# Patient Record
Sex: Male | Born: 1965 | Race: White | Hispanic: No | Marital: Married | Smoking: Never smoker
Health system: Southern US, Community
[De-identification: ages and names within clinical notes are randomized; demographics above are authoritative.]

## PROBLEM LIST (undated history)

## (undated) DIAGNOSIS — I10 Essential (primary) hypertension: Secondary | ICD-10-CM

## (undated) DIAGNOSIS — R569 Unspecified convulsions: Secondary | ICD-10-CM

## (undated) DIAGNOSIS — T6701XA Heatstroke and sunstroke, initial encounter: Secondary | ICD-10-CM

## (undated) DIAGNOSIS — Q231 Congenital insufficiency of aortic valve: Secondary | ICD-10-CM

## (undated) DIAGNOSIS — I4891 Unspecified atrial fibrillation: Secondary | ICD-10-CM

## (undated) DIAGNOSIS — C61 Malignant neoplasm of prostate: Secondary | ICD-10-CM

## (undated) DIAGNOSIS — E785 Hyperlipidemia, unspecified: Secondary | ICD-10-CM

## (undated) DIAGNOSIS — F329 Major depressive disorder, single episode, unspecified: Secondary | ICD-10-CM

## (undated) DIAGNOSIS — C801 Malignant (primary) neoplasm, unspecified: Secondary | ICD-10-CM

## (undated) DIAGNOSIS — F32A Depression, unspecified: Secondary | ICD-10-CM

## (undated) DIAGNOSIS — M1990 Unspecified osteoarthritis, unspecified site: Secondary | ICD-10-CM

## (undated) DIAGNOSIS — F419 Anxiety disorder, unspecified: Secondary | ICD-10-CM

## (undated) DIAGNOSIS — Q2381 Bicuspid aortic valve: Secondary | ICD-10-CM

## (undated) DIAGNOSIS — K219 Gastro-esophageal reflux disease without esophagitis: Secondary | ICD-10-CM

## (undated) HISTORY — DX: Anxiety disorder, unspecified: F41.9

## (undated) HISTORY — DX: Bicuspid aortic valve: Q23.81

## (undated) HISTORY — PX: OTHER SURGICAL HISTORY: SHX169

## (undated) HISTORY — DX: Essential (primary) hypertension: I10

## (undated) HISTORY — DX: Gastro-esophageal reflux disease without esophagitis: K21.9

## (undated) HISTORY — DX: Unspecified convulsions: R56.9

## (undated) HISTORY — DX: Major depressive disorder, single episode, unspecified: F32.9

## (undated) HISTORY — PX: TOTAL KNEE ARTHROPLASTY: SHX125

## (undated) HISTORY — DX: Unspecified atrial fibrillation: I48.91

## (undated) HISTORY — DX: Depression, unspecified: F32.A

## (undated) HISTORY — DX: Congenital insufficiency of aortic valve: Q23.1

## (undated) HISTORY — DX: Hyperlipidemia, unspecified: E78.5

## (undated) HISTORY — DX: Heatstroke and sunstroke, initial encounter: T67.01XA

## (undated) HISTORY — PX: JOINT REPLACEMENT: SHX530

## (undated) HISTORY — DX: Unspecified osteoarthritis, unspecified site: M19.90

---

## 1987-08-07 DIAGNOSIS — I4891 Unspecified atrial fibrillation: Secondary | ICD-10-CM

## 1987-08-07 HISTORY — PX: CARDIOVERSION: SHX1299

## 1987-08-07 HISTORY — DX: Unspecified atrial fibrillation: I48.91

## 1996-08-06 DIAGNOSIS — R569 Unspecified convulsions: Secondary | ICD-10-CM

## 1996-08-06 DIAGNOSIS — T6701XA Heatstroke and sunstroke, initial encounter: Secondary | ICD-10-CM

## 1996-08-06 HISTORY — DX: Unspecified convulsions: R56.9

## 1996-08-06 HISTORY — DX: Heatstroke and sunstroke, initial encounter: T67.01XA

## 2004-07-07 ENCOUNTER — Emergency Department (HOSPITAL_COMMUNITY): Admission: EM | Admit: 2004-07-07 | Discharge: 2004-07-07 | Payer: Self-pay | Admitting: Family Medicine

## 2004-09-14 ENCOUNTER — Ambulatory Visit: Payer: Self-pay | Admitting: Internal Medicine

## 2005-08-30 ENCOUNTER — Ambulatory Visit: Payer: Self-pay | Admitting: Internal Medicine

## 2006-05-14 ENCOUNTER — Ambulatory Visit: Payer: Self-pay | Admitting: Internal Medicine

## 2006-05-15 ENCOUNTER — Ambulatory Visit: Payer: Self-pay | Admitting: Internal Medicine

## 2006-05-21 ENCOUNTER — Ambulatory Visit: Payer: Self-pay | Admitting: Internal Medicine

## 2006-07-24 ENCOUNTER — Ambulatory Visit: Payer: Self-pay | Admitting: Internal Medicine

## 2006-12-23 ENCOUNTER — Telehealth (INDEPENDENT_AMBULATORY_CARE_PROVIDER_SITE_OTHER): Payer: Self-pay | Admitting: *Deleted

## 2007-03-07 ENCOUNTER — Telehealth: Payer: Self-pay | Admitting: Internal Medicine

## 2007-03-26 ENCOUNTER — Ambulatory Visit: Payer: Self-pay

## 2007-03-26 ENCOUNTER — Encounter: Payer: Self-pay | Admitting: Internal Medicine

## 2007-04-14 ENCOUNTER — Encounter: Payer: Self-pay | Admitting: Internal Medicine

## 2007-04-25 ENCOUNTER — Ambulatory Visit: Payer: Self-pay | Admitting: Internal Medicine

## 2007-04-25 DIAGNOSIS — M109 Gout, unspecified: Secondary | ICD-10-CM | POA: Insufficient documentation

## 2007-04-25 DIAGNOSIS — I10 Essential (primary) hypertension: Secondary | ICD-10-CM | POA: Insufficient documentation

## 2007-04-25 DIAGNOSIS — I4891 Unspecified atrial fibrillation: Secondary | ICD-10-CM | POA: Insufficient documentation

## 2007-08-04 DIAGNOSIS — E785 Hyperlipidemia, unspecified: Secondary | ICD-10-CM | POA: Insufficient documentation

## 2007-08-04 DIAGNOSIS — F39 Unspecified mood [affective] disorder: Secondary | ICD-10-CM | POA: Insufficient documentation

## 2007-08-13 ENCOUNTER — Ambulatory Visit: Payer: Self-pay | Admitting: Internal Medicine

## 2007-09-16 ENCOUNTER — Telehealth (INDEPENDENT_AMBULATORY_CARE_PROVIDER_SITE_OTHER): Payer: Self-pay | Admitting: *Deleted

## 2007-09-26 ENCOUNTER — Telehealth (INDEPENDENT_AMBULATORY_CARE_PROVIDER_SITE_OTHER): Payer: Self-pay | Admitting: *Deleted

## 2007-09-29 ENCOUNTER — Ambulatory Visit: Payer: Self-pay | Admitting: Internal Medicine

## 2008-08-11 ENCOUNTER — Ambulatory Visit: Payer: Self-pay | Admitting: Family Medicine

## 2008-10-08 ENCOUNTER — Telehealth: Payer: Self-pay | Admitting: Internal Medicine

## 2008-10-18 ENCOUNTER — Ambulatory Visit: Payer: Self-pay | Admitting: Family Medicine

## 2008-10-19 LAB — CONVERTED CEMR LAB
Albumin: 4.1 g/dL (ref 3.5–5.2)
H Pylori IgG: NEGATIVE
Lipase: 20 units/L (ref 11.0–59.0)

## 2008-10-22 ENCOUNTER — Encounter: Payer: Self-pay | Admitting: Family Medicine

## 2008-10-25 ENCOUNTER — Encounter: Payer: Self-pay | Admitting: Family Medicine

## 2008-10-25 ENCOUNTER — Ambulatory Visit: Payer: Self-pay | Admitting: Family Medicine

## 2008-10-29 ENCOUNTER — Ambulatory Visit: Payer: Self-pay | Admitting: Family Medicine

## 2008-11-04 ENCOUNTER — Ambulatory Visit: Payer: Self-pay | Admitting: Internal Medicine

## 2008-11-04 DIAGNOSIS — K7689 Other specified diseases of liver: Secondary | ICD-10-CM | POA: Insufficient documentation

## 2008-11-04 LAB — CONVERTED CEMR LAB
Albumin: 4.2 g/dL (ref 3.5–5.2)
Alkaline Phosphatase: 50 units/L (ref 39–117)
BUN: 16 mg/dL (ref 6–23)
CO2: 32 meq/L (ref 19–32)
Calcium: 9.6 mg/dL (ref 8.4–10.5)
Cholesterol: 276 mg/dL — ABNORMAL HIGH (ref 0–200)
Creatinine, Ser: 1.2 mg/dL (ref 0.4–1.5)
Glucose, Bld: 99 mg/dL (ref 70–99)
Hep B C IgM: NEGATIVE
Sodium: 140 meq/L (ref 135–145)
Total Protein: 7.6 g/dL (ref 6.0–8.3)
Triglycerides: 453 mg/dL — ABNORMAL HIGH (ref 0.0–149.0)

## 2008-11-05 ENCOUNTER — Inpatient Hospital Stay (HOSPITAL_COMMUNITY): Admission: EM | Admit: 2008-11-05 | Discharge: 2008-11-13 | Payer: Self-pay | Admitting: Nurse Practitioner

## 2008-11-08 ENCOUNTER — Telehealth: Payer: Self-pay | Admitting: Internal Medicine

## 2009-08-06 DIAGNOSIS — I428 Other cardiomyopathies: Secondary | ICD-10-CM | POA: Insufficient documentation

## 2009-08-07 ENCOUNTER — Inpatient Hospital Stay: Payer: Self-pay | Admitting: Internal Medicine

## 2009-08-07 ENCOUNTER — Ambulatory Visit: Payer: Self-pay | Admitting: Internal Medicine

## 2009-08-08 ENCOUNTER — Encounter: Payer: Self-pay | Admitting: Internal Medicine

## 2009-08-09 ENCOUNTER — Encounter: Payer: Self-pay | Admitting: Internal Medicine

## 2009-08-10 ENCOUNTER — Encounter: Payer: Self-pay | Admitting: Internal Medicine

## 2009-08-12 ENCOUNTER — Ambulatory Visit: Payer: Self-pay | Admitting: Internal Medicine

## 2009-08-12 LAB — CONVERTED CEMR LAB: INR: 1.2

## 2009-08-18 ENCOUNTER — Encounter: Payer: Self-pay | Admitting: Cardiology

## 2009-08-18 ENCOUNTER — Ambulatory Visit: Payer: Self-pay | Admitting: Internal Medicine

## 2009-08-19 ENCOUNTER — Encounter: Payer: Self-pay | Admitting: Internal Medicine

## 2009-08-19 LAB — CONVERTED CEMR LAB
INR: 1.21 (ref <1.50)
Prothrombin Time: 15.2 s

## 2009-08-22 ENCOUNTER — Telehealth: Payer: Self-pay | Admitting: Internal Medicine

## 2009-08-26 ENCOUNTER — Ambulatory Visit: Payer: Self-pay | Admitting: Internal Medicine

## 2009-09-05 ENCOUNTER — Encounter: Payer: Self-pay | Admitting: Internal Medicine

## 2009-09-05 ENCOUNTER — Ambulatory Visit: Payer: Self-pay | Admitting: Internal Medicine

## 2009-09-05 ENCOUNTER — Encounter: Payer: Self-pay | Admitting: Cardiovascular Disease

## 2009-09-05 LAB — CONVERTED CEMR LAB
BUN: 15 mg/dL (ref 6–23)
Chloride: 102 meq/L (ref 96–112)
Glucose, Bld: 90 mg/dL (ref 70–99)
INR: 1.16
Potassium: 4.4 meq/L (ref 3.5–5.3)

## 2009-09-08 ENCOUNTER — Ambulatory Visit: Payer: Self-pay | Admitting: Internal Medicine

## 2009-09-10 LAB — CONVERTED CEMR LAB
Albumin: 4.6 g/dL (ref 3.5–5.2)
Alkaline Phosphatase: 55 units/L (ref 39–117)
BUN: 16 mg/dL (ref 6–23)
Bilirubin, Direct: 0.1 mg/dL (ref 0.0–0.3)
Calcium: 9.6 mg/dL (ref 8.4–10.5)
Creatinine, Ser: 1.1 mg/dL (ref 0.4–1.5)
Glucose, Bld: 79 mg/dL (ref 70–99)
Sodium: 139 meq/L (ref 135–145)
Total Bilirubin: 1 mg/dL (ref 0.3–1.2)
Total CHOL/HDL Ratio: 5
VLDL: 59.6 mg/dL — ABNORMAL HIGH (ref 0.0–40.0)

## 2009-09-16 ENCOUNTER — Ambulatory Visit: Payer: Self-pay | Admitting: Cardiovascular Disease

## 2009-09-16 ENCOUNTER — Encounter: Payer: Self-pay | Admitting: Cardiovascular Disease

## 2009-09-22 ENCOUNTER — Ambulatory Visit: Payer: Self-pay | Admitting: Internal Medicine

## 2009-09-30 ENCOUNTER — Ambulatory Visit: Payer: Self-pay | Admitting: Cardiovascular Disease

## 2009-10-01 ENCOUNTER — Encounter: Payer: Self-pay | Admitting: Internal Medicine

## 2009-10-03 ENCOUNTER — Encounter: Payer: Self-pay | Admitting: Cardiovascular Disease

## 2009-10-04 ENCOUNTER — Ambulatory Visit: Payer: Self-pay

## 2009-10-04 LAB — CONVERTED CEMR LAB
INR: 1.53
Prothrombin Time: 18.3 s

## 2009-10-07 ENCOUNTER — Telehealth: Payer: Self-pay | Admitting: Internal Medicine

## 2009-10-10 ENCOUNTER — Ambulatory Visit: Payer: Self-pay | Admitting: Internal Medicine

## 2009-10-10 ENCOUNTER — Encounter: Payer: Self-pay | Admitting: Internal Medicine

## 2009-10-24 ENCOUNTER — Ambulatory Visit: Payer: Self-pay | Admitting: Internal Medicine

## 2009-10-24 ENCOUNTER — Encounter: Payer: Self-pay | Admitting: Cardiovascular Disease

## 2009-10-25 LAB — CONVERTED CEMR LAB
INR: 2.19 — ABNORMAL HIGH (ref <1.50)
Prothrombin Time: 24.2 s — ABNORMAL HIGH (ref 11.6–15.2)

## 2009-11-09 ENCOUNTER — Encounter: Payer: Self-pay | Admitting: Internal Medicine

## 2009-11-09 ENCOUNTER — Ambulatory Visit: Payer: Self-pay | Admitting: Cardiovascular Disease

## 2009-11-30 ENCOUNTER — Telehealth: Payer: Self-pay | Admitting: Internal Medicine

## 2009-12-12 ENCOUNTER — Ambulatory Visit: Payer: Self-pay | Admitting: Internal Medicine

## 2009-12-12 ENCOUNTER — Encounter: Payer: Self-pay | Admitting: Internal Medicine

## 2009-12-13 LAB — CONVERTED CEMR LAB
INR: 1.76 — ABNORMAL HIGH (ref <1.50)
POC INR: 1.76
POC INR: 1.76
Prothrombin Time: 20.4 s — ABNORMAL HIGH (ref 11.6–15.2)

## 2010-01-17 ENCOUNTER — Encounter: Payer: Self-pay | Admitting: Internal Medicine

## 2010-01-17 ENCOUNTER — Ambulatory Visit: Payer: Self-pay

## 2010-01-24 ENCOUNTER — Ambulatory Visit: Payer: Self-pay | Admitting: Internal Medicine

## 2010-01-24 DIAGNOSIS — R079 Chest pain, unspecified: Secondary | ICD-10-CM | POA: Insufficient documentation

## 2010-01-24 DIAGNOSIS — R061 Stridor: Secondary | ICD-10-CM | POA: Insufficient documentation

## 2010-01-25 ENCOUNTER — Encounter: Payer: Self-pay | Admitting: Cardiovascular Disease

## 2010-01-30 ENCOUNTER — Encounter: Payer: Self-pay | Admitting: Internal Medicine

## 2010-01-30 ENCOUNTER — Telehealth: Payer: Self-pay | Admitting: Cardiovascular Disease

## 2010-02-09 ENCOUNTER — Telehealth (INDEPENDENT_AMBULATORY_CARE_PROVIDER_SITE_OTHER): Payer: Self-pay | Admitting: *Deleted

## 2010-02-13 ENCOUNTER — Encounter: Payer: Self-pay | Admitting: Internal Medicine

## 2010-02-13 ENCOUNTER — Encounter: Payer: Self-pay | Admitting: Cardiology

## 2010-02-13 ENCOUNTER — Ambulatory Visit: Payer: Self-pay | Admitting: Cardiology

## 2010-02-13 ENCOUNTER — Ambulatory Visit: Payer: Self-pay

## 2010-02-13 ENCOUNTER — Encounter (HOSPITAL_COMMUNITY): Admission: RE | Admit: 2010-02-13 | Discharge: 2010-04-12 | Payer: Self-pay | Admitting: Internal Medicine

## 2010-02-20 ENCOUNTER — Ambulatory Visit: Payer: Self-pay | Admitting: Cardiovascular Disease

## 2010-03-01 ENCOUNTER — Encounter: Payer: Self-pay | Admitting: Cardiovascular Disease

## 2010-03-01 ENCOUNTER — Ambulatory Visit: Payer: Self-pay | Admitting: Internal Medicine

## 2010-03-02 LAB — CONVERTED CEMR LAB
BUN: 17 mg/dL (ref 6–23)
Creatinine, Ser: 1.09 mg/dL (ref 0.40–1.50)
Glucose, Bld: 85 mg/dL (ref 70–99)
HCT: 39.1 % (ref 39.0–52.0)
MCHC: 33.2 g/dL (ref 30.0–36.0)
MCV: 89.9 fL (ref 78.0–100.0)
Platelets: 221 10*3/uL (ref 150–400)
Potassium: 4.2 meq/L (ref 3.5–5.3)
Prothrombin Time: 19.4 s — ABNORMAL HIGH (ref 11.6–15.2)
RDW: 13.3 % (ref 11.5–15.5)

## 2010-03-06 ENCOUNTER — Inpatient Hospital Stay (HOSPITAL_BASED_OUTPATIENT_CLINIC_OR_DEPARTMENT_OTHER): Admission: RE | Admit: 2010-03-06 | Discharge: 2010-03-06 | Payer: Self-pay | Admitting: Cardiovascular Disease

## 2010-03-06 ENCOUNTER — Ambulatory Visit: Payer: Self-pay | Admitting: Cardiovascular Disease

## 2010-03-06 HISTORY — PX: CARDIAC CATHETERIZATION: SHX172

## 2010-03-15 ENCOUNTER — Ambulatory Visit: Payer: Self-pay | Admitting: Cardiovascular Disease

## 2010-03-15 LAB — CONVERTED CEMR LAB: POC INR: 1.7

## 2010-03-28 ENCOUNTER — Ambulatory Visit: Payer: Self-pay | Admitting: Internal Medicine

## 2010-03-28 ENCOUNTER — Ambulatory Visit: Payer: Self-pay | Admitting: Cardiovascular Disease

## 2010-03-28 LAB — CONVERTED CEMR LAB: POC INR: 1.6

## 2010-05-04 ENCOUNTER — Telehealth: Payer: Self-pay | Admitting: Internal Medicine

## 2010-06-13 ENCOUNTER — Ambulatory Visit: Payer: Self-pay | Admitting: Internal Medicine

## 2010-06-13 DIAGNOSIS — K219 Gastro-esophageal reflux disease without esophagitis: Secondary | ICD-10-CM | POA: Insufficient documentation

## 2010-06-15 LAB — CONVERTED CEMR LAB
ALT: 25 units/L (ref 0–53)
AST: 27 units/L (ref 0–37)
Albumin: 4 g/dL (ref 3.5–5.2)
BUN: 17 mg/dL (ref 6–23)
Basophils Absolute: 0 10*3/uL (ref 0.0–0.1)
Bilirubin, Direct: 0.1 mg/dL (ref 0.0–0.3)
Chloride: 104 meq/L (ref 96–112)
Creatinine, Ser: 1 mg/dL (ref 0.4–1.5)
Eosinophils Absolute: 0 10*3/uL (ref 0.0–0.7)
GFR calc non Af Amer: 91.66 mL/min (ref >60)
HCT: 37.7 % — ABNORMAL LOW (ref 39.0–52.0)
Hemoglobin: 13.3 g/dL (ref 13.0–17.0)
Lymphocytes Relative: 24.4 % (ref 12.0–46.0)
Lymphs Abs: 1.4 10*3/uL (ref 0.7–4.0)
MCHC: 35.2 g/dL (ref 30.0–36.0)
Neutro Abs: 3.9 10*3/uL (ref 1.4–7.7)
Phosphorus: 2.5 mg/dL (ref 2.3–4.6)
Platelets: 238 10*3/uL (ref 150.0–400.0)
RDW: 12.8 % (ref 11.5–14.6)
TSH: 2.18 microintl units/mL (ref 0.35–5.50)
Total Bilirubin: 0.8 mg/dL (ref 0.3–1.2)
Total Protein: 6.8 g/dL (ref 6.0–8.3)

## 2010-07-05 ENCOUNTER — Ambulatory Visit: Payer: Self-pay | Admitting: Cardiovascular Disease

## 2010-07-19 ENCOUNTER — Ambulatory Visit: Payer: Self-pay

## 2010-08-09 ENCOUNTER — Ambulatory Visit: Admission: RE | Admit: 2010-08-09 | Discharge: 2010-08-09 | Payer: Self-pay | Source: Home / Self Care

## 2010-08-09 ENCOUNTER — Ambulatory Visit
Admission: RE | Admit: 2010-08-09 | Discharge: 2010-08-09 | Payer: Self-pay | Source: Home / Self Care | Attending: Internal Medicine | Admitting: Internal Medicine

## 2010-08-09 ENCOUNTER — Encounter: Payer: Self-pay | Admitting: Internal Medicine

## 2010-08-09 LAB — CONVERTED CEMR LAB: POC INR: 1.3

## 2010-08-25 ENCOUNTER — Ambulatory Visit: Admission: RE | Admit: 2010-08-25 | Discharge: 2010-08-25 | Payer: Self-pay | Source: Home / Self Care

## 2010-08-25 LAB — CONVERTED CEMR LAB: POC INR: 2.5

## 2010-09-05 NOTE — Letter (Signed)
Summary: Custom - Delinquent Coumadin 1  [facility] at [address]   [place], [place] [address]   Phone: [phone]  Fax: [phone]     January 30, 2010 MRN: [id number]   [facility] [address] [place], [place]  [address]   Dear Mr. Jupiter,  This letter is being sent to you as a reminder that it is necessary for you to get your INR/PT checked regularly so that we can optimize your care.  Our records indicate that you were scheduled to have a test done recently.  As of today, we have not received the results of this test.  It is very important that you have your INR checked.  Please call our office at the number listed above to schedule an appointment at your earliest convenience.    If you have recently had your protime checked or have discontinued this medication, please contact our office at the above phone number to clarify this issue.  Thank you for this prompt attention to this important health care matter.  Sincerely,   [facility]ovascular [facility] Reduction [facility] Team

## 2010-09-05 NOTE — Medication Information (Signed)
Summary: CCR/AMD   Anticoagulant Therapy  Managed by: Charlena Cross, RN, BSN Referring MD: Graciela Husbands PCP: Cindee Salt MD Supervising MD: Tenny Craw MD, Gunnar Fusi Indication 1: Atrial Fibrillation INR RANGE 2.0-3.0           Allergies: No Known Drug Allergies  Anticoagulation Management History:      The patient is taking warfarin and comes in today for a routine follow up visit.  Negative risk factors for bleeding include an age less than 44 years old.  The bleeding index is 'low risk'.  Positive CHADS2 values include History of HTN.  Negative CHADS2 values include Age > 2 years old.  His last INR was 1.21 and today's INR is 1.2.  Anticoagulation responsible provider: Tenny Craw MD, Gunnar Fusi.    Anticoagulation Management Assessment/Plan:      The patient's current anticoagulation dose is Warfarin sodium 5 mg tabs: Use as directed by Anticoagulation Clinic.  The target INR is 2.0-3.0.  The next INR is due 09/06/2009.  Anticoagulation instructions were given to patient.  Results were reviewed/authorized by Charlena Cross, RN, BSN.  He was notified by Charlena Cross, RN, BSN.         Prior Anticoagulation Instructions: coumadin 7.5 mg daily with 5 mg on Tues Thurs Sat.  Current Anticoagulation Instructions: coumadin 7.5 mg daily.  Recheck in 10 days.

## 2010-09-05 NOTE — Assessment & Plan Note (Signed)
Summary: F3M/AMD  Medications Added WARFARIN SODIUM 2.5 MG TABS (WARFARIN SODIUM) take as directed      Allergies Added: NKDA  Visit Type:  Follow-up Primary Provider:  Cindee Salt MD  CC:  no cardiac complaints.  History of Present Illness: [name] is seen following a recent hospitalization for new onset atrial fibrillation at [facility]. At that time he underwent TEE guided cardioversion which was successful. His ejection fraction was 30% or less; there was concern about a bicuspid aortic valve.  A repeat ultrasound last week demonstrated marked improvement in his ejection fraction of 40-45%.  there was  NO evidence of bicuspid aortic valve.  he now comes in in 3 month followup with a recent echo which demonstrated no further improvement in his ejection fraction.  He notes a strong family history of sudden death with his father and grandfather in their early 5s both attributed to "heart attacks". However I should note that his father apparently had his heart rupture" in the setting of an abnormal heart rhythm. The patient does have some nonexertional chest discomfort. He does not have a history of GERD.  he has not lost further weight since the 20 pounds he lost at his last visit Is no longer  Current Medications (verified): 1)  Fluoxetine Hcl 20 Mg Caps (Fluoxetine Hcl) .... 3 Capsule Once A Day 2)  Multivitamins   Tabs (Multiple Vitamin) .... Take 1 Tablet By Mouth Once A Day 3)  Vitamin C 500 Mg  Tabs (Ascorbic Acid) .... Take 1 Tablet By Mouth Once A Day 4)  B-12 500 Mcg Tabs (Cyanocobalamin) .... Take One By Mouth Daily 5)  Lipitor 20 Mg Tabs (Atorvastatin Calcium) .... Take One Tablet By Mouth Daily. 6)  Warfarin Sodium 10 Mg Tabs (Warfarin Sodium) .... Use As Directed By Anticoagulation Clinic 7)  Carvedilol 6.25 Mg Tabs (Carvedilol) .... Take One Tablet By Mouth Twice A Day 8)  Lotensin 10 Mg Tabs (Benazepril Hcl) .... Take 1 Tab By Mouth Daily 9)  Colchicine 0.6 Mg  Tabs (Colchicine) .Marland Kitchen.. 1 By Mouth Once Daily 10)  Warfarin Sodium 2.5 Mg Tabs (Warfarin Sodium) .... Take As Directed  Allergies (verified): No Known Drug Allergies  Past History:  Past Medical History: Last updated: 08/18/2009 Atrial fibrillation--1989 cardiomyopathy ejection fraction 30% January 2011 Depression Gout Hypertension Bicuspid aortic valve--last echo looks trileaflet Hyperlipidemia  Past Surgical History: Last updated: 08/11/07 1990's      Heart, stroke / seizures 1989         Cardioversion  Family History: Last updated: January 31, 2010 Dad died of MI @50  Mom has DM, depression, arthritis No cancer in family CAD in paternal GF and uncles No HTN Brother: Crohn's disease  Social History: Last updated: 2007-08-11 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 HEENT  normal . Neck veins were flat; carotids brisk  and full without bruits. No lymphadenopathy. Back without kyphosis. Lungs clear. Heart sounds regular without murmurs or gallops. PMI nondisplaced. Abdomen soft with active bowel sounds without midline pulsation or hepatomegaly. Femoral pulses and distal pulses intact. Extremities were without clubbing cyanosis or edemaSkin warm and dry. Neurological exam grossly normal    Impression & Recommendations:  Problem # 1:  CHEST PAIN (ICD-786.50)  the patient is having atypical chest pains. In the context of his family  history and his non-resolving cardiomyopathy I think is important to undertake  Myoview scanning.  The following medications were removed from the medication list:    Warfarin Sodium 5 Mg Tabs (Warfarin sodium) .Marland Kitchen... Take as directed by coumadin clinic. His updated medication list for this problem includes:    Warfarin Sodium 10 Mg Tabs (Warfarin sodium) ..... Use as directed by anticoagulation clinic    Carvedilol 6.25 Mg Tabs (Carvedilol) .Marland Kitchen... Take one tablet by mouth twice a day    Lotensin 10 Mg Tabs (Benazepril hcl) .Marland Kitchen... Take 1 tab by mouth daily    Warfarin Sodium 2.5 Mg Tabs (Warfarin sodium) .Marland Kitchen... 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 Will plan to repeat a Myoview scan to see we can elucidate an ischemic contribution. The patient does note that he got very sick [place] with [place]; I wonder whether he might have had a virus that could have contributed to his cardiomyopathy. He also has strong family history of some kind of cardiomyopathic process. Will continue him on his current medications  Will settle lengthy discussion regarding exercise and weight loss. We'll plan to continue his exercise regime although with high rep;  low weight training as opposed to  anaerobic lifting.  We will neeed to check fasting lipids  Orders: Nuclear Stress Test (Nuc Stress Test)  Problem # 3:  ATRIAL FIBRILLATION (ICD-427.31)  the patient had some intercurrent palpitations. There has been no sustained arrhythmia. We had a very lengthy discussion regarding the potential benefits of Pradaxa compared to Coumadin. We will plan to begin this when he returns from his trip to [place] The following medications were removed from the medication list:    Warfarin Sodium 5 Mg Tabs  (Warfarin sodium) .Marland Kitchen... Take as directed by coumadin clinic. His updated medication list for this problem includes:    Warfarin Sodium 10 Mg Tabs (Warfarin sodium) ..... Use as directed by anticoagulation clinic    Carvedilol 6.25 Mg Tabs (Carvedilol) .Marland Kitchen... Take one tablet by mouth twice a day    Warfarin Sodium 2.5 Mg Tabs (Warfarin sodium) .Marland Kitchen... Take as directed  Orders: Nuclear Stress Test (Nuc Stress Test)  Patient Instructions: 1)  Your physician recommends that you continue on your current medications as directed. Please refer to the Current Medication list given to you today. 2)  Your physician has requested that you have an lexiscan myoview.  For further information please visit [email or web address]  Please follow instruction sheet, as given.

## 2010-09-05 NOTE — Cardiovascular Report (Signed)
Summary: Cardiac Cath Other  Cardiac Cath Other   Imported By: Frazier Butt Chriscoe 03/08/2010 15:42:13  _____________________________________________________________________  External Attachment:    Type:   Image     Comment:   External Document

## 2010-09-05 NOTE — Progress Notes (Signed)
Summary: [name]   Phone Note Call from Patient Call back at Home Phone [phone]   Caller: SELF Call For: KLEIN Summary of Call: WOULD LIKE A CALL BACK FROM MELISSA Initial call taken by: Harlon Flor,  November 30, 2009 4:46 PM  Follow-up for Phone Call        answered question about [facility] papers. Follow-up by: Charlena Cross, RN, BSN,  November 30, 2009 4:51 PM

## 2010-09-05 NOTE — Assessment & Plan Note (Signed)
Summary: NEP/AMD  Medications Added LIPITOR 20 MG TABS (ATORVASTATIN CALCIUM) Take one tablet by mouth daily. WARFARIN SODIUM 5 MG TABS (WARFARIN SODIUM) Use as directed by Anticoagulation Clinic LOVENOX 120 MG/0.8ML SOLN (ENOXAPARIN SODIUM) last dose this am METOPROLOL SUCCINATE 50 MG XR24H-TAB (METOPROLOL SUCCINATE) Take one tablet by mouth daily CARVEDILOL 6.25 MG TABS (CARVEDILOL) Take one tablet by mouth twice a day LOTENSIN 10 MG TABS (BENAZEPRIL HCL) take 1/2 tab by mouth daily      Allergies Added: NKDA  Visit Type:  new patient Primary Provider:  Cindee Salt MD  CC:  no complaints and some fatique.  History of Present Illness: [name] is seen in followup for atrial fibrillation associated with a rapid ventricular response and impaired left ventricular function. He underwent TEE guided cardioversion in the beginning of January with restoration of sinus rhythm.  He was discharged on Coumadin and treated also with Lovenox which he stopped this morning. Velocity with a beta blocker. This has been associated with aggravation of his PTSD sleep disturbance as well as significant fatigue.  He was not discharged on an ACE inhibitor because of low blood pressure in the hospital.  He is been back working out.  Current Problems (verified): 1)  Fatty Liver Disease  (ICD-571.8) 2)  Preventive Health Care  (ICD-V70.0) 3)  Hyperlipidemia  (ICD-272.4) 4)  Hypertension  (ICD-401.9) 5)  Gout  (ICD-274.9) 6)  Depression  (ICD-311) 7)  Atrial Fibrillation  (ICD-427.31) 8)  Bicuspid Aortic Valve  (ICD-746.4)  Current Medications (verified): 1)  Fluoxetine Hcl 20 Mg Caps (Fluoxetine Hcl) .... 3 Capsule Once A Day 2)  Multivitamins   Tabs (Multiple Vitamin) .... Take 1 Tablet By Mouth Once A Day 3)  Vitamin C 500 Mg  Tabs (Ascorbic Acid) .... Take 1 Tablet By Mouth Once A Day 4)  B-12 500 Mcg Tabs (Cyanocobalamin) .... Take One By Mouth Daily 5)  Lipitor 20 Mg Tabs (Atorvastatin  Calcium) .... Take One Tablet By Mouth Daily. 6)  Warfarin Sodium 5 Mg Tabs (Warfarin Sodium) .... Use As Directed By Anticoagulation Clinic 7)  Lovenox 120 Mg/0.38ml Soln (Enoxaparin Sodium) .... Last Dose This Am 8)  Metoprolol Succinate 50 Mg Xr24h-Tab (Metoprolol Succinate) .... Take One Tablet By Mouth Daily  Allergies (verified): No Known Drug Allergies  Past History:  Past Surgical History: Last updated: 08/04/2007 1990's      Heart, stroke / seizures 1989         Cardioversion  Past Medical History: Atrial fibrillation--1989 cardiomyopathy ejection fraction 30% January 2011 Depression Gout Hypertension Bicuspid aortic valve--last echo looks trileaflet Hyperlipidemia  Vital Signs:  Patient profile:   45 year old male Height:      69 inches Weight:      250.25 pounds BMI:     37.09 Pulse rate:   68 / minute Pulse rhythm:   regular BP sitting:   122 / 72  (right arm) Cuff size:   large  Vitals Entered By: Mercer Pod (August 18, 2009 9:25 AM)  Physical Exam  General:  The patient was alert and oriented in no acute distress. HEENT Normal.  Neck veins were flat, carotids were brisk.  Lungs were clear.  Heart sounds were regular without murmurs or gallops.  Abdomen was soft with active bowel sounds. There is no clubbing cyanosis. trace peripheral edema Skin Warm and dry exam was normal   EKG  Procedure date:  08/18/2009  Findings:      sinus rhythm at 62  Intervals 0.18/0.09/0.38 T Waves flattened in the laterally  Impression & Recommendations:  Problem # 1:  ATRIAL FIBRILLATION (ICD-427.31) he is status post cardioversion. He appears to be holding sinus rhythm. Is not yet clear whether the atrial fibrillation is primary or secondary to the cardiomyopathy. Given his cardiomyopathy however, anticoagulation for the short-term is appropriate.    Orders: T-Protime, Auto ([id number])  Problem # 2:  CARDIOMYOPATHY, PRIMARY, DILATED POSSIBLY  TACHYCARDIA BE (ICD-425.4)  With his cardiomyopathy, beta blockers and ACE inhibitors are appropriate. His blood pressures recovered we'll begin him on Lotensin at 5 mg a day. Given the intolerance and problems with the metoprolol succinate, we will change it and begin him on carvedilol 6.25 mg twice daily. We'll need to check a metabolic profile which we will do in 2 weeks time. Will plan to repeat his echo cardiogram in about 6 weeks. I will see him then.  We had a lengthy discussion comprising 50% of our 30 minute visit related to appropriate restrictions of pain to her and implications related to his cardiomyopathy. The following medications were removed from the medication list:    Metoprolol Succinate 50 Mg Xr24h-tab (Metoprolol succinate) .Marland Kitchen... Take one tablet by mouth daily His updated medication list for this problem includes:    Warfarin Sodium 5 Mg Tabs (Warfarin sodium) ..... Use as directed by anticoagulation clinic    Lovenox 120 Mg/0.72ml Soln (Enoxaparin sodium) .Marland Kitchen... Last dose this am    Carvedilol 6.25 Mg Tabs (Carvedilol) .Marland Kitchen... Take one tablet by mouth twice a day    Lotensin 10 Mg Tabs (Benazepril hcl) .Marland Kitchen... Take 1/2 tab by mouth daily  Orders: Echocardiogram (Echo)  Problem # 3:  BICUSPID AORTIC VALVE (ICD-746.4) we'll plan to clarify the status of his aortic valve at his next ultrasound. Recommendations regarding antibiotic prophylaxis will be forthcoming The following medications were removed from the medication list:    Metoprolol Succinate 50 Mg Xr24h-tab (Metoprolol succinate) .Marland Kitchen... Take one tablet by mouth daily His updated medication list for this problem includes:    Warfarin Sodium 5 Mg Tabs (Warfarin sodium) ..... Use as directed by anticoagulation clinic    Lovenox 120 Mg/0.52ml Soln (Enoxaparin sodium) .Marland Kitchen... Last dose this am    Carvedilol 6.25 Mg Tabs (Carvedilol) .Marland Kitchen... Take one tablet by mouth twice a day    Lotensin 10 Mg Tabs (Benazepril hcl) .Marland Kitchen... Take 1/2  tab by mouth daily  Patient Instructions: 1)  Your physician recommends that you schedule a follow-up appointment in: 7 weeks 2)  Your physician recommends that you return for lab work in: 2 weeks (bmet) 3)  Your physician has recommended you make the following change in your medication: stop Toprol, start Coreg 6.25 mg twice daily, start lotensin 5 mg daily 4)  Your physician has requested that you have an echocardiogram.  Echocardiography is a painless test that uses sound waves to create images of your heart. It provides your doctor with information about the size and shape of your heart and how well your heart's chambers and valves are working.  This procedure takes approximately one hour. There are no restrictions for this procedure. in 6 weeks. Prescriptions: LOTENSIN 10 MG TABS (BENAZEPRIL HCL) take 1/2 tab by mouth daily  #30 x 6   Entered by:   Charlena Cross, RN, BSN   Authorized by:   Nathen May, MD, [facility]   Signed by:   Charlena Cross, RN, BSN on 08/18/2009   Method used:   Electronically to  [facility] [address] [phone]* (retail)       [address] [place], [place]  [id number]       Ph: [phone]       Fax: [phone]   RxID:   [id number] CARVEDILOL 6.25 MG TABS (CARVEDILOL) Take one tablet by mouth twice a day  #60 x 6   Entered by:   Charlena Cross, RN, BSN   Authorized by:   Nathen May, MD, [facility]   Signed by:   Charlena Cross, RN, BSN on 08/18/2009   Method used:   Electronically to        [facility] [address] [phone]* (retail)       [address] [place], [place]  [id number]       Ph: [phone]       Fax: [phone]   RxID:   [id number]

## 2010-09-05 NOTE — Progress Notes (Signed)
Summary: QUESTIONS   Phone Note Call from Patient Call back at Home Phone [phone]   Caller: SELF Call For: [facility] Summary of Call: PT WOULD LIKE A CALL BACK-HAS QUESTIONS ABOUT MEDS Initial call taken by: Harlon Flor,  October 07, 2009 3:39 PM  Follow-up for Phone Call        insurance would not refill lotensin- placed rx for 1 tab daily even though pt is only taking 1/2 tab.  Follow-up by: Charlena Cross, RN, BSN,  October 07, 2009 3:48 PM

## 2010-09-05 NOTE — Letter (Signed)
[facility]   Imported By: Harlon Flor 08/11/2009 09:31:55  _____________________________________________________________________  External Attachment:    Type:   Image     Comment:   External Document  Appended Document: [facility] atrial fib cardioverted and back in sinus Has follow up with Dr Graciela Husbands

## 2010-09-05 NOTE — Assessment & Plan Note (Signed)
Summary: F7W/AMD      Allergies Added: NKDA  Visit Type:  Follow-up Primary Provider:  Cindee Salt MD  CC:  no complaints. .  History of Present Illness: [name] is seen following a recent hospitalization for new onset atrial fibrillation at [facility]. At that time he underwent TEE guided cardioversion which was successful. His ejection fraction was 30% or less; there was concern about a bicuspid aortic valve.  A repeat ultrasound last week demonstrated marked improvement in his ejection fraction of 40-45%.  there was  NO evidence of bicuspid aortic valve  He has lost intercurrently 20 pounds. He has been working out. He feels better than he has in years.  The patient denies SOB, chest pain, edema or palpitations  His snoring is less  Current Problems (verified): 1)  Cardiomyopathy, Primary, Dilated Possibly Tachycardia Be  (ICD-425.4) 2)  Fatty Liver Disease  (ICD-571.8) 3)  Preventive Health Care  (ICD-V70.0) 4)  Hyperlipidemia  (ICD-272.4) 5)  Hypertension  (ICD-401.9) 6)  Gout  (ICD-274.9) 7)  Depression  (ICD-311) 8)  Atrial Fibrillation  (ICD-427.31) 9)  Bicuspid Aortic Valve  (ICD-746.4)  Current Medications (verified): 1)  Fluoxetine Hcl 20 Mg Caps (Fluoxetine Hcl) .... 3 Capsule Once A Day 2)  Multivitamins   Tabs (Multiple Vitamin) .... Take 1 Tablet By Mouth Once A Day 3)  Vitamin C 500 Mg  Tabs (Ascorbic Acid) .... Take 1 Tablet By Mouth Once A Day 4)  B-12 500 Mcg Tabs (Cyanocobalamin) .... Take One By Mouth Daily 5)  Lipitor 20 Mg Tabs (Atorvastatin Calcium) .... Take One Tablet By Mouth Daily. 6)  Warfarin Sodium 10 Mg Tabs (Warfarin Sodium) .... Use As Directed By Anticoagulation Clinic 7)  Carvedilol 6.25 Mg Tabs (Carvedilol) .... Take One Tablet By Mouth Twice A Day 8)  Lotensin 10 Mg Tabs (Benazepril Hcl) .... Take 1 Tab By Mouth Daily 9)  Allopurinol 300 Mg Tabs (Allopurinol) .... Take 1 By Mouth Once Daily 10)  Colchicine 0.6 Mg Tabs (Colchicine)  .Marland Kitchen.. 1 By Mouth Once Daily  Allergies (verified): No Known Drug Allergies  Past History:  Past Medical History: Last updated: 08/18/2009 Atrial fibrillation--1989 cardiomyopathy ejection fraction 30% January 2011 Depression Gout Hypertension Bicuspid aortic valve--last echo looks trileaflet Hyperlipidemia  Past Surgical History: Last updated: 08/04/2007 1990's      Heart, stroke / seizures 1989         Cardioversion  Family History: Last updated: 11-10-2008 Dad died of MI @50  Mom has DM, depression, arthritis No cancer in family CAD in paternal GF and uncles No HTN Brother: Crohn's disease  Social History: Last updated: 08/04/2007 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 HEENT Normal.  Neck veins were flat, carotids were brisk.  Lungs were clear.  Heart sounds were regular without murmurs or gallops.  Abdomen was soft with active bowel sounds. There is no clubbing cyanosis or edema. 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 We will plan to reassess his ejection fraction in 3 months. We will continue him on his current medications. He is advised to  continue to lose weight. As relates to exercise we have encouraged that although not isometric work. His updated medication list for this problem includes:    Warfarin Sodium 10 Mg Tabs (Warfarin sodium) ..... Use as directed by anticoagulation clinic    Carvedilol 6.25 Mg Tabs (Carvedilol) .Marland Kitchen... Take one tablet by mouth twice a day    Lotensin 10 Mg Tabs (Benazepril hcl) .Marland Kitchen... Take 1 tab by mouth daily  Orders: Echocardiogram (Echo)  Problem # 2:  ATRIAL FIBRILLATION (ICD-427.31) There has been no further atrial fibrillation. I've instructed him on how to take his pulse. He is to alert his event that he has recurrence of irregularities.  In 3 months, in the event that his ejection fraction is normal, I would be inclined at that point to stop his Coumadin. We could wait for clinical recurrences. An alternative strategy would be to submit him for catheter ablation. However, I don't know that we gained anything by that approach in terms of long-term outcome benefits. If you would have recurrence of atrial fibrillation following normalization of LV function, I would be more inclined to pursue pulmonary vein isolation. His updated medication list for this problem includes:    Warfarin Sodium 10 Mg Tabs (Warfarin sodium) ..... Use as directed by anticoagulation clinic    Carvedilol 6.25 Mg Tabs (Carvedilol) .Marland Kitchen... Take one tablet by mouth twice a day  Problem # 3:  BICUSPID AORTIC VALVE (ICD-746.4)  repeat echo demonstrates there evidence of a tricuspid valve. His updated medication list for this problem includes:    Warfarin Sodium 10 Mg Tabs (Warfarin sodium) ..... Use as directed by anticoagulation clinic    Carvedilol 6.25 Mg Tabs (Carvedilol) .Marland Kitchen... Take one tablet by mouth twice a day    Lotensin 10 Mg Tabs (Benazepril hcl) .Marland Kitchen... Take 1 tab by mouth daily  Orders: Echocardiogram (Echo)

## 2010-09-05 NOTE — Medication Information (Signed)
Summary: Coumadin Clinic   Anticoagulant Therapy  Managed by: Charlena Cross, RN, BSN Referring MD: Graciela Husbands PCP: Cindee Salt MD Supervising MD: Graciela Husbands MD, Viviann Spare Indication 1: Atrial Fibrillation INR RANGE 2.0-3.0  Dietary changes: no    Health status changes: no    Bleeding/hemorrhagic complications: no    Recent/future hospitalizations: no    Any changes in medication regimen? no    Recent/future dental: no  Any missed doses?: yes     Details: on vacation last week- began to run out of coumadin.  only took 1 a day  Is patient compliant with meds? yes       Allergies: No Known Drug Allergies  Anticoagulation Management History:      His anticoagulation is being managed by telephone today.  Negative risk factors for bleeding include an age less than 1 years old.  The bleeding index is 'low risk'.  Positive CHADS2 values include History of HTN.  Negative CHADS2 values include Age > 79 years old.  His last INR was 1.2 and today's INR is 1.16.  Anticoagulation responsible provider: Graciela Husbands MD, Viviann Spare.    Anticoagulation Management Assessment/Plan:      The patient's current anticoagulation dose is Warfarin sodium 5 mg tabs: Use as directed by Anticoagulation Clinic.  The target INR is 2.0-3.0.  The next INR is due 09/16/2009.  Anticoagulation instructions were given to patient.  Results were reviewed/authorized by Charlena Cross, RN, BSN.  He was notified by Charlena Cross, RN, BSN.         Prior Anticoagulation Instructions: coumadin 7.5 mg daily.  Recheck in 10 days.   Current Anticoagulation Instructions: coumadin 10 mg today and tomorrow then resume coumadin 7.5 mg daily.  recheck in 7 days.

## 2010-09-05 NOTE — Progress Notes (Signed)
Summary: Nuclear Pre-Procedure  Phone Note Outgoing Call Call back at [facility] Phone [phone]   Call placed by: Stanton Kidney, EMT-P,  February 09, 2010 1:38 PM Call placed to: Patient Action Taken: Phone Call Completed Summary of Call: Reviewed information on [employer] Information Sheet (see scanned document for further details).  Spoke with Patient.    Nuclear Med Background Indications for Stress Test: Evaluation for Ischemia   History: Cardioversion, Echo  History Comments: 1/11 Cardioversion: A. Fib 1/11 Echo (TEE): EF=30%, (?)bicuspid aortic valve  Symptoms: Chest Pain, Palpitations    Nuclear Pre-Procedure Cardiac Risk Factors: Family History - CAD, Hypertension, Lipids Height (in): 69  Nuclear Med Study Referring MD:  Graciela Husbands

## 2010-09-05 NOTE — Medication Information (Signed)
Summary: Coumadin Clinic   Anticoagulant Therapy  Managed by: Charlena Cross, RN, BSN Referring MD: Graciela Husbands PCP: Cindee Salt MD Supervising MD: Graciela Husbands MD, Viviann Spare Indication 1: Atrial Fibrillation INR POC 2.6 INR RANGE 2.0-3.0  Dietary changes: no    Health status changes: no    Bleeding/hemorrhagic complications: no    Recent/future hospitalizations: no    Any changes in medication regimen? no    Recent/future dental: no  Any missed doses?: no       Is patient compliant with meds? yes       Allergies: No Known Drug Allergies  Anticoagulation Management History:      The patient is taking warfarin and comes in today for a routine follow up visit.  Negative risk factors for bleeding include an age less than 69 years old.  The bleeding index is 'low risk'.  Positive CHADS2 values include History of HTN.  Negative CHADS2 values include Age > 34 years old.  His last INR was 1.53.  Anticoagulation responsible provider: Graciela Husbands MD, Viviann Spare.  INR POC: 2.6.    Anticoagulation Management Assessment/Plan:      The patient's current anticoagulation dose is Warfarin sodium 10 mg tabs: Use as directed by Anticoagulation Clinic.  The target INR is 2.0-3.0.  The next INR is due 10/24/2009.  Anticoagulation instructions were given to patient.  Results were reviewed/authorized by Charlena Cross, RN, BSN.  He was notified by Charlena Cross, RN, BSN.         Prior Anticoagulation Instructions: coumadin 10 mg daily  Current Anticoagulation Instructions: The patient is to continue with the same dose of coumadin.  This dosage includes: couamdin 10 mg daily with 12.5 mg on M W F

## 2010-09-05 NOTE — Medication Information (Signed)
Summary: Coumadin Clinic   Anticoagulant Therapy  Managed by: Charlena Cross, RN, BSN Referring MD: Graciela Husbands PCP: Cindee Salt MD Supervising MD: Tenny Craw MD, Gunnar Fusi Indication 1: Atrial Fibrillation PT 15.2 INR RANGE 2.0-3.0  Dietary changes: no    Health status changes: no    Bleeding/hemorrhagic complications: no    Recent/future hospitalizations: no    Any changes in medication regimen? no    Recent/future dental: no  Any missed doses?: no       Is patient compliant with meds? yes       Allergies: No Known Drug Allergies  Anticoagulation Management History:      The patient is taking warfarin and comes in today for a routine follow up visit.  Negative risk factors for bleeding include an age less than 98 years old.  The bleeding index is 'low risk'.  Positive CHADS2 values include History of HTN.  Negative CHADS2 values include Age > 46 years old.  His last INR was 1.21 and today's INR is 1.21.  Prothrombin time is 15.2.  Anticoagulation responsible provider: Tenny Craw MD, Gunnar Fusi.    Anticoagulation Management Assessment/Plan:      The patient's current anticoagulation dose is Warfarin sodium 5 mg tabs: Use as directed by Anticoagulation Clinic.  The target INR is 2.0-3.0.  The next INR is due 08/26/2009.  Results were reviewed/authorized by Charlena Cross, RN, BSN.  He was notified by Charlena Cross, RN, BSN.         Prior Anticoagulation Instructions: coumadin 5 mg daily with 7.5 mg on Monday and Friday. Continue on Lovenox.  Current Anticoagulation Instructions: coumadin 7.5 mg daily with 5 mg on Tues Thurs Sat.

## 2010-09-05 NOTE — Letter (Signed)
Summary: Cardiac Catheterization Instructions- [facility]  [facility] at [address]   [place], [place] [address]   Phone: [phone]  Fax: [phone]     03/01/2010 MRN: [id number]  [facility] [address] [place], [place]  [address]  Dear Mr. Sarra,   You are scheduled for a Cardiac Catheterization on August1, 2011 with Dr.McAlhaney  Please arrive to the 1st floor of the Heart and Vascular Center at [facility] at 6:30am on the day of your procedure. Please do not arrive before 6:30 a.m. Call the Heart and Vascular Center at [phone] if you are unable to make your appointmnet. The Code to get into the parking garage under the building is 0002. Take the elevators to the 1st floor. You must have someone to drive you home. Someone must be with you for the first 24 hours after you arrive home. Please wear clothes that are easy to get on and off and wear slip-on shoes. Do not eat or drink after midnight except water with your medications that morning. Bring all your medications and current insurance cards with you.  _x__ DO NOT take these medications before your procedure:   Stop taking Coumadin March 01, 2010. _x__ Make sure you take your aspirin.  _x__ You may take ALL of your medications with water that morning.  The usual length of stay after your procedure is 2 to 3 hours. This can vary.  If you have any questions, please call the office at the number listed above.   Benedict Needy, RN

## 2010-09-05 NOTE — Medication Information (Signed)
Summary: CCR/AMD  Medications Added COLCHICINE 0.6 MG TABS (COLCHICINE) 1 by mouth once daily      Allergies Added: NKDA Anticoagulant Therapy  Managed by: Charlena Cross, RN, BSN Referring MD: Graciela Husbands PCP: Cindee Salt MD Supervising MD: Mariah Milling Indication 1: Atrial Fibrillation INR RANGE 2.0-3.0  Dietary changes: no    Health status changes: no    Bleeding/hemorrhagic complications: no    Recent/future hospitalizations: no    Any changes in medication regimen? no    Recent/future dental: no  Any missed doses?: no       Is patient compliant with meds? yes       Current Medications (verified): 1)  Fluoxetine Hcl 20 Mg Caps (Fluoxetine Hcl) .... 3 Capsule Once A Day 2)  Multivitamins   Tabs (Multiple Vitamin) .... Take 1 Tablet By Mouth Once A Day 3)  Vitamin C 500 Mg  Tabs (Ascorbic Acid) .... Take 1 Tablet By Mouth Once A Day 4)  B-12 500 Mcg Tabs (Cyanocobalamin) .... Take One By Mouth Daily 5)  Lipitor 20 Mg Tabs (Atorvastatin Calcium) .... Take One Tablet By Mouth Daily. 6)  Warfarin Sodium 5 Mg Tabs (Warfarin Sodium) .... Use As Directed By Anticoagulation Clinic 7)  Carvedilol 6.25 Mg Tabs (Carvedilol) .... Take One Tablet By Mouth Twice A Day 8)  Lotensin 10 Mg Tabs (Benazepril Hcl) .... Take 1/2 Tab By Mouth Daily 9)  Allopurinol 300 Mg Tabs (Allopurinol) .... Take 1 By Mouth Once Daily 10)  Colchicine 0.6 Mg Tabs (Colchicine) .Marland Kitchen.. 1 By Mouth Once Daily  Allergies (verified): No Known Drug Allergies  Anticoagulation Management History:      Negative risk factors for bleeding include an age less than 45 years old.  The bleeding index is 'low risk'.  Positive CHADS2 values include History of HTN.  Negative CHADS2 values include Age > 22 years old.  His last INR was 1.16.  Anticoagulation responsible provider: gollan.    Anticoagulation Management Assessment/Plan:      The patient's current anticoagulation dose is Warfarin sodium 5 mg tabs: Use as  directed by Anticoagulation Clinic.  The target INR is 2.0-3.0.  The next INR is due 09/30/2009.  Anticoagulation instructions were given to patient.  Results were reviewed/authorized by Charlena Cross, RN, BSN.  He was notified by Charlena Cross, RN, BSN.         Prior Anticoagulation Instructions: Coumadin 10mg  MWF with coumadin 7.5 mg T TH Sa Sun  Current Anticoagulation Instructions: coumadin 10 mg daily

## 2010-09-05 NOTE — Assessment & Plan Note (Signed)
Summary: 6 MTH F/U/CLE   Vital Signs:  Patient profile:   45 year old male Weight:      218 pounds Temp:     98.4 degrees F oral Pulse rate:   72 / minute Pulse rhythm:   regular BP sitting:   102 / 60  (left arm) Cuff size:   large  Vitals Entered By: Mervin Hack CMA Duncan Dull) (June 13, 2010 9:09 AM) CC: 6 month follow-up   History of Present Illness: Doing okay did have cath in August--no sig CAD and EF ~40%  Works out regularly Has been watching diet now  Still has brief paroxysms of atrial fibrillation---seems to be only for seconds No SOB Still has slight chest pressure at times--generally not with exertion, just at rest  Has started prilosec recently due to worsened heartburn Notes more gas --may be worse on the medicine No sig lactose No sugarfree candies  On lipitor close to a year ??related to gas  Has a hard time relaxing Sunday is his hardest day when he has nothing to do Occ spells of depressed mood but nothing persistent  Allergies: No Known Drug Allergies  Past History:  Past medical, surgical, family and social histories (including risk factors) reviewed for relevance to current acute and chronic problems.  Past Medical History: Atrial fibrillation--1989 cardiomyopathy ejection fraction 30% January 2011 Depression Gout Hypertension Bicuspid aortic valve--last echo looks trileaflet Hyperlipidemia GERD  Past Surgical History: 1990's      Heat stroke / seizures 1989         Cardioversion 8/11 Cath--no sig coronary disease. EF ~40%  Family History: Reviewed history from 01/24/2010 and no changes required. Dad died of MI @50  Mom has DM, depression, arthritis No cancer in family CAD in paternal GF and uncles No HTN Brother: Crohn's disease  Social History: Reviewed history from 08/04/2007 and no changes required. Occupation: Estate manager/land agent @ [employer] Married--1 son Never Smoked Alcohol use-yes  Review of Systems       weight  down  ~25# since February Sleep is not great---up multiple times. Nocturia x 1 usually No sig daytime somnolence  Physical Exam  General:  alert and normal appearance.   Neck:  supple, no masses, no thyromegaly, no carotid bruits, and no cervical lymphadenopathy.   Lungs:  normal respiratory effort, no intercostal retractions, no accessory muscle use, and normal breath sounds.   Heart:  normal rate, regular rhythm, no murmur, and no gallop.   Abdomen:  soft and non-tender.   Extremities:  no edema Psych:  normally interactive, good eye contact, not anxious appearing, and not depressed appearing.     Impression & Recommendations:  Problem # 1:  GERD (ICD-530.81) Assessment New now has more symptoms will cut out vitamins, then trial off lipitor if gas persists continue prilosec  The following medications were removed from the medication list:    Pepcid 20 Mg Tabs (Famotidine) ..... Once daily for 4 weeks His updated medication list for this problem includes:    Prilosec Otc 20 Mg Tbec (Omeprazole magnesium) .Marland Kitchen... 1 tab daily on an empty stomach  Problem # 2:  DEPRESSION (ICD-311) Assessment: Unchanged ongoing "motor running" will continue the med  His updated medication list for this problem includes:    Fluoxetine Hcl 20 Mg Caps (Fluoxetine hcl) .Marland KitchenMarland KitchenMarland KitchenMarland Kitchen 3 capsule once a day  Problem # 3:  ATRIAL FIBRILLATION (ICD-427.31) Assessment: Comment Only seems to maintain sinus mostly  His updated medication list for this problem includes:  Carvedilol 12.5 Mg Tabs (Carvedilol) .Marland Kitchen... As directed    Warfarin Sodium 10 Mg Tabs (Warfarin sodium) ..... Use as directed by anticoagulation clinic    Warfarin Sodium 2.5 Mg Tabs (Warfarin sodium) .Marland Kitchen... Take as directed  Problem # 4:  HYPERTENSION (ICD-401.9) Assessment: Unchanged  good control on meds for cardiomyopathy  His updated medication list for this problem includes:    Carvedilol 12.5 Mg Tabs (Carvedilol) .Marland Kitchen... As directed     Lotensin 10 Mg Tabs (Benazepril hcl) .Marland Kitchen... Take 1 tab by mouth daily  BP today: 102/60 Prior BP: 118/74 (03/28/2010)  Labs Reviewed: K+: 4.2 (03/01/2010) Creat: : 1.09 (03/01/2010)   Chol: 201 (09/08/2009)   HDL: 43.50 (09/08/2009)   TG: 298.0 (09/08/2009)  Orders: TLB-Renal Function Panel (80069-RENAL) TLB-CBC Platelet - w/Differential (85025-CBCD) TLB-TSH (Thyroid Stimulating Hormone) (84443-TSH)  Problem # 5:  HYPERLIPIDEMIA (ICD-272.4) Assessment: Comment Only  will consider change if this is causing his gas  His updated medication list for this problem includes:    Lipitor 20 Mg Tabs (Atorvastatin calcium) .Marland Kitchen... Take one tablet by mouth daily.  Labs Reviewed: SGOT: 30 (09/08/2009)   SGPT: 38 (09/08/2009)   HDL:43.50 (09/08/2009), 34.80 (10/29/2008)  Chol:201 (09/08/2009), 276 (10/29/2008)  Trig:298.0 (09/08/2009), 453.0 (10/29/2008)  Orders: TLB-Lipid Panel (80061-LIPID) TLB-Hepatic/Liver Function Pnl (80076-HEPATIC) Venipuncture (16109)  Complete Medication List: 1)  Carvedilol 12.5 Mg Tabs (Carvedilol) .... As directed 2)  Warfarin Sodium 10 Mg Tabs (Warfarin sodium) .... Use as directed by anticoagulation clinic 3)  Warfarin Sodium 2.5 Mg Tabs (Warfarin sodium) .... Take as directed 4)  Lipitor 20 Mg Tabs (Atorvastatin calcium) .... Take one tablet by mouth daily. 5)  Fluoxetine Hcl 20 Mg Caps (Fluoxetine hcl) .... 3 capsule once a day 6)  Lotensin 10 Mg Tabs (Benazepril hcl) .... Take 1 tab by mouth daily 7)  Colchicine 0.6 Mg Tabs (Colchicine) .Marland Kitchen.. 1 by mouth as needed 8)  Prilosec Otc 20 Mg Tbec (Omeprazole magnesium) .Marland Kitchen.. 1 tab daily on an empty stomach  Patient Instructions: 1)  Please stop all your vitamins 2)  If the gas is no better after a couple of weeks, try off the lipitor to see if that helps (if it doesn't, restart--if it does, call) 3)  Please schedule a follow-up appointment in 6 months for physical   Orders Added: 1)  Est. Patient Level IV  [60454] 2)  TLB-Renal Function Panel [80069-RENAL] 3)  TLB-CBC Platelet - w/Differential [85025-CBCD] 4)  TLB-TSH (Thyroid Stimulating Hormone) [84443-TSH] 5)  TLB-Lipid Panel [80061-LIPID] 6)  TLB-Hepatic/Liver Function Pnl [80076-HEPATIC] 7)  Venipuncture [09811]    Current Allergies (reviewed today): No known allergies   Appended Document: 6 MTH F/U/CLE     Clinical Lists Changes  Orders: Added new Service order of Pneumococcal Vaccine (91478) - Signed Added new Service order of Admin 1st Vaccine (29562) - Signed Added new Service order of Admin 1st Vaccine [facility]) [phone]) - Signed Added new Service order of Flu Vaccine 21yrs + (78469) - Signed Added new Service order of Admin of Any Addtl Vaccine (62952) - Signed Added new Service order of Admin of Any Addtl Vaccine (State) [phone]) - Signed Observations: Added new observation of FLU VAX#1VIS: 02/28/10 version given June 13, 2010. (06/13/2010 10:11) Added new observation of FLU VAXLOT: [id number] (06/13/2010 10:11) Added new observation of FLU VAX EXP: 02/03/2011 (06/13/2010 10:11) Added new observation of FLU VAXBY: DeShannon Smith CMA (AAMA) (06/13/2010 10:11) Added new observation of FLU VAXRTE: IM (06/13/2010 10:11) Added new observation of FLU VAX  DSE: 0.5 ml (06/13/2010 10:11) Added new observation of FLU VAXMFR: GlaxoSmithKline (06/13/2010 10:11) Added new observation of FLU VAX SITE: left deltoid (06/13/2010 10:11) Added new observation of FLU VAX: Fluvax 3+ (06/13/2010 10:11) Added new observation of PNEUMOVAXVIS: 07/11/09 version given June 13, 2010. (06/13/2010 10:11) Added new observation of PNEUMOVAXLOT: 1258aa (06/13/2010 10:11) Added new observation of PNEUMOVAXEXP: 11/28/2011 (06/13/2010 10:11) Added new observation of PNEUMOVAXBY: DeShannon Smith CMA (AAMA) (06/13/2010 10:11) Added new observation of PNEUMOVAXRTE: IM (06/13/2010 10:11) Added new observation of PNEUMOVAXMFR: Merck (06/13/2010  10:11) Added new observation of PNEUMOVAXSIT: right deltoid (06/13/2010 10:11) Added new observation of PNEUMOVAX: Pneumovax (06/13/2010 10:11)       Pneumovax Vaccine    Vaccine Type: Pneumovax    Site: right deltoid    Mfr: Merck    Dose: 0.5 ml    Route: IM    Given by: Mervin Hack CMA (AAMA)    Exp. Date: 11/28/2011    Lot #: [id number]    VIS given: 07/11/09 version given June 13, 2010.  Influenza Vaccine    Vaccine Type: Fluvax 3+    Site: left deltoid    Mfr: GlaxoSmithKline    Dose: 0.5 ml    Route: IM    Given by: Mervin Hack CMA (AAMA)    Exp. Date: 02/03/2011    Lot #: [id number]    VIS given: 02/28/10 version given June 13, 2010.  Flu Vaccine Consent Questions    Do you have a history of severe allergic reactions to this vaccine? no    Any prior history of allergic reactions to egg and/or gelatin? no    Do you have a sensitivity to the preservative Thimersol? no    Do you have a past history of Guillan-Barre Syndrome? no    Do you currently have an acute febrile illness? no    Have you ever had a severe reaction to latex? no    Vaccine information given and explained to patient? yes

## 2010-09-05 NOTE — Medication Information (Signed)
Summary: CCR/AMD   Anticoagulant Therapy  Managed by: Charlena Cross, RN, BSN Referring MD: Graciela Husbands PCP: Pam Drown MD: Shirlee Latch MD, Astin Sayre Indication 1: Atrial Fibrillation INR POC 1.2 INR RANGE 2.0-3.0  Dietary changes: no    Health status changes: no    Bleeding/hemorrhagic complications: no    Recent/future hospitalizations: no    Any changes in medication regimen? no    Recent/future dental: no  Any missed doses?: no       Is patient compliant with meds? yes       Allergies: No Known Drug Allergies  Anticoagulation Management History:      The patient is taking warfarin and comes in today for a routine follow up visit.  Negative risk factors for bleeding include an age less than 36 years old.  The bleeding index is 'low risk'.  Positive CHADS2 values include History of HTN.  Negative CHADS2 values include Age > 72 years old.  Today's INR is 1.2.  Anticoagulation responsible provider: Shirlee Latch MD, Amelia Macken.  INR POC: 1.2.    Anticoagulation Management Assessment/Plan:      The next INR is due 08/19/2009.  Results were reviewed/authorized by Charlena Cross, RN, BSN.  He was notified by Charlena Cross, RN, BSN.         Current Anticoagulation Instructions: coumadin 5 mg daily with 7.5 mg on Monday and Friday. Continue on Lovenox.

## 2010-09-05 NOTE — Medication Information (Signed)
Summary: rov/ewj   Anticoagulant Therapy  Managed by: Weston Brass, PharmD Referring MD: Graciela Husbands PCP: Cindee Salt MD Supervising MD: Clifton James MD, Cristal Deer Indication 1: Atrial Fibrillation Lab Used: [facility] [place] Site: [facility] INR POC 1.6 INR RANGE 2.0-3.0  Dietary changes: no    Health status changes: no    Bleeding/hemorrhagic complications: no    Recent/future hospitalizations: no    Any changes in medication regimen? yes       Details: Pt started taking CoQ10.   Recent/future dental: no  Any missed doses?: yes     Details: Might of missed a dose last week when he fell asleep before he remembered to take it.    Is patient compliant with meds? yes       Allergies: No Known Drug Allergies  Anticoagulation Management History:      The patient is taking warfarin and comes in today for a routine follow up visit.  Negative risk factors for bleeding include an age less than 99 years old.  The bleeding index is 'low risk'.  Positive CHADS2 values include History of HTN.  Negative CHADS2 values include Age > 43 years old.  His last INR was 1.62.  Anticoagulation responsible provider: Clifton James MD, Cristal Deer.  INR POC: 1.6.  Cuvette Lot#: [id number].  Exp: 05/2010.    Anticoagulation Management Assessment/Plan:      The patient's current anticoagulation dose is Warfarin sodium 10 mg tabs: Use as directed by Anticoagulation Clinic, Warfarin sodium 2.5 mg tabs: take as directed.  The target INR is 2.0-3.0.  The next INR is due 04/11/2010.  Anticoagulation instructions were given to patient.  Results were reviewed/authorized by Weston Brass, PharmD.  He was notified by Gweneth Fritter, PharmD Candidate.         Prior Anticoagulation Instructions: INR 1.7  Take 15mg  today, then resume 10mg  daily.  Recheck in [place] after Dr Graciela Husbands appt.   Current Anticoagulation Instructions: INR 1.6  Take 10 mg every day except take 15 mg every Tuesday.  Recheck in 2 weeks.

## 2010-09-05 NOTE — Assessment & Plan Note (Signed)
Summary: F/U   Vital Signs:  Patient profile:   45 year old male Weight:      245 pounds Temp:     98.4 degrees F oral Pulse rate:   60 / minute Pulse rhythm:   regular BP sitting:   102 / 68  (left arm) Cuff size:   large  Vitals Entered By: Mervin Hack CMA Duncan Dull) (September 08, 2009 4:42 PM) CC: follow-up visit   History of Present Illness: Felt pounding in chest about 3 weeks ago and was back in atrial fib had TEE then cardioversion Back in sinus Occ feels it go irregular for a few seconds  Feels really good now Has been watching caffeine weight down 5# Trying to start some cardio work outs Being careful with eating  Seeing Dr Charlsie Merles as well Has had some gout---last in ankle He did cortisone shot Colchicine didn't knock it out all the time--so got shot and steroids  found to have high cholesterol started on lipitor  Allergies: No Known Drug Allergies  Past History:  Past medical, surgical, family and social histories (including risk factors) reviewed for relevance to current acute and chronic problems.  Past Medical History: Reviewed history from 08/18/2009 and no changes required. Atrial fibrillation--1989 cardiomyopathy ejection fraction 30% January 2011 Depression Gout Hypertension Bicuspid aortic valve--last echo looks trileaflet Hyperlipidemia  Past Surgical History: Reviewed history from 08/04/2007 and no changes required. 1990's      Heart, stroke / seizures 1989         Cardioversion  Family History: Reviewed history from 11/04/2008 and no changes required. Dad died of MI @50  Mom has DM, depression, arthritis No cancer in family CAD in paternal GF and uncles No HTN Brother: Crohn's disease  Social History: Reviewed history from 08/04/2007 and no changes required. 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   Neck:  supple, no masses, no thyromegaly, no carotid bruits, and no cervical lymphadenopathy.   Lungs:  normal respiratory effort and normal breath sounds.   Heart:  normal rate, regular rhythm, no murmur, and no gallop.   Extremities:  no edema Psych:  normally interactive, good eye contact, not depressed appearing, and slightly anxious.     Impression & Recommendations:  Problem # 1:  ATRIAL FIBRILLATION (ICD-427.31) Assessment Unchanged regular now Dr Graciela Husbands is managing  His updated medication list for this problem includes:    Warfarin Sodium 5 Mg Tabs (Warfarin sodium) ..... Use as directed by anticoagulation clinic    Carvedilol 6.25 Mg Tabs (Carvedilol) .Marland Kitchen... 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 I advised caution and graded increase  Problem # 3:  GOUT (ICD-274.9) Assessment: Deteriorated worse lately will check uric acid---start allopurinol if elevated  Orders: TLB-Renal Function Panel (80069-RENAL) Venipuncture (16109) TLB-Uric Acid, Blood (84550-URIC)  Complete Medication List: 1)  Fluoxetine Hcl 20 Mg Caps (Fluoxetine hcl) .... 3 capsule once a day 2)  Multivitamins Tabs (Multiple vitamin) .Marland KitchenMarland KitchenMarland Kitchen  Take 1 tablet by mouth once a day 3)  Vitamin C 500 Mg Tabs (Ascorbic acid) .... Take 1 tablet by mouth once a day 4)  B-12 500 Mcg Tabs (Cyanocobalamin) .... Take one by mouth daily 5)  Lipitor 20 Mg Tabs (Atorvastatin calcium) .... Take one tablet by mouth daily. 6)  Warfarin Sodium 5 Mg Tabs (Warfarin sodium) .... Use as directed by  anticoagulation clinic 7)  Carvedilol 6.25 Mg Tabs (Carvedilol) .... Take one tablet by mouth twice a day 8)  Lotensin 10 Mg Tabs (Benazepril hcl) .... Take 1/2 tab by mouth daily  Other Orders: TLB-Lipid Panel (80061-LIPID) TLB-Hepatic/Liver Function Pnl (80076-HEPATIC)  Patient Instructions: 1)  Please schedule a follow-up appointment in 4 months .   Current Allergies (reviewed today): No known allergies

## 2010-09-05 NOTE — Progress Notes (Signed)
Summary: Overdue for coumadin follow-up.  Phone Note Outgoing Call   Call placed by: Cloyde Reams RN,  January 30, 2010 12:17 PM Call placed to: Patient Summary of Call: Pt is overdue for coumadin follow-up.  Last PT/INR done on 12/12/09, pt was due for recheck 01/03/10.  Attempted to call pt, LMOM TCB to schedule f/u VM states pt will be unavailable from 6/25-02/07/10.  Will send delinquent letter as well. Initial call taken by: Cloyde Reams RN,  January 30, 2010 12:18 PM

## 2010-09-05 NOTE — Assessment & Plan Note (Signed)
Summary: Cardiology Nuclear Study  Nuclear Med Background Indications for Stress Test: Evaluation for Ischemia   History: Cardioversion, Echo  History Comments: 1/11 Cardioversion: A. Fib 1/11 Echo (TEE): EF=30%, (?)bicuspid aortic valve  Symptoms: Chest Pain, Palpitations    Nuclear Pre-Procedure Cardiac Risk Factors: Family History - CAD, Hypertension, Lipids Caffeine/Decaff Intake: None NPO After: 7:00 PM Lungs: clear IV 0.9% NS with Angio Cath: 20g     IV Site: (L) AC IV Started by: Irean Hong RN Chest Size (in) 46     Height (in): 69 Weight (lb): 230 BMI: 34.09 Tech Comments: Carvedilol 7 pm last night.  Nuclear Med Study 1 or 2 day study:  1 day     Stress Test Type:  Eugenie Birks Reading MD:  Marca Ancona, MD     Referring MD:  Graciela Husbands Resting Radionuclide:  Technetium 70m Tetrofosmin     Resting Radionuclide Dose:  11 mCi  Stress Radionuclide:  Technetium 52m Tetrofosmin     Stress Radionuclide Dose:  33 mCi   Stress Protocol   Lexiscan: 0.4 mg   Stress Test Technologist:  Milana Na EMT-P     Nuclear Technologist:  Domenic Polite CNMT  Rest Procedure  Myocardial perfusion imaging was performed at rest 45 minutes following the intravenous administration of Myoview Technetium 17m Tetrofosmin.  Stress Procedure  The patient received IV Lexiscan 0.4 mg over 15-seconds.  Myoview injected at 30-seconds.  There were no significant changes with infusion.  Quantitative spect images were obtained after a 45 minute delay.  QPS Raw Data Images:  Normal; no motion artifact; normal heart/lung ratio. Stress Images:  Mid to apical anterior mild perfusion defect.  Rest Images:  Apical anterior perfusion defect.  Subtraction (SDS):  Mild, partially reversible mid to apical anterior perfusion defect.  Transient Ischemic Dilatation:  1.06  (Normal <1.22)  Lung/Heart Ratio:  .30  (Normal <0.45)  Quantitative Gated Spect Images QGS EDV:  198 ml QGS ESV:  106 ml QGS  EF:  46 % QGS cine images:  Apical hypokinesis.    Overall Impression  Exercise Capacity: Lexiscan study BP Response: Normal blood pressure response. Clinical Symptoms: Lightheaded ECG Impression: No significant ST segment change suggestive of ischemia. Overall Impression: Mild partially reversible mid to apical anterior perfusion defect may be consistent with ischemia.  Overall Impression Comments: Mildly reduced systolic function with apical hypokinesis.   Appended Document: Cardiology Nuclear Study Preliminarily reviewed. Forwarded to MD desktop for review and signature

## 2010-09-05 NOTE — Medication Information (Signed)
Summary: CCR/NE   Anticoagulant Therapy  Managed by: Cloyde Reams, RN, BSN Referring MD: Graciela Husbands PCP: Cindee Salt MD Supervising MD: Mariah Milling Indication 1: Atrial Fibrillation Lab Used: [facility]  Site: [place] INR POC 2.8 INR RANGE 2.0-3.0  Vital Signs: Weight: 231 lbs.     Dietary changes: no    Health status changes: no    Bleeding/hemorrhagic complications: no    Recent/future hospitalizations: no    Any changes in medication regimen? no    Recent/future dental: no  Any missed doses?: yes     Details: missed one pill last night.  Is patient compliant with meds? yes      Comments: Patient reports taking 10 mg daily x 3 weeks.  Allergies: No Known Drug Allergies  Anticoagulation Management History:      The patient is taking warfarin and comes in today for a routine follow up visit.  Negative risk factors for bleeding include an age less than 80 years old.  The bleeding index is 'low risk'.  Positive CHADS2 values include History of HTN.  Negative CHADS2 values include Age > 37 years old.  His last INR was 1.76 and today's INR is 2.8.  Anticoagulation responsible provider: Calla Wedekind.  INR POC: 2.8.    Anticoagulation Management Assessment/Plan:      The patient's current anticoagulation dose is Warfarin sodium 10 mg tabs: Use as directed by Anticoagulation Clinic, Warfarin sodium 2.5 mg tabs: take as directed.  The target INR is 2.0-3.0.  The next INR is due 03/13/2010.  Anticoagulation instructions were given to patient.  Results were reviewed/authorized by Cloyde Reams, RN, BSN.  He was notified by Bishop Dublin, CMA.         Prior Anticoagulation Instructions: INR 1.76  Spoke with pt advised to take an extra 1/2 tablet tonight, than start taking 12.5mg  daily except 10mg  on Tu,Th,Sa.  Recheck in 3 weeks.    Current Anticoagulation Instructions: INR 2.8   Continue on same dosage 10 mg daily. Reck 3 weeks.    Patient Instructions: 1)  Dosed by  Teofilo Pod, RN

## 2010-09-05 NOTE — Progress Notes (Signed)
Summary: prozac  Phone Note Refill Request Message from:  Patient on May 04, 2010 2:13 PM  Refills Requested: Medication #1:  FLUOXETINE HCL 20 MG CAPS 3 capsule once a day [facility] [address]   Initial call taken by: Melody Comas,  May 04, 2010 2:13 PM    Prescriptions: FLUOXETINE HCL 20 MG CAPS (FLUOXETINE HCL) 3 capsule once a day  #90 Capsule x 4   Entered by:   Lowella Petties CMA   Authorized by:   Cindee Salt MD   Signed by:   Lowella Petties CMA on 05/04/2010   Method used:   Electronically to        [facility]. [address] [phone]* (retail)       [address] [place], [place]  [id number]       Ph: [phone]       Fax: [phone]   RxID:   [id number]

## 2010-09-05 NOTE — Progress Notes (Signed)
Summary: PHI  PHI   Imported By: Harlon Flor 08/18/2009 10:50:38  _____________________________________________________________________  External Attachment:    Type:   Image     Comment:   External Document

## 2010-09-05 NOTE — Medication Information (Signed)
Summary: CCR/GLC  Anticoagulant Therapy  Managed by: Cloyde Reams, RN, BSN Referring MD: Graciela Husbands PCP: Cindee Salt MD Supervising MD: Mariah Milling Indication 1: Atrial Fibrillation Lab Used: [facility] Site: [place] INR POC 1.7 INR RANGE 2.0-3.0  Dietary changes: no    Health status changes: no    Bleeding/hemorrhagic complications: no    Recent/future hospitalizations: no    Any changes in medication regimen? yes       Details: OTC omeprazole.    Recent/future dental: no  Any missed doses?: yes     Details: Off Coumadin 7/27, for cath on 8/1 then resumed post cath.    Is patient compliant with meds? yes       Allergies: No Known Drug Allergies  Anticoagulation Management History:      The patient is taking warfarin and comes in today for a routine follow up visit.  Negative risk factors for bleeding include an age less than 73 years old.  The bleeding index is 'low risk'.  Positive CHADS2 values include History of HTN.  Negative CHADS2 values include Age > 44 years old.  His last INR was 1.62.  Anticoagulation responsible provider: gollan.  INR POC: 1.7.  Cuvette Lot#: [id number].  Exp: 05/2010.    Anticoagulation Management Assessment/Plan:      The patient's current anticoagulation dose is Warfarin sodium 10 mg tabs: Use as directed by Anticoagulation Clinic, Warfarin sodium 2.5 mg tabs: take as directed.  The target INR is 2.0-3.0.  The next INR is due 03/28/2010.  Anticoagulation instructions were given to patient.  Results were reviewed/authorized by Cloyde Reams, RN, BSN.  He was notified by Cloyde Reams RN.         Prior Anticoagulation Instructions: INR 2.8   Continue on same dosage 10 mg daily. Reck 3 weeks.    Current Anticoagulation Instructions: INR 1.7  Take 15mg  today, then resume 10mg  daily.  Recheck in [place] after Dr Graciela Husbands appt.

## 2010-09-05 NOTE — Letter (Signed)
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 [address]   [place], [place] [address]   Phone: [phone]  Fax: [phone]     August 18, 2009    [name]   The above named patient had a medical visit today.  He is not to work more than 8 hours daily until further notice.       Sincerely yours,    Sherryl Manges, MD [facility]

## 2010-09-05 NOTE — Progress Notes (Signed)
Summary: NEW MED PROBLEMS   Phone Note Call from Patient Call back at Home Phone [phone]   Caller: SELF Call For: Graciela Husbands Summary of Call: PT STARTED A NEW BP MEDICINE AND WAS FEELING "OUT OF IT" YESTERDAY-FEELS OK TODAY Initial call taken by: Harlon Flor,  August 22, 2009 11:50 AM  Follow-up for Phone Call        pt feeling better today.  Pt states that BP was "low" on friday- first day of starting new medications; couldnt remember exact readings. has not taken BP since.  instructed pt to stay well hydrated and to check pressure if he starts to feel funny again- to call office if BP is abnormal. Will f.u at [facility] visit later this week. Follow-up by: Charlena Cross, RN, BSN,  August 22, 2009 2:38 PM

## 2010-09-05 NOTE — Assessment & Plan Note (Signed)
Summary: eph/ post cath / gd  Medications Added CARVEDILOL 12.5 MG TABS (CARVEDILOL) AS DIRECTED CO-ENZYME Q-10 30 MG CAPS (COENZYME Q10) take one capsule once daily      Allergies Added: NKDA  Primary Provider:  Cindee Salt MD  CC:  eph. post cath.  Pt states he is feeling pretty good but is still having chest pain from time to time.  History of Present Illness: Mr. Bechard is seen followup for atrial fibrillation that was newly identified a few months ago. He underwent TEE guided cardioversion with restoration of sinus rhythm. Ejection fraction at that time was 30%. There is also concern about a bicuspid aortic valve.  Repeat ultrasound  3 months later demonstrated marked improvement in his ejection fraction of 40-45%.  there was  NO evidence of bicuspid aortic valve.  The patient had some complaints of nonexertional chest discomfort and underwent Myoview scanning with an ejection fraction of 46% and some anterior wall redistribution.because of this, on August 1 he underwent catheterization demonstrating normal coronary arteries but modest left ventricular dysfunction       Current Medications (verified): 1)  Fluoxetine Hcl 20 Mg Caps (Fluoxetine Hcl) .... 3 Capsule Once A Day 2)  Multivitamins   Tabs (Multiple Vitamin) .... Take 1 Tablet By Mouth Once A Day 3)  Vitamin C 500 Mg  Tabs (Ascorbic Acid) .... Take 1 Tablet By Mouth Once A Day 4)  B-12 500 Mcg Tabs (Cyanocobalamin) .... Take One By Mouth Daily 5)  Lipitor 20 Mg Tabs (Atorvastatin Calcium) .... Take One Tablet By Mouth Daily. 6)  Warfarin Sodium 10 Mg Tabs (Warfarin Sodium) .... Use As Directed By Anticoagulation Clinic 7)  Carvedilol 6.25 Mg Tabs (Carvedilol) .... Take One Tablet By Mouth Twice A Day 8)  Lotensin 10 Mg Tabs (Benazepril Hcl) .... Take 1 Tab By Mouth Daily 9)  Colchicine 0.6 Mg Tabs (Colchicine) .Marland Kitchen.. 1 By Mouth As Needed 10)  Warfarin Sodium 2.5 Mg Tabs (Warfarin Sodium) .... Take As Directed 11)   Pepcid 20 Mg Tabs (Famotidine) .... Once Daily For 4 Weeks 12)  Co-Enzyme Q-10 30 Mg Caps (Coenzyme Q10) .... Take One Capsule Once Daily  Allergies (verified): No Known Drug Allergies  Past History:  Past Medical History: Last updated: 08/18/2009 Atrial fibrillation--1989 cardiomyopathy ejection fraction 30% January 2011 Depression Gout Hypertension Bicuspid aortic valve--last echo looks trileaflet Hyperlipidemia  Past Surgical History: Last updated: 08/04/2007 1990's      Heart, stroke / seizures 1989         Cardioversion  Family History: Last updated: 01-25-2010 Dad died of MI @50  Mom has DM, depression, arthritis No cancer in family CAD in paternal GF and uncles No HTN Brother: Crohn's disease  Social History: Last updated: 08/04/2007 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 HEENT Normal.  Neck veins were flat, carotids were brisk.  Lungs were clear.  Heart sounds were regular without murmurs or gallops.  Abdomen was soft with  active bowel sounds. There is no clubbing cyanosis or edema. Skin Warm and dry    Impression & Recommendations:  Problem # 1:  ATRIAL FIBRILLATION (ICD-427.31) he is maintaining sinus rhythm; will continue on current medications His updated medication list for this problem includes:    Warfarin Sodium 10 Mg Tabs (Warfarin sodium) ..... Use as directed by anticoagulation clinic    Carvedilol 12.5 Mg Tabs (Carvedilol) .Marland Kitchen... As directed    Warfarin Sodium 2.5 Mg Tabs (Warfarin sodium) .Marland Kitchen... Take as directed  Problem # 2:   CARDIOMYOPATHY DILATED PROBABLY TACHYCARDIA EF  45% (ICD-425.4) hcardiomyopathy has persisted despite resolution of his tachycardia. It appears that it may well be a primary cardiomyopathy. We'll need to consider family screening with both his brother and his son to look for genetic component. In any case he'll be maintained on ACE inhibitors and beta blockers. We will uptitrate his carvedilol from 6-9 mg twice daily and then up to 12 mg at his drug refill.  Problem # 3:  CHEST PAIN (ICD-786.50)  He continues to have chest pain; his catheterization was nonobstructive. Reassurance was offered His updated medication list for this problem includes:    Warfarin Sodium 10 Mg Tabs (Warfarin sodium) ..... Use as directed by anticoagulation clinic    Carvedilol 12.5 Mg Tabs (Carvedilol) .Marland Kitchen... As directed    Lotensin 10 Mg Tabs (Benazepril hcl) .Marland Kitchen... Take 1 tab by mouth daily    Warfarin Sodium 2.5 Mg Tabs (Warfarin sodium) .Marland Kitchen... Take as directed  His updated medication list for this problem includes:    Warfarin Sodium 10 Mg Tabs (Warfarin sodium) ..... Use as directed by anticoagulation clinic    Carvedilol 6.25 Mg Tabs (Carvedilol) .Marland Kitchen... Take one tablet by mouth twice a day    Lotensin 10 Mg Tabs (Benazepril hcl) .Marland Kitchen... Take 1 tab by mouth daily    Warfarin Sodium 2.5 Mg Tabs (Warfarin sodium) .Marland Kitchen... Take as directed  Patient Instructions: 1)  Your physician recommends that you schedule a follow-up appointment in: 3 MONTHS WITH DR Graciela Husbands 2)  0Your physician has recommended you make the following change in your medication: INCREASE CARVEDILOL TO 9.75 MG two times a day THEN WHEN RUNS OUT INCREASE TO CARVEDILOL TO 12.5 MG two times a day Prescriptions: CARVEDILOL 12.5 MG TABS (CARVEDILOL) AS DIRECTED  #60 x 6   Entered by:   Scherrie Bateman, LPN   Authorized by:   Nathen May, MD, [facility]   Signed by:   Scherrie Bateman, LPN on 51/88/4166   Method used:   Electronically to        [facility].  [address] [phone]* (retail)       [address] [place], [place]  [id number]       Ph: [phone]       Fax: [phone]   RxID:   [id number]

## 2010-09-05 NOTE — Medication Information (Signed)
Summary: CCR/AMD  Anticoagulant Therapy  Managed by: Bethena Midget, RN, BSN Referring MD: Graciela Husbands PCP: Cindee Salt MD Supervising MD: Mariah Milling Indication 1: Atrial Fibrillation Lab Used: [facility] [place] Site: [place] INR POC 1.5 INR RANGE 2.0-3.0  Dietary changes: no    Health status changes: no    Bleeding/hemorrhagic complications: no    Recent/future hospitalizations: no    Any changes in medication regimen? yes       Details: Was taking Vitamin C and Vitamin B 12 and PCP discontinued. Using Colchicine PRN   Recent/future dental: no  Any missed doses?: yes     Details: Missed 3 doses since last visit  Is patient compliant with meds? yes      Comments: Pt has been taking 10mg  daily since last visit states he forgot which dose to take  Allergies: No Known Drug Allergies  Anticoagulation Management History:      The patient is taking warfarin and comes in today for a routine follow up visit.  Negative risk factors for bleeding include an age less than 36 years old.  The bleeding index is 'low risk'.  Positive CHADS2 values include History of HTN.  Negative CHADS2 values include Age > 30 years old.  His last INR was 1.62.  Anticoagulation responsible provider: Fiana Gladu.  INR POC: 1.5.  Cuvette Lot#: [id number].  Exp: 07/2011.    Anticoagulation Management Assessment/Plan:      The patient's current anticoagulation dose is Warfarin sodium 10 mg tabs: Use as directed by Anticoagulation Clinic, Warfarin sodium 2.5 mg tabs: take as directed.  The target INR is 2.0-3.0.  The next INR is due 07/19/2010.  Anticoagulation instructions were given to patient.  Results were reviewed/authorized by Bethena Midget, RN, BSN.  He was notified by Bethena Midget, RN, BSN.         Prior Anticoagulation Instructions: INR 1.6  Take 10 mg every day except take 15 mg every Tuesday.  Recheck in 2 weeks.    Current Anticoagulation Instructions: INR 1.5 Today take 15mg s then  change dose to  10mg s everyday except 15mg  on Tuesdays.  Recheck in 2 weeks.

## 2010-09-05 NOTE — Assessment & Plan Note (Signed)
Summary: rov/glc  Medications Added COLCHICINE 0.6 MG TABS (COLCHICINE) 1 by mouth as needed PEPCID 20 MG TABS (FAMOTIDINE) once daily for 4 weeks      Allergies Added: NKDA  Visit Type:  Follow-up Primary Provider:  Cindee Salt MD  CC:  chest pain  w/  tightness.  History of Present Illness: [name] is seen followup for atrial fibrillation that was newly identified a few months ago. He underwent TEE guided cardioversion with restoration of sinus rhythm. Ejection fraction at that time was 30%. There is also concern about her bicuspid aortic valve.  Repeat ultrasound  3 months later demonstrated marked improvement in his ejection fraction of 40-45%.  there was  NO evidence of bicuspid aortic valve.  The patient had some complaints of nonexertional chest discomfort and underwent Myoview scanning with an ejection fraction of 46% and some anterior wall redistribution.  Cardiac risk factors are notable forfamily history dyslipidemia and he has not lost further weight since the 20 pounds he lost at his last visit    Current Medications (verified): 1)  Fluoxetine Hcl 20 Mg Caps (Fluoxetine Hcl) .... 3 Capsule Once A Day 2)  Multivitamins   Tabs (Multiple Vitamin) .... Take 1 Tablet By Mouth Once A Day 3)  Vitamin C 500 Mg  Tabs (Ascorbic Acid) .... Take 1 Tablet By Mouth Once A Day 4)  B-12 500 Mcg Tabs (Cyanocobalamin) .... Take One By Mouth Daily 5)  Lipitor 20 Mg Tabs (Atorvastatin Calcium) .... Take One Tablet By Mouth Daily. 6)  Warfarin Sodium 10 Mg Tabs (Warfarin Sodium) .... Use As Directed By Anticoagulation Clinic 7)  Carvedilol 6.25 Mg Tabs (Carvedilol) .... Take One Tablet By Mouth Twice A Day 8)  Lotensin 10 Mg Tabs (Benazepril Hcl) .... Take 1 Tab By Mouth Daily 9)  Colchicine 0.6 Mg Tabs (Colchicine) .Marland Kitchen.. 1 By Mouth As Needed 10)  Warfarin Sodium 2.5 Mg Tabs (Warfarin Sodium) .... Take As Directed  Allergies (verified): No Known Drug Allergies  Vital  Signs:  Patient profile:   45 year old male Height:      69 inches Weight:      231 pounds BMI:     34.24 Pulse rate:   54 / minute BP sitting:   127 / 80  (left arm) Cuff size:   large  Vitals Entered By: Hardin Negus, RMA (March 01, 2010 3:32 PM)  Physical Exam  General:  Alert and oriented in no acute distress. HEENT  normal . Neck veins were flat; carotids brisk and full without bruits. No lymphadenopathy. Back without kyphosis. Lungs clear. Heart sounds regular without murmurs or gallops. PMI nondisplaced. Abdomen soft with active bowel sounds without midline pulsation or hepatomegaly. Femoral pulses and distal pulses intact. Extremities were without clubbing cyanosis or edemaSkin warm and dry. Neurological exam grossly normal    Impression & Recommendations:  Problem # 1:  CHEST PAIN (ICD-786.50) the patient continues to have chest pain. His Myoview scan was unfortunately not normal with an anterior wall perfusion defect. Given his degree of concern abnormal Myoview scan and notwithstanding the atypical nature of his pain I think that catheterization is indicated. I reviewed this extensively with his family. We talked about the possibility of a radial approach. This will be scheduled as an outpatient following discontinuation of his Coumadin.We discussed the potential benefits as well as potential risks related to cardiac procedures including stroke heart attack and death.  he was told that there might be a need for  intervention and preprocedural antiplatelet therapy would be appropriate at that  juncture His updated medication list for this problem includes:    Warfarin Sodium 10 Mg Tabs (Warfarin sodium) ..... Use as directed by anticoagulation clinic    Carvedilol 6.25 Mg Tabs (Carvedilol) .Marland Kitchen... Take one tablet by mouth twice a day    Lotensin 10 Mg Tabs (Benazepril hcl) .Marland Kitchen... Take 1 tab by mouth daily    Warfarin Sodium 2.5 Mg Tabs (Warfarin sodium) .Marland Kitchen... 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 Ejection fraction remains 45% by Myoview scan. We'll continue him on his current medications. His updated medication list for this problem includes:    Warfarin Sodium 10 Mg Tabs (Warfarin sodium) ..... Use as directed by anticoagulation clinic    Carvedilol 6.25 Mg Tabs (Carvedilol) .Marland Kitchen... Take one tablet by mouth twice a day    Lotensin 10 Mg Tabs (Benazepril hcl) .Marland Kitchen... Take 1 tab by mouth daily    Warfarin Sodium 2.5 Mg Tabs (Warfarin sodium) .Marland Kitchen... Take as directed  Problem # 3:  ATRIAL FIBRILLATION (ICD-427.31) there has been no intercurrent atrial fibrillation of which we are aware His updated medication list for this problem includes:    Warfarin Sodium 10 Mg Tabs (Warfarin sodium) ..... Use as directed by anticoagulation clinic    Carvedilol 6.25 Mg Tabs (Carvedilol) .Marland Kitchen... Take one tablet by mouth twice a day    Warfarin Sodium 2.5 Mg Tabs (Warfarin sodium) .Marland Kitchen... Take as directed  Patient Instructions: 1)  Your physician has recommended you make the following change in your medication: start Pepcid OTC once daily for 4 weeks and HOLD your coumadin 2)  Cardiac Catherization August 1st

## 2010-09-05 NOTE — Medication Information (Signed)
Summary: Coumadin Clinic   Anticoagulant Therapy  Managed by: Charlena Cross, RN, BSN Referring MD: Graciela Husbands PCP: Cindee Salt MD Supervising MD: Mariah Milling Indication 1: Atrial Fibrillation INR RANGE 2.0-3.0  Dietary changes: no    Health status changes: no    Bleeding/hemorrhagic complications: no    Recent/future hospitalizations: no    Any changes in medication regimen? no    Recent/future dental: no  Any missed doses?: no       Is patient compliant with meds? yes       Current Medications (verified): 1)  Fluoxetine Hcl 20 Mg Caps (Fluoxetine Hcl) .... 3 Capsule Once A Day 2)  Multivitamins   Tabs (Multiple Vitamin) .... Take 1 Tablet By Mouth Once A Day 3)  Vitamin C 500 Mg  Tabs (Ascorbic Acid) .... Take 1 Tablet By Mouth Once A Day 4)  B-12 500 Mcg Tabs (Cyanocobalamin) .... Take One By Mouth Daily 5)  Lipitor 20 Mg Tabs (Atorvastatin Calcium) .... Take One Tablet By Mouth Daily. 6)  Warfarin Sodium 5 Mg Tabs (Warfarin Sodium) .... Use As Directed By Anticoagulation Clinic 7)  Carvedilol 6.25 Mg Tabs (Carvedilol) .... Take One Tablet By Mouth Twice A Day 8)  Lotensin 10 Mg Tabs (Benazepril Hcl) .... Take 1/2 Tab By Mouth Daily 9)  Allopurinol 300 Mg Tabs (Allopurinol) .... Take 1 By Mouth Once Daily  Allergies: No Known Drug Allergies  Anticoagulation Management History:      The patient is taking warfarin and comes in today for a routine follow up visit.  Negative risk factors for bleeding include an age less than 44 years old.  The bleeding index is 'low risk'.  Positive CHADS2 values include History of HTN.  Negative CHADS2 values include Age > 44 years old.  His last INR was 1.16.  Anticoagulation responsible provider: gollan.    Anticoagulation Management Assessment/Plan:      The patient's current anticoagulation dose is Warfarin sodium 5 mg tabs: Use as directed by Anticoagulation Clinic.  The target INR is 2.0-3.0.  The next INR is due 09/30/2009.   Anticoagulation instructions were given to patient.  Results were reviewed/authorized by Charlena Cross, RN, BSN.  He was notified by Charlena Cross, RN, BSN.         Prior Anticoagulation Instructions: coumadin 10 mg today and tomorrow then resume coumadin 7.5 mg daily.  recheck in 7 days.  Current Anticoagulation Instructions: Coumadin 10mg  MWF with coumadin 7.5 mg T TH Sa Sun

## 2010-09-05 NOTE — Consult Note (Signed)
[facility]   Imported By: Harlon Flor 08/09/2009 11:04:26  _____________________________________________________________________  External Attachment:    Type:   Image     Comment:   External Document

## 2010-09-07 NOTE — Medication Information (Signed)
Summary: rov/ewj   Anticoagulant Therapy  Managed by: Lanny Hurst, RN Referring MD: Graciela Husbands PCP: Cindee Salt MD Supervising MD: Graciela Husbands MD, Viviann Spare Indication 1: Atrial Fibrillation Lab Used: [facility] [place] Site: [place] INR POC 2.5 INR RANGE 2.0-3.0  Dietary changes: no    Health status changes: no    Bleeding/hemorrhagic complications: no    Recent/future hospitalizations: no    Any changes in medication regimen? no    Recent/future dental: no  Any missed doses?: no       Is patient compliant with meds? yes       Allergies: No Known Drug Allergies  Anticoagulation Management History:      The patient is taking warfarin and comes in today for a routine follow up visit.  Negative risk factors for bleeding include an age less than 20 years old.  The bleeding index is 'low risk'.  Positive CHADS2 values include History of HTN.  Negative CHADS2 values include Age > 73 years old.  His last INR was 1.62.  Anticoagulation responsible provider: Graciela Husbands MD, Viviann Spare.  INR POC: 2.5.  Exp: 09/2011.    Anticoagulation Management Assessment/Plan:      The patient's current anticoagulation dose is Warfarin sodium 10 mg tabs: Use as directed by Anticoagulation Clinic, Warfarin sodium 2.5 mg tabs: take as directed.  The target INR is 2.0-3.0.  The next INR is due 09/22/2010.  Anticoagulation instructions were given to patient.  Results were reviewed/authorized by Lanny Hurst, RN.  He was notified by Lanny Hurst RN.         Prior Anticoagulation Instructions: INR 1.3  Take 15mg  today, then start taking 10mg  daily except 15mg  on Tuesdays and Saturdays.  Recheck in 2 weeks.   Current Anticoagulation Instructions: INR 2.5  Continue same dosage 10mg  everyday except 15mg  on Tuesdays only. Recheck INR in 4 weeks.

## 2010-09-07 NOTE — Assessment & Plan Note (Signed)
Summary: per check out/sf  Medications Added LIPITOR 20 MG TABS (ATORVASTATIN CALCIUM) Take one tablet by mouth daily. LOTENSIN 10 MG TABS (BENAZEPRIL HCL) take 1 tab by mouth daily LOTENSIN 10 MG TABS (BENAZEPRIL HCL) take 1/2 tab by mouth daily      Allergies Added: NKDA  Visit Type:  Follow-up Primary Provider:  Cindee Salt MD  CC:  c/o occasional palpitations. Denies chest pain and SOB.Marland Kitchen  History of Present Illness: Mr. Shirah is seen followup for atrial fibrillation that was newly identified a few months ago. He underwent TEE guided cardioversion with restoration of sinus rhythm. Ejection fraction at that time was 30%. There is also concern about a bicuspid aortic valve.  Repeat ultrasound  3 months later demonstrated marked improvement in his ejection fraction of 40-45%.  there was  NO evidence of bicuspid aortic valve.  The patient had some complaints of nonexertional chest discomfort and underwent Myoview scanning with an ejection fraction of 46% and some anterior wall redistribution.because of this, on August 1 he underwent catheterization demonstrating normal coronary arteries but modest left ventricular dysfunction  a ltille disappointed that he has regained wdight       Current Medications (verified): 1)  Carvedilol 12.5 Mg Tabs (Carvedilol) .... Take 1 Tablet By Mouth Two Times A Day 2)  Warfarin Sodium 10 Mg Tabs (Warfarin Sodium) .... Use As Directed By Anticoagulation Clinic 3)  Warfarin Sodium 2.5 Mg Tabs (Warfarin Sodium) .... Take As Directed 4)  Lipitor 20 Mg Tabs (Atorvastatin Calcium) .... Take One Tablet By Mouth Daily. 5)  Fluoxetine Hcl 20 Mg Caps (Fluoxetine Hcl) .... 3 Capsule Once A Day 6)  Lotensin 10 Mg Tabs (Benazepril Hcl) .... Take 1/2 Tab By Mouth Daily 7)  Colchicine 0.6 Mg Tabs (Colchicine) .Marland Kitchen.. 1 By Mouth As Needed 8)  Prilosec Otc 20 Mg Tbec (Omeprazole Magnesium) .Marland Kitchen.. 1 Tab Daily On An Empty Stomach  Allergies (verified): No Known  Drug Allergies  Past History:  Past Medical History: Last updated: 06/13/2010 Atrial fibrillation--1989 cardiomyopathy ejection fraction 30% January 2011 Depression Gout Hypertension Bicuspid aortic valve--last echo looks trileaflet Hyperlipidemia GERD  Past Surgical History: Last updated: 06/13/2010 1990's      Heat stroke / seizures 1989         Cardioversion 8/11 Cath--no sig coronary disease. EF ~40%  Family History: Last updated: 02/15/2010 Dad died of MI @50  Mom has DM, depression, arthritis No cancer in family CAD in paternal GF and uncles No HTN Brother: Crohn's disease  Social History: Last updated: 08/04/2007 Occupation: Estate manager/land agent @ [employer] Married--1 son Never Smoked Alcohol use-yes  Risk Factors: Smoking Status: never (08/04/2007)  Vital Signs:  Patient profile:   45 year old male Height:      69 inches Weight:      242 pounds BMI:     35.87 Pulse rate:   66 / minute BP sitting:   142 / 72  (left arm) Cuff size:   large  Vitals Entered By: Lysbeth Galas CMA (August 09, 2010 4:35 PM)  Physical Exam  General:  The patient was alert and oriented in no acute distress. HEENT Normal.  Neck veins were flat, carotids were brisk.  Lungs were clear.  Heart sounds were regular without murmurs or gallops.  Abdomen was soft with active bowel sounds. There is no clubbing cyanosis or edema. Skin Warm and dry    EKG  Procedure date:  08/09/2010  Findings:      sinus@ 66 .18/.09/.41  ow normal  Impression & Recommendations:  Problem # 1:  ATRIAL FIBRILLATION (ICD-427.31) holdinhg sinus as abest we know; continue coumadin His updated medication list for this problem includes:    Carvedilol 12.5 Mg Tabs (Carvedilol) .Marland Kitchen... Take 1 tablet by mouth two times a day    Warfarin Sodium 10 Mg Tabs (Warfarin sodium) ..... Use as directed by anticoagulation clinic    Warfarin Sodium 2.5 Mg Tabs (Warfarin sodium) .Marland Kitchen... Take as directed  Problem # 2:   HYPERTENSION (ICD-401.9) modestl;ey elevated.  will resume lisinopril 10 and he wiill clrify how much coreg he is on His updated medication list for this problem includes:    Carvedilol 12.5 Mg Tabs (Carvedilol) .Marland Kitchen... Take 1 tablet by mouth two times a day    Lotensin 10 Mg Tabs (Benazepril hcl) .Marland Kitchen... 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 Take 1 tablet by mouth two times a day    Warfarin Sodium 10 Mg Tabs (Warfarin sodium) ..... Use as directed by anticoagulation clinic    Warfarin Sodium 2.5 Mg Tabs (Warfarin sodium) .Marland Kitchen... Take as directed    Lotensin 10 Mg Tabs (Benazepril hcl) .Marland Kitchen... Take 1 tab by mouth daily  Patient Instructions: 1)  Your physician recommends that you schedule a follow-up appointment in: 4 MONTHS 2)  Your physician has recommended you make the following change in your medication: INCREASE Lotensin to 1 tab once daily. Call office to confirm correct dose of Carvedilol. Prescriptions: LIPITOR 20 MG TABS (ATORVASTATIN CALCIUM) Take one tablet by mouth daily.  #30 x 6   Entered by:   Lanny Hurst RN   Authorized by:   Nathen May, MD, [facility]   Signed by:   Lanny Hurst RN on 08/09/2010   Method used:   Electronically to        [facility]. [address] [phone]* (retail)       [address] [place], [place]  [id number]       Ph: [phone]       Fax: [phone]   RxID:   [id number] LOTENSIN 10 MG TABS (BENAZEPRIL HCL)  take 1 tab by mouth daily  #30 x 6   Entered by:   Lanny Hurst RN   Authorized by:   Nathen May, MD, [facility]   Signed by:   Lanny Hurst RN on 08/09/2010   Method used:   Electronically to        [facility]. [address] [phone]* (retail)       [address] [place], [place]  [id number]       Ph: [phone]       Fax: [phone]   RxID:   [id number]

## 2010-09-07 NOTE — Medication Information (Signed)
Summary: rov/ewj  Anticoagulant Therapy  Managed by: Cloyde Reams, RN, BSN Referring MD: Graciela Husbands PCP: Cindee Salt MD Supervising MD: Graciela Husbands MD, Viviann Spare Indication 1: Atrial Fibrillation Lab Used: [facility] [place] Site: [place] INR POC 1.3 INR RANGE 2.0-3.0  Dietary changes: yes       Details: Incr vit K intake.  Health status changes: no    Bleeding/hemorrhagic complications: no    Recent/future hospitalizations: no    Any changes in medication regimen? no    Recent/future dental: no  Any missed doses?: yes     Details: May have missed a couple doses over the past 2 weeks.    Is patient compliant with meds? yes       Allergies: No Known Drug Allergies  Anticoagulation Management History:      The patient is taking warfarin and comes in today for a routine follow up visit.  Negative risk factors for bleeding include an age less than 69 years old.  The bleeding index is 'low risk'.  Positive CHADS2 values include History of HTN.  Negative CHADS2 values include Age > 74 years old.  His last INR was 1.62.  Anticoagulation responsible provider: Graciela Husbands MD, Viviann Spare.  INR POC: 1.3.  Cuvette Lot#: [id number].  Exp: 09/2011.    Anticoagulation Management Assessment/Plan:      The patient's current anticoagulation dose is Warfarin sodium 10 mg tabs: Use as directed by Anticoagulation Clinic, Warfarin sodium 2.5 mg tabs: take as directed.  The target INR is 2.0-3.0.  The next INR is due 08/23/2010.  Anticoagulation instructions were given to patient.  Results were reviewed/authorized by Cloyde Reams, RN, BSN.  He was notified by Cloyde Reams RN.         Prior Anticoagulation Instructions: INR 1.5 Today take 15mg s then change dose to  10mg s everyday except 15mg  on Tuesdays.  Recheck in 2 weeks.   Current Anticoagulation Instructions: INR 1.3  Take 15mg  today, then start taking 10mg  daily except 15mg  on Tuesdays and Saturdays.  Recheck in 2 weeks.

## 2010-09-22 ENCOUNTER — Other Ambulatory Visit: Payer: Self-pay

## 2010-10-24 ENCOUNTER — Encounter: Payer: Self-pay | Admitting: Internal Medicine

## 2010-10-24 DIAGNOSIS — I4891 Unspecified atrial fibrillation: Secondary | ICD-10-CM

## 2010-10-24 DIAGNOSIS — Z7901 Long term (current) use of anticoagulants: Secondary | ICD-10-CM | POA: Insufficient documentation

## 2010-11-15 LAB — BASIC METABOLIC PANEL
BUN: 10 mg/dL (ref 6–23)
BUN: 12 mg/dL (ref 6–23)
BUN: 18 mg/dL (ref 6–23)
CO2: 27 mEq/L (ref 19–32)
CO2: 32 mEq/L (ref 19–32)
CO2: 33 mEq/L — ABNORMAL HIGH (ref 19–32)
Chloride: 103 mEq/L (ref 96–112)
Chloride: 106 mEq/L (ref 96–112)
Chloride: 97 mEq/L (ref 96–112)
Chloride: 99 mEq/L (ref 96–112)
Creatinine, Ser: 0.83 mg/dL (ref 0.4–1.5)
Creatinine, Ser: 0.93 mg/dL (ref 0.4–1.5)
Creatinine, Ser: 1.19 mg/dL (ref 0.4–1.5)
GFR calc Af Amer: >60 mL/min (ref >60)
GFR calc Af Amer: >60 mL/min (ref >60)
GFR calc Af Amer: >60 mL/min (ref >60)
GFR calc non Af Amer: >60 mL/min (ref >60)
Potassium: 3.8 mEq/L (ref 3.5–5.1)
Potassium: 3.9 mEq/L (ref 3.5–5.1)
Potassium: 4.1 mEq/L (ref 3.5–5.1)

## 2010-11-15 LAB — CBC
HCT: 29.2 % — ABNORMAL LOW (ref 39.0–52.0)
HCT: 29.8 % — ABNORMAL LOW (ref 39.0–52.0)
HCT: 29.9 % — ABNORMAL LOW (ref 39.0–52.0)
HCT: 30.5 % — ABNORMAL LOW (ref 39.0–52.0)
HCT: 38.7 % — ABNORMAL LOW (ref 39.0–52.0)
Hemoglobin: 10 g/dL — ABNORMAL LOW (ref 13.0–17.0)
Hemoglobin: 12.1 g/dL — ABNORMAL LOW (ref 13.0–17.0)
Hemoglobin: 13.4 g/dL (ref 13.0–17.0)
MCHC: 33.9 g/dL (ref 30.0–36.0)
MCHC: 34.4 g/dL (ref 30.0–36.0)
MCHC: 34.5 g/dL (ref 30.0–36.0)
MCHC: 35.1 g/dL (ref 30.0–36.0)
MCV: 91.6 fL (ref 78.0–100.0)
MCV: 91.7 fL (ref 78.0–100.0)
MCV: 91.9 fL (ref 78.0–100.0)
MCV: 91.9 fL (ref 78.0–100.0)
MCV: 92.3 fL (ref 78.0–100.0)
Platelets: 232 10*3/uL (ref 150–400)
RBC: 3.18 MIL/uL — ABNORMAL LOW (ref 4.22–5.81)
RBC: 3.25 MIL/uL — ABNORMAL LOW (ref 4.22–5.81)
RBC: 3.26 MIL/uL — ABNORMAL LOW (ref 4.22–5.81)
RBC: 3.31 MIL/uL — ABNORMAL LOW (ref 4.22–5.81)
RBC: 3.88 MIL/uL — ABNORMAL LOW (ref 4.22–5.81)
WBC: 6.7 10*3/uL (ref 4.0–10.5)
WBC: 8.1 10*3/uL (ref 4.0–10.5)
WBC: 9.2 10*3/uL (ref 4.0–10.5)

## 2010-11-15 LAB — BLOOD GAS, ARTERIAL
Acid-Base Excess: 7 mmol/L — ABNORMAL HIGH (ref 0.0–2.0)
Bicarbonate: 31.6 mEq/L — ABNORMAL HIGH (ref 20.0–24.0)
FIO2: 0.6 %
O2 Saturation: 97.5 %
TCO2: 33.2 mmol/L (ref 0–100)
pCO2 arterial: 50.3 mmHg — ABNORMAL HIGH (ref 35.0–45.0)
pO2, Arterial: 90.3 mmHg (ref 80.0–100.0)

## 2010-11-15 LAB — CULTURE, BLOOD (ROUTINE X 2): Culture: NO GROWTH

## 2010-11-15 LAB — URINE CULTURE: Culture: NO GROWTH

## 2010-11-15 LAB — SAMPLE TO BLOOD BANK

## 2010-11-15 LAB — POCT I-STAT, CHEM 8
Calcium, Ion: 1.13 mmol/L (ref 1.12–1.32)
Chloride: 106 mEq/L (ref 96–112)
HCT: 40 % (ref 39.0–52.0)
Hemoglobin: 13.6 g/dL (ref 13.0–17.0)
Potassium: 3.8 mEq/L (ref 3.5–5.1)

## 2010-11-15 LAB — TYPE AND SCREEN: ABO/RH(D): A POS
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
## 2010-12-20 ENCOUNTER — Encounter: Payer: Self-pay | Admitting: Emergency Medicine

## 2010-12-25 ENCOUNTER — Telehealth: Payer: Self-pay | Admitting: *Deleted

## 2010-12-25 NOTE — Telephone Encounter (Signed)
Pt called this Am stating that his wife is in [place] and has recently had an MI, now he will need to travel to [facility] at high altitude and wants to see what Dr. Odessa Fleming recommendations are for his travel due to pt's cardiac history. Pt states [place], but wants to know the best route to take. Pt has h/o a-fib, htn, COM, and sees Dr. Graciela Husbands for a fib, pt is s/p DCCV. Per Dr. Graciela Husbands, advised pt that the longer he is at lower altitudes the better, if he is able to do this. However, since pt is trying to get to his wife, notified him he should be ok to go directly there and just be aware that he will be more SOB due to altitude. Pt is ok with this.

## 2011-01-04 ENCOUNTER — Ambulatory Visit: Payer: Self-pay | Admitting: Cardiovascular Disease

## 2011-01-18 ENCOUNTER — Encounter: Payer: Self-pay | Admitting: Internal Medicine

## 2011-01-30 ENCOUNTER — Ambulatory Visit: Payer: Self-pay | Admitting: Internal Medicine

## 2011-02-23 ENCOUNTER — Ambulatory Visit: Payer: Self-pay | Admitting: Internal Medicine

## 2011-02-26 ENCOUNTER — Encounter: Payer: Self-pay | Admitting: Internal Medicine

## 2011-03-17 ENCOUNTER — Other Ambulatory Visit: Payer: Self-pay | Admitting: Internal Medicine

## 2011-03-19 NOTE — Telephone Encounter (Signed)
Okay to refill for 1 year He is overdue for physical Please have him schedule this in the next few months

## 2011-03-19 NOTE — Telephone Encounter (Signed)
rx sent to pharmacy by e-script Spoke with patient and advised results   

## 2011-03-26 ENCOUNTER — Encounter: Payer: Self-pay | Admitting: Internal Medicine

## 2011-03-26 ENCOUNTER — Ambulatory Visit (INDEPENDENT_AMBULATORY_CARE_PROVIDER_SITE_OTHER): Payer: BC Managed Care – PPO | Admitting: Internal Medicine

## 2011-03-26 VITALS — BP 110/74 | HR 60 | Resp 16 | Ht 70.0 in | Wt 239.0 lb

## 2011-03-26 DIAGNOSIS — I10 Essential (primary) hypertension: Secondary | ICD-10-CM

## 2011-03-26 DIAGNOSIS — I4891 Unspecified atrial fibrillation: Secondary | ICD-10-CM
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
## 2011-03-26 NOTE — Assessment & Plan Note (Signed)
Stable

## 2011-03-26 NOTE — Progress Notes (Signed)
  HPI  [name] is a 45 y.o. male seen followup for atrial fibrillation that was newly identified a year ago. He underwent TEE guided cardioversion with restoration of sinus rhythm. Ejection fraction at that time was 30%. There is also concern about a bicuspid aortic valve. Repeat ultrasound  3 months later demonstrated marked improvement in his ejection fraction of 40-45%.  there was  NO evidence of bicuspid aortic valve.  The patient had some complaints of nonexertional chest discomfort and underwent Myoview scanning with an ejection fraction of  46% and some anterior wall redistribution.because of this, on August 2011 he underwent catheterization demonstrating normal  coronary arteries but modest left ventricular dysfunction  The patient denies SOB, chest pain edema ; he has had some problems with her palpitations. They are worse with alcohol  But nt w energy drinks There has been no syncope or presyncope.   Past Medical History  Diagnosis Date  . Cardiomyopathy 08/2009    EF 30%  . Depression   . Gout   . Hypertension   . Bicuspid aortic valve     Last echo looks trileaflet  . Hyperlipidemia   . GERD (gastroesophageal reflux disease)   . Atrial fibrillation 1989    Past Surgical History  Procedure Date  . Heat stroke/seizures 1990s  . Cardioversion 1989  . Cardiac catheterization 03/2010    No sig coronary disease, EF ~40%    Current Outpatient Prescriptions  Medication Sig Dispense Refill  . atorvastatin (LIPITOR) 20 MG tablet Take 20 mg by mouth daily.        . benazepril (LOTENSIN) 10 MG tablet Take 10 mg by mouth daily.        . carvedilol (COREG) 12.5 MG tablet Take 12.5 mg by mouth 2 (two) times daily with a meal.        . colchicine 0.6 MG tablet Take 0.6 mg by mouth daily as needed.        Marland Kitchen FLUoxetine (PROZAC) 20 MG capsule TAKE 3  CAPSULES BY MOUTH ONCE A DAY  90 capsule  4  . warfarin (COUMADIN) 10 MG tablet Take by mouth as directed.          No  Known Allergies  Review of Systems negative except from HPI and PMH  Physical Exam Well developed and well nourished in no acute distress HENT normal E scleral and icterus clear Neck Supple JVP flat; carotids brisk and full Clear to ausculation Regular rate and rhythm, no murmurs gallops or rub Soft with active bowel sounds No clubbing cyanosis and edema Alert and oriented, grossly normal motor and sensory function Skin Warm and Dry  ECG sinus 60 17/..09/.42 PRWP  Assessment and  Plan

## 2011-03-29 ENCOUNTER — Ambulatory Visit: Payer: Self-pay | Admitting: Internal Medicine

## 2011-04-10 ENCOUNTER — Other Ambulatory Visit: Payer: Self-pay | Admitting: Internal Medicine

## 2011-05-11 ENCOUNTER — Other Ambulatory Visit: Payer: Self-pay | Admitting: Internal Medicine

## 2011-05-11 NOTE — Telephone Encounter (Signed)
Pt missed his last Coumadin check appt on 09/22/10 and did not reschedule.  Pt states he did not know he was supposed to continue to have INR checked.  Pt is out of Coumadin and needs refill.  Advised pt I cannot refill until INR checked.  Pt states he cannot come in until 05/18/11, made OV in [place] office. Discussed with Weston Brass, Pharm D and she said we can call him in enough Coumadin to get to this appt, but pt must keep scheduled appt for further refills. Pt voices understanding.  Also clarified with pt that even when his results are perfect he will need to f/u at least EVERY 4 weeks to have Coumadin checked.

## 2011-05-18 ENCOUNTER — Ambulatory Visit (INDEPENDENT_AMBULATORY_CARE_PROVIDER_SITE_OTHER): Payer: BC Managed Care – PPO | Admitting: Emergency Medicine

## 2011-05-18 ENCOUNTER — Other Ambulatory Visit: Payer: Self-pay

## 2011-05-18 DIAGNOSIS — I4891 Unspecified atrial fibrillation: Secondary | ICD-10-CM

## 2011-05-18 DIAGNOSIS — Z7901 Long term (current) use of anticoagulants: Secondary | ICD-10-CM

## 2011-05-18 MED ORDER — WARFARIN SODIUM 10 MG PO TABS: ORAL_TABLET | ORAL | Status: DC

## 2011-06-15 ENCOUNTER — Ambulatory Visit (INDEPENDENT_AMBULATORY_CARE_PROVIDER_SITE_OTHER): Payer: BC Managed Care – PPO | Admitting: *Deleted

## 2011-06-15 ENCOUNTER — Encounter: Payer: Self-pay | Admitting: *Deleted

## 2011-06-15 ENCOUNTER — Encounter: Payer: BC Managed Care – PPO | Admitting: Emergency Medicine

## 2011-06-15 DIAGNOSIS — Z7901 Long term (current) use of anticoagulants: Secondary | ICD-10-CM

## 2011-06-15 DIAGNOSIS — I4891 Unspecified atrial fibrillation: Secondary | ICD-10-CM

## 2011-06-15 LAB — POCT INR: INR: 2.1

## 2011-06-15 NOTE — Progress Notes (Signed)
The patient will call for an appointment if necessary.

## 2011-06-15 NOTE — Progress Notes (Signed)
Patient came in for PT/INR today with reading of 2.1.  The patient is c/o fluttering in chest for a couple of days.  An EKG was performed which is normal.  Told the patient if still has fluttering or palpitations more frequently to call for an appointment with Dr. Graciela Husbands.

## 2011-06-25 ENCOUNTER — Telehealth: Payer: Self-pay

## 2011-06-25 MED ORDER — WARFARIN SODIUM 10 MG PO TABS: ORAL_TABLET | ORAL | Status: DC

## 2011-06-25 NOTE — Telephone Encounter (Signed)
The patient is requesting a refill on his Warfarin dose.  He picked up ten tablets on Nov. 5, 2012 but the pharmacist states patient does need another refill sent in.  Please verify this information.  Refill should be sent to [facility] on [address] in [place].

## 2011-07-13 ENCOUNTER — Ambulatory Visit (INDEPENDENT_AMBULATORY_CARE_PROVIDER_SITE_OTHER): Payer: BC Managed Care – PPO | Admitting: Emergency Medicine

## 2011-07-13 DIAGNOSIS — I4891 Unspecified atrial fibrillation: Secondary | ICD-10-CM

## 2011-07-13 DIAGNOSIS — Z7901 Long term (current) use of anticoagulants: Secondary | ICD-10-CM

## 2011-07-13 LAB — POCT INR: INR: 2.7

## 2011-07-18 ENCOUNTER — Encounter: Payer: BC Managed Care – PPO | Admitting: Emergency Medicine

## 2011-07-25 ENCOUNTER — Encounter: Payer: BC Managed Care – PPO | Admitting: Emergency Medicine

## 2011-08-10 ENCOUNTER — Telehealth: Payer: Self-pay | Admitting: *Deleted

## 2011-08-10 ENCOUNTER — Ambulatory Visit (INDEPENDENT_AMBULATORY_CARE_PROVIDER_SITE_OTHER): Payer: BC Managed Care – PPO | Admitting: Emergency Medicine

## 2011-08-10 DIAGNOSIS — Z7901 Long term (current) use of anticoagulants: Secondary | ICD-10-CM

## 2011-08-10 DIAGNOSIS — I4891 Unspecified atrial fibrillation: Secondary | ICD-10-CM

## 2011-08-10 NOTE — Telephone Encounter (Signed)
Pt was seen today for INR visit. He c/o of recent incr of "fluttering in chest." He has noted one episode when HR in 90s at rest with these symptoms. Normal HR for him is in 60s. He takes Coreg 12.5 BID and has not missed any doses, he has however, started "Insanity" workout videos that have been a big difference in his normal routine. I told him to track BP/HR over weekend, document episodes, frequency and will discuss with Dr. Mariah Milling to see if he needs any change in his meds or PRN dose for breakthrough a-fib/tachycardia. I told pt if continues to back off on video for now until we can get rate under control and we can check EKG in office prior to next visit if needed. He is going to call back on Monday. He has moved his appt up sooner with Dr. Mariah Milling to 09/11/11.

## 2011-08-11 ENCOUNTER — Other Ambulatory Visit: Payer: Self-pay | Admitting: Internal Medicine

## 2011-08-21 ENCOUNTER — Other Ambulatory Visit: Payer: Self-pay | Admitting: Internal Medicine

## 2011-08-21 NOTE — Telephone Encounter (Signed)
Okay to fill for 3months Should set up appt within that time--preferably a PE

## 2011-08-21 NOTE — Telephone Encounter (Signed)
Ok to refill? Pt last seen 06/13/10 was told on 03/17/11 that he need f/u appt . Pt has been seeing cardiology regularly.

## 2011-08-22 ENCOUNTER — Telehealth: Payer: Self-pay

## 2011-08-22 MED ORDER — WARFARIN SODIUM 10 MG PO TABS: ORAL_TABLET | ORAL | Status: DC

## 2011-08-22 NOTE — Telephone Encounter (Signed)
Refill sent for warfarin  

## 2011-08-22 NOTE — Telephone Encounter (Signed)
Left message at home asking pt to call to schedule a physical. rx sent to pharmacy by e-script

## 2011-08-27 ENCOUNTER — Other Ambulatory Visit: Payer: Self-pay | Admitting: Internal Medicine

## 2011-08-27 ENCOUNTER — Ambulatory Visit (INDEPENDENT_AMBULATORY_CARE_PROVIDER_SITE_OTHER): Payer: BC Managed Care – PPO | Admitting: Internal Medicine

## 2011-08-27 ENCOUNTER — Encounter: Payer: Self-pay | Admitting: Internal Medicine

## 2011-08-27 VITALS — BP 120/80 | HR 55 | Temp 98.6°F | Ht 70.0 in | Wt 249.0 lb

## 2011-08-27 DIAGNOSIS — Z0000 Encounter for general adult medical examination without abnormal findings: Secondary | ICD-10-CM

## 2011-08-27 DIAGNOSIS — Z23 Encounter for immunization: Secondary | ICD-10-CM

## 2011-08-27 DIAGNOSIS — R7301 Impaired fasting glucose: Secondary | ICD-10-CM | POA: Insufficient documentation

## 2011-08-27 DIAGNOSIS — I4891 Unspecified atrial fibrillation: Secondary | ICD-10-CM

## 2011-08-27 DIAGNOSIS — E669 Obesity, unspecified: Secondary | ICD-10-CM | POA: Insufficient documentation

## 2011-08-27 DIAGNOSIS — I10 Essential (primary) hypertension: Secondary | ICD-10-CM

## 2011-08-27 DIAGNOSIS — I428 Other cardiomyopathies: Secondary | ICD-10-CM

## 2011-08-27 DIAGNOSIS — F3289 Other specified depressive episodes: Secondary | ICD-10-CM

## 2011-08-27 DIAGNOSIS — E785 Hyperlipidemia, unspecified: Secondary | ICD-10-CM

## 2011-08-27 DIAGNOSIS — M109 Gout, unspecified: Secondary | ICD-10-CM

## 2011-08-27 DIAGNOSIS — F329 Major depressive disorder, single episode, unspecified: Secondary | ICD-10-CM

## 2011-08-27 LAB — HEPATIC FUNCTION PANEL
AST: 32 U/L (ref 0–37)
Alkaline Phosphatase: 48 U/L (ref 39–117)
Bilirubin, Direct: 0 mg/dL (ref 0.0–0.3)
Total Protein: 6.9 g/dL (ref 6.0–8.3)

## 2011-08-27 LAB — BASIC METABOLIC PANEL
Calcium: 8.9 mg/dL (ref 8.4–10.5)
GFR: 70.29 mL/min (ref >60.00)
Potassium: 4.1 mEq/L (ref 3.5–5.1)
Sodium: 138 mEq/L (ref 135–145)

## 2011-08-27 LAB — LIPID PANEL
Total CHOL/HDL Ratio: 6
Triglycerides: 633 mg/dL — ABNORMAL HIGH (ref 0.0–149.0)
VLDL: 126.6 mg/dL — ABNORMAL HIGH (ref 0.0–40.0)

## 2011-08-27 LAB — CBC WITH DIFFERENTIAL/PLATELET
Basophils Absolute: 0 10*3/uL (ref 0.0–0.1)
Basophils Relative: 0.5 % (ref 0.0–3.0)
Eosinophils Relative: 0 % (ref 0.0–5.0)
Hemoglobin: 12.9 g/dL — ABNORMAL LOW (ref 13.0–17.0)
Lymphocytes Relative: 25.2 % (ref 12.0–46.0)
Monocytes Relative: 11.2 % (ref 3.0–12.0)
Neutro Abs: 3.9 10*3/uL (ref 1.4–7.7)
RBC: 4.02 Mil/uL — ABNORMAL LOW (ref 4.22–5.81)
WBC: 6.2 10*3/uL (ref 4.5–10.5)

## 2011-08-27 NOTE — Assessment & Plan Note (Signed)
generally healthy Discussed fitness UTD on tetanus

## 2011-08-27 NOTE — Assessment & Plan Note (Signed)
BP Readings from Last 3 Encounters:  08/27/11 120/80  06/15/11 110/70  03/26/11 110/74   Good control No changes

## 2011-08-27 NOTE — Assessment & Plan Note (Signed)
Doesn't do well on weight watchers Will have him try [facility]

## 2011-08-27 NOTE — Assessment & Plan Note (Signed)
Quiet of late

## 2011-08-27 NOTE — Patient Instructions (Signed)
Please try regularly staying on the [employer]

## 2011-08-27 NOTE — Progress Notes (Signed)
Subjective:    Patient ID: [name], male    DOB: 14-Jan-1966, 46 y.o.   MRN: [id number]  HPI Doing okay  Here for physical  No new heart issues Seeing Dr Graciela Husbands next week Does get heart fluttering after cardio work, or when resting at night Spells can last 15 minutes to a couple of hours No associated chest pain in dyspnea Chronic mild edema  Having a hard time with his weight Regular with exercise Probably does eat too much--tries to eat healthy when travelling for work No sugary drinks Has done calorie counting---can't do it with his appetite  Mood has been better Not as much "on the go" More at ease  Current Outpatient Prescriptions on File Prior to Visit  Medication Sig Dispense Refill  . benazepril (LOTENSIN) 10 MG tablet take 1 tablet by mouth once daily  30 tablet  6  . carvedilol (COREG) 12.5 MG tablet take 1 tablet by mouth twice a day  60 tablet  6  . colchicine 0.6 MG tablet Take 0.6 mg by mouth daily as needed.        Marland Kitchen FLUoxetine (PROZAC) 20 MG capsule Take 3 capsules (60 mg total) by mouth daily.  90 capsule  3  . warfarin (COUMADIN) 10 MG tablet Take as directed by anticoagulation clinic  35 tablet  0    No Known Allergies  Past Medical History  Diagnosis Date  . Cardiomyopathy 08/2009    Mildly decreased EF  . Depression   . Gout   . Hypertension   . Bicuspid aortic valve     Last echo looks trileaflet  . Hyperlipidemia   . GERD (gastroesophageal reflux disease)   . Atrial fibrillation 1989    Past Surgical History  Procedure Date  . Heat stroke/seizures 1990s  . Cardioversion 1989  . Cardiac catheterization 03/2010    No sig coronary disease, EF ~40%    Family History  Problem Relation Age of Onset  . Heart attack Father     MI  . Diabetes Mother   . Depression Mother   . Arthritis Mother   . Coronary artery disease Paternal Grandfather   . Coronary artery disease Paternal Uncle   . Crohn's disease Brother   . Cancer Neg Hx     . Hypertension Neg Hx     History   Social History  . Marital Status: Married    Spouse Name: N/A    Number of Children: 1  . 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 Smoking status: Never Smoker   . Smokeless tobacco: Never Used  . Alcohol Use: Yes  . Drug Use: No  . Sexually Active:    Other Topics Concern  . Not on file   Social History Narrative   Married with 1 son   Review of Systems  Constitutional: Positive for unexpected weight change. Negative for fatigue.       Wears seat belt  HENT: Positive for tinnitus. Negative for hearing loss, congestion, rhinorrhea and dental problem.        Irritating piece of skin at left ear opening Regular with dentist  Eyes: Negative for visual disturbance.       No diplopia or unilateral vision loss  Respiratory: Negative for cough, chest tightness and shortness of breath.   Cardiovascular: Positive for palpitations. Negative for chest pain and leg swelling.  Gastrointestinal:  Negative for nausea, vomiting, abdominal pain, constipation and blood in stool.       Occ heartburn---uses OTC meds prn (every few weeks) Chronic gas  Genitourinary: Negative for urgency, frequency and difficulty urinating.       Now has nocturia x 1-2 Occ ED  Musculoskeletal: Positive for back pain. Negative for joint swelling and arthralgias.  Skin: Positive for rash.       Chronic bumps on lower calves since [place] No suspicious lesions  Neurological: Positive for dizziness, numbness and headaches. Negative for syncope, weakness and light-headedness.       Occ dizziness if he stands up too quickly occ numbness in fingers or right arm---seems positional and eases up with change in position Rare headaches--much better than in the past  Hematological: Negative for adenopathy. Does not bruise/bleed easily.  Psychiatric/Behavioral: Positive for sleep disturbance.  Negative for dysphoric mood. The patient is not nervous/anxious.        Occ awakens from heart fluttering       Objective:   Physical Exam  Constitutional: He is oriented to person, place, and time. He appears well-developed and well-nourished. No distress.  HENT:  Head: Normocephalic and atraumatic.  Right Ear: External ear normal.  Left Ear: External ear normal.  Mouth/Throat: Oropharynx is clear and moist. No oropharyngeal exudate.       TMs normal  Eyes: Conjunctivae and EOM are normal. Pupils are equal, round, and reactive to light.       Fundi benign  Neck: Normal range of motion. Neck supple. No thyromegaly present.  Cardiovascular: Normal rate, regular rhythm, normal heart sounds and intact distal pulses.  Exam reveals no gallop.   No murmur heard. Pulmonary/Chest: Effort normal and breath sounds normal. No respiratory distress. He has no wheezes. He has no rales.  Abdominal: Soft. There is no tenderness.  Musculoskeletal: Normal range of motion. He exhibits no edema and no tenderness.  Lymphadenopathy:    He has no cervical adenopathy.  Neurological: He is alert and oriented to person, place, and time.  Skin: No rash noted. No pallor.  Psychiatric: He has a normal mood and affect. His behavior is normal. Judgment and thought content normal.          Assessment & Plan:

## 2011-08-27 NOTE — Assessment & Plan Note (Signed)
Doing better now Wants to continue the med though

## 2011-08-27 NOTE — Assessment & Plan Note (Signed)
No problems with the med Due for labs 

## 2011-08-27 NOTE — Assessment & Plan Note (Signed)
Has some sense of brief spells Will review with Dr Graciela Husbands

## 2011-08-27 NOTE — Assessment & Plan Note (Signed)
Last echo showed only marginally low EF Dr Graciela Husbands follows

## 2011-09-03 ENCOUNTER — Telehealth: Payer: Self-pay | Admitting: Internal Medicine

## 2011-09-03 NOTE — Telephone Encounter (Signed)
Pt called, need results of labs, gave pt Phone Tree #. I told pt someone may call regarding his request..cdavis  Call back # [phone]

## 2011-09-03 NOTE — Telephone Encounter (Signed)
Patient lost phone tree card, spoke verbally about lab results

## 2011-09-07 ENCOUNTER — Ambulatory Visit (INDEPENDENT_AMBULATORY_CARE_PROVIDER_SITE_OTHER): Payer: BC Managed Care – PPO | Admitting: Internal Medicine

## 2011-09-07 ENCOUNTER — Encounter: Payer: Self-pay | Admitting: Internal Medicine

## 2011-09-07 ENCOUNTER — Encounter: Payer: BC Managed Care – PPO | Admitting: Emergency Medicine

## 2011-09-07 DIAGNOSIS — Z7901 Long term (current) use of anticoagulants: Secondary | ICD-10-CM

## 2011-09-07 DIAGNOSIS — I428 Other cardiomyopathies: Secondary | ICD-10-CM

## 2011-09-07 DIAGNOSIS — I4891 Unspecified atrial fibrillation: Secondary | ICD-10-CM

## 2011-09-07 DIAGNOSIS — I10 Essential (primary) hypertension: Secondary | ICD-10-CM

## 2011-09-07 MED ORDER — RIVAROXABAN 20 MG PO TABS: 20.0000 mg | ORAL_TABLET | Freq: Every day | ORAL | Status: DC

## 2011-09-07 NOTE — Assessment & Plan Note (Addendum)
We discussed the frequency of his atrial fibrillation and try to clarify whether his nocturnal palpitations are in fact that as that might define future treatment options. To that end we will use 30 day outpatient telemetry  In addition we discussed alternative oral anticoagulation regimes with the benefits and the risks compared to Coumadin of the 3 new drugs. He has a history of reflux so we will not pursue dabigitran  He would like to start on  Rivaroxaban  In the event that he was to pursue antiarrhythmic therapy, I recommend a sleep study

## 2011-09-07 NOTE — Progress Notes (Signed)
  HPI  [name] is a 46 y.o. male Seen in followup for atrial fibrillation that was newly identified a year ago. He underwent TEE guided cardioversion with restoration of sinus rhythm. Ejection fraction at that time was 30%. There is also concern about a bicuspid aortic valve. Repeat ultrasound  3 months later demonstrated marked improvement in his ejection fraction of 40-45%.  there was  NO evidence of bicuspid aortic valve.  The patient had some complaints of nonexertional chest discomfort and underwent Myoview scanning with an ejection fraction of  46% and some anterior wall redistribution.because of this, on August 2011 he underwent catheterization demonstrating normal  coronary arteries but modest left ventricular dysfunction  The patient denies chest pain, shortness of breath, nocturnal dyspnea, orthopnea or peripheral edema.    He has been having frequent palpitations particularly in the evenings and at night that are consistent with what he thinks is his atrial fibrillation    He snores but less than before  Past Medical History  Diagnosis Date  . Cardiomyopathy 08/2009    Mildly decreased EF  . Depression   . Gout   . Hypertension   . Bicuspid aortic valve     Last echo looks trileaflet  . Hyperlipidemia   . GERD (gastroesophageal reflux disease)   . Atrial fibrillation 1989    Past Surgical History  Procedure Date  . Heat stroke/seizures 1990s  . Cardioversion 1989  . Cardiac catheterization 03/2010    No sig coronary disease, EF ~40%    Current Outpatient Prescriptions  Medication Sig Dispense Refill  . atorvastatin (LIPITOR) 20 MG tablet take 1 tablet by mouth once daily  30 tablet  6  . benazepril (LOTENSIN) 10 MG tablet take 1 tablet by mouth once daily  30 tablet  6  . carvedilol (COREG) 12.5 MG tablet take 1 tablet by mouth twice a day  60 tablet  6  . colchicine 0.6 MG tablet Take 0.6 mg by mouth daily as needed.        Marland Kitchen FLUoxetine (PROZAC) 10  MG capsule Take 10 mg by mouth 3 (three) times daily.      Marland Kitchen warfarin (COUMADIN) 10 MG tablet Take as directed by anticoagulation clinic  35 tablet  0    No Known Allergies  Review of Systems negative except from HPI and PMH  Physical Exam Well developed and well nourished in no acute distress HENT normal E scleral and icterus clear Neck Supple JVP flat; carotids brisk and full Clear to ausculation Regular rate and rhythm, no murmurs gallops or rub Soft with active bowel sounds No clubbing cyanosis and edema Alert and oriented, grossly normal motor and sensory function Skin Warm and Dry    Assessment and  Plan

## 2011-09-07 NOTE — Assessment & Plan Note (Signed)
Well controled. 

## 2011-09-07 NOTE — Patient Instructions (Signed)
Stop taking your Coumadin. Your INR today is 1.6 so you can start this evening. Take one tablet every evening with dinner.  Call the office when you have new insurance and we can set up your Lifewatch for 30 days.

## 2011-09-07 NOTE — Assessment & Plan Note (Signed)
Continue current medications. 

## 2011-09-11 ENCOUNTER — Ambulatory Visit: Payer: BC Managed Care – PPO | Admitting: Internal Medicine

## 2011-09-11 ENCOUNTER — Encounter: Payer: BC Managed Care – PPO | Admitting: Emergency Medicine

## 2011-09-12 ENCOUNTER — Ambulatory Visit: Payer: BC Managed Care – PPO | Admitting: Internal Medicine

## 2011-09-12 ENCOUNTER — Encounter: Payer: BC Managed Care – PPO | Admitting: Emergency Medicine

## 2011-09-28 ENCOUNTER — Ambulatory Visit: Payer: BC Managed Care – PPO | Admitting: Internal Medicine

## 2011-11-13 ENCOUNTER — Telehealth: Payer: Self-pay | Admitting: Internal Medicine

## 2011-11-13 NOTE — Telephone Encounter (Signed)
Will forward to [facility] RN for Dr Graciela Husbands to follow up on this.  Reviewed dictation from 2/1 and 30 day outpatient telemetry ordered.

## 2011-11-13 NOTE — Telephone Encounter (Signed)
The [place] calls should be handled through triage even if they are Dr. Odessa Fleming patient's as I don't know anything about these patient's. You can check with the [facility] office and see if they ever received a monitor on this patient.

## 2011-11-13 NOTE — Telephone Encounter (Signed)
Spoke with [name] and [name] will call patient.  Will forward to her

## 2011-11-13 NOTE — Telephone Encounter (Signed)
Notified patient per Dr. Corlis Hove E-cardio monitor looked good. The patient wants to schedule a follow up with Dr. Graciela Husbands to discuss what is the next step.

## 2011-11-13 NOTE — Telephone Encounter (Signed)
Pt calling never got monitor result and was wondering if he needed to f/u

## 2011-11-27 ENCOUNTER — Encounter: Payer: Self-pay | Admitting: Internal Medicine

## 2011-11-27 ENCOUNTER — Other Ambulatory Visit: Payer: Self-pay | Admitting: Internal Medicine

## 2011-11-27 ENCOUNTER — Ambulatory Visit (INDEPENDENT_AMBULATORY_CARE_PROVIDER_SITE_OTHER): Payer: 59 | Admitting: Internal Medicine

## 2011-11-27 VITALS — BP 121/74 | HR 56 | Ht 71.0 in | Wt 239.2 lb

## 2011-11-27 DIAGNOSIS — I4891 Unspecified atrial fibrillation: Secondary | ICD-10-CM

## 2011-11-27 DIAGNOSIS — I4949 Other premature depolarization: Secondary | ICD-10-CM

## 2011-11-27 DIAGNOSIS — I10 Essential (primary) hypertension: Secondary | ICD-10-CM

## 2011-11-27 DIAGNOSIS — I472 Ventricular tachycardia: Secondary | ICD-10-CM | POA: Insufficient documentation

## 2011-11-27 DIAGNOSIS — I4729 Other ventricular tachycardia: Secondary | ICD-10-CM | POA: Insufficient documentation

## 2011-11-27 DIAGNOSIS — I428 Other cardiomyopathies: Secondary | ICD-10-CM

## 2011-11-27 DIAGNOSIS — I493 Ventricular premature depolarization: Secondary | ICD-10-CM

## 2011-11-27 NOTE — Assessment & Plan Note (Signed)
Is well compensated. Continue him on his Current medications. We'll reassess his LV function in 6 months.

## 2011-11-27 NOTE — Assessment & Plan Note (Addendum)
These correlate with his palpitations.METABOLIC profile drawn January was normal

## 2011-11-27 NOTE — Assessment & Plan Note (Signed)
No known intercurrent atrial fibrillation; continue anticoagulation

## 2011-11-27 NOTE — Patient Instructions (Signed)
Follow up with Dr. Graciela Husbands in 6 months. Need to have Echo prior to follow up.

## 2011-11-27 NOTE — Assessment & Plan Note (Signed)
Continue current medications. 

## 2011-11-27 NOTE — Progress Notes (Signed)
  HPI  [name] is a 46 y.o. male Seen in followup for palpitations with a prior history of atrial fibrillation and depressed left ventricular function. Ejection fraction had been about 30% when first presenting 2012 and with restoration of sinus rhythm there is improvement in his ejection fraction to 40-45%.  There was initial concern about a bicuspid aortic valve; subsequent echo looked normal  When we last saw him he was continued to complain about palpitations which he thought were similar to his atrial fibrillation. We have undertaken at that recorder which is reviewed below.  His exercise tolerance remains good. He is tolerating his medications without difficulty  Past Medical History  Diagnosis Date  . Cardiomyopathy 08/2009    Mildly decreased EF  . Depression   . Gout   . Hypertension   . Bicuspid aortic valve     Last echo looks trileaflet  . Hyperlipidemia   . GERD (gastroesophageal reflux disease)   . Atrial fibrillation 1989    Past Surgical History  Procedure Date  . Heat stroke/seizures 1990s  . Cardioversion 1989  . Cardiac catheterization 03/2010    No sig coronary disease, EF ~40%    Current Outpatient Prescriptions  Medication Sig Dispense Refill  . atorvastatin (LIPITOR) 20 MG tablet take 1 tablet by mouth once daily  30 tablet  6  . benazepril (LOTENSIN) 10 MG tablet take 1 tablet by mouth once daily  30 tablet  6  . carvedilol (COREG) 12.5 MG tablet take 1 tablet by mouth twice a day  60 tablet  6  . colchicine 0.6 MG tablet Take 0.6 mg by mouth daily as needed.        Marland Kitchen FLUoxetine (PROZAC) 10 MG capsule Take 10 mg by mouth 3 (three) times daily.      . Rivaroxaban (XARELTO) 20 MG TABS Take 20 mg by mouth daily.  30 tablet  6  . DISCONTD: carvedilol (COREG) 12.5 MG tablet take 1 tablet by mouth twice a day  60 tablet  6    No Known Allergies  Review of Systems negative except from HPI and PMH  Physical Exam BP 121/74  Pulse 56  Ht 5\' 11"   (1.803 m)  Wt 239 lb 4 oz (108.523 kg)  BMI 33.37 kg/m2 Well developed and well nourished in no acute distress HENT normal E scleral and icterus clear Neck Supple JVP flat; carotids brisk and full Clear to ausculation Regular rate and rhythm, no murmurs gallops or rub Soft with active bowel sounds No clubbing cyanosis none Edema Alert and oriented, grossly normal motor and sensory function Skin Warm and Dry  Event  recorder demonstrates PVCs correlating with symptoms and one run of AIVR  Assessment and  Plan

## 2011-11-28 ENCOUNTER — Ambulatory Visit: Payer: Self-pay | Admitting: Internal Medicine

## 2011-11-28 DIAGNOSIS — Z7901 Long term (current) use of anticoagulants: Secondary | ICD-10-CM

## 2011-11-28 DIAGNOSIS — I4891 Unspecified atrial fibrillation: Secondary | ICD-10-CM

## 2011-12-03 ENCOUNTER — Encounter: Payer: Self-pay | Admitting: Internal Medicine

## 2011-12-03 ENCOUNTER — Ambulatory Visit (INDEPENDENT_AMBULATORY_CARE_PROVIDER_SITE_OTHER): Payer: 59 | Admitting: Internal Medicine

## 2011-12-03 VITALS — BP 120/80 | HR 57 | Temp 98.0°F | Wt 244.0 lb

## 2011-12-03 DIAGNOSIS — S335XXA Sprain of ligaments of lumbar spine, initial encounter: Secondary | ICD-10-CM | POA: Insufficient documentation

## 2011-12-03 DIAGNOSIS — S336XXA Sprain of sacroiliac joint, initial encounter: Secondary | ICD-10-CM

## 2011-12-03 NOTE — Assessment & Plan Note (Signed)
No worrisome features Discussed supportive care---heat, ibuprofen Hold off on weight work for a while Land again if not better in a couple of weeks

## 2011-12-03 NOTE — Progress Notes (Signed)
  Subjective:    Patient ID: [name], male    DOB: April 01, 1966, 46 y.o.   MRN: [id number]  HPI Was putting mulch out on Saturday-2 days ago Remembers reaching over sink to reach pot---felt a twinge then in right low back Later that day, pain much worse and down the right leg Tried heat and heat wraps help Hot shower really helped Ibuprofen 800mg  tid also has helped Has been able to sleep okay  No right leg weakness Pain radiates down posterior thigh and calf Is some better  Stiff in AM then loosens up   Current Outpatient Prescriptions on File Prior to Visit  Medication Sig Dispense Refill  . atorvastatin (LIPITOR) 20 MG tablet take 1 tablet by mouth once daily  30 tablet  6  . benazepril (LOTENSIN) 10 MG tablet take 1 tablet by mouth once daily  30 tablet  6  . carvedilol (COREG) 12.5 MG tablet take 1 tablet by mouth twice a day  60 tablet  6  . colchicine 0.6 MG tablet Take 0.6 mg by mouth daily as needed.        . Rivaroxaban (XARELTO) 20 MG TABS Take 20 mg by mouth daily.  30 tablet  6  . DISCONTD: FLUoxetine (PROZAC) 10 MG capsule Take 10 mg by mouth 3 (three) times daily.        No Known Allergies  Past Medical History  Diagnosis Date  . Cardiomyopathy 08/2009    Mildly decreased EF  . Depression   . Gout   . Hypertension   . Bicuspid aortic valve     Last echo looks trileaflet  . Hyperlipidemia   . GERD (gastroesophageal reflux disease)   . Atrial fibrillation 1989    Past Surgical History  Procedure Date  . Heat stroke/seizures 1990s  . Cardioversion 1989  . Cardiac catheterization 03/2010    No sig coronary disease, EF ~40%    Family History  Problem Relation Age of Onset  . Heart attack Father     MI  . Diabetes Mother   . Depression Mother   . Arthritis Mother   . Coronary artery disease Paternal Grandfather   . Coronary artery disease Paternal Uncle   . Crohn's disease Brother   . Cancer Neg Hx   . Hypertension Neg Hx     History    Social History  . Marital Status: Married    Spouse Name: N/A    Number of Children: 1  . 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 Smoking status: Never Smoker   . Smokeless tobacco: Never Used  . Alcohol Use: Yes  . Drug Use: No  . Sexually Active:    Other Topics Concern  . Not on file   Social History Narrative   Married with 1 son   Review of Systems No change in bowel or bladder habits     Objective:   Physical Exam  Constitutional: He appears well-developed and well-nourished. No distress.  Musculoskeletal:       No spine tenderness (pain near right S-I) Pain with back flexion at 45degrees but can do down to 75 degrees SLR negative bilat  Neurological:       Gait normal No weakness in LE          Assessment & Plan:

## 2011-12-12 ENCOUNTER — Other Ambulatory Visit: Payer: Self-pay | Admitting: Internal Medicine

## 2012-03-21 ENCOUNTER — Other Ambulatory Visit: Payer: Self-pay | Admitting: Internal Medicine

## 2012-04-09 ENCOUNTER — Other Ambulatory Visit: Payer: Self-pay | Admitting: Internal Medicine

## 2012-04-22 ENCOUNTER — Other Ambulatory Visit: Payer: Self-pay | Admitting: Internal Medicine

## 2012-05-06 ENCOUNTER — Other Ambulatory Visit: Payer: Self-pay | Admitting: *Deleted

## 2012-05-06 MED ORDER — RIVAROXABAN 20 MG PO TABS: 20.0000 mg | ORAL_TABLET | Freq: Every day | ORAL | Status: DC

## 2012-05-06 NOTE — Telephone Encounter (Signed)
Refilled Xarelto. 

## 2012-06-03 ENCOUNTER — Ambulatory Visit (HOSPITAL_COMMUNITY): Payer: 59 | Attending: Cardiology | Admitting: Radiology

## 2012-06-03 ENCOUNTER — Ambulatory Visit (INDEPENDENT_AMBULATORY_CARE_PROVIDER_SITE_OTHER): Payer: 59 | Admitting: Internal Medicine

## 2012-06-03 ENCOUNTER — Encounter: Payer: Self-pay | Admitting: Internal Medicine

## 2012-06-03 VITALS — BP 130/84 | HR 68 | Ht 70.0 in | Wt 245.2 lb

## 2012-06-03 DIAGNOSIS — I10 Essential (primary) hypertension: Secondary | ICD-10-CM | POA: Insufficient documentation

## 2012-06-03 DIAGNOSIS — I059 Rheumatic mitral valve disease, unspecified: Secondary | ICD-10-CM | POA: Insufficient documentation

## 2012-06-03 DIAGNOSIS — I369 Nonrheumatic tricuspid valve disorder, unspecified: Secondary | ICD-10-CM | POA: Insufficient documentation

## 2012-06-03 DIAGNOSIS — I428 Other cardiomyopathies: Secondary | ICD-10-CM

## 2012-06-03 DIAGNOSIS — I4891 Unspecified atrial fibrillation: Secondary | ICD-10-CM

## 2012-06-03 NOTE — Assessment & Plan Note (Addendum)
Reasonably controlled at this time If his LV function is intact down, I will think about adding Aldactone

## 2012-06-03 NOTE — Assessment & Plan Note (Addendum)
Continue current medications; recent echo demonstrated mild worsening in LVEF from 40-30%. I will have one of the reader is comparison side-by-side to see if this is real. If so it is a little bit disconcerting. I have encouraged him again to avoid isometric exercise

## 2012-06-03 NOTE — Assessment & Plan Note (Signed)
Continue current meds 

## 2012-06-03 NOTE — Progress Notes (Signed)
Echocardiogram performed.  

## 2012-06-03 NOTE — Progress Notes (Signed)
Patient Care Team: Karie Schwalbe, MD as PCP - General   HPI  [name] is a 46 y.o. male Seen in followup for atrial fibrillation that was newly identified a year ago. He underwent TEE guided cardioversion with restoration of sinus rhythm. Ejection fraction at that time was 30%. There is also concern about a bicuspid aortic valve.  Repeat ultrasound 3 months later demonstrated marked improvement in his ejection fraction of 40-45%. there was NO evidence of bicuspid aortic valve.  The patient had some complaints of nonexertional chest discomfort and underwent Myoview scanning with an ejection fraction of 46% and some anterior wall redistribution. Because of this, on August 2011 he underwent catheterization demonstrating normal  coronary arteries but modest left ventricular dysfunction   He has been doing really quite well. He has had an episode of chest pain that radiated into his, that responded to Maalox or some such thing. He's had some chronic scapular discomfort in his lower orthostatic intolerance.  Laboratories January 2012 demonstrated normal renal function     Past Medical History  Diagnosis Date  . Cardiomyopathy 08/2009    Mildly decreased EF  . Depression   . Gout   . Hypertension   . Bicuspid aortic valve     Last echo looks trileaflet  . Hyperlipidemia   . GERD (gastroesophageal reflux disease)   . Atrial fibrillation 1989    Past Surgical History  Procedure Date  . Heat stroke/seizures 1990s  . Cardioversion 1989  . Cardiac catheterization 03/2010    No sig coronary disease, EF ~40%    Current Outpatient Prescriptions  Medication Sig Dispense Refill  . atorvastatin (LIPITOR) 20 MG tablet take 1 tablet by mouth once daily  30 tablet  6  . benazepril (LOTENSIN) 10 MG tablet take 1 tablet by mouth once daily  30 tablet  6  . carvedilol (COREG) 12.5 MG tablet take 1 tablet by mouth twice a day  60 tablet  6  . colchicine 0.6 MG tablet Take 0.6 mg by mouth  daily as needed.        Marland Kitchen FLUoxetine (PROZAC) 20 MG capsule       . FLUoxetine (PROZAC) 20 MG capsule take 3 capsules by mouth once daily  90 capsule  3  . Rivaroxaban (XARELTO) 20 MG TABS Take 1 tablet (20 mg total) by mouth daily.  30 tablet  6    No Known Allergies  Review of Systems negative except from HPI and PMH  Physical Exam BP 130/84  Pulse 68  Ht 5\' 10"  (1.778 m)  Wt 245 lb 4 oz (111.245 kg)  BMI 35.19 kg/m2 Well developed and nourished in no acute distress HENT normal Neck supple with JVP-flat Carotids brisk and full without bruits Clear Regular rate and rhythm, no murmurs or gallops Abd-soft with active BS without hepatomegaly No Clubbing cyanosis edema Skin-warm and dry A & Oriented  Grossly normal sensory and motor function  Electrocardiogram demonstrates sinus rhythm at 68 Intervals 16/10/39 Otherwise normal   Assessment and  Plan

## 2012-06-03 NOTE — Patient Instructions (Addendum)
Your physician wants you to follow-up in: 1 year.   You will receive a reminder letter in the mail two months in advance. If you don't receive a letter, please call our office to schedule the follow-up appointment.  Your physician has recommended that you have a sleep study. This test records several body functions during sleep, including: brain activity, eye movement, oxygen and carbon dioxide blood levels, heart rate and rhythm, breathing rate and rhythm, the flow of air through your mouth and nose, snoring, body muscle movements, and chest and belly movement.

## 2012-07-07 ENCOUNTER — Telehealth: Payer: Self-pay | Admitting: Internal Medicine

## 2012-07-07 NOTE — Telephone Encounter (Signed)
lmtcb

## 2012-07-07 NOTE — Telephone Encounter (Signed)
Pt calling for sleep study results ordered by Dr Graciela Husbands

## 2012-07-10 NOTE — Telephone Encounter (Signed)
Pt called back Informed of sleep study results Understanding verb

## 2012-07-16 ENCOUNTER — Ambulatory Visit (INDEPENDENT_AMBULATORY_CARE_PROVIDER_SITE_OTHER): Payer: 59 | Admitting: Internal Medicine

## 2012-07-16 ENCOUNTER — Encounter: Payer: Self-pay | Admitting: Internal Medicine

## 2012-07-16 VITALS — BP 110/78 | HR 69 | Temp 97.7°F | Wt 247.0 lb

## 2012-07-16 DIAGNOSIS — S336XXA Sprain of sacroiliac joint, initial encounter: Secondary | ICD-10-CM

## 2012-07-16 DIAGNOSIS — S335XXA Sprain of ligaments of lumbar spine, initial encounter: Secondary | ICD-10-CM

## 2012-07-16 MED ORDER — PREDNISONE 20 MG PO TABS: 40.0000 mg | ORAL_TABLET | Freq: Every day | ORAL | Status: DC

## 2012-07-16 MED ORDER — HYDROCODONE-ACETAMINOPHEN 5-325 MG PO TABS: 1.0000 | ORAL_TABLET | Freq: Four times a day (QID) | ORAL | Status: DC | PRN

## 2012-07-16 MED ORDER — CYCLOBENZAPRINE HCL 10 MG PO TABS: 10.0000 mg | ORAL_TABLET | Freq: Every evening | ORAL | Status: DC | PRN

## 2012-07-16 NOTE — Patient Instructions (Signed)
Call for reevaluation if you are not significantly better in 2 weeks

## 2012-07-16 NOTE — Progress Notes (Signed)
  Subjective:    Patient ID: [name], male    DOB: [birth year], 46 y.o.   MRN: [id number]  HPI Hurt his back  On Sunday, just fooling around with wife Showing her how to tackle someone Picked her up without bending knees Noticed immediate pain on right low back mostly Really bad if he bends down Pain when turning in bed---wakes him up  Using ibuprofen 800mg  tid also Using heating pad Using wife's oxycodone which helped some  Current Outpatient Prescriptions on File Prior to Visit  Medication Sig Dispense Refill  . atorvastatin (LIPITOR) 20 MG tablet take 1 tablet by mouth once daily  30 tablet  6  . benazepril (LOTENSIN) 10 MG tablet take 1 tablet by mouth once daily  30 tablet  6  . carvedilol (COREG) 12.5 MG tablet take 1 tablet by mouth twice a day  60 tablet  6  . colchicine 0.6 MG tablet Take 0.6 mg by mouth daily as needed.        Marland Kitchen FLUoxetine (PROZAC) 20 MG capsule take 3 capsules by mouth once daily  90 capsule  3  . Rivaroxaban (XARELTO) 20 MG TABS Take 1 tablet (20 mg total) by mouth daily.  30 tablet  6  . [DISCONTINUED] FLUoxetine (PROZAC) 20 MG capsule         No Known Allergies  Past Medical History  Diagnosis Date  . Cardiomyopathy 08/2009    Mildly decreased EF  . Depression   . Gout   . Hypertension   . Bicuspid aortic valve     Last echo looks trileaflet  . Hyperlipidemia   . GERD (gastroesophageal reflux disease)   . Atrial fibrillation 1989    Past Surgical History  Procedure Date  . Heat stroke/seizures 1990s  . Cardioversion 1989  . Cardiac catheterization 03/2010    No sig coronary disease, EF ~40%    Family History  Problem Relation Age of Onset  . Heart attack Father     MI  . Diabetes Mother   . Depression Mother   . Arthritis Mother   . Coronary artery disease Paternal Grandfather   . Coronary artery disease Paternal Uncle   . Crohn's disease Brother   . Cancer Neg Hx   . Hypertension Neg Hx     History   Social  History  . Marital Status: Married    Spouse Name: N/A    Number of Children: 1  . 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 Smoking status: Never Smoker   . Smokeless tobacco: Never Used  . Alcohol Use: Yes  . Drug Use: No  . Sexually Active:    Other Topics Concern  . Not on file   Social History Narrative   Married with 1 son   Review of Systems No leg weakness--but limited due to pain No change in bowel or bladder (other than constipation with the oxycodone)     Objective:   Physical Exam  Constitutional: He appears well-developed and well-nourished. No distress.  Musculoskeletal:       Tenderness along right lumbar paraspinals--esp ~L4 SLR positive quickly on right but negative on left Very little back flexion before pain  Neurological:       No focal weakness in legs Very slow antalgic gait          Assessment & Plan:

## 2012-07-16 NOTE — Assessment & Plan Note (Signed)
HNP is unlikely but still possible Probably just muscle/ligament Okay if he wants to try chiropractic Will try course of prednisone Cyclobenzaprine, hydrocodone

## 2012-07-17 ENCOUNTER — Other Ambulatory Visit: Payer: Self-pay

## 2012-07-17 MED ORDER — CARVEDILOL 12.5 MG PO TABS: 12.5000 mg | ORAL_TABLET | Freq: Two times a day (BID) | ORAL | Status: DC

## 2012-08-29 ENCOUNTER — Other Ambulatory Visit: Payer: Self-pay | Admitting: Internal Medicine

## 2012-10-24 ENCOUNTER — Ambulatory Visit: Payer: 59 | Admitting: Internal Medicine

## 2012-10-31 ENCOUNTER — Encounter: Payer: Self-pay | Admitting: Internal Medicine

## 2012-10-31 ENCOUNTER — Other Ambulatory Visit: Payer: Self-pay | Admitting: Internal Medicine

## 2012-10-31 ENCOUNTER — Ambulatory Visit (INDEPENDENT_AMBULATORY_CARE_PROVIDER_SITE_OTHER): Payer: 59 | Admitting: Internal Medicine

## 2012-10-31 ENCOUNTER — Ambulatory Visit (INDEPENDENT_AMBULATORY_CARE_PROVIDER_SITE_OTHER)
Admission: RE | Admit: 2012-10-31 | Discharge: 2012-10-31 | Disposition: A | Discharge status: Home / Self Care | Payer: 59 | Source: Ambulatory Visit | Attending: Internal Medicine | Admitting: Internal Medicine

## 2012-10-31 VITALS — BP 110/70 | HR 56 | Temp 98.2°F | Wt 240.0 lb

## 2012-10-31 DIAGNOSIS — R1031 Right lower quadrant pain: Secondary | ICD-10-CM

## 2012-10-31 DIAGNOSIS — I428 Other cardiomyopathies: Secondary | ICD-10-CM

## 2012-10-31 DIAGNOSIS — M109 Gout, unspecified: Secondary | ICD-10-CM

## 2012-10-31 DIAGNOSIS — I4891 Unspecified atrial fibrillation: Secondary | ICD-10-CM

## 2012-10-31 DIAGNOSIS — M546 Pain in thoracic spine: Secondary | ICD-10-CM

## 2012-10-31 DIAGNOSIS — Z23 Encounter for immunization: Secondary | ICD-10-CM

## 2012-10-31 DIAGNOSIS — E785 Hyperlipidemia, unspecified: Secondary | ICD-10-CM

## 2012-10-31 DIAGNOSIS — I10 Essential (primary) hypertension: Secondary | ICD-10-CM

## 2012-10-31 DIAGNOSIS — Z0000 Encounter for general adult medical examination without abnormal findings: Secondary | ICD-10-CM

## 2012-10-31 LAB — CBC WITH DIFFERENTIAL/PLATELET
Basophils Absolute: 0 10*3/uL (ref 0.0–0.1)
HCT: 39.3 % (ref 39.0–52.0)
Lymphs Abs: 1.6 10*3/uL (ref 0.7–4.0)
MCV: 91.5 fl (ref 78.0–100.0)
Monocytes Absolute: 0.5 10*3/uL (ref 0.1–1.0)
Monocytes Relative: 9.5 % (ref 3.0–12.0)
Platelets: 260 10*3/uL (ref 150.0–400.0)
RDW: 13 % (ref 11.5–14.6)

## 2012-10-31 LAB — C-REACTIVE PROTEIN: CRP: <0.5 mg/dL (ref 0.5–20.0)

## 2012-10-31 LAB — LIPID PANEL
LDL Cholesterol: 111 mg/dL — ABNORMAL HIGH (ref 0–99)
Total CHOL/HDL Ratio: 4
Triglycerides: 139 mg/dL (ref 0.0–149.0)

## 2012-10-31 LAB — BASIC METABOLIC PANEL
BUN: 21 mg/dL (ref 6–23)
Creatinine, Ser: 1.2 mg/dL (ref 0.4–1.5)
GFR: 67.31 mL/min (ref >60.00)
Glucose, Bld: 101 mg/dL — ABNORMAL HIGH (ref 70–99)
Potassium: 4.8 mEq/L (ref 3.5–5.1)

## 2012-10-31 LAB — TSH: TSH: 2.91 u[IU]/mL (ref 0.35–5.50)

## 2012-10-31 LAB — HEPATIC FUNCTION PANEL: Albumin: 4.6 g/dL (ref 3.5–5.2)

## 2012-10-31 NOTE — Assessment & Plan Note (Signed)
Will check ESR and CRP If negative, check UGI with SBFT to be sure ileum is normal If abnormal, GI eval

## 2012-10-31 NOTE — Assessment & Plan Note (Signed)
Unclear cause CXR looks fine ?chest wall symptoms

## 2012-10-31 NOTE — Assessment & Plan Note (Signed)
Regular now Gets palps at times Is on xarelto
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
## 2012-10-31 NOTE — Assessment & Plan Note (Signed)
Stable status No changes needed 

## 2012-10-31 NOTE — Assessment & Plan Note (Signed)
Multiple bothersome things Seems generally healthy Does need to lose some more weight Tdap today

## 2012-11-03 ENCOUNTER — Other Ambulatory Visit: Payer: Self-pay | Admitting: *Deleted

## 2012-11-03 MED ORDER — ATORVASTATIN CALCIUM 20 MG PO TABS: 20.0000 mg | ORAL_TABLET | Freq: Every day | ORAL | Status: DC

## 2012-11-07 ENCOUNTER — Ambulatory Visit: Payer: Self-pay | Admitting: Internal Medicine

## 2012-11-07 ENCOUNTER — Telehealth: Payer: Self-pay | Admitting: Internal Medicine

## 2012-11-07 DIAGNOSIS — R1031 Right lower quadrant pain: Secondary | ICD-10-CM

## 2012-11-07 DIAGNOSIS — K7689 Other specified diseases of liver: Secondary | ICD-10-CM

## 2012-11-07 NOTE — Telephone Encounter (Signed)
Pt is requesting a referral to a doctor due to liver issues he says you are aware of. Can you send a referral? Or do you need him to come in for an office visit? Thank you.

## 2012-11-10 ENCOUNTER — Encounter: Payer: Self-pay | Admitting: Gastroenterology

## 2012-11-11 ENCOUNTER — Other Ambulatory Visit: Payer: Self-pay | Admitting: Cardiology

## 2012-11-11 MED ORDER — BENAZEPRIL HCL 10 MG PO TABS: 10.0000 mg | ORAL_TABLET | Freq: Every day | ORAL | Status: DC

## 2012-11-26 ENCOUNTER — Encounter: Payer: Self-pay | Admitting: Internal Medicine

## 2012-12-02 ENCOUNTER — Other Ambulatory Visit: Payer: Self-pay | Admitting: *Deleted

## 2012-12-02 MED ORDER — RIVAROXABAN 20 MG PO TABS: 20.0000 mg | ORAL_TABLET | Freq: Every day | ORAL | Status: DC

## 2012-12-02 NOTE — Telephone Encounter (Signed)
Refilled Xarelto sent to [facility].

## 2012-12-03 ENCOUNTER — Ambulatory Visit: Payer: 59 | Admitting: Gastroenterology

## 2012-12-23 ENCOUNTER — Ambulatory Visit: Payer: 59 | Admitting: Gastroenterology

## 2012-12-29 ENCOUNTER — Other Ambulatory Visit: Payer: Self-pay | Admitting: Internal Medicine

## 2013-01-14 ENCOUNTER — Ambulatory Visit: Payer: 59 | Admitting: Gastroenterology

## 2013-02-02 ENCOUNTER — Other Ambulatory Visit: Payer: Self-pay | Admitting: *Deleted

## 2013-02-02 MED ORDER — CARVEDILOL 12.5 MG PO TABS: 12.5000 mg | ORAL_TABLET | Freq: Two times a day (BID) | ORAL | Status: DC

## 2013-02-09 ENCOUNTER — Ambulatory Visit: Payer: 59 | Admitting: Gastroenterology

## 2013-02-20 ENCOUNTER — Emergency Department: Payer: Self-pay | Admitting: Emergency Medicine

## 2013-02-20 LAB — CBC
HCT: 38.2 % — ABNORMAL LOW (ref 40.0–52.0)
HGB: 13.1 g/dL (ref 13.0–18.0)
MCHC: 34.1 g/dL (ref 32.0–36.0)
MCV: 90 fL (ref 80–100)
Platelet: 209 10*3/uL (ref 150–440)
RDW: 13 % (ref 11.5–14.5)
WBC: 6.6 10*3/uL (ref 3.8–10.6)

## 2013-02-20 LAB — BASIC METABOLIC PANEL
BUN: 23 mg/dL — ABNORMAL HIGH (ref 7–18)
Calcium, Total: 8.9 mg/dL (ref 8.5–10.1)
Chloride: 104 mmol/L (ref 98–107)
Creatinine: 1.2 mg/dL (ref 0.60–1.30)
Glucose: 100 mg/dL — ABNORMAL HIGH (ref 65–99)
Osmolality: 279 (ref 275–301)
Potassium: 3.9 mmol/L (ref 3.5–5.1)
Sodium: 138 mmol/L (ref 136–145)

## 2013-02-20 LAB — TROPONIN I: Troponin-I: <0.02 ng/mL

## 2013-04-07 ENCOUNTER — Encounter: Payer: Self-pay | Admitting: Internal Medicine

## 2013-04-07 ENCOUNTER — Ambulatory Visit (INDEPENDENT_AMBULATORY_CARE_PROVIDER_SITE_OTHER): Payer: 59 | Admitting: *Deleted

## 2013-04-07 ENCOUNTER — Telehealth: Payer: Self-pay | Admitting: Internal Medicine

## 2013-04-07 VITALS — BP 108/71 | HR 57 | Ht 71.0 in | Wt 232.8 lb

## 2013-04-07 DIAGNOSIS — R002 Palpitations: Secondary | ICD-10-CM

## 2013-04-07 NOTE — Telephone Encounter (Signed)
Pt complaining that after 30 mile bike ride yesterday he has felt like his heart has been skipping beats since then. Unlike other times where it would go away after a little while. He is agreeable to coming in for an EKG before he leaves to go out of town this afternoon (he will not return till Thursday evening).

## 2013-04-07 NOTE — Telephone Encounter (Addendum)
New Problem  Pt states that is feels like his heart is skipping beats and it has been going on since yesterday evening.

## 2013-04-07 NOTE — Progress Notes (Signed)
EKG normal, with HR 57. No PVCs observed. Pt leaving for out of town early this afternoon and will be gone until Thursday. Pt instructed to keep [place] informed, and I will follow up with Dr. Graciela Husbands next week when he returns to discuss possible follow up.

## 2013-04-07 NOTE — Telephone Encounter (Signed)
Follow up  Pt would like to speak with a nurse regarding his heart skipping beats.

## 2013-04-13 NOTE — Telephone Encounter (Signed)
New Problem,  Pt states he was advised to speak w/ a nurse for a work in////made appt for 11/3 with klein to be on safe side if you could not find room to work pt in.

## 2013-04-13 NOTE — Progress Notes (Unsigned)
See docu note  °

## 2013-04-14 ENCOUNTER — Telehealth: Payer: Self-pay

## 2013-04-14 NOTE — Telephone Encounter (Signed)
Spoke w/ pt.  States that his heart "skipped" last month and has noticed that it is happening more frequently, particularly after he exercises.   Is a pt of Dr. Graciela Husbands, had EKG last week but states that it was normal.   Sched to see [place], [place] 9/12 @ 2:30.

## 2013-04-14 NOTE — Telephone Encounter (Signed)
Follow Up  Pt following up from previous message

## 2013-04-16 ENCOUNTER — Encounter: Payer: Self-pay | Admitting: Cardiovascular Disease

## 2013-04-16 ENCOUNTER — Ambulatory Visit (INDEPENDENT_AMBULATORY_CARE_PROVIDER_SITE_OTHER): Payer: 59 | Admitting: Cardiovascular Disease

## 2013-04-16 ENCOUNTER — Telehealth: Payer: Self-pay | Admitting: *Deleted

## 2013-04-16 VITALS — BP 123/75 | HR 56 | Ht 71.0 in | Wt 236.8 lb

## 2013-04-16 DIAGNOSIS — I472 Ventricular tachycardia: Secondary | ICD-10-CM

## 2013-04-16 DIAGNOSIS — I4949 Other premature depolarization: Secondary | ICD-10-CM

## 2013-04-16 DIAGNOSIS — I499 Cardiac arrhythmia, unspecified: Secondary | ICD-10-CM

## 2013-04-16 DIAGNOSIS — R079 Chest pain, unspecified: Secondary | ICD-10-CM

## 2013-04-16 DIAGNOSIS — I428 Other cardiomyopathies: Secondary | ICD-10-CM

## 2013-04-16 DIAGNOSIS — I493 Ventricular premature depolarization: Secondary | ICD-10-CM

## 2013-04-16 DIAGNOSIS — I4891 Unspecified atrial fibrillation: Secondary | ICD-10-CM

## 2013-04-16 DIAGNOSIS — I459 Conduction disorder, unspecified: Secondary | ICD-10-CM

## 2013-04-16 DIAGNOSIS — I4729 Other ventricular tachycardia: Secondary | ICD-10-CM

## 2013-04-16 NOTE — Telephone Encounter (Signed)
Follow Up ° °Pt returned call//  °

## 2013-04-16 NOTE — Telephone Encounter (Signed)
Follow up with patient who explains he is going to [facility] for doctor visit, because he could not get in with Dr. Graciela Husbands and because he is still having same issues from last month or so. I explained I would take a look at note after he was seen and told him to call if he needed anything.

## 2013-04-16 NOTE — Assessment & Plan Note (Signed)
He has no history of symptomatic palpitations but it appears that there has been significant worsening over the last few months of unclear etiology. He has been exercising more and there is possibility of electrolyte abnormalities related to excessive sweating.  I will obtain basic labs today including basic metabolic profile, magnesium and thyroid panel. I advised him to cut down on caffeine intake more than one cup of coffee a day. I will request a 48-hour Holter monitor to quantify the PVCs and see if further antiarrhythmic therapy is needed given his underlying cardiomyopathy. I will have him followup with Dr. Graciela Husbands. Ejection fraction has been around 35-40%. Ejection fraction last year was reported to be 30-35%. It might be worth to obtain a cardiac MRI to accurately assess his ejection fraction but I will leave that up to Dr. Graciela Husbands.

## 2013-04-16 NOTE — Patient Instructions (Addendum)
Labs today. - magnesium, bmp, tsh  Your physician has recommended that you wear a 48 hour holter monitor. Holter monitors are medical devices that record the heart's electrical activity. Doctors most often use these monitors to diagnose arrhythmias. Arrhythmias are problems with the speed or rhythm of the heartbeat. The monitor is a small, portable device. You can wear one while you do your normal daily activities. This is usually used to diagnose what is causing palpitations/syncope (passing out). - [facility] will contact you about placing the monitor.  Follow up with Dr. Graciela Husbands after the  Holter monitor.

## 2013-04-16 NOTE — Assessment & Plan Note (Signed)
Continue treatment with carvedilol and benazepril. He does not appear to have significant limitations related to this. No signs of fluid overload.

## 2013-04-16 NOTE — Assessment & Plan Note (Signed)
No recurrent arrhythmia. 

## 2013-04-16 NOTE — Telephone Encounter (Signed)
Left message to follow up with patient after [facility] doctor visit and see how he was.  Will try again tomorrow to contact him

## 2013-04-16 NOTE — Progress Notes (Signed)
HPI  This is a 47 year old male who is here today for evaluation of palpitations. He is normally followed by Dr. Graciela Husbands but he requested an urgent appointment which could not be arranged with Dr. Graciela Husbands.  He has known history of atrial fibrillation with previous cardioversion.  Ejection fraction at that time was 30%. There is also concern about a bicuspid aortic valve.  Repeat ultrasound 3 months later demonstrated mild improvement in his ejection fraction of 40-45%. there was NO evidence of bicuspid aortic valve.  The patient had some complaints of nonexertional chest discomfort and underwent Myoview scanning with an ejection fraction of 46% and some anterior wall redistribution. Because of this, in August 2011 he underwent catheterization demonstrating normal coronary arteries with an ejection fraction of 40%.  He has been exercising on a regular basis. He denies any worsening dyspnea or fatigue. Over the last few months, he has been having increased palpitations and skipping in his heart most noticeable at night when he is trying to sleep. This makes him extremely anxious. His wife has since his heart again hear the skipping on an average of 3-5 beats in a minute. He gets slightly dizzy but that has been no atypia or presyncope. He drinks 3 cups of coffee a day.   No Known Allergies   Current Outpatient Prescriptions on File Prior to Visit  Medication Sig Dispense Refill  . atorvastatin (LIPITOR) 20 MG tablet Take 1 tablet (20 mg total) by mouth daily.  90 tablet  3  . benazepril (LOTENSIN) 10 MG tablet Take 1 tablet (10 mg total) by mouth daily.  30 tablet  6  . carvedilol (COREG) 12.5 MG tablet Take 1 tablet (12.5 mg total) by mouth 2 (two) times daily with a meal.  60 tablet  4  . colchicine 0.6 MG tablet Take 0.6 mg by mouth daily as needed.        Marland Kitchen FLUoxetine (PROZAC) 20 MG capsule take 3 capsules by mouth once daily  90 capsule  3  . Rivaroxaban (XARELTO) 20 MG TABS Take 1 tablet (20  mg total) by mouth daily.  30 tablet  6   No current facility-administered medications on file prior to visit.     Past Medical History  Diagnosis Date  . Cardiomyopathy 08/2009    Mildly decreased EF  . Depression   . Gout   . Hypertension   . Bicuspid aortic valve     Last echo looks trileaflet  . Hyperlipidemia   . GERD (gastroesophageal reflux disease)   . Atrial fibrillation 1989     Past Surgical History  Procedure Laterality Date  . Heat stroke/seizures  1990s  . Cardioversion  1989  . Cardiac catheterization  03/2010    No sig coronary disease, EF ~40%     Family History  Problem Relation Age of Onset  . Heart attack Father     MI  . Diabetes Mother   . Depression Mother   . Arthritis Mother   . Coronary artery disease Paternal Grandfather   . Coronary artery disease Paternal Uncle   . Crohn's disease Brother   . Cancer Neg Hx   . Hypertension Neg Hx      History   Social History  . Marital Status: Married    Spouse Name: N/A    Number of Children: 1  . 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 Smoking status: Never Smoker   . Smokeless tobacco: Never Used  . Alcohol Use: Yes     Comment: occassional  . Drug Use: No  . Sexual Activity: Not on file   Other Topics Concern  . Not on file   Social History Narrative   Married with 1 son      PHYSICAL EXAM   BP 123/75  Pulse 56  Ht 5\' 11"  (1.803 m)  Wt 236 lb 12 oz (107.389 kg)  BMI 33.03 kg/m2 Constitutional: He is oriented to person, place, and time. He appears well-developed and well-nourished. No distress.  HENT: No nasal discharge.  Head: Normocephalic and atraumatic.  Eyes: Pupils are equal and round. Right eye exhibits no discharge. Left eye exhibits no discharge.  Neck: Normal range of motion. Neck supple. No JVD present. No thyromegaly present.  Cardiovascular: Normal rate, regular  rhythm, normal heart sounds . Exam reveals no gallop and no friction rub. No murmur heard.  Pulmonary/Chest: Effort normal and breath sounds normal. No stridor. No respiratory distress. He has no wheezes. He has no rales. He exhibits no tenderness.  Abdominal: Soft. Bowel sounds are normal. He exhibits no distension. There is no tenderness. There is no rebound and no guarding.  Musculoskeletal: Normal range of motion. He exhibits no edema and no tenderness.  Neurological: He is alert and oriented to person, place, and time. Coordination normal.  Skin: Skin is warm and dry. No rash noted. He is not diaphoretic. No erythema. No pallor.  Psychiatric: He has a normal mood and affect. His behavior is normal. Judgment and thought content normal.       EKG: Sinus  Bradycardia  - occasional ectopic ventricular beat    WITHIN NORMAL LIMITS   ASSESSMENT AND PLAN

## 2013-04-17 ENCOUNTER — Ambulatory Visit: Payer: 59 | Admitting: Physician Assistant

## 2013-04-17 LAB — TSH: TSH: 4.52 u[IU]/mL — ABNORMAL HIGH (ref 0.450–4.500)

## 2013-04-17 LAB — BASIC METABOLIC PANEL
BUN: 18 mg/dL (ref 6–24)
CO2: 24 mmol/L (ref 18–29)
Chloride: 97 mmol/L (ref 97–108)
Creatinine, Ser: 1.06 mg/dL (ref 0.76–1.27)
GFR calc Af Amer: 97 mL/min/{1.73_m2} (ref >59)
Glucose: 80 mg/dL (ref 65–99)

## 2013-04-17 LAB — MAGNESIUM: Magnesium: 2.1 mg/dL (ref 1.6–2.6)

## 2013-04-21 ENCOUNTER — Telehealth: Payer: Self-pay

## 2013-04-21 DIAGNOSIS — R7989 Other specified abnormal findings of blood chemistry: Secondary | ICD-10-CM

## 2013-04-21 NOTE — Telephone Encounter (Signed)
Message copied by Marilynne Halsted on Tue Apr 21, 2013  4:26 PM ------      Message from: Lorine Bears A      Created: Mon Apr 20, 2013  9:30 AM       Inform patient that labs were normal. TSH was borderline high but not enough to be causing problems. Let's check free T3 and free T4 to complete thyroid evaluation. ------

## 2013-04-21 NOTE — Telephone Encounter (Signed)
Pt informed of results.  He will be out of town next week and will call us at his convenience to come in for lab draw.

## 2013-04-25 ENCOUNTER — Other Ambulatory Visit: Payer: Self-pay | Admitting: Internal Medicine

## 2013-04-29 ENCOUNTER — Ambulatory Visit (INDEPENDENT_AMBULATORY_CARE_PROVIDER_SITE_OTHER): Payer: 59

## 2013-04-29 DIAGNOSIS — I4891 Unspecified atrial fibrillation: Secondary | ICD-10-CM

## 2013-05-01 ENCOUNTER — Telehealth: Payer: Self-pay | Admitting: *Deleted

## 2013-05-01 NOTE — Telephone Encounter (Signed)
Followed up with patient to see how he has been doing - he states that previous symptoms have improved. We discussed his holter monitor results and lab results. Patient to follow up with Dr. Graciela Husbands next week

## 2013-05-07 ENCOUNTER — Ambulatory Visit: Payer: 59 | Admitting: Internal Medicine

## 2013-05-08 ENCOUNTER — Ambulatory Visit: Payer: 59 | Admitting: Internal Medicine

## 2013-05-11 ENCOUNTER — Ambulatory Visit (INDEPENDENT_AMBULATORY_CARE_PROVIDER_SITE_OTHER): Payer: 59 | Admitting: Internal Medicine

## 2013-05-11 ENCOUNTER — Encounter: Payer: Self-pay | Admitting: Internal Medicine

## 2013-05-11 VITALS — BP 124/74 | HR 60 | Ht 71.0 in | Wt 239.0 lb

## 2013-05-11 DIAGNOSIS — I499 Cardiac arrhythmia, unspecified: Secondary | ICD-10-CM

## 2013-05-11 DIAGNOSIS — I428 Other cardiomyopathies: Secondary | ICD-10-CM

## 2013-05-11 DIAGNOSIS — I459 Conduction disorder, unspecified: Secondary | ICD-10-CM

## 2013-05-11 DIAGNOSIS — I4949 Other premature depolarization: Secondary | ICD-10-CM

## 2013-05-11 DIAGNOSIS — I4891 Unspecified atrial fibrillation: Secondary | ICD-10-CM

## 2013-05-11 DIAGNOSIS — I493 Ventricular premature depolarization: Secondary | ICD-10-CM

## 2013-05-11 NOTE — Progress Notes (Signed)
Patient Care Team: Karie Schwalbe, MD as PCP - General   HPI  [name] is a 47 y.o. male  Seen in followup for atrial fibrillation that was newly identified a year ago. He underwent TEE guided cardioversion with restoration of sinus rhythm. Ejection fraction at that time was 30%. There is also concern about a bicuspid aortic valve.  Repeat ultrasound 3 months later demonstrated marked improvement in his ejection fraction of 40-45%. there was NO evidence of bicuspid aortic valve .   He was seen for palpitations by Dr. Marcheta Grammes last month. A Holter monitor-48 hours demonstrated frequent PVCs -- about 2000 it in one percent  TSH was borderline elevated ; electrolytes were normal  His PVCs have been much better since having uses coffee from 4--1 per day. He is exercising vigorously running 5 miles riding his bike 15-20 miles.  Past Medical History  Diagnosis Date  . Cardiomyopathy 08/2009    Mildly decreased EF  . Depression   . Gout   . Hypertension   . Bicuspid aortic valve     Last echo looks trileaflet  . Hyperlipidemia   . GERD (gastroesophageal reflux disease)   . Atrial fibrillation 1989    Past Surgical History  Procedure Laterality Date  . Heat stroke/seizures  1990s  . Cardioversion  1989  . Cardiac catheterization  03/2010    No sig coronary disease, EF ~40%    Current Outpatient Prescriptions  Medication Sig Dispense Refill  . atorvastatin (LIPITOR) 20 MG tablet Take 1 tablet (20 mg total) by mouth daily.  90 tablet  3  . benazepril (LOTENSIN) 10 MG tablet Take 1 tablet (10 mg total) by mouth daily.  30 tablet  6  . carvedilol (COREG) 12.5 MG tablet Take 1 tablet (12.5 mg total) by mouth 2 (two) times daily with a meal.  60 tablet  4  . colchicine 0.6 MG tablet Take 0.6 mg by mouth daily as needed.        Marland Kitchen FLUoxetine (PROZAC) 20 MG capsule take 3 capsules by mouth once daily  90 capsule  3  . Multiple Vitamin (MULTIVITAMIN) tablet Take 1 tablet by mouth daily.       . Rivaroxaban (XARELTO) 20 MG TABS Take 1 tablet (20 mg total) by mouth daily.  30 tablet  6   No current facility-administered medications for this visit.    No Known Allergies  Review of Systems negative except from HPI and PMH  Physical Exam BP 124/74  Pulse 60  Ht 5\' 11"  (1.803 m)  Wt 239 lb (108.41 kg)  BMI 33.35 kg/m2 \\Well  developed and nourished in no acute distress HENT normal Neck supple with JVP-flat Clear Regular rate and rhythm, no murmurs or gallops Abd-soft with active BS No Clubbing cyanosis edema Skin-warm and dry A & Oriented  Grossly normal sensory and motor function  ECG demonstrates sinus rhythm at 57 Intervals 19/09/40 Otherwise normal this was dated 04/07/13  Assessment and  Plan

## 2013-05-11 NOTE — Patient Instructions (Addendum)
Your physician has requested that you have a cardiac MRI. Cardiac MRI uses a computer to create images of your heart as its beating, producing both still and moving pictures of your heart and major blood vessels. For further information please visit [email or web address]. Please follow the instruction sheet given to you today for more information.  Your physician wants you to follow-up in: 6 months with Dr. Graciela Husbands. You will receive a reminder letter in the mail two months in advance. If you don't receive a letter, please call our office to schedule the follow-up appointment.  Your physician recommends that you continue on your current medications as directed. Please refer to the Current Medication list given to you today.

## 2013-05-11 NOTE — Assessment & Plan Note (Signed)
No symptomatic recurrent atrial fibrillation. On Rivaroxaban

## 2013-05-11 NOTE — Assessment & Plan Note (Signed)
The patient has a nonischemic cardiomyopathy variable ejection fraction. Holter monitor also demonstrated nonsustained ventricular tachycardia and some clarification of ejection fraction and underlying structural heart disease becomes increasingly important. We'll undertake cardiac MRI

## 2013-05-11 NOTE — Assessment & Plan Note (Addendum)
Noted on the monitor. As above  Symptomatic PVCs are much improved with decreased caffeine

## 2013-05-12 ENCOUNTER — Ambulatory Visit: Payer: 59 | Admitting: Internal Medicine

## 2013-05-20 ENCOUNTER — Encounter: Payer: Self-pay | Admitting: *Deleted

## 2013-05-21 ENCOUNTER — Ambulatory Visit: Payer: 59 | Admitting: Internal Medicine

## 2013-05-22 ENCOUNTER — Ambulatory Visit: Payer: 59 | Admitting: Internal Medicine

## 2013-05-26 ENCOUNTER — Encounter: Payer: Self-pay | Admitting: Internal Medicine

## 2013-05-26 ENCOUNTER — Ambulatory Visit (INDEPENDENT_AMBULATORY_CARE_PROVIDER_SITE_OTHER): Payer: 59 | Admitting: Internal Medicine

## 2013-05-26 VITALS — BP 128/70 | HR 62 | Temp 97.9°F | Wt 238.0 lb

## 2013-05-26 DIAGNOSIS — Z23 Encounter for immunization: Secondary | ICD-10-CM

## 2013-05-26 DIAGNOSIS — R1031 Right lower quadrant pain: Secondary | ICD-10-CM

## 2013-05-26 NOTE — Addendum Note (Signed)
Addended by: Sueanne Margarita on: 05/26/2013 02:12 PM   Modules accepted: Orders

## 2013-05-26 NOTE — Progress Notes (Signed)
  Subjective:    Patient ID: [name], male    DOB: November 22, 1965, 47 y.o.   MRN: [id number]  HPI Here for follow up Had negative labs and UGI Never got to GI eval Only very rarely gets some right sided pain--mostly better Has been more careful with eating  Bowels are fine Appetite is good  Current Outpatient Prescriptions on File Prior to Visit  Medication Sig Dispense Refill  . atorvastatin (LIPITOR) 20 MG tablet Take 1 tablet (20 mg total) by mouth daily.  90 tablet  3  . benazepril (LOTENSIN) 10 MG tablet Take 1 tablet (10 mg total) by mouth daily.  30 tablet  6  . carvedilol (COREG) 12.5 MG tablet Take 1 tablet (12.5 mg total) by mouth 2 (two) times daily with a meal.  60 tablet  4  . colchicine 0.6 MG tablet Take 0.6 mg by mouth daily as needed.        Marland Kitchen FLUoxetine (PROZAC) 20 MG capsule take 3 capsules by mouth once daily  90 capsule  3  . Multiple Vitamin (MULTIVITAMIN) tablet Take 1 tablet by mouth daily.      . Rivaroxaban (XARELTO) 20 MG TABS Take 1 tablet (20 mg total) by mouth daily.  30 tablet  6   No current facility-administered medications on file prior to visit.    No Known Allergies  Past Medical History  Diagnosis Date  . Cardiomyopathy 08/2009    Mildly decreased EF  . Depression   . Gout   . Hypertension   . Bicuspid aortic valve     Last echo looks trileaflet  . Hyperlipidemia   . GERD (gastroesophageal reflux disease)   . Atrial fibrillation 1989    Past Surgical History  Procedure Laterality Date  . Heat stroke/seizures  1990s  . Cardioversion  1989  . Cardiac catheterization  03/2010    No sig coronary disease, EF ~40%    Family History  Problem Relation Age of Onset  . Heart attack Father     MI  . Diabetes Mother   . Depression Mother   . Arthritis Mother   . Coronary artery disease Paternal Grandfather   . Coronary artery disease Paternal Uncle   . Crohn's disease Brother   . Cancer Neg Hx   . Hypertension Neg Hx      History   Social History  . Marital Status: Married    Spouse Name: N/A    Number of Children: 1  . 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 Smoking status: Never Smoker   . Smokeless tobacco: Never Used  . Alcohol Use: Yes     Comment: occassional  . Drug Use: No  . Sexual Activity: Not on file   Other Topics Concern  . Not on file   Social History Narrative   Married with 1 son   Review of Systems Weight stable Chronic back pain--- the strain he had last time is better    Objective:   Physical Exam  Constitutional: He appears well-developed and well-nourished. No distress.  Abdominal: Soft. Bowel sounds are normal. He exhibits no distension and no mass. There is no tenderness. There is no rebound and no guarding.          Assessment & Plan:

## 2013-05-26 NOTE — Assessment & Plan Note (Signed)
Better now Work up negative for suggestions of Crohn's No testing needed

## 2013-05-28 ENCOUNTER — Encounter: Payer: Self-pay | Admitting: Internal Medicine

## 2013-05-29 ENCOUNTER — Other Ambulatory Visit: Payer: 59

## 2013-06-08 ENCOUNTER — Ambulatory Visit: Payer: 59 | Admitting: Internal Medicine

## 2013-06-25 ENCOUNTER — Ambulatory Visit (HOSPITAL_COMMUNITY): Admission: RE | Admit: 2013-06-25 | Payer: 59 | Source: Ambulatory Visit

## 2013-06-25 ENCOUNTER — Other Ambulatory Visit: Payer: Self-pay

## 2013-06-25 MED ORDER — BENAZEPRIL HCL 10 MG PO TABS: 10.0000 mg | ORAL_TABLET | Freq: Every day | ORAL | Status: DC

## 2013-06-26 ENCOUNTER — Ambulatory Visit (INDEPENDENT_AMBULATORY_CARE_PROVIDER_SITE_OTHER): Payer: BC Managed Care – PPO

## 2013-06-26 ENCOUNTER — Ambulatory Visit: Payer: Self-pay | Admitting: Podiatry

## 2013-06-26 ENCOUNTER — Ambulatory Visit (INDEPENDENT_AMBULATORY_CARE_PROVIDER_SITE_OTHER): Payer: BC Managed Care – PPO | Admitting: Podiatry

## 2013-06-26 ENCOUNTER — Encounter: Payer: Self-pay | Admitting: Podiatry

## 2013-06-26 VITALS — BP 108/58 | HR 63 | Resp 16 | Ht 71.0 in | Wt 230.0 lb

## 2013-06-26 DIAGNOSIS — M109 Gout, unspecified: Secondary | ICD-10-CM

## 2013-06-26 DIAGNOSIS — M779 Enthesopathy, unspecified: Secondary | ICD-10-CM

## 2013-06-26 MED ORDER — METHYLPREDNISOLONE 4 MG PO KIT: PACK | ORAL | Status: DC

## 2013-06-26 MED ORDER — TRIAMCINOLONE ACETONIDE 10 MG/ML IJ SUSP: 5.0000 mg | Freq: Once | INTRAMUSCULAR | Status: DC

## 2013-06-26 MED ORDER — COLCHICINE 0.6 MG PO TABS: 0.6000 mg | ORAL_TABLET | Freq: Every day | ORAL | Status: DC | PRN

## 2013-06-26 NOTE — Progress Notes (Signed)
Subjective:     Patient ID: [name], male   DOB: Jan 10, 1966, 47 y.o.   MRN: [id number]  HPI patient states I have had gout and I think have had intact of my big toe joint twice in the last couple weeks and it has been very painful recently   Review of Systems  All other systems reviewed and are negative.       Objective:   Physical Exam  Nursing note and vitals reviewed. Constitutional: He is oriented to person, place, and time.  Cardiovascular: Intact distal pulses.   Musculoskeletal: Normal range of motion.  Neurological: He is oriented to person, place, and time.  Skin: Skin is warm.   neurovascular status intact no health history changes are noted with pain and redness around the first MPJ right with soreness when I pressed around the joint surface    Assessment:     Gout with inflammatory capsulitis right first MPJ    Plan:     H&P and x-ray reviewed. Careful injection around the joint 3 mg Kenalog 5 of Xylocaine Marcaine mixture and prescribed Medrol Dosepak to use at this time. Patient is to reappoint if symptoms or problems with gout should reoccur with the possibility for chronic treatment of condition

## 2013-06-26 NOTE — Progress Notes (Signed)
  Subjective:    Patient ID: [name], male    DOB: 10/29/1965, 47 y.o.   MRN: [id number]  HPI Comments: N burning, throbbing sharp, red   L rt foot  Great toe  D thursday morning  O all of sudden  C no better  A  T ibuprofen, colchicine      Review of Systems  All other systems reviewed and are negative.       Objective:   Physical Exam        Assessment & Plan:

## 2013-06-26 NOTE — Patient Instructions (Signed)
Information for patients with Gout  Gout defined-Gout occurs when urate crystals accumulate in your joint causing the inflammation and intense pain of gout attack.  Urate crystals can form when you have high levels of uric acid in your blood.  Your body produces uric acid when it breaks down prurines-substances that are found naturally in your body, as well as in certain foods such as organ meats, anchioves, herring, asparagus, and mushrooms.  Normally uric acid dissolves in your blood and passes through your kidneys into your urine.  But sometimes your body either produces too much uric acid or your kidneys excrete too little uric acid.  When this happens, uric acid can build up, forming sharp needle-like urate crystals in a joint or surrounding tissue that cause pain, inflammation and swelling.    Gout is characterized by sudden, severe attacks of pain, redness and tenderness in joints, often the joint at the base of the big toe.  Gout is complex form of arthritis that can affect anyone.  Men are more likely to get gout but women become increasingly more susceptible to gout after menopause.  An acute attack of gout can wake you up in the middle of the night with the sensation that your big toe is on fire.  The affected joint is hot, swollen and so tender that even the weight or the sheet on it may seem intolerable.  If you experience symptoms of an acute gout attack it is important to your doctor as soon as the symptoms start.  Gout that goes untreated can lead to worsening pain and joint damage.  Risk Factors:  You are more likely to develop gout if you have high levels of uric acid in your body.    Factors that increase the uric acid level in your body include:  Lifestyle factors.  Excessive alcohol use-generally more than two drinks a day for men and more than one for women increase the risk of gout.  Medical conditions.  Certain conditions make it more likely that you will develop gout.   These include hypertension, and chronic conditions such as diabetes, high levels of fat and cholesterol in the blood, and narrowing of the arteries.  Certain medications.  The uses of Thiazide diuretics- commonly used to treat hypertension and low dose aspirin can also increase uric acid levels.  Family history of gout.  If other members of your family have had gout, you are more likely to develop the disease.  Age and sex. Gout occurs more often in men than it does in women, primarily because women tend to have lower uric acid levels than men do.  Men are more likely to develop gout earlier usually between the ages of 40-50- whereas women generally develop signs and symptoms after menopause.    Tests and diagnosis:  Tests to help diagnose gout may include:  Blood test.  Your doctor may recommend a blood test to measure the uric acid level in your blood .  Blood tests can be misleading, though.  Some people have high uric acid levels but never experience gout.  And some people have signs and symptoms of gout, but don't have unusual levels of uric acid in their blood.  Joint fluid test.  Your doctor may use a needle to draw fluid from your affected joint.  When examined under the microscope, your joint fluid may reveal urate crystals.  Treatment:  Treatment for gout usually involves medications.  What medications you and your doctor choose will be   based on your current health and other medications you currently take.  Gout medications can be used to treat acute gout attacks and prevent future attacks as well as reduce your risk of complications from gout such as the development of tophi from urate crystal deposits.  Alternative medicine:   Certain foods have been studied for their potential to lower uric acid levels, including:  Coffee.  Studies have found an association between coffee drinking (regular and decaf) and lower uric acid levels.  The evidence is not enough to encourage  non-coffee drinkers to start, but it may give clues to new ways of treating gout in the future.  Vitamin C.  Supplements containing vitamin C may reduce the levels of uric acid in your blood.  However, vitamin as a treatment for gout. Don't assume that if a little vitamin C is good, than lots is better.  Megadoses of vitamin C may increase your bodies uric acid levels.  Cherries.  Cherries have been associated with lower levels of uric acid in studies, but it isn't clear if they have any effect on gout signs and symptoms.  Eating more cherries and other dar-colored fruits, such as blackberries, blueberries, purple grapes and raspberries, may be a safe way to support your gout treatment.    Lifestyle/Diet Recommendations:   Drink 8 to 16 cups ( about 2 to 4 liters) of fluid each day, with at least half being water.  Avoid alcohol  Eat a moderate amount of protein, preferably from healthy sources, such as low-fat or fat-free dairy, tofu, eggs, and nut butters.  Limit you daily intake of meat, fish, and poultry to 4 to 6 ounces.  Avoid high fat meats and desserts.  Decrease you intake of shellfish, beef, lamb, pork, eggs and cheese.  Choose a good source of vitamin C daily such as citrus fruits, strawberries, broccoli,  brussel sprouts, papaya, and cantaloupe.   Choose a good source of vitamin A every other day such as yellow fruits, or dark green/yellow vegetables.  Avoid drastic weigh reduction or fasting.  If weigh loss is desired lose it over a period of several months.  See "dietary considerations.." chart for specific food recommendations.  Dietary Considerations for people with Gout  Food with negligible purine content (0-15 mg of purine nitrogen per 100 grams food)  May use as desired except on calorie variations  Non fat milk Cocoa Cereals (except in list II) Hard candies  Buttermilk Carbonated drinks Vegetables (except in list II) Sherbet  Coffee Fruits Sugar Honey  Tea  Cottage Cheese Gelatin-jell-o Salt  Fruit juice Breads Angel food Cake   Herbs/spices Jams/Jellies Carnation Instant Breakfast    Foods that do not contain excessive purine content, but must be limited due to fat content  Cream Eggs Oil and Salad Dressing  Half and Half Peanut Butter Chocolate  Whole Milk Cakes Potato Chips  Butter Ice Cream Fried Foods  Cheese Nuts Waffles, pancakes   List II: Food with moderate purine content (50-150 mg of purine nitrogen per 100 grams of food)  Limit total amount each day to 5 oz. cooked Lean meat, other than those on list III   Poultry, other than those on list III Fish, other than those on list III   Seafood, other than those on list III  These foods may be used occasionally  Peas Lentils Bran  Spinach Oatmeal Dried Beans and Peas  Asparagus Wheat Germ Mushrooms   Additional information about meat choices  Choose fish   and poultry, particularly without skin, often.  Select lean, well trimmed cuts of meat.  Avoid all fatty meats, bacon , sausage, fried meats, fried fish, or poultry, luncheon meats, cold cuts, hot dogs, meats canned or frozen in gravy, spareribs and frozen and packaged prepared meats.   List III: Foods with HIGH purine content / Foods to AVOID (150-800 mg of purine nitrogen per 100 grams of food)  Anchovies Herring Meat Broths  Liver Mackerel Meat Extracts  Kidney Scallops Meat Drippings  Sardines Wild Game Mincemeat  Sweetbreads Goose Gravy  Heart Tongue Yeast, baker's and brewers   Commercial soups made with any of the foods listed in List II or List III  In addition avoid all alcoholic beveragesGout Gout is an inflammatory arthritis caused by a buildup of uric acid crystals in the joints. Uric acid is a chemical that is normally present in the blood. When the level of uric acid in the blood is too high it can form crystals that deposit in your joints and tissues. This causes joint redness, soreness, and swelling  (inflammation). Repeat attacks are common. Over time, uric acid crystals can form into masses (tophi) near a joint, destroying bone and causing disfigurement. Gout is treatable and often preventable. CAUSES  The disease begins with elevated levels of uric acid in the blood. Uric acid is produced by your body when it breaks down a naturally found substance called purines. Certain foods you eat, such as meats and fish, contain high amounts of purines. Causes of an elevated uric acid level include:  Being passed down from parent to child (heredity).  Diseases that cause increased uric acid production (such as obesity, psoriasis, and certain cancers).  Excessive alcohol use.  Diet, especially diets rich in meat and seafood.  Medicines, including certain cancer-fighting medicines (chemotherapy), water pills (diuretics), and aspirin.  Chronic kidney disease. The kidneys are no longer able to remove uric acid well.  Problems with metabolism. Conditions strongly associated with gout include:  Obesity.  High blood pressure.  High cholesterol.  Diabetes. Not everyone with elevated uric acid levels gets gout. It is not understood why some people get gout and others do not. Surgery, joint injury, and eating too much of certain foods are some of the factors that can lead to gout attacks. SYMPTOMS   An attack of gout comes on quickly. It causes intense pain with redness, swelling, and warmth in a joint.  Fever can occur.  Often, only one joint is involved. Certain joints are more commonly involved:  Base of the big toe.  Knee.  Ankle.  Wrist.  Finger. Without treatment, an attack usually goes away in a few days to weeks. Between attacks, you usually will not have symptoms, which is different from many other forms of arthritis. DIAGNOSIS  Your caregiver will suspect gout based on your symptoms and exam. In some cases, tests may be recommended. The tests may include:  Blood  tests.  Urine tests.  X-rays.  Joint fluid exam. This exam requires a needle to remove fluid from the joint (arthrocentesis). Using a microscope, gout is confirmed when uric acid crystals are seen in the joint fluid. TREATMENT  There are two phases to gout treatment: treating the sudden onset (acute) attack and preventing attacks (prophylaxis).  Treatment of an Acute Attack.  Medicines are used. These include anti-inflammatory medicines or steroid medicines.  An injection of steroid medicine into the affected joint is sometimes necessary.  The painful joint is rested. Movement can worsen  the arthritis.  You may use warm or cold treatments on painful joints, depending which works best for you.  Treatment to Prevent Attacks.  If you suffer from frequent gout attacks, your caregiver may advise preventive medicine. These medicines are started after the acute attack subsides. These medicines either help your kidneys eliminate uric acid from your body or decrease your uric acid production. You may need to stay on these medicines for a very long time.  The early phase of treatment with preventive medicine can be associated with an increase in acute gout attacks. For this reason, during the first few months of treatment, your caregiver may also advise you to take medicines usually used for acute gout treatment. Be sure you understand your caregiver's directions. Your caregiver may make several adjustments to your medicine dose before these medicines are effective.  Discuss dietary treatment with your caregiver or dietitian. Alcohol and drinks high in sugar and fructose and foods such as meat, poultry, and seafood can increase uric acid levels. Your caregiver or dietician can advise you on drinks and foods that should be limited. HOME CARE INSTRUCTIONS   Do not take aspirin to relieve pain. This raises uric acid levels.  Only take over-the-counter or prescription medicines for pain, discomfort,  or fever as directed by your caregiver.  Rest the joint as much as possible. When in bed, keep sheets and blankets off painful areas.  Keep the affected joint raised (elevated).  Apply warm or cold treatments to painful joints. Use of warm or cold treatments depends on which works best for you.  Use crutches if the painful joint is in your leg.  Drink enough fluids to keep your urine clear or pale yellow. This helps your body get rid of uric acid. Limit alcohol, sugary drinks, and fructose drinks.  Follow your dietary instructions. Pay careful attention to the amount of protein you eat. Your daily diet should emphasize fruits, vegetables, whole grains, and fat-free or low-fat milk products. Discuss the use of coffee, vitamin C, and cherries with your caregiver or dietician. These may be helpful in lowering uric acid levels.  Maintain a healthy body weight. SEEK MEDICAL CARE IF:   You develop diarrhea, vomiting, or any side effects from medicines.  You do not feel better in 24 hours, or you are getting worse. SEEK IMMEDIATE MEDICAL CARE IF:   Your joint becomes suddenly more tender, and you have chills or a fever. MAKE SURE YOU:   Understand these instructions.  Will watch your condition.  Will get help right away if you are not doing well or get worse. Document Released: 07/20/2000 Document Revised: 11/17/2012 Document Reviewed: 03/05/2012 [facility] Patient Information 2014 [place], [place].

## 2013-07-01 ENCOUNTER — Encounter: Payer: Self-pay | Admitting: Internal Medicine

## 2013-07-06 ENCOUNTER — Other Ambulatory Visit: Payer: Self-pay | Admitting: *Deleted

## 2013-07-06 MED ORDER — RIVAROXABAN 20 MG PO TABS: 20.0000 mg | ORAL_TABLET | Freq: Every day | ORAL | Status: DC

## 2013-07-06 NOTE — Telephone Encounter (Signed)
Requested Prescriptions   Signed Prescriptions Disp Refills  . Rivaroxaban (XARELTO) 20 MG TABS tablet 30 tablet 6    Sig: Take 1 tablet (20 mg total) by mouth daily.    Authorizing Provider: Duke Salvia    Ordering User: Kendrick Fries

## 2013-07-10 ENCOUNTER — Ambulatory Visit (HOSPITAL_COMMUNITY): Payer: 59

## 2013-07-24 ENCOUNTER — Ambulatory Visit (HOSPITAL_COMMUNITY): Admission: RE | Admit: 2013-07-24 | Payer: BC Managed Care – PPO | Source: Ambulatory Visit

## 2013-09-02 ENCOUNTER — Other Ambulatory Visit: Payer: Self-pay | Admitting: Internal Medicine

## 2013-09-18 ENCOUNTER — Other Ambulatory Visit: Payer: Self-pay

## 2013-09-18 MED ORDER — CARVEDILOL 12.5 MG PO TABS: 12.5000 mg | ORAL_TABLET | Freq: Two times a day (BID) | ORAL | Status: DC

## 2013-10-28 ENCOUNTER — Other Ambulatory Visit: Payer: Self-pay | Admitting: Internal Medicine

## 2013-11-03 ENCOUNTER — Encounter: Payer: 59 | Admitting: Internal Medicine

## 2013-11-03 DIAGNOSIS — Z0289 Encounter for other administrative examinations: Secondary | ICD-10-CM

## 2013-11-06 ENCOUNTER — Encounter: Payer: 59 | Admitting: Internal Medicine

## 2013-12-10 ENCOUNTER — Encounter: Payer: Self-pay | Admitting: Podiatry

## 2013-12-10 ENCOUNTER — Ambulatory Visit (INDEPENDENT_AMBULATORY_CARE_PROVIDER_SITE_OTHER): Payer: BC Managed Care – PPO | Admitting: Podiatry

## 2013-12-10 VITALS — BP 122/60 | HR 53 | Resp 16

## 2013-12-10 DIAGNOSIS — M779 Enthesopathy, unspecified: Secondary | ICD-10-CM

## 2013-12-10 DIAGNOSIS — M109 Gout, unspecified: Secondary | ICD-10-CM

## 2013-12-10 MED ORDER — TRIAMCINOLONE ACETONIDE 10 MG/ML IJ SUSP
10.0000 mg | Freq: Once | INTRAMUSCULAR | Status: AC
  Administered 2013-12-10: 10 mg

## 2013-12-10 NOTE — Progress Notes (Signed)
Subjective:     Patient ID: [name], male   DOB: 1966-02-20, 48 y.o.   MRN: [id number]  HPI patient presents stating that my left first MPJ has become inflamed and it is just started to hurt. Has not had significant issues with gout recently   Review of Systems     Objective:   Physical Exam Neurovascular status intact with inflamed left first MPJ with fluid buildup    Assessment:     Most likely gout with inflammatory capsulitis first MPJ left    Plan:     Injected the joint 3 mg Kenalog 5 mg Xylocaine Marcaine mixture and gave instructions on wider shoes and soaks and a diet that tries to eliminate chance for uric acid accumulation. We may need to consider medicine if symptoms continue to persist

## 2013-12-23 ENCOUNTER — Other Ambulatory Visit: Payer: Self-pay | Admitting: Internal Medicine

## 2014-01-05 NOTE — Telephone Encounter (Signed)
Pt request refill on fluoxetine; spoke with [facility] and pt had to call [facility] for override to get at local pharmacy; rx ready for pick up. Spoke with pt and he will contact pharmacy.

## 2014-01-29 ENCOUNTER — Other Ambulatory Visit: Payer: Self-pay

## 2014-01-29 MED ORDER — RIVAROXABAN 20 MG PO TABS: 20.0000 mg | ORAL_TABLET | Freq: Every day | ORAL | Status: DC

## 2014-01-29 NOTE — Telephone Encounter (Signed)
Refill sent for xarelto  

## 2014-01-31 ENCOUNTER — Other Ambulatory Visit: Payer: Self-pay | Admitting: Internal Medicine

## 2014-02-28 ENCOUNTER — Other Ambulatory Visit: Payer: Self-pay

## 2014-02-28 MED ORDER — CARVEDILOL 12.5 MG PO TABS: 12.5000 mg | ORAL_TABLET | Freq: Two times a day (BID) | ORAL | Status: DC

## 2014-03-09 ENCOUNTER — Other Ambulatory Visit: Payer: Self-pay | Admitting: Internal Medicine

## 2014-04-02 IMAGING — CR DG CHEST 2V
1 series · 2 of 2 positions shown · non-contrast
Comparison: none

REASON FOR EXAM: Chest Pain
COMMENTS:

[Series 1: w chest pa · 0.14mm/px · 2 of 2 slices shown]
[im 1/2]
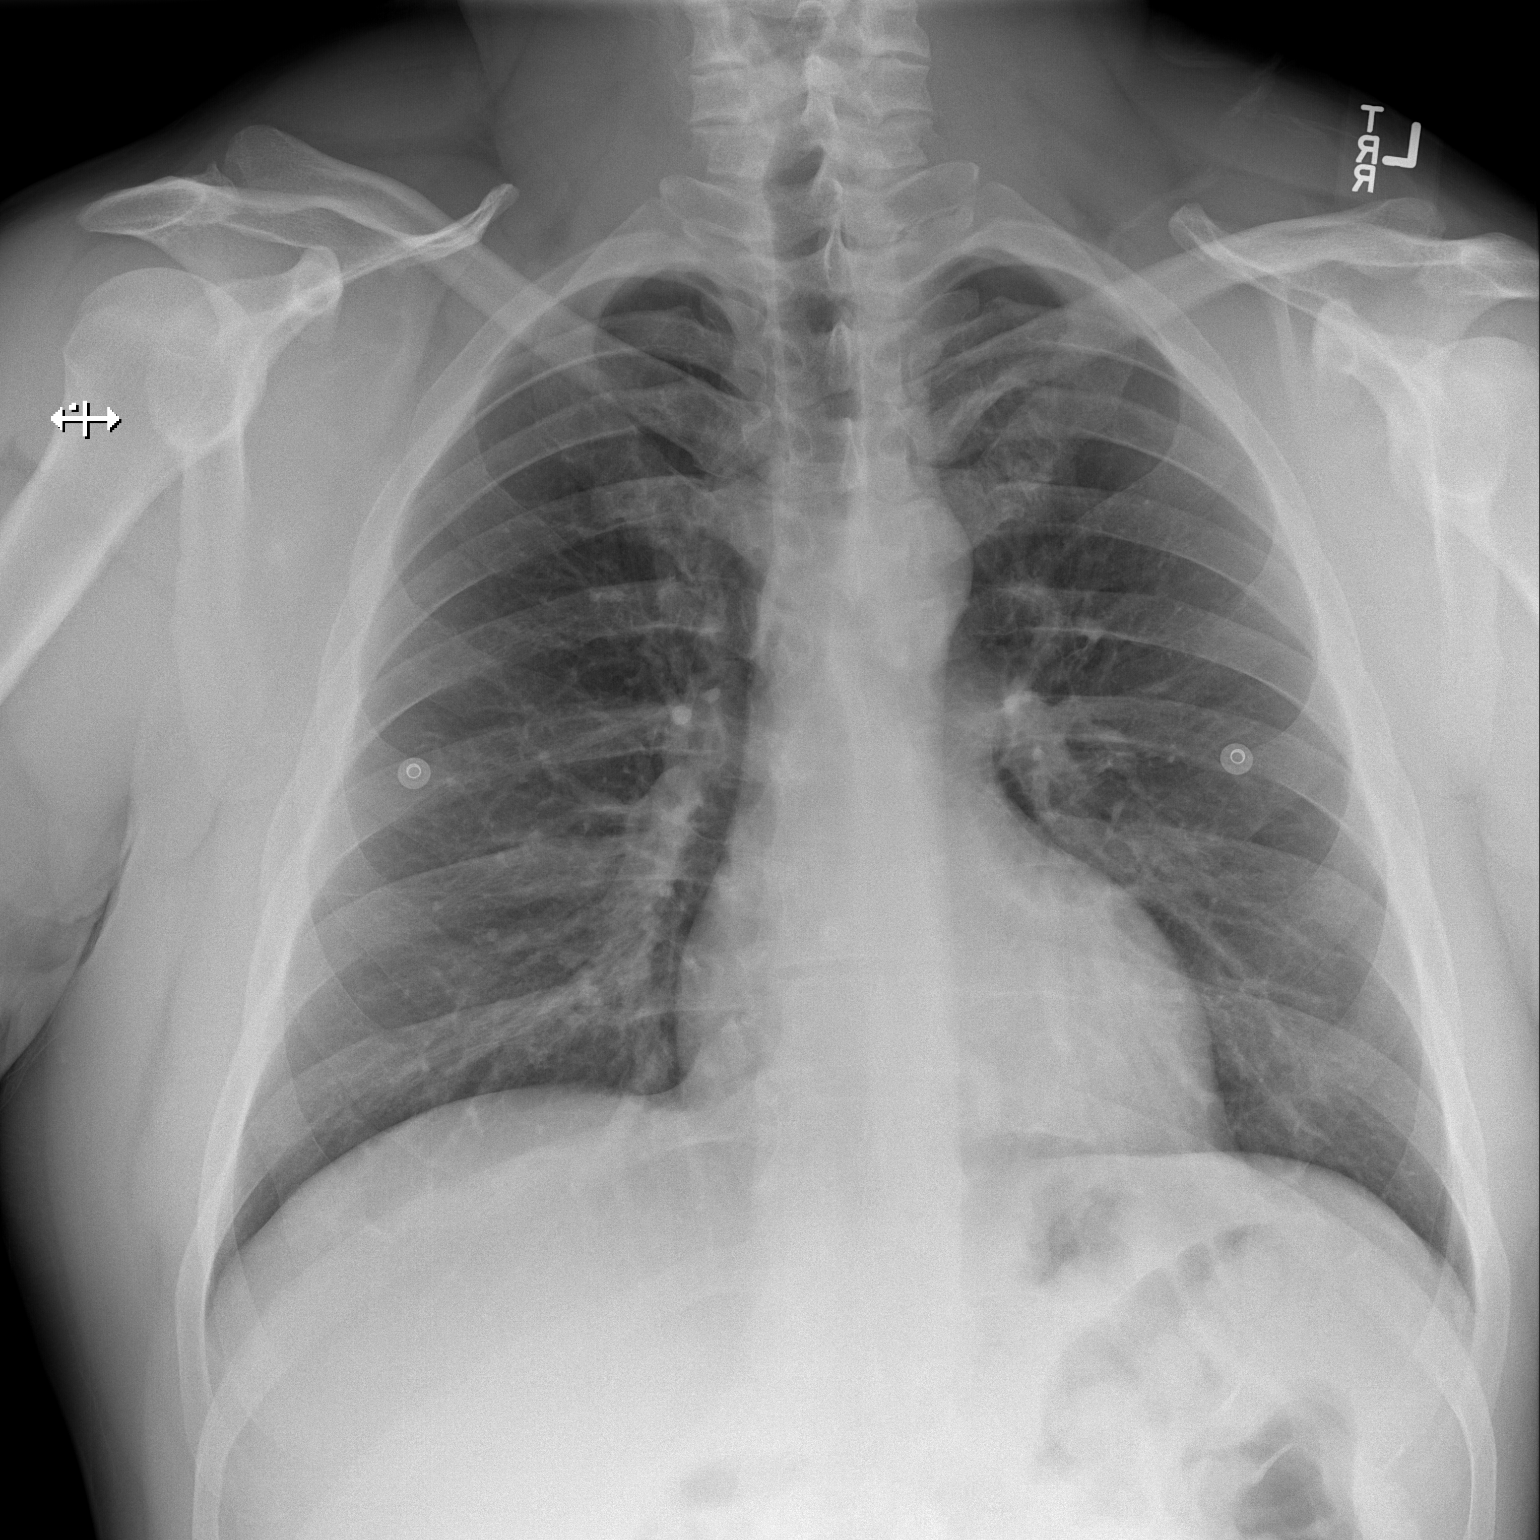
[im 2/2]
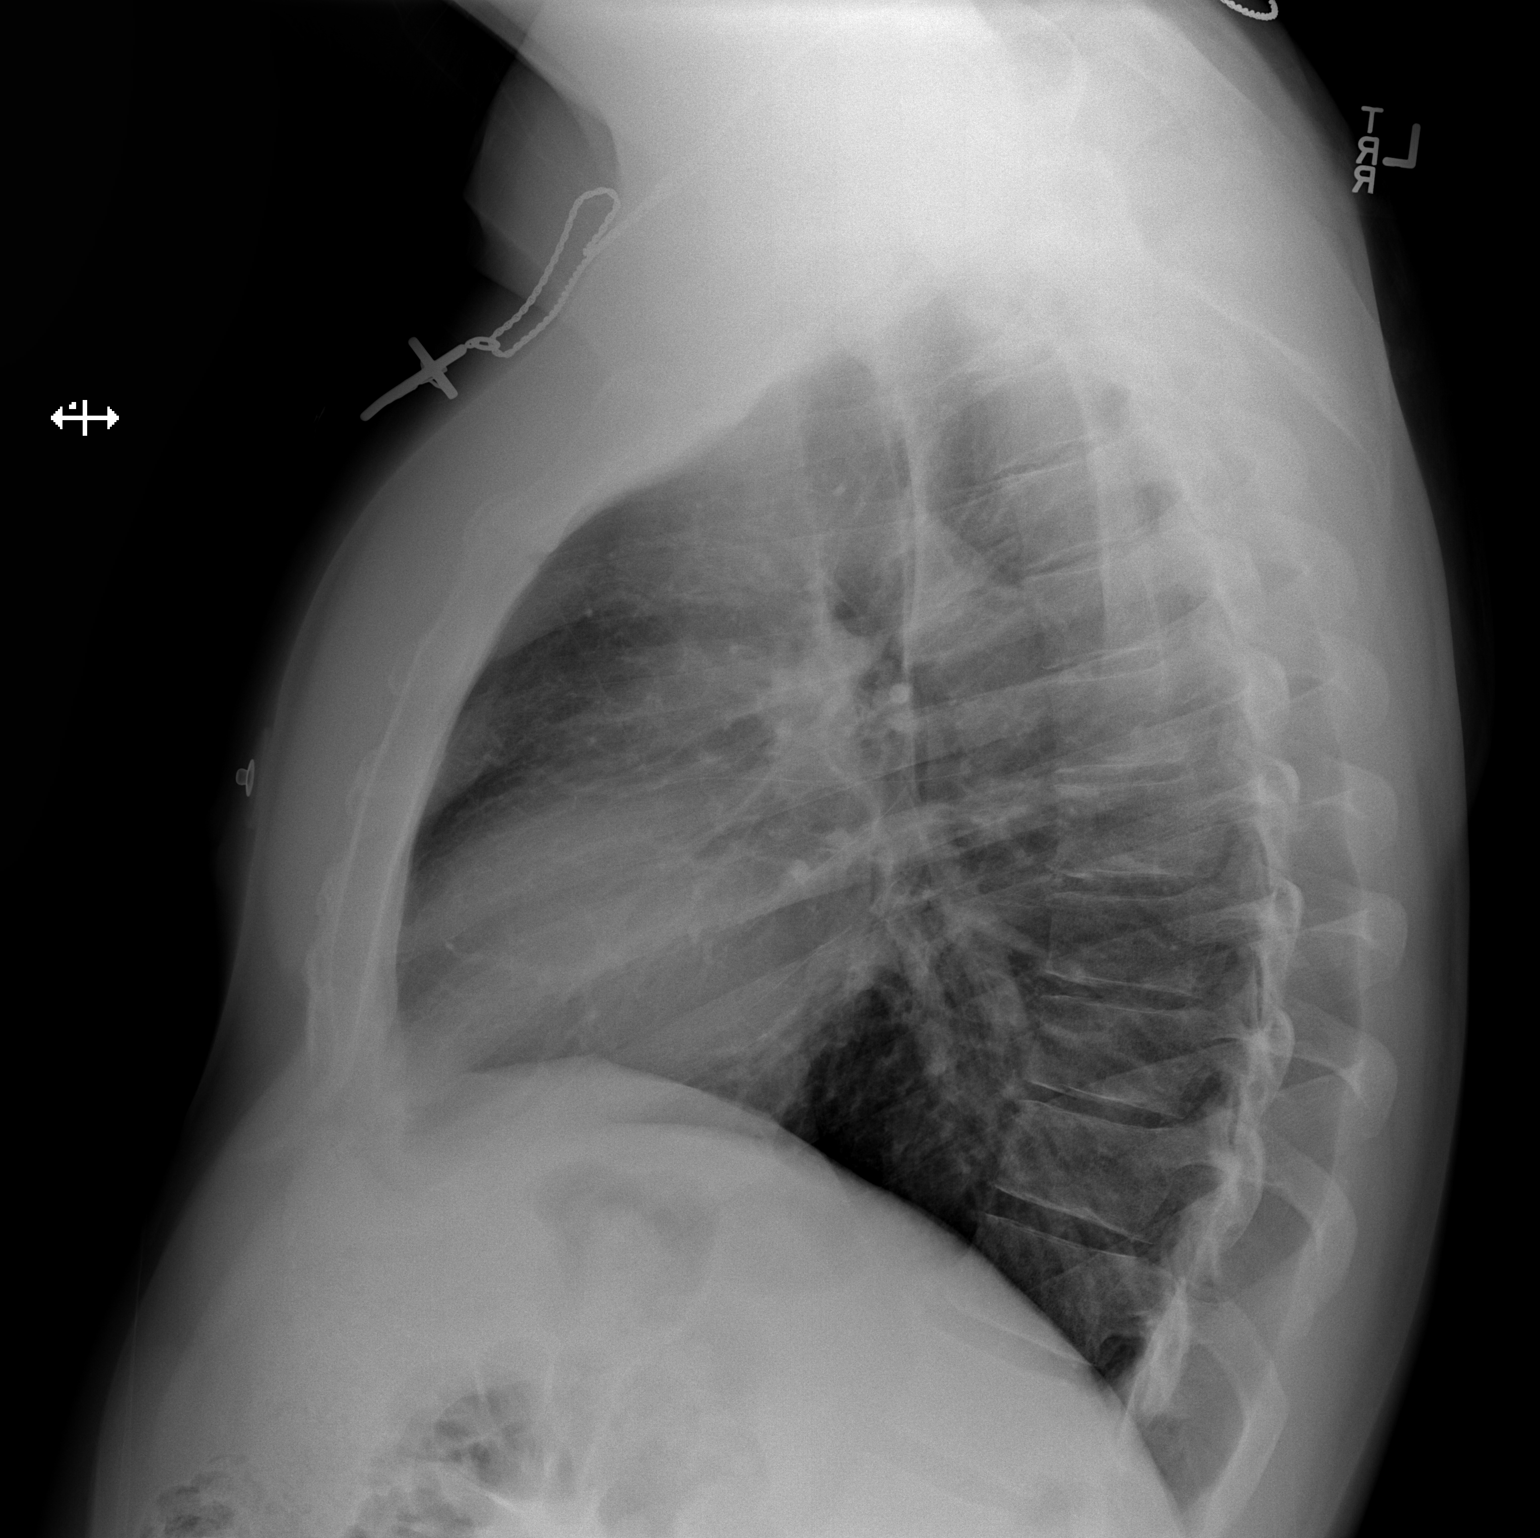

[2 of 2 positions shown; findings below may reference images not displayed]

PROCEDURE:     DXR - DXR CHEST PA (OR AP) AND LATERAL  - February 20, 2013  [DATE]

RESULT:     Comparison is made to the study August 07, 2009.

The lungs are adequately inflated. There is mild prominence of the perihilar
lung markings. The cardiac silhouette is normal in size. The pulmonary
vascularity is not engorged. There is no pleural effusion. The mediastinum
is normal in width.
IMPRESSION: 1. There is no focal pneumonia nor overt evidence of CHF.
2. Mild increase in the perihilar lung markings is nonspecific. This could
reflect a smoking history or mild peribronchial cuffing as could be seen
with acute bronchitis.

[REDACTED]

## 2014-04-05 ENCOUNTER — Telehealth: Payer: Self-pay

## 2014-04-05 NOTE — Telephone Encounter (Signed)
LVM 8/31

## 2014-04-05 NOTE — Telephone Encounter (Signed)
Pt states he had PVCs yesterday about 3 every minute, also felt lightheaded. States he feels better, still experiencing them today, only 1 every minute. Please call.

## 2014-04-05 NOTE — Telephone Encounter (Signed)
Spoke with patient  He stated he was having PVC's every 3 minutes yesterday  He felt dizzy and light headed at that time  He continued to have them earlier in the day, about 1 per minute   Discussed symptoms with Dr. Fletcher Anon  He stated that we could schedule patient to be seen  Patient scheduled for 04/06/14

## 2014-04-06 ENCOUNTER — Encounter: Payer: Self-pay | Admitting: Cardiovascular Disease

## 2014-04-06 ENCOUNTER — Ambulatory Visit (INDEPENDENT_AMBULATORY_CARE_PROVIDER_SITE_OTHER): Payer: BC Managed Care – PPO | Admitting: Cardiovascular Disease

## 2014-04-06 VITALS — BP 120/80 | HR 60 | Ht 70.0 in | Wt 249.0 lb

## 2014-04-06 DIAGNOSIS — I4891 Unspecified atrial fibrillation: Secondary | ICD-10-CM

## 2014-04-06 DIAGNOSIS — I4729 Other ventricular tachycardia: Secondary | ICD-10-CM

## 2014-04-06 DIAGNOSIS — I4949 Other premature depolarization: Secondary | ICD-10-CM

## 2014-04-06 DIAGNOSIS — I493 Ventricular premature depolarization: Secondary | ICD-10-CM

## 2014-04-06 DIAGNOSIS — I4819 Other persistent atrial fibrillation: Secondary | ICD-10-CM

## 2014-04-06 DIAGNOSIS — I428 Other cardiomyopathies: Secondary | ICD-10-CM

## 2014-04-06 DIAGNOSIS — I472 Ventricular tachycardia: Secondary | ICD-10-CM

## 2014-04-06 NOTE — Patient Instructions (Signed)
Your physician has requested that you have a cardiac MRI. Cardiac MRI uses a computer to create images of your heart as its beating, producing both still and moving pictures of your heart and major blood vessels. For further information please visit [email or web address] Please follow the instruction sheet given to you today for more information.  [place] office will call you with the date and time  If you do not hear from them this week please call me at [place] physician recommends that you schedule a follow-up appointment in:  1 month with Dr. Caryl Comes

## 2014-04-06 NOTE — Assessment & Plan Note (Signed)
He is maintaining normal sinus rhythm.

## 2014-04-06 NOTE — Progress Notes (Signed)
HPI  This is a 48 year old male who is here today for followup visit regarding atrial fibrillation and PVCs. He is a patient of Dr. Caryl Comes and was added to my schedule due to increased PVCs.   A Holter monitor-48 hours last year demonstrated frequent PVCs -- about 2000 .  TSH was borderline elevated ; electrolytes were normal He has known history of atrial fibrillation with previous cardioversion.  Ejection fraction at that time was 30%. There is also concern about a bicuspid aortic valve.  Repeat ultrasound 3 months later demonstrated mild improvement in his ejection fraction of 40-45%. there was NO evidence of bicuspid aortic valve.  The patient had some complaints of nonexertional chest discomfort and underwent Myoview scanning with an ejection fraction of 46% and some anterior wall redistribution. Because of this, in August 2011 he underwent catheterization demonstrating normal coronary arteries with an ejection fraction of 40%.  He has been exercising on a regular basis and doing more weightlifting. He has been using supplements for exercise. The supplements were reviewed and I do not see any stimulants or caffeine.  He reports worsening palpitations over the weekend. He usually feels bad when PVCs are frequent.  No Known Allergies   Current Outpatient Prescriptions on File Prior to Visit  Medication Sig Dispense Refill  . atorvastatin (LIPITOR) 20 MG tablet take 1 tablet by mouth once daily  90 tablet  3  . benazepril (LOTENSIN) 10 MG tablet Take 1 tablet (10 mg total) by mouth daily.  30 tablet  6  . carvedilol (COREG) 12.5 MG tablet Take 1 tablet (12.5 mg total) by mouth 2 (two) times daily with a meal.  60 tablet  4  . colchicine 0.6 MG tablet Take 1 tablet (0.6 mg total) by mouth daily as needed.  30 tablet  2  . FLUoxetine (PROZAC) 20 MG capsule take 3 capsules by mouth once daily  90 capsule  0  . methylPREDNISolone (MEDROL DOSEPAK) 4 MG tablet follow package directions  21  tablet  0  . Multiple Vitamin (MULTIVITAMIN) tablet Take 1 tablet by mouth daily.      . rivaroxaban (XARELTO) 20 MG TABS tablet Take 1 tablet (20 mg total) by mouth daily.  30 tablet  6  . vitamin C (ASCORBIC ACID) 500 MG tablet Take 500 mg by mouth daily.       Current Facility-Administered Medications on File Prior to Visit  Medication Dose Route Frequency Provider Last Rate Last Dose  . triamcinolone acetonide (KENALOG) 10 MG/ML injection 5 mg  5 mg Intra-articular Once Wallene Huh, DPM         Past Medical History  Diagnosis Date  . Cardiomyopathy 08/2009    Mildly decreased EF  . Depression   . Gout   . Hypertension   . Bicuspid aortic valve     Last echo looks trileaflet  . Hyperlipidemia   . GERD (gastroesophageal reflux disease)   . Atrial fibrillation 1989     Past Surgical History  Procedure Laterality Date  . Heat stroke/seizures  1990s  . Cardioversion  1989  . Cardiac catheterization  03/2010    No sig coronary disease, EF ~40%     Family History  Problem Relation Age of Onset  . Heart attack Father     MI  . Diabetes Mother   . Depression Mother   . Arthritis Mother   . Coronary artery disease Paternal Grandfather   . Coronary artery disease Paternal Uncle   .  Crohn's disease Brother   . Cancer Neg Hx   . Hypertension Neg Hx      History   Social History  . Marital Status: Married    Spouse Name: N/A    Number of Children: 1  . 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 Smoking status: Never Smoker   . Smokeless tobacco: Never Used  . Alcohol Use: Yes     Comment: occassional  . Drug Use: No  . Sexual Activity: Not on file   Other Topics Concern  . Not on file   Social History Narrative   Married with 1 son      PHYSICAL EXAM   BP 120/80  Pulse 60  Ht 5\' 10"  (1.778 m)  Wt 249 lb (112.946 kg)  BMI 35.73 kg/m2 Constitutional: He  is oriented to person, place, and time. He appears well-developed and well-nourished. No distress.  HENT: No nasal discharge.  Head: Normocephalic and atraumatic.  Eyes: Pupils are equal and round. Right eye exhibits no discharge. Left eye exhibits no discharge.  Neck: Normal range of motion. Neck supple. No JVD present. No thyromegaly present.  Cardiovascular: Normal rate, regular rhythm, normal heart sounds . Exam reveals no gallop and no friction rub. No murmur heard.  Pulmonary/Chest: Effort normal and breath sounds normal. No stridor. No respiratory distress. He has no wheezes. He has no rales. He exhibits no tenderness.  Abdominal: Soft. Bowel sounds are normal. He exhibits no distension. There is no tenderness. There is no rebound and no guarding.  Musculoskeletal: Normal range of motion. He exhibits no edema and no tenderness.  Neurological: He is alert and oriented to person, place, and time. Coordination normal.  Skin: Skin is warm and dry. No rash noted. He is not diaphoretic. No erythema. No pallor.  Psychiatric: He has a normal mood and affect. His behavior is normal. Judgment and thought content normal.       EKG: Sinus  Bradycardia  - occasional ectopic ventricular beat    WITHIN NORMAL LIMITS   ASSESSMENT AND PLAN

## 2014-04-06 NOTE — Assessment & Plan Note (Signed)
Continue treatment with carvedilol and benazepril. I asked him to avoid heavy weight lifting and dry to do more aerobic exercise.

## 2014-04-06 NOTE — Assessment & Plan Note (Signed)
The patient is having more symptoms suggestive of PVCs. I again advised him to try to cut down on caffeine intake. He was supposed to get a cardiac MRI done last year but that was never done. Given his known cardiomyopathy and frequent PVCs, I reordered the cardiac MRI. His ejection fraction remains stable, then that would be reassuring. Continue current treatment for now.

## 2014-04-09 ENCOUNTER — Other Ambulatory Visit: Payer: Self-pay | Admitting: Internal Medicine

## 2014-04-09 ENCOUNTER — Ambulatory Visit: Payer: BC Managed Care – PPO | Admitting: Nurse Practitioner

## 2014-04-13 NOTE — Telephone Encounter (Signed)
rx sent to pharmacy by e-script  

## 2014-04-13 NOTE — Telephone Encounter (Signed)
Ok to fill? Last seen 05/26/13, past appts had been canceled or no show, no future appt seen

## 2014-04-13 NOTE — Telephone Encounter (Signed)
Give him #90 x 0 He needs visit, preferably PE, before I can fill more

## 2014-04-16 ENCOUNTER — Telehealth: Payer: Self-pay

## 2014-04-16 NOTE — Telephone Encounter (Signed)
Pt called, states he has not heard back about scheduling his MRI. Please call and advise

## 2014-04-19 NOTE — Telephone Encounter (Signed)
To [facility] 9/14 Patient aware

## 2014-04-19 NOTE — Telephone Encounter (Signed)
LVM 9/14

## 2014-04-23 ENCOUNTER — Encounter: Payer: Self-pay | Admitting: Internal Medicine

## 2014-04-26 ENCOUNTER — Other Ambulatory Visit: Payer: Self-pay

## 2014-04-26 MED ORDER — BENAZEPRIL HCL 10 MG PO TABS: 10.0000 mg | ORAL_TABLET | Freq: Every day | ORAL | Status: DC

## 2014-05-06 ENCOUNTER — Ambulatory Visit (HOSPITAL_COMMUNITY)
Admission: RE | Admit: 2014-05-06 | Discharge: 2014-05-06 | Disposition: A | Discharge status: Home / Self Care | Payer: BC Managed Care – PPO | Source: Ambulatory Visit | Attending: Internal Medicine | Admitting: Internal Medicine

## 2014-05-06 DIAGNOSIS — I429 Cardiomyopathy, unspecified: Secondary | ICD-10-CM | POA: Diagnosis not present

## 2014-05-06 DIAGNOSIS — I428 Other cardiomyopathies: Secondary | ICD-10-CM

## 2014-05-06 LAB — CREATININE, SERUM: Creatinine, Ser: 0.98 mg/dL (ref 0.50–1.35)

## 2014-05-06 MED ORDER — GADOBENATE DIMEGLUMINE 529 MG/ML IV SOLN
38.0000 mL | Freq: Once | INTRAVENOUS | Status: AC | PRN
  Administered 2014-05-06: 38 mL via INTRAVENOUS

## 2014-05-11 ENCOUNTER — Ambulatory Visit (INDEPENDENT_AMBULATORY_CARE_PROVIDER_SITE_OTHER): Payer: BC Managed Care – PPO | Admitting: Internal Medicine

## 2014-05-11 ENCOUNTER — Encounter: Payer: Self-pay | Admitting: Internal Medicine

## 2014-05-11 VITALS — BP 120/80 | HR 55 | Ht 70.0 in | Wt 250.0 lb

## 2014-05-11 DIAGNOSIS — R079 Chest pain, unspecified: Secondary | ICD-10-CM

## 2014-05-11 NOTE — Progress Notes (Signed)
Patient Care Team: Venia Carbon, MD as PCP - General   HPI  [name] is a 48 y.o. male  Seen in followup for atrial fibrillation that was newly identified 2012. He underwent TEE guided cardioversion with restoration of sinus rhythm. Ejection fraction at that time was 30%. There is also concern about a bicuspid aortic valve.   demonstrated marked improvement in his ejection fraction of 40-45%. there was NO evidence of bicuspid aortic valve .  Repeat US 2013 EF 30-35% MRI>>40-45% 10/15 without WMA or DE  He has had palpitations >>A Holter monitor-48 hours demonstrated frequent PVCs -- about 2000 ie 1%  TSH was borderline elevated ; electrolytes were normal  His PVCs were much better since   coffee from 4->>1 per day.    Past Medical History  Diagnosis Date  . Cardiomyopathy 08/2009    Mildly decreased EF  . Depression   . Gout   . Hypertension   . Bicuspid aortic valve     Last echo looks trileaflet  . Hyperlipidemia   . GERD (gastroesophageal reflux disease)   . Atrial fibrillation 1989    Past Surgical History  Procedure Laterality Date  . Heat stroke/seizures  1990s  . Cardioversion  1989  . Cardiac catheterization  03/2010    No sig coronary disease, EF ~40%    Current Outpatient Prescriptions  Medication Sig Dispense Refill  . atorvastatin (LIPITOR) 20 MG tablet take 1 tablet by mouth once daily  90 tablet  3  . benazepril (LOTENSIN) 10 MG tablet Take 1 tablet (10 mg total) by mouth daily.  30 tablet  0  . carvedilol (COREG) 12.5 MG tablet Take 1 tablet (12.5 mg total) by mouth 2 (two) times daily with a meal.  60 tablet  4  . colchicine 0.6 MG tablet Take 1 tablet (0.6 mg total) by mouth daily as needed.  30 tablet  2  . FLUoxetine (PROZAC) 20 MG capsule take 3 capsules by mouth once daily  90 capsule  0  . methylPREDNISolone (MEDROL DOSEPAK) 4 MG tablet follow package directions  21 tablet  0  . Multiple Vitamin (MULTIVITAMIN) tablet Take 1 tablet by  mouth daily.      . rivaroxaban (XARELTO) 20 MG TABS tablet Take 1 tablet (20 mg total) by mouth daily.  30 tablet  6  . vitamin C (ASCORBIC ACID) 500 MG tablet Take 500 mg by mouth daily.       Current Facility-Administered Medications  Medication Dose Route Frequency Provider Last Rate Last Dose  . triamcinolone acetonide (KENALOG) 10 MG/ML injection 5 mg  5 mg Intra-articular Once Wallene Huh, DPM        No Known Allergies  Review of Systems negative except from HPI and PMH  Physical Exam BP 120/80  Pulse 55  Ht 5\' 10"  (1.778 m)  Wt 250 lb (113.399 kg)  BMI 35.87 kg/m2 \\Well  developed and nourished in no acute distress HENT normal Neck supple with JVP-flat Clear Regular rate and rhythm, no murmurs or gallops Abd-soft with active BS No Clubbing cyanosis edema Skin-warm and dry A & Oriented  Grossly normal sensory and motor function  ECG demonstrates sinus rhythm at 57 Intervals 19/09/40 Otherwise normal this was dated 04/07/13  Assessment and  Plan  PVCs  Cardiomyophaty--nonischemic  Continue BB and ACE  Continue to restrict alcohol and caffeine

## 2014-05-11 NOTE — Patient Instructions (Signed)
Your physician wants you to follow-up in: 1 year with Dr. Caryl Comes. You will receive a reminder letter in the mail two months in advance. If you don't receive a letter, please call our office to schedule the follow-up appointment.  Your physician recommends that you continue on your current medications as directed. Please refer to the Current Medication list given to you today.

## 2014-05-14 ENCOUNTER — Other Ambulatory Visit: Payer: Self-pay | Admitting: Internal Medicine

## 2014-05-14 NOTE — Telephone Encounter (Signed)
Spoke to patient to notify him that this will be the last time Rx is filled without an office visit. Patient verbalized understanding and stated that he will try to schedule an appointment soon.

## 2014-05-14 NOTE — Telephone Encounter (Signed)
#  90 x 0 Let him know I just can't keep refilling unless he comes in  This is the last time

## 2014-05-14 NOTE — Telephone Encounter (Signed)
Received refill request electronically from pharmacy. Last refill 04/13/17 #90, note sent to pharmacy patient needs appointment for further refills. Last office visit 05/26/13. No upcoming appointments scheduled.

## 2014-06-09 ENCOUNTER — Other Ambulatory Visit: Payer: Self-pay | Admitting: Internal Medicine

## 2014-06-09 MED ORDER — BENAZEPRIL HCL 10 MG PO TABS: 10.0000 mg | ORAL_TABLET | Freq: Every day | ORAL | Status: DC

## 2014-06-14 ENCOUNTER — Encounter: Payer: Self-pay | Admitting: Internal Medicine

## 2014-06-14 ENCOUNTER — Ambulatory Visit (INDEPENDENT_AMBULATORY_CARE_PROVIDER_SITE_OTHER): Payer: BC Managed Care – PPO | Admitting: Internal Medicine

## 2014-06-14 VITALS — BP 120/60 | HR 60 | Temp 97.9°F | Wt 250.0 lb

## 2014-06-14 DIAGNOSIS — R1011 Right upper quadrant pain: Secondary | ICD-10-CM

## 2014-06-14 LAB — CBC WITH DIFFERENTIAL/PLATELET
BASOS ABS: 0 10*3/uL (ref 0.0–0.1)
BASOS PCT: 0.5 % (ref 0.0–3.0)
EOS ABS: 0 10*3/uL (ref 0.0–0.7)
Eosinophils Relative: 0 % (ref 0.0–5.0)
HCT: 39.7 % (ref 39.0–52.0)
Hemoglobin: 13.1 g/dL (ref 13.0–17.0)
LYMPHS ABS: 1.9 10*3/uL (ref 0.7–4.0)
LYMPHS PCT: 24.4 % (ref 12.0–46.0)
MCHC: 33 g/dL (ref 30.0–36.0)
MCV: 91.1 fl (ref 78.0–100.0)
MONO ABS: 1 10*3/uL (ref 0.1–1.0)
Monocytes Relative: 12.9 % — ABNORMAL HIGH (ref 3.0–12.0)
Neutro Abs: 4.8 10*3/uL (ref 1.4–7.7)
Neutrophils Relative %: 62.2 % (ref 43.0–77.0)
Platelets: 226 10*3/uL (ref 150.0–400.0)
RBC: 4.36 Mil/uL (ref 4.22–5.81)
RDW: 12.9 % (ref 11.5–15.5)
WBC: 7.7 10*3/uL (ref 4.0–10.5)

## 2014-06-14 LAB — COMPREHENSIVE METABOLIC PANEL
ALK PHOS: 45 U/L (ref 39–117)
ALT: 28 U/L (ref 0–53)
AST: 28 U/L (ref 0–37)
Albumin: 3.7 g/dL (ref 3.5–5.2)
BUN: 21 mg/dL (ref 6–23)
CALCIUM: 9.4 mg/dL (ref 8.4–10.5)
CHLORIDE: 104 meq/L (ref 96–112)
CO2: 24 mEq/L (ref 19–32)
Creatinine, Ser: 1.1 mg/dL (ref 0.4–1.5)
GFR: 76.84 mL/min (ref >60.00)
Glucose, Bld: 85 mg/dL (ref 70–99)
POTASSIUM: 4.4 meq/L (ref 3.5–5.1)
Sodium: 138 mEq/L (ref 135–145)
Total Bilirubin: 1.2 mg/dL (ref 0.2–1.2)
Total Protein: 7.3 g/dL (ref 6.0–8.3)

## 2014-06-14 LAB — LIPID PANEL
CHOL/HDL RATIO: 6
Cholesterol: 200 mg/dL (ref 0–200)
HDL: 34.2 mg/dL — ABNORMAL LOW (ref >39.00)
NONHDL: 165.8
Triglycerides: 367 mg/dL — ABNORMAL HIGH (ref 0.0–149.0)
VLDL: 73.4 mg/dL — ABNORMAL HIGH (ref 0.0–40.0)

## 2014-06-14 LAB — T4, FREE: Free T4: 0.82 ng/dL (ref 0.60–1.60)

## 2014-06-14 LAB — LDL CHOLESTEROL, DIRECT: Direct LDL: 89 mg/dL

## 2014-06-14 NOTE — Progress Notes (Signed)
Subjective:    Patient ID: [name], male    DOB: 05/24/1966, 48 y.o.   MRN: [id number]  HPI Remembers some problems around his liver a couple of years ago Diagnosed fatty liver  Now having increased symptoms again Notes it mostly if eating and sitting up in high backed chair No clear post prandial pain No problems in bed No nausea or vomiting No change in appetite  No longer can run---knee and back pain Can't bike now with weather changes. Does have recumbent bike and treadmill Trying to watch what he eats--- trouble with portion control  Current Outpatient Prescriptions on File Prior to Visit  Medication Sig Dispense Refill  . atorvastatin (LIPITOR) 20 MG tablet take 1 tablet by mouth once daily 90 tablet 3  . benazepril (LOTENSIN) 10 MG tablet Take 1 tablet (10 mg total) by mouth daily. 90 tablet 3  . carvedilol (COREG) 12.5 MG tablet Take 1 tablet (12.5 mg total) by mouth 2 (two) times daily with a meal. 60 tablet 4  . colchicine 0.6 MG tablet Take 1 tablet (0.6 mg total) by mouth daily as needed. 30 tablet 2  . FLUoxetine (PROZAC) 20 MG capsule take 3 capsules by mouth once daily 90 capsule 0  . Multiple Vitamin (MULTIVITAMIN) tablet Take 1 tablet by mouth daily.    . rivaroxaban (XARELTO) 20 MG TABS tablet Take 1 tablet (20 mg total) by mouth daily. 30 tablet 6  . vitamin C (ASCORBIC ACID) 500 MG tablet Take 500 mg by mouth daily.     Current Facility-Administered Medications on File Prior to Visit  Medication Dose Route Frequency Provider Last Rate Last Dose  . triamcinolone acetonide (KENALOG) 10 MG/ML injection 5 mg  5 mg Intra-articular Once Wallene Huh, DPM        No Known Allergies  Past Medical History  Diagnosis Date  . Cardiomyopathy 08/2009    Mildly decreased EF  . Depression   . Gout   . Hypertension   . Bicuspid aortic valve     Last echo looks trileaflet  . Hyperlipidemia   . GERD (gastroesophageal reflux disease)   . Atrial fibrillation  1989    Past Surgical History  Procedure Laterality Date  . Heat stroke/seizures  1990s  . Cardioversion  1989  . Cardiac catheterization  03/2010    No sig coronary disease, EF ~40%    Family History  Problem Relation Age of Onset  . Heart attack Father     MI  . Diabetes Mother   . Depression Mother   . Arthritis Mother   . Coronary artery disease Paternal Grandfather   . Coronary artery disease Paternal Uncle   . Crohn's disease Brother   . Cancer Neg Hx   . Hypertension Neg Hx     History   Social History  . Marital Status: Married    Spouse Name: N/A    Number of Children: 1  . 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 Smoking status: Never Smoker   . Smokeless tobacco: Never Used  . Alcohol Use: Yes     Comment: occassional  . Drug Use: No  . Sexual Activity: Not on file   Other Topics Concern  . Not on file   Social History Narrative   Married with 1 son   Review of Systems Notes a blood taste in mouth  in morning--clears throat of mucus in AM Weight is stable for the most part    Objective:   Physical Exam  Constitutional: He appears well-developed and well-nourished. No distress.  Neck: Normal range of motion. Neck supple. No thyromegaly present.  Cardiovascular: Normal rate, regular rhythm and normal heart sounds.  Exam reveals no gallop.   No murmur heard. Pulmonary/Chest: Effort normal and breath sounds normal. No respiratory distress. He has no wheezes. He has no rales.  Abdominal: Soft. He exhibits no distension. There is no tenderness. There is no rebound and no guarding.  Liver not enlarged  Musculoskeletal: He exhibits no edema.  Lymphadenopathy:    He has no cervical adenopathy.          Assessment & Plan:

## 2014-06-14 NOTE — Progress Notes (Signed)
Pre visit review using our clinic review tool, if applicable. No additional management support is needed unless otherwise documented below in the visit note. 

## 2014-06-14 NOTE — Assessment & Plan Note (Signed)
Doesn't sound like gallbladder so likely from fatty liver Discussed association with CAD Needs to lose weight Will check labs and ultrasound

## 2014-06-14 NOTE — Patient Instructions (Signed)
DASH Eating Plan °DASH stands for "Dietary Approaches to Stop Hypertension." The DASH eating plan is a healthy eating plan that has been shown to reduce high blood pressure (hypertension). Additional health benefits may include reducing the risk of type 2 diabetes mellitus, heart disease, and stroke. The DASH eating plan may also help with weight loss. °WHAT DO I NEED TO KNOW ABOUT THE DASH EATING PLAN? °For the DASH eating plan, you will follow these general guidelines: °· Choose foods with a percent daily value for sodium of less than 5% (as listed on the food label). °· Use salt-free seasonings or herbs instead of table salt or sea salt. °· Check with your health care provider or pharmacist before using salt substitutes. °· Eat lower-sodium products, often labeled as "lower sodium" or "no salt added." °· Eat fresh foods. °· Eat more vegetables, fruits, and low-fat dairy products. °· Choose whole grains. Look for the word "whole" as the first word in the ingredient list. °· Choose fish and skinless chicken or turkey more often than red meat. Limit fish, poultry, and meat to 6 oz (170 g) each day. °· Limit sweets, desserts, sugars, and sugary drinks. °· Choose heart-healthy fats. °· Limit cheese to 1 oz (28 g) per day. °· Eat more home-cooked food and less restaurant, buffet, and fast food. °· Limit fried foods. °· Cook foods using methods other than frying. °· Limit canned vegetables. If you do use them, rinse them well to decrease the sodium. °· When eating at a restaurant, ask that your food be prepared with less salt, or no salt if possible. °WHAT FOODS CAN I EAT? °Seek help from a dietitian for individual calorie needs. °Grains °Whole grain or whole wheat bread. Brown rice. Whole grain or whole wheat pasta. Quinoa, bulgur, and whole grain cereals. Low-sodium cereals. Corn or whole wheat flour tortillas. Whole grain cornbread. Whole grain crackers. Low-sodium crackers. °Vegetables °Fresh or frozen vegetables  (raw, steamed, roasted, or grilled). Low-sodium or reduced-sodium tomato and vegetable juices. Low-sodium or reduced-sodium tomato sauce and paste. Low-sodium or reduced-sodium canned vegetables.  °Fruits °All fresh, canned (in natural juice), or frozen fruits. °Meat and Other Protein Products °Ground beef (85% or leaner), grass-fed beef, or beef trimmed of fat. Skinless chicken or turkey. Ground chicken or turkey. Pork trimmed of fat. All fish and seafood. Eggs. Dried beans, peas, or lentils. Unsalted nuts and seeds. Unsalted canned beans. °Dairy °Low-fat dairy products, such as skim or 1% milk, 2% or reduced-fat cheeses, low-fat ricotta or cottage cheese, or plain low-fat yogurt. Low-sodium or reduced-sodium cheeses. °Fats and Oils °Tub margarines without trans fats. Light or reduced-fat mayonnaise and salad dressings (reduced sodium). Avocado. Safflower, olive, or canola oils. Natural peanut or almond butter. °Other °Unsalted popcorn and pretzels. °The items listed above may not be a complete list of recommended foods or beverages. Contact your dietitian for more options. °WHAT FOODS ARE NOT RECOMMENDED? °Grains °White bread. White pasta. White rice. Refined cornbread. Bagels and croissants. Crackers that contain trans fat. °Vegetables °Creamed or fried vegetables. Vegetables in a cheese sauce. Regular canned vegetables. Regular canned tomato sauce and paste. Regular tomato and vegetable juices. °Fruits °Dried fruits. Canned fruit in light or heavy syrup. Fruit juice. °Meat and Other Protein Products °Fatty cuts of meat. Ribs, chicken wings, bacon, sausage, bologna, salami, chitterlings, fatback, hot dogs, bratwurst, and packaged luncheon meats. Salted nuts and seeds. Canned beans with salt. °Dairy °Whole or 2% milk, cream, half-and-half, and cream cheese. Whole-fat or sweetened yogurt. Full-fat   cheeses or blue cheese. Nondairy creamers and whipped toppings. Processed cheese, cheese spreads, or cheese  curds. °Condiments °Onion and garlic salt, seasoned salt, table salt, and sea salt. Canned and packaged gravies. Worcestershire sauce. Tartar sauce. Barbecue sauce. Teriyaki sauce. Soy sauce, including reduced sodium. Steak sauce. Fish sauce. Oyster sauce. Cocktail sauce. Horseradish. Ketchup and mustard. Meat flavorings and tenderizers. Bouillon cubes. Hot sauce. Tabasco sauce. Marinades. Taco seasonings. Relishes. °Fats and Oils °Butter, stick margarine, lard, shortening, ghee, and bacon fat. Coconut, palm kernel, or palm oils. Regular salad dressings. °Other °Pickles and olives. Salted popcorn and pretzels. °The items listed above may not be a complete list of foods and beverages to avoid. Contact your dietitian for more information. °WHERE CAN I FIND MORE INFORMATION? °National Heart, Lung, and [employer] Institute: www.nhlbi.nih.gov/health/health-topics/topics/dash/ °Document Released: 07/12/2011 Document Revised: 12/07/2013 Document Reviewed: 05/27/2013 °ExitCare® Patient Information ©2015 [employer]. This information is not intended to replace advice given to you by your health care provider. Make sure you discuss any questions you have with your health care provider. ° °

## 2014-06-19 ENCOUNTER — Other Ambulatory Visit: Payer: Self-pay | Admitting: Internal Medicine

## 2014-06-21 ENCOUNTER — Ambulatory Visit: Payer: Self-pay | Admitting: Internal Medicine

## 2014-06-21 ENCOUNTER — Encounter: Payer: Self-pay | Admitting: Internal Medicine

## 2014-06-28 ENCOUNTER — Telehealth: Payer: Self-pay | Admitting: Internal Medicine

## 2014-06-28 ENCOUNTER — Telehealth: Payer: Self-pay

## 2014-06-28 ENCOUNTER — Other Ambulatory Visit: Payer: Self-pay | Admitting: *Deleted

## 2014-06-28 MED ORDER — COLCHICINE 0.6 MG PO TABS: 0.6000 mg | ORAL_TABLET | Freq: Every day | ORAL | Status: DC | PRN

## 2014-06-28 NOTE — Telephone Encounter (Signed)
Pt returned your call, please c/b. Thank you.

## 2014-06-28 NOTE — Telephone Encounter (Signed)
Left message on VM that results where sent to [facility], advised pt to call if any questions

## 2014-06-28 NOTE — Telephone Encounter (Signed)
.  left message to have patient return my call.  

## 2014-06-28 NOTE — Telephone Encounter (Signed)
Pt left v/m requesting [place] results done on 06/21/14. Left v/m requesting pt to cb. Unable to get into [place] result note.

## 2014-06-28 NOTE — Telephone Encounter (Signed)
Please make sure he got the report so he knows everything was normal

## 2014-06-28 NOTE — Telephone Encounter (Signed)
[facility] sent refill req for colchicine 0.6 mg #30 with 2 refills take one by mouth daily if needed. Per dr Paulla Dolly ok to fill.

## 2014-07-23 ENCOUNTER — Other Ambulatory Visit: Payer: Self-pay | Admitting: Internal Medicine

## 2014-07-27 ENCOUNTER — Ambulatory Visit (INDEPENDENT_AMBULATORY_CARE_PROVIDER_SITE_OTHER): Payer: BC Managed Care – PPO | Admitting: Podiatry

## 2014-07-27 ENCOUNTER — Ambulatory Visit (INDEPENDENT_AMBULATORY_CARE_PROVIDER_SITE_OTHER): Payer: BC Managed Care – PPO

## 2014-07-27 ENCOUNTER — Other Ambulatory Visit: Payer: Self-pay

## 2014-07-27 VITALS — BP 134/83 | HR 66 | Resp 16

## 2014-07-27 DIAGNOSIS — M10271 Drug-induced gout, right ankle and foot: Secondary | ICD-10-CM

## 2014-07-27 DIAGNOSIS — M1000 Idiopathic gout, unspecified site: Secondary | ICD-10-CM

## 2014-07-27 LAB — SEDIMENTATION RATE: Sed Rate: 9 mm/hr (ref 0–15)

## 2014-07-27 LAB — CBC WITH DIFFERENTIAL/PLATELET
BASOS ABS: 0 10*3/uL (ref 0.0–0.2)
BASOS: 0 %
EOS ABS: 0.2 10*3/uL (ref 0.0–0.4)
Eos: 2 %
HEMATOCRIT: 37.9 % (ref 37.5–51.0)
Hemoglobin: 13 g/dL (ref 12.6–17.7)
Immature Grans (Abs): 0 10*3/uL (ref 0.0–0.1)
Immature Granulocytes: 0 %
LYMPHS ABS: 1.7 10*3/uL (ref 0.7–3.1)
LYMPHS: 20 %
MCH: 31 pg (ref 26.6–33.0)
MCHC: 34.3 g/dL (ref 31.5–35.7)
MCV: 90 fL (ref 79–97)
MONOCYTES: 10 %
Monocytes Absolute: 0.9 10*3/uL (ref 0.1–0.9)
Neutrophils Absolute: 5.7 10*3/uL (ref 1.4–7.0)
Neutrophils Relative %: 68 %
RBC: 4.2 x10E6/uL (ref 4.14–5.80)
RDW: 13.5 % (ref 12.3–15.4)
WBC: 8.4 10*3/uL (ref 3.4–10.8)

## 2014-07-27 MED ORDER — TRIAMCINOLONE ACETONIDE 10 MG/ML IJ SUSP
10.0000 mg | Freq: Once | INTRAMUSCULAR | Status: AC
  Administered 2014-07-27: 10 mg

## 2014-07-27 MED ORDER — CARVEDILOL 12.5 MG PO TABS: 12.5000 mg | ORAL_TABLET | Freq: Two times a day (BID) | ORAL | Status: DC

## 2014-07-27 NOTE — Patient Instructions (Signed)

## 2014-07-28 ENCOUNTER — Encounter: Payer: Self-pay | Admitting: Podiatry

## 2014-07-28 ENCOUNTER — Telehealth: Payer: Self-pay | Admitting: *Deleted

## 2014-07-28 LAB — URIC ACID: URIC ACID: 8 mg/dL (ref 3.7–8.6)

## 2014-07-28 LAB — C-REACTIVE PROTEIN: CRP: 2.7 mg/L (ref 0.0–4.9)

## 2014-07-28 NOTE — Telephone Encounter (Signed)
CALLED AND LEFT MESSAGE WITH PT LETTING HIM KNOW BLOOD WORK WAS NORMAL. URIC ACID MAY NOT YET BE ELEVATED IN THE BLOOD WHICH IS WHY ITS NORMAL.

## 2014-07-28 NOTE — Progress Notes (Signed)
Patient ID: [name], male   DOB: 1966/04/04, 48 y.o.   MRN: [id number]  Subjective: 48 year old male presents the office today for an unscheduled appointment for complaints of right ankle, right foot, left foot pain/gout. The patient states that he typically gets gout in his big toes although 2 days ago he started having symptoms around the right ankle. He states that he started taking colchicine 2 days ago 1 pill each day. The patient states that the right foot is worse than the left.The patient states that he previously gets an injection into the area which helps alleviate the symptoms. He denies any systemic complaints as fevers, chills, nausea, vomiting. No other complaints at this time and no other acute changes since last appointment. Denies any injury or trauma to the lower extremity's.  Objective: AAO 3, NAD DP/PT pulses palpable bilaterally, CRT less than 3 seconds Protective sensation intact with Simms Weinstein monofilament, vibratory sensation intact, Achilles tendon reflex intact There is tenderness, erythema, edema overlying the lateral aspects of the right foot distal to the ankle. The erythema is not well defined and there is no ascending cellulitis. There is no erythema, pain, swelling to the ankle and ankle joint range of motion is within normal limits. Bilateral 1st MPTJ edematous, with mild increase in warmth, and slight overlying erythema. Mild discomfort with ROM Of the 1st MTPJ bilaterally.  There were no areas of fluctuance or crepitus. No open lesions or pre-ulcerative lesions.  Assessment: 48 year old male bilateral foot pain/erythema, likely gout  Plan: -X-rays were obtained and reviewed with the patient. -Treatment options were discussed the patient including alternatives, risks, complications. -At this time the patient is requesting a steroid injection into the symptomatic area on the right foot. Under sterile conditions a total of 1.5 mL extremity  dexamethasone phosphate, 0.5% Marcaine plain was infiltrated into the area of maximal tenderness along the right foot. Band-Aid was applied. Patient tolerated the injection well without any complications. Post injection care was discussed with the patient. -Continue colchicine as previously prescribed. -Discussed the patient that if symptoms continue over the next couple of days can start a short course of Medrol Dosepak -Will obtain CBC, uric acid, ESR, CRP -Discussed the patient due to the reoccurrence of multiple flares of gout he may benefit from long-term therapy. Recommended the patient to discuss this with his primary care physician and consider rheumatology evaluation. -Follow-up in 2 weeks or sooner should any problems arise. In the meantime, call the office with any questions, concerns, change in symptoms.

## 2014-08-20 ENCOUNTER — Other Ambulatory Visit: Payer: Self-pay

## 2014-08-20 MED ORDER — RIVAROXABAN 20 MG PO TABS: 20.0000 mg | ORAL_TABLET | Freq: Every day | ORAL | Status: DC

## 2014-08-20 NOTE — Telephone Encounter (Signed)
Refill xarelto.

## 2014-08-30 ENCOUNTER — Other Ambulatory Visit: Payer: Self-pay | Admitting: Internal Medicine

## 2014-10-04 ENCOUNTER — Other Ambulatory Visit: Payer: Self-pay | Admitting: Internal Medicine

## 2014-10-04 NOTE — Telephone Encounter (Signed)
Last seen 06/14/2014.  Last filled 08/30/2014.  Please advise.

## 2014-10-04 NOTE — Telephone Encounter (Signed)
Patient left a voicemail requesting more than a 30 day supply because this is inconvenient for him and needs this done today because he is going out of town in the morning.

## 2014-10-05 NOTE — Telephone Encounter (Signed)
Approved: okay to refill for a year 

## 2014-10-20 ENCOUNTER — Other Ambulatory Visit: Payer: Self-pay

## 2014-10-20 MED ORDER — CARVEDILOL 12.5 MG PO TABS: 12.5000 mg | ORAL_TABLET | Freq: Two times a day (BID) | ORAL | Status: DC

## 2014-10-20 NOTE — Telephone Encounter (Signed)
Pt is completely out

## 2014-11-08 ENCOUNTER — Other Ambulatory Visit: Payer: Self-pay | Admitting: Internal Medicine

## 2014-12-10 ENCOUNTER — Telehealth: Payer: Self-pay

## 2014-12-10 NOTE — Telephone Encounter (Signed)
S/w pt - Indicated he is currently in airport awaiting flight back to [place] from [place].  Has been having pain outside of his left elbow, ear pain and left upper chest pain (5/10) for a few days, lasting 5-7 seconds, comes and goes. Per patient, sharp chest pain relieved on its own and is just "annoying". Has been exercising but "nothing unusual", just his daily exercise routine.   Pt denies shortness of breath, nausea, sweating.  Would like to been seen next week for reassurance. Advised pt. To continue monitoring pain and if worsens or unrelieved/sweating/nausea/SOB, seek medical attention immediately. Will ask scheduling to call him with appt.

## 2014-12-10 NOTE — Telephone Encounter (Signed)
Pt states he is in [place] and has left arm pain around his bicep/elbow area, and has had left sided lower pectoral pain, this morning has had left ear pain. Scale of 1-10, a 5, dull pain. Pt c/o of Chest Pain: STAT if CP now or developed within 24 hours  1. Are you having CP right now? Ear pain only, no chest pain  2. Are you experiencing any other symptoms (ex. SOB, nausea, vomiting, sweating)? no  3. How long have you been experiencing CP? 3 days  4. Is your CP continuous or coming and going? Comes and goes, just annoying more than anything  5. Have you taken Nitroglycerin? no?

## 2014-12-14 ENCOUNTER — Encounter: Payer: Self-pay | Admitting: Cardiovascular Disease

## 2014-12-14 ENCOUNTER — Ambulatory Visit (INDEPENDENT_AMBULATORY_CARE_PROVIDER_SITE_OTHER): Payer: BLUE CROSS/BLUE SHIELD | Admitting: Cardiovascular Disease

## 2014-12-14 VITALS — BP 116/68 | HR 68 | Ht 71.0 in | Wt 252.5 lb

## 2014-12-14 DIAGNOSIS — I480 Paroxysmal atrial fibrillation: Secondary | ICD-10-CM

## 2014-12-14 DIAGNOSIS — I10 Essential (primary) hypertension: Secondary | ICD-10-CM | POA: Diagnosis not present

## 2014-12-14 DIAGNOSIS — R0789 Other chest pain: Secondary | ICD-10-CM

## 2014-12-14 DIAGNOSIS — R079 Chest pain, unspecified: Secondary | ICD-10-CM | POA: Diagnosis not present

## 2014-12-14 NOTE — Progress Notes (Signed)
HPI  This is a 49 year old male who is here today for evaluation of chest pain. He has known history of atrial fibrillation, nonischemic cardiomyopathy and PVCs. He is a patient of Dr. Caryl Comes but was seen by me in the past.  He has known history of atrial fibrillation with previous cardioversion.  Ejection fraction at that time was 30%. There is also concern about a bicuspid aortic valve.  Repeat ultrasound 3 months later demonstrated mild improvement in his ejection fraction of 40-45%. there was NO evidence of bicuspid aortic valve.  He had cardiac catheterization done in August 2011 after an abnormal stress test which showed normal coronary arteries with an ejection fraction of 40%.  He was seen last year for increased PVCs which improved after decreasing caffeine intake. He underwent cardiac MRI showed no significant abnormalities other than an ejection fraction of 43%. He went on a business trip recently to [facility]. While he was there, he had left sided chest pain described as aching with some radiation to his left arm. It lasted for a few minutes and it was nonexertional. He does complain of slight worsening of exertional dyspnea.   No Known Allergies   Current Outpatient Prescriptions on File Prior to Visit  Medication Sig Dispense Refill  . atorvastatin (LIPITOR) 20 MG tablet take 1 tablet by mouth once daily 90 tablet 3  . benazepril (LOTENSIN) 10 MG tablet Take 1 tablet (10 mg total) by mouth daily. 90 tablet 3  . carvedilol (COREG) 12.5 MG tablet Take 1 tablet (12.5 mg total) by mouth 2 (two) times daily with a meal. 60 tablet 3  . colchicine 0.6 MG tablet Take 1 tablet (0.6 mg total) by mouth daily as needed. 30 tablet 2  . FLUoxetine (PROZAC) 20 MG capsule take 3 capsules by mouth once daily 270 capsule 3  . Multiple Vitamin (MULTIVITAMIN) tablet Take 1 tablet by mouth daily.    . rivaroxaban (XARELTO) 20 MG TABS tablet Take 1 tablet (20 mg total) by mouth daily. 30 tablet 6    Current Facility-Administered Medications on File Prior to Visit  Medication Dose Route Frequency Provider Last Rate Last Dose  . triamcinolone acetonide (KENALOG) 10 MG/ML injection 5 mg  5 mg Intra-articular Once Wallene Huh, DPM         Past Medical History  Diagnosis Date  . Cardiomyopathy 08/2009    Mildly decreased EF  . Depression   . Gout   . Hypertension   . Bicuspid aortic valve     Last echo looks trileaflet  . Hyperlipidemia   . GERD (gastroesophageal reflux disease)   . Atrial fibrillation 1989     Past Surgical History  Procedure Laterality Date  . Heat stroke/seizures  1990s  . Cardioversion  1989  . Cardiac catheterization  03/2010    No sig coronary disease, EF ~40%     Family History  Problem Relation Age of Onset  . Heart attack Father     MI  . Diabetes Mother   . Depression Mother   . Arthritis Mother   . Coronary artery disease Paternal Grandfather   . Coronary artery disease Paternal Uncle   . Crohn's disease Brother   . Cancer Neg Hx   . Hypertension Neg Hx      History   Social History  . Marital Status: Married    Spouse Name: N/A  . Number of Children: 1  . 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 Smoking status: Never Smoker   . Smokeless tobacco: Never Used  . Alcohol Use: No     Comment: occassional  . Drug Use: No  . Sexual Activity: Not on file   Other Topics Concern  . Not on file   Social History Narrative   Married with 1 son      PHYSICAL EXAM   BP 116/68 mmHg  Pulse 68  Ht 5\' 11"  (1.803 m)  Wt 252 lb 8 oz (114.533 kg)  BMI 35.23 kg/m2 Constitutional: He is oriented to person, place, and time. He appears well-developed and well-nourished. No distress.  HENT: No nasal discharge.  Head: Normocephalic and atraumatic.  Eyes: Pupils are equal and round. Right eye exhibits no discharge. Left eye exhibits no  discharge.  Neck: Normal range of motion. Neck supple. No JVD present. No thyromegaly present.  Cardiovascular: Normal rate, regular rhythm, normal heart sounds . Exam reveals no gallop and no friction rub. No murmur heard.  Pulmonary/Chest: Effort normal and breath sounds normal. No stridor. No respiratory distress. He has no wheezes. He has no rales. He exhibits no tenderness.  Abdominal: Soft. Bowel sounds are normal. He exhibits no distension. There is no tenderness. There is no rebound and no guarding.  Musculoskeletal: Normal range of motion. He exhibits no edema and no tenderness.  Neurological: He is alert and oriented to person, place, and time. Coordination normal.  Skin: Skin is warm and dry. No rash noted. He is not diaphoretic. No erythema. No pallor.  Psychiatric: He has a normal mood and affect. His behavior is normal. Judgment and thought content normal.       EKG: Sinus  Rhythm  WITHIN NORMAL LIMITS   ASSESSMENT AND PLAN

## 2014-12-14 NOTE — Patient Instructions (Signed)
Medication Instructions:  Your physician recommends that you continue on your current medications as directed. Please refer to the Current Medication list given to you today.  Labwork: NONE  Testing/Procedures: Your physician has requested that you have an exercise tolerance test. For further information please visit [email or web address]. Please also follow instruction sheet, as given.   Follow-Up: Keep scheduled follow-up with Dr. Caryl Comes.  Any Other Special Instructions Will Be Listed Below (If Applicable).

## 2014-12-14 NOTE — Assessment & Plan Note (Signed)
Blood pressure is controlled on current medications. 

## 2014-12-14 NOTE — Assessment & Plan Note (Signed)
The etiology is not entirely clear but could be musculoskeletal. It did happen after a flight. However, the symptoms are not suggestive of pulmonary embolism. Also he is on anticoagulation for A. fib and thus the chance of thromboembolism is low. His cardiac exam is unremarkable and baseline ECG is unchanged. I requested evaluation with a treadmill stress test.

## 2014-12-14 NOTE — Assessment & Plan Note (Signed)
He is maintaining in sinus rhythm and currently on long-term anticoagulation.

## 2014-12-21 ENCOUNTER — Ambulatory Visit (INDEPENDENT_AMBULATORY_CARE_PROVIDER_SITE_OTHER): Payer: BLUE CROSS/BLUE SHIELD | Admitting: Primary Care

## 2014-12-21 ENCOUNTER — Encounter: Payer: Self-pay | Admitting: Primary Care

## 2014-12-21 VITALS — BP 126/72 | HR 63 | Temp 97.4°F | Ht 71.0 in | Wt 247.8 lb

## 2014-12-21 DIAGNOSIS — R05 Cough: Secondary | ICD-10-CM | POA: Diagnosis not present

## 2014-12-21 DIAGNOSIS — R059 Cough, unspecified: Secondary | ICD-10-CM

## 2014-12-21 MED ORDER — HYDROCODONE-HOMATROPINE 5-1.5 MG/5ML PO SYRP: 5.0000 mL | ORAL_SOLUTION | Freq: Every evening | ORAL | Status: DC | PRN

## 2014-12-21 MED ORDER — AZITHROMYCIN 250 MG PO TABS: ORAL_TABLET | ORAL | Status: DC

## 2014-12-21 NOTE — Patient Instructions (Addendum)
Start Azithromycin antibiotics today. Take 2 tablets by mouth today, then 1 tablet by mouth daily for four additional days. You may try Delsym or Robitussin over the counter for daytime cough.  You may take the Hycodan at bedtime for cough and rest. I hope you feel better soon! Take care!  Acute Bronchitis Bronchitis is inflammation of the airways that extend from the windpipe into the lungs (bronchi). The inflammation often causes mucus to develop. This leads to a cough, which is the most common symptom of bronchitis.  In acute bronchitis, the condition usually develops suddenly and goes away over time, usually in a couple weeks. Smoking, allergies, and asthma can make bronchitis worse. Repeated episodes of bronchitis may cause further lung problems.  CAUSES Acute bronchitis is most often caused by the same virus that causes a cold. The virus can spread from person to person (contagious) through coughing, sneezing, and touching contaminated objects. SIGNS AND SYMPTOMS   Cough.   Fever.   Coughing up mucus.   Body aches.   Chest congestion.   Chills.   Shortness of breath.   Sore throat.  DIAGNOSIS  Acute bronchitis is usually diagnosed through a physical exam. Your health care provider will also ask you questions about your medical history. Tests, such as chest X-rays, are sometimes done to rule out other conditions.  TREATMENT  Acute bronchitis usually goes away in a couple weeks. Oftentimes, no medical treatment is necessary. Medicines are sometimes given for relief of fever or cough. Antibiotic medicines are usually not needed but may be prescribed in certain situations. In some cases, an inhaler may be recommended to help reduce shortness of breath and control the cough. A cool mist vaporizer may also be used to help thin bronchial secretions and make it easier to clear the chest.  HOME CARE INSTRUCTIONS  Get plenty of rest.   Drink enough fluids to keep your urine  clear or pale yellow (unless you have a medical condition that requires fluid restriction). Increasing fluids may help thin your respiratory secretions (sputum) and reduce chest congestion, and it will prevent dehydration.   Take medicines only as directed by your health care provider.  If you were prescribed an antibiotic medicine, finish it all even if you start to feel better.  Avoid smoking and secondhand smoke. Exposure to cigarette smoke or irritating chemicals will make bronchitis worse. If you are a smoker, consider using nicotine gum or skin patches to help control withdrawal symptoms. Quitting smoking will help your lungs heal faster.   Reduce the chances of another bout of acute bronchitis by washing your hands frequently, avoiding people with cold symptoms, and trying not to touch your hands to your mouth, nose, or eyes.   Keep all follow-up visits as directed by your health care provider.  SEEK MEDICAL CARE IF: Your symptoms do not improve after 1 week of treatment.  SEEK IMMEDIATE MEDICAL CARE IF:  You develop an increased fever or chills.   You have chest pain.   You have severe shortness of breath.  You have bloody sputum.   You develop dehydration.  You faint or repeatedly feel like you are going to pass out.  You develop repeated vomiting.  You develop a severe headache. MAKE SURE YOU:   Understand these instructions.  Will watch your condition.  Will get help right away if you are not doing well or get worse. Document Released: 08/30/2004 Document Revised: 12/07/2013 Document Reviewed: 01/13/2013 [facility] Patient Information 2015 [place], [place].  This information is not intended to replace advice given to you by your health care provider. Make sure you discuss any questions you have with your health care provider.

## 2014-12-21 NOTE — Progress Notes (Signed)
Subjective:    Patient ID: [name], male    DOB: 1966-02-21, 49 y.o.   MRN: [id number]  HPI  [name] is a 49 year old male who presents today with a chief complaint of cough. The cough began last Sunday after a graduation party. He started feeling a sore/scratchy throat which worsened throughout the night along with fatigue. Since Sunday the cough has not improved and he's now developed chest congestion with yellow and green sputum. He reports chills initially, but does not have any at this point. He's been taking Mucinex and sudaphed, ibuprofen, and Neti Pot Rinses without much relief.  He's not tried taking anything over the counter for cough.   Review of Systems  Constitutional: Positive for chills. Negative for fever.  HENT: Positive for congestion, sinus pressure and sore throat. Negative for ear pain and rhinorrhea.   Respiratory: Positive for cough. Negative for shortness of breath.   Cardiovascular: Negative for chest pain.       Past Medical History  Diagnosis Date  . Cardiomyopathy 08/2009    Mildly decreased EF  . Depression   . Gout   . Hypertension   . Bicuspid aortic valve     Last echo looks trileaflet  . Hyperlipidemia   . GERD (gastroesophageal reflux disease)   . Atrial fibrillation 1989    History   Social History  . Marital Status: Married    Spouse Name: N/A  . Number of Children: 1  . 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 Smoking status: Never Smoker   . Smokeless tobacco: Never Used  . Alcohol Use: No     Comment: occassional  . Drug Use: No  . Sexual Activity: Not on file   Other Topics Concern  . Not on file   Social History Narrative   Married with 1 son    Past Surgical History  Procedure Laterality Date  . Heat stroke/seizures  1990s  . Cardioversion  1989  . Cardiac catheterization  03/2010    No sig coronary disease,  EF ~40%    Family History  Problem Relation Age of Onset  . Heart attack Father     MI  . Diabetes Mother   . Depression Mother   . Arthritis Mother   . Coronary artery disease Paternal Grandfather   . Coronary artery disease Paternal Uncle   . Crohn's disease Brother   . Cancer Neg Hx   . Hypertension Neg Hx     No Known Allergies  Current Outpatient Prescriptions on File Prior to Visit  Medication Sig Dispense Refill  . atorvastatin (LIPITOR) 20 MG tablet take 1 tablet by mouth once daily 90 tablet 3  . benazepril (LOTENSIN) 10 MG tablet Take 1 tablet (10 mg total) by mouth daily. 90 tablet 3  . carvedilol (COREG) 12.5 MG tablet Take 1 tablet (12.5 mg total) by mouth 2 (two) times daily with a meal. 60 tablet 3  . colchicine 0.6 MG tablet Take 1 tablet (0.6 mg total) by mouth daily as needed. 30 tablet 2  . FLUoxetine (PROZAC) 20 MG capsule take 3 capsules by mouth once daily 270 capsule 3  . Multiple Vitamin (MULTIVITAMIN) tablet Take 1 tablet by mouth daily.    . rivaroxaban (XARELTO) 20 MG TABS tablet Take 1 tablet (20 mg total) by mouth daily. 30 tablet 6   Current  Facility-Administered Medications on File Prior to Visit  Medication Dose Route Frequency Provider Last Rate Last Dose  . triamcinolone acetonide (KENALOG) 10 MG/ML injection 5 mg  5 mg Intra-articular Once Tamala Fothergill Regal, DPM        BP 126/72 mmHg  Pulse 63  Temp(Src) 97.4 F (36.3 C) (Oral)  Ht 5\' 11"  (1.803 m)  Wt 247 lb 12.8 oz (112.401 kg)  BMI 34.58 kg/m2  SpO2 95%    Objective:   Physical Exam  Constitutional: He is oriented to person, place, and time. He appears ill. No distress.  HENT:  Right Ear: Tympanic membrane and ear canal normal.  Left Ear: Tympanic membrane and ear canal normal.  Nose: Nose normal.  Mouth/Throat: Oropharynx is clear and moist.  Eyes: Conjunctivae are normal. Pupils are equal, round, and reactive to light.  Neck: Neck supple.  Cardiovascular: Normal rate and  regular rhythm.   Pulmonary/Chest: Effort normal and breath sounds normal.  Lymphadenopathy:    He has no cervical adenopathy.  Neurological: He is alert and oriented to person, place, and time.  Skin: Skin is warm and dry.          Assessment & Plan:  Bronchitis:  Cough present for 10 days without improvement. Now with green and yellow chest congestion and inability to rest at night. Lungs clear without suspicion for pneumonia. Due to duration without improvement will treat with antibiotics. RX for Z-Pak for 5 day course. Delsym for daytime cough, Hycodan for cough at night and rest.  Continue Neti pot rinses and ibuprofen as needed. Stop sudaphed. Follow up if no improvement in 3-4 days.

## 2014-12-21 NOTE — Progress Notes (Signed)
Pre visit review using our clinic review tool, if applicable. No additional management support is needed unless otherwise documented below in the visit note. 

## 2014-12-22 ENCOUNTER — Ambulatory Visit: Payer: BLUE CROSS/BLUE SHIELD | Admitting: Internal Medicine

## 2015-01-07 ENCOUNTER — Ambulatory Visit (INDEPENDENT_AMBULATORY_CARE_PROVIDER_SITE_OTHER): Payer: BLUE CROSS/BLUE SHIELD | Admitting: Cardiovascular Disease

## 2015-01-07 DIAGNOSIS — R079 Chest pain, unspecified: Secondary | ICD-10-CM | POA: Diagnosis not present

## 2015-01-07 LAB — EXERCISE TOLERANCE TEST
CHL CUP STRESS STAGE 1 HR: 73 {beats}/min
CHL CUP STRESS STAGE 1 SPEED: 0 mph
CHL CUP STRESS STAGE 2 GRADE: 0 %
CHL CUP STRESS STAGE 3 HR: 96 {beats}/min
CHL CUP STRESS STAGE 3 SPEED: 1.7 mph
CHL CUP STRESS STAGE 4 HR: 104 {beats}/min
CHL CUP STRESS STAGE 4 SBP: 194 mmHg
CHL CUP STRESS STAGE 5 GRADE: 14 %
CHL CUP STRESS STAGE 5 SBP: 185 mmHg
CHL CUP STRESS STAGE 6 GRADE: 16 %
CHL CUP STRESS STAGE 6 SPEED: 4.2 mph
CHL CUP STRESS STAGE 7 GRADE: 18 %
CHL CUP STRESS STAGE 9 HR: 76 {beats}/min
CSEPPHR: 146 {beats}/min
Estimated workload: 15.7 METS
Percent of predicted max HR: 84 %
Stage 1 DBP: 94 mmHg
Stage 1 Grade: 0 %
Stage 1 SBP: 145 mmHg
Stage 2 HR: 75 {beats}/min
Stage 2 Speed: 0 mph
Stage 3 Grade: 10 %
Stage 4 DBP: 94 mmHg
Stage 4 Grade: 12 %
Stage 4 Speed: 2.5 mph
Stage 5 DBP: 101 mmHg
Stage 5 HR: 118 {beats}/min
Stage 5 Speed: 3.4 mph
Stage 6 HR: 134 {beats}/min
Stage 7 HR: 146 {beats}/min
Stage 7 Speed: 5 mph
Stage 8 Grade: 0 %
Stage 8 HR: 116 {beats}/min
Stage 8 Speed: 0 mph
Stage 9 DBP: 112 mmHg
Stage 9 Grade: 0 %
Stage 9 SBP: 168 mmHg
Stage 9 Speed: 0 mph

## 2015-01-07 NOTE — Patient Instructions (Signed)
Medication Instructions:  Please continue your current medications   Labwork: None  Testing/Procedures: None  Follow-Up: Call or return to clinic prn if these symptoms worsen or fail to improve as anticipated.  Any Other Special Instructions Will Be Listed Below  Please monitor your blood pressure and call the office if readings remain elevated Please make sure you take your medications regularly

## 2015-02-08 ENCOUNTER — Other Ambulatory Visit: Payer: Self-pay

## 2015-02-08 MED ORDER — CARVEDILOL 12.5 MG PO TABS: 12.5000 mg | ORAL_TABLET | Freq: Two times a day (BID) | ORAL | Status: DC

## 2015-02-21 ENCOUNTER — Other Ambulatory Visit: Payer: Self-pay

## 2015-02-21 MED ORDER — CARVEDILOL 12.5 MG PO TABS: 12.5000 mg | ORAL_TABLET | Freq: Two times a day (BID) | ORAL | Status: DC

## 2015-02-21 NOTE — Telephone Encounter (Signed)
Pt wife states pt only has 1 pill left, and is out of town, states she has been waiting over a week for this. Please expedite

## 2015-06-22 ENCOUNTER — Ambulatory Visit (INDEPENDENT_AMBULATORY_CARE_PROVIDER_SITE_OTHER): Payer: BLUE CROSS/BLUE SHIELD | Admitting: Internal Medicine

## 2015-06-22 ENCOUNTER — Encounter: Payer: Self-pay | Admitting: Internal Medicine

## 2015-06-22 ENCOUNTER — Other Ambulatory Visit: Payer: Self-pay | Admitting: *Deleted

## 2015-06-22 VITALS — BP 120/80 | HR 73 | Temp 98.2°F | Ht 71.0 in | Wt 253.0 lb

## 2015-06-22 DIAGNOSIS — I429 Cardiomyopathy, unspecified: Secondary | ICD-10-CM

## 2015-06-22 DIAGNOSIS — Z0000 Encounter for general adult medical examination without abnormal findings: Secondary | ICD-10-CM

## 2015-06-22 DIAGNOSIS — F39 Unspecified mood [affective] disorder: Secondary | ICD-10-CM

## 2015-06-22 DIAGNOSIS — Z23 Encounter for immunization: Secondary | ICD-10-CM | POA: Diagnosis not present

## 2015-06-22 DIAGNOSIS — I10 Essential (primary) hypertension: Secondary | ICD-10-CM

## 2015-06-22 DIAGNOSIS — E785 Hyperlipidemia, unspecified: Secondary | ICD-10-CM

## 2015-06-22 DIAGNOSIS — I480 Paroxysmal atrial fibrillation: Secondary | ICD-10-CM

## 2015-06-22 DIAGNOSIS — I428 Other cardiomyopathies: Secondary | ICD-10-CM

## 2015-06-22 LAB — LIPID PANEL
CHOLESTEROL: 214 mg/dL — AB (ref 0–200)
HDL: 38.6 mg/dL — ABNORMAL LOW (ref >39.00)
Total CHOL/HDL Ratio: 6
Triglycerides: 452 mg/dL — ABNORMAL HIGH (ref 0.0–149.0)

## 2015-06-22 LAB — CBC WITH DIFFERENTIAL/PLATELET
BASOS ABS: 0 10*3/uL (ref 0.0–0.1)
BASOS PCT: 0.5 % (ref 0.0–3.0)
EOS ABS: 0 10*3/uL (ref 0.0–0.7)
Eosinophils Relative: 0 % (ref 0.0–5.0)
HCT: 38.3 % — ABNORMAL LOW (ref 39.0–52.0)
Hemoglobin: 13.1 g/dL (ref 13.0–17.0)
Lymphocytes Relative: 22.7 % (ref 12.0–46.0)
Lymphs Abs: 1.4 10*3/uL (ref 0.7–4.0)
MCHC: 34.1 g/dL (ref 30.0–36.0)
MCV: 90.4 fl (ref 78.0–100.0)
MONO ABS: 0.7 10*3/uL (ref 0.1–1.0)
Monocytes Relative: 11.3 % (ref 3.0–12.0)
NEUTROS PCT: 65.5 % (ref 43.0–77.0)
Neutro Abs: 4.1 10*3/uL (ref 1.4–7.7)
Platelets: 254 10*3/uL (ref 150.0–400.0)
RBC: 4.23 Mil/uL (ref 4.22–5.81)
RDW: 12.8 % (ref 11.5–15.5)
WBC: 6.3 10*3/uL (ref 4.0–10.5)

## 2015-06-22 LAB — COMPREHENSIVE METABOLIC PANEL
ALBUMIN: 4.3 g/dL (ref 3.5–5.2)
ALT: 27 U/L (ref 0–53)
AST: 27 U/L (ref 0–37)
Alkaline Phosphatase: 50 U/L (ref 39–117)
BILIRUBIN TOTAL: 0.8 mg/dL (ref 0.2–1.2)
BUN: 21 mg/dL (ref 6–23)
CHLORIDE: 102 meq/L (ref 96–112)
CO2: 29 meq/L (ref 19–32)
Calcium: 9.4 mg/dL (ref 8.4–10.5)
Creatinine, Ser: 1.12 mg/dL (ref 0.40–1.50)
GFR: 74.15 mL/min (ref >60.00)
Glucose, Bld: 96 mg/dL (ref 70–99)
Potassium: 4.3 mEq/L (ref 3.5–5.1)
SODIUM: 138 meq/L (ref 135–145)
Total Protein: 7.3 g/dL (ref 6.0–8.3)

## 2015-06-22 LAB — LDL CHOLESTEROL, DIRECT: Direct LDL: 89 mg/dL

## 2015-06-22 MED ORDER — BENAZEPRIL HCL 10 MG PO TABS: 10.0000 mg | ORAL_TABLET | Freq: Every day | ORAL | Status: DC

## 2015-06-22 NOTE — Patient Instructions (Signed)
DASH Eating Plan  DASH stands for "Dietary Approaches to Stop Hypertension." The DASH eating plan is a healthy eating plan that has been shown to reduce high blood pressure (hypertension). Additional health benefits may include reducing the risk of type 2 diabetes mellitus, heart disease, and stroke. The DASH eating plan may also help with weight loss.  WHAT DO I NEED TO KNOW ABOUT THE DASH EATING PLAN?  For the DASH eating plan, you will follow these general guidelines:  · Choose foods with a percent daily value for sodium of less than 5% (as listed on the food label).  · Use salt-free seasonings or herbs instead of table salt or sea salt.  · Check with your health care provider or pharmacist before using salt substitutes.  · Eat lower-sodium products, often labeled as "lower sodium" or "no salt added."  · Eat fresh foods.  · Eat more vegetables, fruits, and low-fat dairy products.  · Choose whole grains. Look for the word "whole" as the first word in the ingredient list.  · Choose fish and skinless chicken or turkey more often than red meat. Limit fish, poultry, and meat to 6 oz (170 g) each day.  · Limit sweets, desserts, sugars, and sugary drinks.  · Choose heart-healthy fats.  · Limit cheese to 1 oz (28 g) per day.  · Eat more home-cooked food and less restaurant, buffet, and fast food.  · Limit fried foods.  · Cook foods using methods other than frying.  · Limit canned vegetables. If you do use them, rinse them well to decrease the sodium.  · When eating at a restaurant, ask that your food be prepared with less salt, or no salt if possible.  WHAT FOODS CAN I EAT?  Seek help from a dietitian for individual calorie needs.  Grains  Whole grain or whole wheat bread. Brown rice. Whole grain or whole wheat pasta. Quinoa, bulgur, and whole grain cereals. Low-sodium cereals. Corn or whole wheat flour tortillas. Whole grain cornbread. Whole grain crackers. Low-sodium crackers.  Vegetables  Fresh or frozen vegetables  (raw, steamed, roasted, or grilled). Low-sodium or reduced-sodium tomato and vegetable juices. Low-sodium or reduced-sodium tomato sauce and paste. Low-sodium or reduced-sodium canned vegetables.   Fruits  All fresh, canned (in natural juice), or frozen fruits.  Meat and Other Protein Products  Ground beef (85% or leaner), grass-fed beef, or beef trimmed of fat. Skinless chicken or turkey. Ground chicken or turkey. Pork trimmed of fat. All fish and seafood. Eggs. Dried beans, peas, or lentils. Unsalted nuts and seeds. Unsalted canned beans.  Dairy  Low-fat dairy products, such as skim or 1% milk, 2% or reduced-fat cheeses, low-fat ricotta or cottage cheese, or plain low-fat yogurt. Low-sodium or reduced-sodium cheeses.  Fats and Oils  Tub margarines without trans fats. Light or reduced-fat mayonnaise and salad dressings (reduced sodium). Avocado. Safflower, olive, or canola oils. Natural peanut or almond butter.  Other  Unsalted popcorn and pretzels.  The items listed above may not be a complete list of recommended foods or beverages. Contact your dietitian for more options.  WHAT FOODS ARE NOT RECOMMENDED?  Grains  White bread. White pasta. White rice. Refined cornbread. Bagels and croissants. Crackers that contain trans fat.  Vegetables  Creamed or fried vegetables. Vegetables in a cheese sauce. Regular canned vegetables. Regular canned tomato sauce and paste. Regular tomato and vegetable juices.  Fruits  Dried fruits. Canned fruit in light or heavy syrup. Fruit juice.  Meat and Other Protein   Products  Fatty cuts of meat. Ribs, chicken wings, bacon, sausage, bologna, salami, chitterlings, fatback, hot dogs, bratwurst, and packaged luncheon meats. Salted nuts and seeds. Canned beans with salt.  Dairy  Whole or 2% milk, cream, half-and-half, and cream cheese. Whole-fat or sweetened yogurt. Full-fat cheeses or blue cheese. Nondairy creamers and whipped toppings. Processed cheese, cheese spreads, or cheese  curds.  Condiments  Onion and garlic salt, seasoned salt, table salt, and sea salt. Canned and packaged gravies. Worcestershire sauce. Tartar sauce. Barbecue sauce. Teriyaki sauce. Soy sauce, including reduced sodium. Steak sauce. Fish sauce. Oyster sauce. Cocktail sauce. Horseradish. Ketchup and mustard. Meat flavorings and tenderizers. Bouillon cubes. Hot sauce. Tabasco sauce. Marinades. Taco seasonings. Relishes.  Fats and Oils  Butter, stick margarine, lard, shortening, ghee, and bacon fat. Coconut, palm kernel, or palm oils. Regular salad dressings.  Other  Pickles and olives. Salted popcorn and pretzels.  The items listed above may not be a complete list of foods and beverages to avoid. Contact your dietitian for more information.  WHERE CAN I FIND MORE INFORMATION?  National Heart, Lung, and Blood Institute: www.nhlbi.nih.gov/health/health-topics/topics/dash/     This information is not intended to replace advice given to you by your health care provider. Make sure you discuss any questions you have with your health care provider.     Document Released: 07/12/2011 Document Revised: 08/13/2014 Document Reviewed: 05/27/2013  Elsevier Interactive Patient Education ©2016 [employer]

## 2015-06-22 NOTE — Assessment & Plan Note (Signed)
No problems with statin 

## 2015-06-22 NOTE — Assessment & Plan Note (Signed)
BP Readings from Last 3 Encounters:  06/22/15 120/80  12/21/14 126/72  12/14/14 116/68   Good control

## 2015-06-22 NOTE — Progress Notes (Signed)
Subjective:    Patient ID: [name], male    DOB: [birth year], 49 y.o.   MRN: [id number]  HPI Here for physical  No new concerns Ongoing back problems---went to back specialist at [place] x 3---will help for a few months Discussed core work and consider inversion table  No heart issues Follows with Dr Michaele Offer his heart go irregular at times--- no chest pain or SOB with this May feel with exercise--but mostly after alcohol  Still gets RUQ pain--brief and intermittent  Current Outpatient Prescriptions on File Prior to Visit  Medication Sig Dispense Refill  . atorvastatin (LIPITOR) 20 MG tablet take 1 tablet by mouth once daily 90 tablet 3  . carvedilol (COREG) 12.5 MG tablet Take 1 tablet (12.5 mg total) by mouth 2 (two) times daily with a meal. 60 tablet 3  . colchicine 0.6 MG tablet Take 1 tablet (0.6 mg total) by mouth daily as needed. 30 tablet 2  . FLUoxetine (PROZAC) 20 MG capsule take 3 capsules by mouth once daily 270 capsule 3  . Multiple Vitamin (MULTIVITAMIN) tablet Take 1 tablet by mouth daily.    . rivaroxaban (XARELTO) 20 MG TABS tablet Take 1 tablet (20 mg total) by mouth daily. 30 tablet 6   Current Facility-Administered Medications on File Prior to Visit  Medication Dose Route Frequency Provider Last Rate Last Dose  . triamcinolone acetonide (KENALOG) 10 MG/ML injection 5 mg  5 mg Intra-articular Once Wallene Huh, DPM        No Known Allergies  Past Medical History  Diagnosis Date  . Cardiomyopathy 08/2009    Mildly decreased EF  . Depression   . Gout   . Hypertension   . Bicuspid aortic valve     Last echo looks trileaflet  . Hyperlipidemia   . GERD (gastroesophageal reflux disease)   . Atrial fibrillation ([place]) 1989    Past Surgical History  Procedure Laterality Date  . Heat stroke/seizures  1990s  . Cardioversion  1989  . Cardiac catheterization  03/2010    No sig coronary disease, EF ~40%    Family History  Problem  Relation Age of Onset  . Heart attack Father     MI  . Diabetes Mother   . Depression Mother   . Arthritis Mother   . Coronary artery disease Paternal Grandfather   . Coronary artery disease Paternal Uncle   . Crohn's disease Brother   . Cancer Neg Hx   . Hypertension Neg Hx     Social History   Social History  . Marital Status: Married    Spouse Name: N/A  . Number of Children: 1  . 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 Smoking status: Never Smoker   . Smokeless tobacco: Never Used  . Alcohol Use: No     Comment: occassional  . Drug Use: No  . Sexual Activity: Not on file   Other Topics Concern  . Not on file   Social History Narrative   Married with 1 son   Review of Systems  Constitutional: Negative for fatigue.       Wears seat belt Weight up slightly  HENT: Negative for hearing loss and tinnitus.        Keeps up with dentist--due for crown  Eyes: Negative for visual disturbance.       No diplopia or  unilateral vision loss  Respiratory: Negative for cough, chest tightness and shortness of breath.   Cardiovascular: Positive for palpitations. Negative for chest pain.       Occasional mild leg swelling  Gastrointestinal: Negative for nausea, vomiting, abdominal pain, constipation and blood in stool.       Occ heartburn-- uses zantac prn Gas at times--discussed lactose  Endocrine: Negative for polydipsia and polyuria.  Genitourinary: Negative for urgency and difficulty urinating.       Now with some nocturia No sexual problems  Musculoskeletal: Positive for back pain. Negative for joint swelling and arthralgias.  Skin: Positive for rash.       occ itch and white bumps  Allergic/Immunologic: Negative for environmental allergies and immunocompromised state.  Neurological: Positive for dizziness. Negative for syncope, weakness, light-headedness and headaches.        Mild orthostatic dizziness at times  Hematological: Negative for adenopathy. Bruises/bleeds easily.  Psychiatric/Behavioral: Positive for sleep disturbance. The patient is nervous/anxious.        Doesn't sleep as well lately--- uses benedryl with some help Mood okay on the med--issues since [employer]. Will have some brief flashbacks---like seeing something on the side of the road, or checking under bridges       Objective:   Physical Exam  Constitutional: He is oriented to person, place, and time. He appears well-developed and well-nourished. No distress.  HENT:  Head: Normocephalic and atraumatic.  Right Ear: External ear normal.  Left Ear: External ear normal.  Mouth/Throat: Oropharynx is clear and moist. No oropharyngeal exudate.  Eyes: Conjunctivae are normal. Pupils are equal, round, and reactive to light.  Neck: Normal range of motion. Neck supple. No thyromegaly present.  Cardiovascular: Normal rate, regular rhythm, normal heart sounds and intact distal pulses.  Exam reveals no gallop.   No murmur heard. Pulmonary/Chest: Effort normal and breath sounds normal. No respiratory distress. He has no wheezes. He has no rales.  Abdominal: Soft. There is no tenderness.  Musculoskeletal: He exhibits no edema or tenderness.  Lymphadenopathy:    He has no cervical adenopathy.  Neurological: He is alert and oriented to person, place, and time.  Skin: No rash noted. No erythema.  Psychiatric: He has a normal mood and affect. His behavior is normal.          Assessment & Plan:

## 2015-06-22 NOTE — Addendum Note (Signed)
Addended by: Despina Hidden on: 06/22/2015 09:55 AM   Modules accepted: Orders

## 2015-06-22 NOTE — Progress Notes (Signed)
Pre visit review using our clinic review tool, if applicable. No additional management support is needed unless otherwise documented below in the visit note. 

## 2015-06-22 NOTE — Addendum Note (Signed)
Addended by: Daralene Milch C on: 06/22/2015 11:03 AM   Modules accepted: SmartSet

## 2015-06-22 NOTE — Assessment & Plan Note (Signed)
occ sensation but no symptoms with it On xarelto

## 2015-06-22 NOTE — Assessment & Plan Note (Addendum)
Flu vaccine today Needs to work on fitness Cancer screening at 75 Has given blood in past couple of years---so had hep C screening

## 2015-06-22 NOTE — Assessment & Plan Note (Signed)
On appropriate meds and no clinical CHF

## 2015-06-22 NOTE — Assessment & Plan Note (Signed)
Mixed anxiety, dysthymia with slight PTSD symptoms Will continue the SSRI

## 2015-06-28 ENCOUNTER — Other Ambulatory Visit: Payer: Self-pay | Admitting: Cardiovascular Disease

## 2015-08-25 ENCOUNTER — Ambulatory Visit (INDEPENDENT_AMBULATORY_CARE_PROVIDER_SITE_OTHER): Payer: BLUE CROSS/BLUE SHIELD | Admitting: Internal Medicine

## 2015-08-25 ENCOUNTER — Encounter: Payer: Self-pay | Admitting: Internal Medicine

## 2015-08-25 ENCOUNTER — Ambulatory Visit: Payer: BLUE CROSS/BLUE SHIELD | Admitting: Internal Medicine

## 2015-08-25 ENCOUNTER — Other Ambulatory Visit: Payer: Self-pay | Admitting: Internal Medicine

## 2015-08-25 ENCOUNTER — Telehealth: Payer: Self-pay | Admitting: Radiology

## 2015-08-25 VITALS — BP 135/82 | HR 69 | Ht 71.0 in | Wt 253.8 lb

## 2015-08-25 DIAGNOSIS — I10 Essential (primary) hypertension: Secondary | ICD-10-CM

## 2015-08-25 DIAGNOSIS — I480 Paroxysmal atrial fibrillation: Secondary | ICD-10-CM | POA: Diagnosis not present

## 2015-08-25 DIAGNOSIS — Z1211 Encounter for screening for malignant neoplasm of colon: Secondary | ICD-10-CM

## 2015-08-25 NOTE — Telephone Encounter (Signed)
Yes---had noted to Dr Caryl Comes that he was having some blood in stool At his age, if IFOB positive, will set up colonoscopy

## 2015-08-25 NOTE — Progress Notes (Signed)
Patient Care Team: Venia Carbon, MD as PCP - General   HPI  [name] is a 50 y.o. male  Seen in followup for atrial fibrillation that was newly identified 2012. He underwent TEE guided cardioversion with restoration of sinus rhythm. Ejection fraction at that time was 30%. There is also concern about a bicuspid aortic valve.  Repeat demonstrated marked improvement in his ejection fraction of 40-45%. there was NO evidence of bicuspid aortic valve .  Repeat US 2013 EF 30-35% MRI>>40-45% 10/15 without WMA or DE  He has had palpitations >>A Holter monitor-48 hours demonstrated frequent PVCs -- about 2000 ie 1%  TSH was borderline elevated ; electrolytes were normal  There is some increasing problems with exercise tolerance albeit nonspecific. He does not have edema chest pain shortness of breath. He has had scant palpitations.  He's had some propensity towards bleeding with injury i.e. cutting his finger. He is also noted blood in stool. This was painless.  His PVCs were much better since   coffee from 4->>1 per day.    Past Medical History  Diagnosis Date  . Cardiomyopathy 08/2009    Mildly decreased EF  . Depression   . Gout   . Hypertension   . Bicuspid aortic valve     Last echo looks trileaflet  . Hyperlipidemia   . GERD (gastroesophageal reflux disease)   . Atrial fibrillation ([place]) 1989    Past Surgical History  Procedure Laterality Date  . Heat stroke/seizures  1990s  . Cardioversion  1989  . Cardiac catheterization  03/2010    No sig coronary disease, EF ~40%    Current Outpatient Prescriptions  Medication Sig Dispense Refill  . atorvastatin (LIPITOR) 20 MG tablet take 1 tablet by mouth once daily 90 tablet 3  . benazepril (LOTENSIN) 10 MG tablet Take 1 tablet (10 mg total) by mouth daily. 90 tablet 0  . carvedilol (COREG) 12.5 MG tablet take 1 tablet by mouth twice a day WITH A MEAL 60 tablet 6  . colchicine 0.6 MG tablet Take 1 tablet (0.6 mg total)  by mouth daily as needed. 30 tablet 2  . FLUoxetine (PROZAC) 20 MG capsule take 3 capsules by mouth once daily 270 capsule 3  . Multiple Vitamin (MULTIVITAMIN) tablet Take 1 tablet by mouth daily.    . rivaroxaban (XARELTO) 20 MG TABS tablet Take 1 tablet (20 mg total) by mouth daily. 30 tablet 6   Current Facility-Administered Medications  Medication Dose Route Frequency Provider Last Rate Last Dose  . triamcinolone acetonide (KENALOG) 10 MG/ML injection 5 mg  5 mg Intra-articular Once Wallene Huh, DPM        No Known Allergies  Review of Systems negative except from HPI and PMH  Physical Exam BP 135/82 mmHg  Pulse 69  Ht 5\' 11"  (1.803 m)  Wt 253 lb 12 oz (115.1 kg)  BMI 35.41 kg/m2 \\Well  developed and nourished in no acute distress HENT normal Neck supple with JVP-flat Clear Regular rate and rhythm, no murmurs or gallops Abd-soft with active BS No Clubbing cyanosis edema Skin-warm and dry A & Oriented  Grossly normal sensory and motor function  ECG demonstrates sinus rhythm at 57 Intervals 19/09/40 Otherwise normal this was dated 04/07/13  Assessment and  Plan  PVCs  Atrial Fibrillation  Elevated TSH   Elevated blood pressure  Bloody stool  Cardiomyophaty--nonischemic  Continue BB and ACE  His TSH was elevated 2 years ago. Albeit just a little bit.  We will check it again today. We have discussed thyroid physiology.  He has had some bleeding issues with Rivaroxaban including some blood in/on his stool. I will be in touch with Dr. Silvio Pate. We do not have stool Hemoccult cards. We have discussed some of the potentil causes  Continue to restrict alcohol and caffeine  The pressure was elevated today; however, most recent blood pressures were normal. We will not make any medication adjustments at this time.

## 2015-08-25 NOTE — Telephone Encounter (Signed)
Ifob has been ordered and a kit sent to the pt, lmom for pt to call so I could inform him the kit needs to be done asap

## 2015-08-25 NOTE — Patient Instructions (Signed)
Medication Instructions: - no changes  Labwork: - Your physician recommends that you have lab work today: TSH  Procedures/Testing: - none  Follow-Up: - Your physician wants you to follow-up in: 1 year with Dr. Caryl Comes. You will receive a reminder letter in the mail two months in advance. If you don't receive a letter, please call our office to schedule the follow-up appointment.  Any Additional Special Instructions Will Be Listed Below (If Applicable).     If you need a refill on your cardiac medications before your next appointment, please call your pharmacy.

## 2015-08-26 LAB — TSH: TSH: 3.74 u[IU]/mL (ref 0.450–4.500)

## 2015-08-30 ENCOUNTER — Telehealth: Payer: Self-pay | Admitting: Internal Medicine

## 2015-08-30 ENCOUNTER — Ambulatory Visit: Payer: BLUE CROSS/BLUE SHIELD | Admitting: Internal Medicine

## 2015-08-30 NOTE — Telephone Encounter (Signed)
New message ° ° ° ° °Returning a call to the nurse to get lab results °

## 2015-08-30 NOTE — Telephone Encounter (Signed)
The patient is aware of his results. 

## 2015-09-09 ENCOUNTER — Other Ambulatory Visit: Payer: Self-pay | Admitting: Internal Medicine

## 2015-09-27 ENCOUNTER — Other Ambulatory Visit: Payer: Self-pay | Admitting: Internal Medicine

## 2015-10-21 ENCOUNTER — Other Ambulatory Visit: Payer: Self-pay

## 2015-10-21 MED ORDER — FLUOXETINE HCL 20 MG PO CAPS: 60.0000 mg | ORAL_CAPSULE | Freq: Every day | ORAL | Status: DC

## 2015-10-21 NOTE — Telephone Encounter (Signed)
Rx sent electronically.  

## 2015-10-31 ENCOUNTER — Telehealth: Payer: Self-pay | Admitting: Podiatry

## 2015-10-31 ENCOUNTER — Telehealth: Payer: Self-pay | Admitting: *Deleted

## 2015-10-31 NOTE — Telephone Encounter (Signed)
I reviewed pt's clinical and medication records and he has not been seen by our doctors since 2015.  I told pt he would need to be seen prior to refills.  Pt agreed and I transferred to schedulers.

## 2015-10-31 NOTE — Telephone Encounter (Signed)
Patient requesting refill on colchicine

## 2015-10-31 NOTE — Telephone Encounter (Signed)
He should come in for an appointment. I have not seen him since 2015.

## 2015-10-31 NOTE — Telephone Encounter (Signed)
Pt called in requesting a refill on RX ( colchicine )

## 2015-11-01 ENCOUNTER — Telehealth: Payer: Self-pay | Admitting: *Deleted

## 2015-11-01 NOTE — Telephone Encounter (Addendum)
Fax refill request for Colchicine.  Denied and refaxed to [facility].  Informed pt he would need an appt, he states he has an appt tomorrow.

## 2015-11-10 ENCOUNTER — Encounter: Payer: Self-pay | Admitting: Podiatry

## 2015-11-10 ENCOUNTER — Ambulatory Visit (INDEPENDENT_AMBULATORY_CARE_PROVIDER_SITE_OTHER): Payer: BLUE CROSS/BLUE SHIELD | Admitting: Podiatry

## 2015-11-10 VITALS — BP 138/76 | HR 67 | Resp 18

## 2015-11-10 DIAGNOSIS — L989 Disorder of the skin and subcutaneous tissue, unspecified: Secondary | ICD-10-CM

## 2015-11-10 DIAGNOSIS — M1000 Idiopathic gout, unspecified site: Secondary | ICD-10-CM | POA: Diagnosis not present

## 2015-11-10 MED ORDER — COLCHICINE 0.6 MG PO TABS: 0.6000 mg | ORAL_TABLET | Freq: Every day | ORAL | Status: DC | PRN

## 2015-11-10 NOTE — Patient Instructions (Signed)

## 2015-11-10 NOTE — Progress Notes (Signed)
Patient ID: [name], male   DOB: 1966/01/01, 50 y.o.   MRN: [id number]  Subjective: 50 year old male presents the also asking for refill of colchicine. He states that last week he had a flareup of gout. He's been taking cherry juice extract as well as drinking cherry juice which seem to help. He states that the swelling and the redness has resolved although he still gets occasional discomfort. He states this feels the same as it did previously with also gout attacks. He has a states that he's had multiple small lesions to his right foot and leg since 2015 after returning from [place]. He states the itching intermittently. He has bruising seen a dermatologist who states they were 8 spots however he is concerned about them and would like a second opinion. Denies any systemic complaints such as fevers, chills, nausea, vomiting. No acute changes since last appointment, and no other complaints at this time.   Objective: AAO x3, NAD DP/PT pulses palpable bilaterally, CRT less than 3 seconds Protective sensation intact with Simms Weinstein monofilament At this time there is no significant edema, erythema to bilateral feet. There is mild pain the first MTPJ however no restriction or crepitation first MTPJ range of motion. On the right foot and leg are small flesh-colored raised lesions. There is no pain to the area and there is no redness or swelling. There are multiple lesions present to the leg and foot. No areas of pinpoint bony tenderness or pain with vibratory sensation. No edema, erythema, increase in warmth to bilateral lower extremities.  No open lesions or pre-ulcerative lesions.  No pain with calf compression, swelling, warmth, erythema  Assessment: 50 year old male presents for likely gout reoccurrence and second opinion on skin lesions right foot/leg.  Plan: -All treatment options discussed with the patient including all alternatives, risks, complications.  -For gout he currently has  that he'll improve symptoms. However prescribed colchicine. I discussed with him risks and side effects this medicine as well as potential interactions with other medications being understands this. If he gets any flares in the future he should call the office. -Regards the skin lesions. Because second opinion on the lesions. They do appear to be benign lesions however he would have it checked. Discussed for other diagnoses biopsy wishes to proceed with this. Under sterile conditions a total of 0.5 mL lidocaine with epinephrine was infiltrated subdermally underneath one of lesions on the right foot. Once anesthetized the skin was prepped in a sterile fashion. A 3 mm punch biopsy was utilized to remove one of the lesions was sent for pathology. The area was cleaned. Antibiotic was applied followed by dressing. Post procedure instructions were discussed. Monitor for any clinical signs or symptoms of infection and directed to call the office immediately should any occur or go to the ER. -Follow-up in 1 week or sooner if any problems arise. In the meantime, encouraged to call the office with any questions, concerns, change in symptoms.   Celesta Gentile, DPM

## 2015-11-14 NOTE — Addendum Note (Signed)
Addended by: Cranford Mon R on: 11/14/2015 07:56 AM   Modules accepted: Orders

## 2015-11-26 ENCOUNTER — Telehealth: Payer: Self-pay | Admitting: *Deleted

## 2015-11-26 NOTE — Telephone Encounter (Signed)
Called patient and left a message stating that the biopsy came back and revealed that it was a non-spectic dermatitis and not fungus and it was not likely contagious and to call the [facility] office if any concerns or questions at [phone]. Lattie Haw

## 2015-11-28 ENCOUNTER — Telehealth: Payer: Self-pay | Admitting: *Deleted

## 2015-11-28 NOTE — Telephone Encounter (Addendum)
Pt states returning Lisa's call concerning biopsy result.  Left message to call again for results.

## 2015-12-01 ENCOUNTER — Ambulatory Visit (INDEPENDENT_AMBULATORY_CARE_PROVIDER_SITE_OTHER): Payer: BLUE CROSS/BLUE SHIELD | Admitting: Podiatry

## 2015-12-01 ENCOUNTER — Encounter: Payer: Self-pay | Admitting: Podiatry

## 2015-12-01 ENCOUNTER — Other Ambulatory Visit: Payer: Self-pay

## 2015-12-01 VITALS — BP 145/87 | HR 69 | Resp 18

## 2015-12-01 DIAGNOSIS — M79672 Pain in left foot: Secondary | ICD-10-CM

## 2015-12-01 DIAGNOSIS — G5751 Tarsal tunnel syndrome, right lower limb: Secondary | ICD-10-CM | POA: Diagnosis not present

## 2015-12-01 DIAGNOSIS — M25571 Pain in right ankle and joints of right foot: Secondary | ICD-10-CM

## 2015-12-01 DIAGNOSIS — L309 Dermatitis, unspecified: Secondary | ICD-10-CM

## 2015-12-01 DIAGNOSIS — M779 Enthesopathy, unspecified: Secondary | ICD-10-CM

## 2015-12-01 MED ORDER — DESOXIMETASONE 0.25 % EX CREA: 1.0000 "application " | TOPICAL_CREAM | Freq: Two times a day (BID) | CUTANEOUS | Status: DC

## 2015-12-01 MED ORDER — ATORVASTATIN CALCIUM 20 MG PO TABS: 20.0000 mg | ORAL_TABLET | Freq: Every day | ORAL | Status: DC

## 2015-12-01 NOTE — Telephone Encounter (Signed)
Rx sent electronically.  

## 2015-12-02 ENCOUNTER — Encounter: Payer: Self-pay | Admitting: Podiatry

## 2015-12-02 NOTE — Progress Notes (Signed)
Patient ID: [name], male   DOB: 02-21-1966, 50 y.o.   MRN: [id number]  Subjective: 50 year old male presents the optic follow-up evaluation of left ankle pain pieces her ankle still hurts. Its been taking cold feet as well as cherry juice and no relief. He states that the pain to his ankle started after he was running. Denies a twisting of the ankle or any injury or trauma. Denies any swelling or redness or ankle this time. He also presents for follow-up after biopsy right foot. No redness or drainage and healing well. Denies any systemic complaints such as fevers, chills, nausea, vomiting. No acute changes since last appointment, and no other complaints at this time.   Objective: AAO x3, NAD DP/PT pulses palpable bilaterally, CRT less than 3 seconds Protective sensation intact with Simms Weinstein monofilament There is mild to palpation of the left subtalar joint on the lateral aspect of the sinus tarsi. There is no pain or crepitation with subtalar joint range of motion. Is no pain with ankle joint range of motion. Is no edema, erythema, increase in warmth. Biopsy site for right dorsal foot is well-healed. No areas of pinpoint bony tenderness or pain with vibratory sensation. MMT 5/5, ROM WNL. No edema, erythema, increase in warmth to bilateral lower extremities.  No open lesions or pre-ulcerative lesions.  No pain with calf compression, swelling, warmth, erythema  Assessment: Left subtalar joint capsulitis dermatitis  Plan: -All treatment options discussed with the patient including all alternatives, risks, complications.  -Discussed steroid injection to the subtalar joint left side. Understanding risks elected to pursue the injection. Under sterile conditions a mixture of dexamethasone phosphate and local anesthetic was infiltrated without complications. Post injection care discussed. Ankle brace provided. -Topicort ordered for skin lesion -Patient encouraged to call the office  with any questions, concerns, change in symptoms.   Celesta Gentile, DPM

## 2015-12-12 ENCOUNTER — Telehealth: Payer: Self-pay | Admitting: Internal Medicine

## 2015-12-12 DIAGNOSIS — M545 Low back pain: Secondary | ICD-10-CM

## 2015-12-12 NOTE — Telephone Encounter (Signed)
Referral placed.

## 2015-12-12 NOTE — Telephone Encounter (Signed)
Pt needs a referral to Dr Pryor Montes at [facility] [phone]  Fax [phone]  Needed for lower back pain  cb number for pt [phone],  Thanks

## 2015-12-19 NOTE — Telephone Encounter (Signed)
Pt called and would like to get an updated MRI prior to his appointment with Dr.Towers at [facility].  Please schedule at [facility].  Best number to call pt is

## 2015-12-19 NOTE — Telephone Encounter (Signed)
Please let him know that I am not comfortable just ordering MRI when I have not evaluated him for this problem. [facility] doctor should be able to see at least reports from [facility]--it would probably be best if he has copies of the past MRI to bring with him for his appt

## 2015-12-19 NOTE — Telephone Encounter (Signed)
Spoke to patient. Made him an appointment for 12-23-15

## 2015-12-19 NOTE — Telephone Encounter (Signed)
Pt

## 2015-12-23 ENCOUNTER — Encounter: Payer: Self-pay | Admitting: Internal Medicine

## 2015-12-23 ENCOUNTER — Ambulatory Visit (INDEPENDENT_AMBULATORY_CARE_PROVIDER_SITE_OTHER): Payer: BLUE CROSS/BLUE SHIELD | Admitting: Internal Medicine

## 2015-12-23 VITALS — BP 96/70 | HR 73 | Temp 98.1°F | Wt 248.0 lb

## 2015-12-23 DIAGNOSIS — G8929 Other chronic pain: Secondary | ICD-10-CM | POA: Diagnosis not present

## 2015-12-23 DIAGNOSIS — M5441 Lumbago with sciatica, right side: Secondary | ICD-10-CM

## 2015-12-23 NOTE — Progress Notes (Signed)
Pre visit review using our clinic review tool, if applicable. No additional management support is needed unless otherwise documented below in the visit note. 
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
## 2015-12-23 NOTE — Assessment & Plan Note (Signed)
Considering intervention at [facility] [place] or [facility] Discussed uncertainties of surgical treatment of back pain Will recheck MRI in anticipation of referral Consider accupuncture

## 2015-12-23 NOTE — Patient Instructions (Signed)
Consider acupuncture

## 2015-12-27 ENCOUNTER — Ambulatory Visit: Payer: BLUE CROSS/BLUE SHIELD | Admitting: Podiatry

## 2016-01-04 ENCOUNTER — Other Ambulatory Visit: Payer: Self-pay | Admitting: Internal Medicine

## 2016-01-13 ENCOUNTER — Ambulatory Visit
Admission: RE | Admit: 2016-01-13 | Discharge: 2016-01-13 | Disposition: A | Discharge status: Home / Self Care | Payer: BLUE CROSS/BLUE SHIELD | Source: Ambulatory Visit | Attending: Internal Medicine | Admitting: Internal Medicine

## 2016-01-13 DIAGNOSIS — M5441 Lumbago with sciatica, right side: Secondary | ICD-10-CM | POA: Insufficient documentation

## 2016-01-13 DIAGNOSIS — R938 Abnormal findings on diagnostic imaging of other specified body structures: Secondary | ICD-10-CM | POA: Diagnosis not present

## 2016-01-13 DIAGNOSIS — M4806 Spinal stenosis, lumbar region: Secondary | ICD-10-CM | POA: Insufficient documentation

## 2016-01-13 DIAGNOSIS — G8929 Other chronic pain: Secondary | ICD-10-CM | POA: Diagnosis not present

## 2016-01-30 ENCOUNTER — Other Ambulatory Visit: Payer: Self-pay | Admitting: Cardiovascular Disease

## 2016-04-12 ENCOUNTER — Telehealth: Payer: Self-pay | Admitting: Internal Medicine

## 2016-04-12 NOTE — Telephone Encounter (Signed)
I spoke with the patient. I have advised him not to take ibuprofen/ NSAIDS.

## 2016-04-12 NOTE — Telephone Encounter (Signed)
Pt calling asking if he allowed to take Ibuprofen

## 2016-04-13 ENCOUNTER — Telehealth: Payer: Self-pay | Admitting: Internal Medicine

## 2016-04-13 NOTE — Telephone Encounter (Signed)
Printed patient entire cardiac record per request.  He will pick them up on Monday .  Left on "to pick up"  tray at back counter .

## 2016-04-24 ENCOUNTER — Ambulatory Visit: Payer: BLUE CROSS/BLUE SHIELD | Admitting: Podiatry

## 2016-04-25 ENCOUNTER — Ambulatory Visit: Payer: BLUE CROSS/BLUE SHIELD | Admitting: Podiatry

## 2016-04-25 ENCOUNTER — Telehealth: Payer: Self-pay | Admitting: Podiatry

## 2016-04-25 NOTE — Telephone Encounter (Signed)
Patient called wanting a refill on his Rx Colchicine for his Gout.

## 2016-04-25 NOTE — Telephone Encounter (Signed)
OK to refill, if not improved by the next 2-3 days to call for appointment.

## 2016-04-26 ENCOUNTER — Telehealth: Payer: Self-pay | Admitting: *Deleted

## 2016-04-26 MED ORDER — COLCHICINE 0.6 MG PO TABS: 0.6000 mg | ORAL_TABLET | Freq: Every day | ORAL | 2 refills | Status: DC | PRN

## 2016-04-26 NOTE — Telephone Encounter (Signed)
Pt called for refill of the colchicine.  Dr. Jacqualyn Posey ordered refill as previously.  Done.

## 2016-05-08 ENCOUNTER — Other Ambulatory Visit: Payer: Self-pay | Admitting: Internal Medicine

## 2016-05-16 ENCOUNTER — Encounter: Payer: Self-pay | Admitting: Podiatry

## 2016-05-16 ENCOUNTER — Ambulatory Visit (INDEPENDENT_AMBULATORY_CARE_PROVIDER_SITE_OTHER): Payer: BLUE CROSS/BLUE SHIELD | Admitting: Podiatry

## 2016-05-16 VITALS — BP 150/98 | HR 62 | Resp 16

## 2016-05-16 DIAGNOSIS — M7751 Other enthesopathy of right foot: Secondary | ICD-10-CM

## 2016-05-16 DIAGNOSIS — M778 Other enthesopathies, not elsewhere classified: Secondary | ICD-10-CM

## 2016-05-16 DIAGNOSIS — M10471 Other secondary gout, right ankle and foot: Secondary | ICD-10-CM

## 2016-05-16 DIAGNOSIS — M779 Enthesopathy, unspecified: Secondary | ICD-10-CM

## 2016-05-16 DIAGNOSIS — M79671 Pain in right foot: Secondary | ICD-10-CM

## 2016-05-16 MED ORDER — ALLOPURINOL 100 MG PO TABS
100.0000 mg | ORAL_TABLET | Freq: Every day | ORAL | 2 refills | Status: DC
Start: 2016-05-16 — End: 2016-06-26

## 2016-05-16 NOTE — Patient Instructions (Signed)

## 2016-05-21 MED ORDER — BETAMETHASONE SOD PHOS & ACET 6 (3-3) MG/ML IJ SUSP: 12.0000 mg | Freq: Once | INTRAMUSCULAR | Status: DC

## 2016-05-21 NOTE — Progress Notes (Signed)
Subjective:  Patient presents today with a history of gout for the last 5 years. Patient has recurrent gout attacks. Patient presents today with pain and tenderness to the right foot 3 days    Objective/Physical Exam General: The patient is alert and oriented x3 in no acute distress.  Dermatology: Skin is warm, dry and supple bilateral lower extremities. Negative for open lesions or macerations.  Vascular: Palpable pedal pulses bilaterally. No edema or erythema noted. Capillary refill within normal limits.  Neurological: Epicritic and protective threshold grossly intact bilaterally.   Musculoskeletal Exam: Pain on palpation and range of motion to the first MPJ of the right foot. Erythema and edema noted. Range of motion within normal limits to all pedal and ankle joints bilateral. Muscle strength 5/5 in all groups bilateral.    Assessment: #1 acute gout attack first MPJ right foot #2 pain in right foot   Plan of Care:  #1 Patient was evaluated. #2 injection of 0.5 mL Celestone Soluspan injected in the patient's right first MPJ. #3 prescription for allopurinol ordered #4 patient is to return to clinic when necessary   Dr. Edrick Kins, [place]

## 2016-06-26 ENCOUNTER — Encounter: Payer: Self-pay | Admitting: Internal Medicine

## 2016-06-26 ENCOUNTER — Ambulatory Visit (INDEPENDENT_AMBULATORY_CARE_PROVIDER_SITE_OTHER): Payer: BLUE CROSS/BLUE SHIELD | Admitting: Internal Medicine

## 2016-06-26 ENCOUNTER — Ambulatory Visit (INDEPENDENT_AMBULATORY_CARE_PROVIDER_SITE_OTHER): Payer: BLUE CROSS/BLUE SHIELD | Admitting: Podiatry

## 2016-06-26 ENCOUNTER — Encounter: Payer: Self-pay | Admitting: Gastroenterology

## 2016-06-26 VITALS — BP 128/86 | HR 67 | Temp 97.5°F | Ht 70.0 in | Wt 257.0 lb

## 2016-06-26 DIAGNOSIS — M76822 Posterior tibial tendinitis, left leg: Secondary | ICD-10-CM

## 2016-06-26 DIAGNOSIS — F39 Unspecified mood [affective] disorder: Secondary | ICD-10-CM

## 2016-06-26 DIAGNOSIS — M1000 Idiopathic gout, unspecified site: Secondary | ICD-10-CM | POA: Diagnosis not present

## 2016-06-26 DIAGNOSIS — M7752 Other enthesopathy of left foot: Secondary | ICD-10-CM | POA: Diagnosis not present

## 2016-06-26 DIAGNOSIS — M109 Gout, unspecified: Secondary | ICD-10-CM

## 2016-06-26 DIAGNOSIS — I480 Paroxysmal atrial fibrillation: Secondary | ICD-10-CM | POA: Diagnosis not present

## 2016-06-26 DIAGNOSIS — M25572 Pain in left ankle and joints of left foot: Secondary | ICD-10-CM

## 2016-06-26 DIAGNOSIS — I428 Other cardiomyopathies: Secondary | ICD-10-CM

## 2016-06-26 DIAGNOSIS — Z0000 Encounter for general adult medical examination without abnormal findings: Secondary | ICD-10-CM

## 2016-06-26 DIAGNOSIS — J209 Acute bronchitis, unspecified: Secondary | ICD-10-CM

## 2016-06-26 DIAGNOSIS — Z23 Encounter for immunization: Secondary | ICD-10-CM

## 2016-06-26 DIAGNOSIS — Z1211 Encounter for screening for malignant neoplasm of colon: Secondary | ICD-10-CM

## 2016-06-26 LAB — CBC WITH DIFFERENTIAL/PLATELET
BASOS ABS: 0 10*3/uL (ref 0.0–0.1)
Basophils Relative: 0.5 % (ref 0.0–3.0)
EOS ABS: 0 10*3/uL (ref 0.0–0.7)
Eosinophils Relative: 0 % (ref 0.0–5.0)
HEMATOCRIT: 38.7 % — AB (ref 39.0–52.0)
HEMOGLOBIN: 13.2 g/dL (ref 13.0–17.0)
LYMPHS PCT: 25.3 % (ref 12.0–46.0)
Lymphs Abs: 1.8 10*3/uL (ref 0.7–4.0)
MCHC: 34.1 g/dL (ref 30.0–36.0)
MCV: 89.2 fl (ref 78.0–100.0)
MONOS PCT: 13.7 % — AB (ref 3.0–12.0)
Monocytes Absolute: 1 10*3/uL (ref 0.1–1.0)
Neutro Abs: 4.3 10*3/uL (ref 1.4–7.7)
Neutrophils Relative %: 60.5 % (ref 43.0–77.0)
Platelets: 272 10*3/uL (ref 150.0–400.0)
RBC: 4.34 Mil/uL (ref 4.22–5.81)
RDW: 13.3 % (ref 11.5–15.5)
WBC: 7 10*3/uL (ref 4.0–10.5)

## 2016-06-26 LAB — LIPID PANEL
CHOL/HDL RATIO: 4
Cholesterol: 214 mg/dL — ABNORMAL HIGH (ref 0–200)
HDL: 50 mg/dL (ref >39.00)
Triglycerides: 408 mg/dL — ABNORMAL HIGH (ref 0.0–149.0)

## 2016-06-26 LAB — COMPREHENSIVE METABOLIC PANEL
ALBUMIN: 4.5 g/dL (ref 3.5–5.2)
ALK PHOS: 60 U/L (ref 39–117)
ALT: 47 U/L (ref 0–53)
AST: 41 U/L — AB (ref 0–37)
BILIRUBIN TOTAL: 1 mg/dL (ref 0.2–1.2)
BUN: 18 mg/dL (ref 6–23)
CALCIUM: 9.8 mg/dL (ref 8.4–10.5)
CO2: 32 mEq/L (ref 19–32)
CREATININE: 1.07 mg/dL (ref 0.40–1.50)
Chloride: 100 mEq/L (ref 96–112)
GFR: 77.83 mL/min (ref >60.00)
Glucose, Bld: 92 mg/dL (ref 70–99)
Potassium: 4.5 mEq/L (ref 3.5–5.1)
Sodium: 139 mEq/L (ref 135–145)
TOTAL PROTEIN: 7.7 g/dL (ref 6.0–8.3)

## 2016-06-26 LAB — T4, FREE: Free T4: 0.7 ng/dL (ref 0.60–1.60)

## 2016-06-26 LAB — URIC ACID: URIC ACID, SERUM: 6.5 mg/dL (ref 4.0–7.8)

## 2016-06-26 LAB — LDL CHOLESTEROL, DIRECT: LDL DIRECT: 102 mg/dL

## 2016-06-26 MED ORDER — DOXYCYCLINE HYCLATE 100 MG PO TABS: 100.0000 mg | ORAL_TABLET | Freq: Two times a day (BID) | ORAL | 1 refills | Status: DC

## 2016-06-26 MED ORDER — BETAMETHASONE SOD PHOS & ACET 6 (3-3) MG/ML IJ SUSP: 3.0000 mg | Freq: Once | INTRAMUSCULAR | Status: DC

## 2016-06-26 MED ORDER — COLCHICINE 0.6 MG PO TABS: 0.6000 mg | ORAL_TABLET | Freq: Every day | ORAL | 1 refills | Status: DC

## 2016-06-26 NOTE — Assessment & Plan Note (Signed)
Will increase allopurinol if uric acid >6

## 2016-06-26 NOTE — Assessment & Plan Note (Signed)
No CHF symptoms

## 2016-06-26 NOTE — Addendum Note (Signed)
Addended by: Pilar Grammes on: 06/26/2016 10:47 AM   Modules accepted: Orders

## 2016-06-26 NOTE — Assessment & Plan Note (Signed)
Regular now On xarelto 

## 2016-06-26 NOTE — Progress Notes (Signed)
Subjective:  Patient presents today for acute pain and tenderness to the left ankle. Patient states the pains been ongoing for approximately 3 days now. Patient does have a history of gout. Last gout attack was to the right first MPJ. This is a new complaint of gout to the left ankle today. Patient presents today for further treatment and evaluation    Objective/Physical Exam General: The patient is alert and oriented x3 in no acute distress.  Dermatology: Skin is warm, dry and supple bilateral lower extremities. Negative for open lesions or macerations.  Vascular: Palpable pedal pulses bilaterally. No edema or erythema noted. Capillary refill within normal limits.  Neurological: Epicritic and protective threshold grossly intact bilaterally.   Musculoskeletal Exam: Pain on palpation noted to the medial and posterior aspects of the left ankle. Pain on palpation also noted to the posterior tibial tendon suggestive of posterior tibial tendinitis.  Assessment: #1 acute gout left ankle joint #2 capsulitis left ankle #3 posterior tibial tendinitis left #4 pain in left foot and ankle   Plan of Care:  #1 Patient was evaluated. #2 injection of 0.5 mL Celestone Soluspan injected in the patient's left ankle joint #3 injection of 0.5 mL Celestone Soluspan injected in the patient's left posterior tibial tendon sheath posterior to the medial malleolus #4 prescription for colchicine 0.6 mg #5 patient is currently being managed by PCP for uric acid levels. Patient currently taking allopurinol. #6 return to clinic when necessary   Dr. Edrick Kins, [place]

## 2016-06-26 NOTE — Progress Notes (Signed)
Subjective:    Patient ID: [name], male    DOB: 1965-12-11, 50 y.o.   MRN: [id number]  HPI Here for physical  Having a terrible cold No sore throat or fever Just 2 weeks of cough--lots of mucus (yellow mucus) Some PND at night he thinks Only a bit congested in head and some right ear full ness Awakens him --though last night better No SOB  Ongoing back issues Seeing Dr Nelva Bush--- referred to him via [place] Tried injection without help Next thing would be nerve blocks of some kind  No heart problems Occasional palpitations No chest pain Still exercises-- some cardio (recumbent bike) and weights No SOB  Mood has been okay No exacerbations Continues on the fluoxetine  Current Outpatient Prescriptions on File Prior to Visit  Medication Sig Dispense Refill  . allopurinol (ZYLOPRIM) 100 MG tablet Take 1 tablet (100 mg total) by mouth daily. 30 tablet 2  . atorvastatin (LIPITOR) 20 MG tablet Take 1 tablet (20 mg total) by mouth daily. 90 tablet 2  . benazepril (LOTENSIN) 10 MG tablet take 1 tablet by mouth once daily 90 tablet 3  . carvedilol (COREG) 12.5 MG tablet take 1 tablet by mouth twice a day with food 60 tablet 0  . colchicine 0.6 MG tablet Take 1 tablet (0.6 mg total) by mouth daily as needed. 20 tablet 2  . desoximetasone (TOPICORT) 0.25 % cream Apply 1 application topically 2 (two) times daily. 30 g 0  . FLUoxetine (PROZAC) 20 MG capsule Take 3 capsules (60 mg total) by mouth daily. 270 capsule 2  . Multiple Vitamin (MULTIVITAMIN) tablet Take 1 tablet by mouth daily.    Alveda Reasons 20 MG TABS tablet take 1 tablet by mouth once daily 30 tablet 3   Current Facility-Administered Medications on File Prior to Visit  Medication Dose Route Frequency Provider Last Rate Last Dose  . betamethasone acetate-betamethasone sodium phosphate (CELESTONE) injection 12 mg  12 mg Intramuscular Once Edrick Kins, DPM      . triamcinolone acetonide (KENALOG) 10 MG/ML injection 5 mg   5 mg Intra-articular Once Wallene Huh, DPM        No Known Allergies  Past Medical History:  Diagnosis Date  . Atrial fibrillation ([place]) 1989  . Bicuspid aortic valve    Last echo looks trileaflet  . Cardiomyopathy 08/2009   Mildly decreased EF  . Depression   . GERD (gastroesophageal reflux disease)   . Gout   . Hyperlipidemia   . Hypertension     Past Surgical History:  Procedure Laterality Date  . CARDIAC CATHETERIZATION  03/2010   No sig coronary disease, EF ~40%  . CARDIOVERSION  1989  . HEAT STROKE/SEIZURES  1990s    Family History  Problem Relation Age of Onset  . Heart attack Father     MI  . Diabetes Mother   . Depression Mother   . Arthritis Mother   . Coronary artery disease Paternal Grandfather   . Coronary artery disease Paternal Uncle   . Crohn's disease Brother   . Cancer Neg Hx   . Hypertension Neg Hx     Social History   Social History  . Marital status: Married    Spouse name: N/A  . Number of children: 1  . 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 Smoking status: Never Smoker  . Smokeless  tobacco: Never Used  . Alcohol use No     Comment: occassional  . Drug use: No  . Sexual activity: Not on file   Other Topics Concern  . Not on file   Social History Narrative   Married with 1 son   Review of Systems  Constitutional: Negative for fatigue and unexpected weight change.       Wears seat belt  HENT: Positive for hearing loss. Negative for dental problem.        Did get hearing aides through the [place] up with dentist  Eyes: Negative for visual disturbance.       No diplopia or unilateral vision loss  Respiratory: Positive for cough. Negative for chest tightness and shortness of breath.   Cardiovascular: Positive for palpitations and leg swelling. Negative for chest pain.       Occ mild ankle edema at end of day  Gastrointestinal:  Negative for abdominal pain, constipation and nausea.       Some recent bloating Occasional heartburn-- tums will help  Endocrine: Negative for polydipsia and polyuria.  Genitourinary: Positive for urgency. Negative for difficulty urinating.       No sexual problems  Musculoskeletal: Positive for arthralgias and back pain.       Recent wrist injection ---- carpal tunnel Gout flares at times---sees Dr Amalia Hailey  Skin:       Still has spots on legs since [place] Multiple skin tags  Allergic/Immunologic: Negative for environmental allergies and immunocompromised state.  Neurological: Positive for dizziness. Negative for syncope, light-headedness and headaches.  Hematological: Negative for adenopathy. Bruises/bleeds easily.  Psychiatric/Behavioral:       Sleeps okay except with this cold Anxiety around people--- PTSD       Objective:   Physical Exam  Constitutional: He is oriented to person, place, and time. He appears well-developed and well-nourished. No distress.  HENT:  Head: Normocephalic and atraumatic.  Right Ear: External ear normal.  Left Ear: External ear normal.  Mouth/Throat: Oropharynx is clear and moist. No oropharyngeal exudate.  Eyes: Conjunctivae are normal. Pupils are equal, round, and reactive to light.  Neck: Normal range of motion. Neck supple. No thyromegaly present.  Cardiovascular: Normal rate, regular rhythm, normal heart sounds and intact distal pulses.  Exam reveals no gallop.   No murmur heard. Pulmonary/Chest: Effort normal and breath sounds normal. No respiratory distress. He has no wheezes. He has no rales.  Abdominal: Soft. There is no tenderness.  Musculoskeletal: He exhibits no edema or tenderness.  Lymphadenopathy:    He has no cervical adenopathy.  Neurological: He is alert and oriented to person, place, and time.  Skin: No rash noted.  Psychiatric: He has a normal mood and affect. His behavior is normal.          Assessment & Plan:

## 2016-06-26 NOTE — Assessment & Plan Note (Signed)
2 weeks of persistent productive cough Will try antibiotic

## 2016-06-26 NOTE — Progress Notes (Signed)
Pre visit review using our clinic review tool, if applicable. No additional management support is needed unless otherwise documented below in the visit note. 

## 2016-06-26 NOTE — Assessment & Plan Note (Signed)
Depression/anxiety from PTSD Continue med

## 2016-06-26 NOTE — Assessment & Plan Note (Signed)
Healthy Will set up colon Discussed fitness efforts Flu/prevnar today

## 2016-06-27 ENCOUNTER — Other Ambulatory Visit: Payer: Self-pay

## 2016-06-27 MED ORDER — ALLOPURINOL 100 MG PO TABS: 200.0000 mg | ORAL_TABLET | Freq: Every day | ORAL | 3 refills | Status: DC

## 2016-06-27 NOTE — Telephone Encounter (Signed)
New rx for allopurinol 100mg  sent to pharmacy per lab results

## 2016-06-27 NOTE — Telephone Encounter (Signed)
Spoke to pt. His MyChart is not working. Gave him his results

## 2016-07-06 ENCOUNTER — Encounter: Payer: Self-pay | Admitting: Primary Care

## 2016-07-06 ENCOUNTER — Ambulatory Visit (INDEPENDENT_AMBULATORY_CARE_PROVIDER_SITE_OTHER): Payer: BLUE CROSS/BLUE SHIELD | Admitting: Primary Care

## 2016-07-06 VITALS — BP 118/70 | HR 59 | Temp 98.2°F | Ht 70.0 in | Wt 255.1 lb

## 2016-07-06 DIAGNOSIS — R059 Cough, unspecified: Secondary | ICD-10-CM

## 2016-07-06 DIAGNOSIS — R05 Cough: Secondary | ICD-10-CM | POA: Diagnosis not present

## 2016-07-06 MED ORDER — RANITIDINE HCL 150 MG PO TABS: 150.0000 mg | ORAL_TABLET | Freq: Two times a day (BID) | ORAL | 0 refills | Status: DC

## 2016-07-06 NOTE — Progress Notes (Signed)
Pre visit review using our clinic review tool, if applicable. No additional management support is needed unless otherwise documented below in the visit note. 

## 2016-07-06 NOTE — Progress Notes (Signed)
   Subjective:    Patient ID: [name], male    DOB: [birth year], 50 y.o.   MRN: [id number]  HPI  [name] is a 50 year old male who presents today with a chief complaint of cough. He was evaluated on 06/26/16 with a chief complaint of a two week history of persistent cough, post nasal drip, ear fullness, and mild congestion. He was treated with a course of Doxycycline.  Since his last visit he's noticed some improvement in the cold symptoms but he continues to experience a cough. His cough is worse after eating, exercise, and at night. He will experience coughing spells that will last several minutes. He denies esophageal burning, epigastric pain, nausea; he does have a history of heartburn which does not require daily medication. He describes his cough as a "tickle" in the back of his throat. He does remember several months ago feeling the "tickle" to his throat that caused coughing. He is currently managed on benazepril 10 mg.   Warm fluids help his cough temporarily, nothing else he's taken OTC has eliminated his cough. He has never used tobacco products and denies history of asthma.  Review of Systems  Constitutional: Negative for chills, fatigue and fever.  HENT: Negative for congestion and sore throat.   Respiratory: Positive for cough. Negative for shortness of breath and wheezing.   Cardiovascular: Negative for chest pain.  Gastrointestinal: Negative for abdominal pain and nausea.       Denies esophageal burning       Objective:   Physical Exam  Constitutional: He appears well-nourished. He does not appear ill.  HENT:  Right Ear: Tympanic membrane and ear canal normal.  Left Ear: Tympanic membrane and ear canal normal.  Nose: No mucosal edema. Right sinus exhibits no maxillary sinus tenderness and no frontal sinus tenderness. Left sinus exhibits no maxillary sinus tenderness and no frontal sinus tenderness.  Mouth/Throat: Oropharynx is clear and moist.  Eyes:  Conjunctivae are normal.  Neck: Neck supple.  Cardiovascular: Normal rate and regular rhythm.   Pulmonary/Chest: Effort normal and breath sounds normal. He has no wheezes. He has no rales.  Skin: Skin is warm and dry.          Assessment & Plan:  Cough:  Present since early November. Treated for bacterial bronchitis on 06/26/16, only slight improvement of other cold symptoms. Does remember this occurring several months ago. Exam today with clear lungs, does not appear ill, stable vitals. Long discussion regarding etiology of cough. Could be post bronchitic cough, could be silent GERD, could be ACE induced cough. Since symptoms are worse with eating, laying down, and with exercise will treat first for silent GERD. Rather than pulling him off of his ACE for which he's taken for years, will trial H2 Blocker x 2-4 weeks for possible reflux induced cough. If no improvement and there's lack of bacterial/viral involvement, then consider holding ACE. We will check on him in 2 weeks. Information provided regarding triggers of acid reflux.  Sheral Flow, NP

## 2016-07-06 NOTE — Patient Instructions (Signed)
Your symptoms of cough could be caused by either heartburn or your benazepril.   Start ranitidine (Zantac) 150 mg tablets for heartburn. Take 1 tablet by mouth twice daily for 4 weeks.  Please call me if no improvement in your cough in 2 weeks.   It was a pleasure meeting you!   Food Choices for Gastroesophageal Reflux Disease, Adult When you have gastroesophageal reflux disease (GERD), the foods you eat and your eating habits are very important. Choosing the right foods can help ease your discomfort. What guidelines do I need to follow?  Choose fruits, vegetables, whole grains, and low-fat dairy products.  Choose low-fat meat, fish, and poultry.  Limit fats such as oils, salad dressings, butter, nuts, and avocado.  Keep a food diary. This helps you identify foods that cause symptoms.  Avoid foods that cause symptoms. These may be different for everyone.  Eat small meals often instead of 3 large meals a day.  Eat your meals slowly, in a place where you are relaxed.  Limit fried foods.  Cook foods using methods other than frying.  Avoid drinking alcohol.  Avoid drinking large amounts of liquids with your meals.  Avoid bending over or lying down until 2-3 hours after eating. What foods are not recommended? These are some foods and drinks that may make your symptoms worse: Vegetables  Tomatoes. Tomato juice. Tomato and spaghetti sauce. Chili peppers. Onion and garlic. Horseradish. Fruits  Oranges, grapefruit, and lemon (fruit and juice). Meats  High-fat meats, fish, and poultry. This includes hot dogs, ribs, ham, sausage, salami, and bacon. Dairy  Whole milk and chocolate milk. Sour cream. Cream. Butter. Ice cream. Cream cheese. Drinks  Coffee and tea. Bubbly (carbonated) drinks or energy drinks. Condiments  Hot sauce. Barbecue sauce. Sweets/Desserts  Chocolate and cocoa. Donuts. Peppermint and spearmint. Fats and Oils  High-fat foods. This includes [place] fries and  potato chips. Other  Vinegar. Strong spices. This includes black pepper, white pepper, red pepper, cayenne, curry powder, cloves, ginger, and chili powder. The items listed above may not be a complete list of foods and drinks to avoid. Contact your dietitian for more information.  This information is not intended to replace advice given to you by your health care provider. Make sure you discuss any questions you have with your health care provider. Document Released: 01/22/2012 Document Revised: 12/29/2015 Document Reviewed: 05/27/2013 Elsevier Interactive Patient Education  2017 [employer].

## 2016-07-18 ENCOUNTER — Telehealth: Payer: Self-pay | Admitting: Primary Care

## 2016-07-18 NOTE — Telephone Encounter (Addendum)
-----   Message from Pleas Koch, NP sent at 07/06/2016  9:28 AM EST ----- Regarding: Cough Please check on patient's cough since we started Zantac 150 mg BID.

## 2016-07-18 NOTE — Telephone Encounter (Signed)
Left message for pt to call back with an update

## 2016-07-23 ENCOUNTER — Other Ambulatory Visit: Payer: Self-pay | Admitting: Internal Medicine

## 2016-07-23 ENCOUNTER — Other Ambulatory Visit: Payer: Self-pay | Admitting: Primary Care

## 2016-07-23 DIAGNOSIS — R05 Cough: Secondary | ICD-10-CM

## 2016-07-23 DIAGNOSIS — R059 Cough, unspecified: Secondary | ICD-10-CM

## 2016-07-23 NOTE — Telephone Encounter (Signed)
Message left for patient to return my call.  

## 2016-07-24 NOTE — Telephone Encounter (Signed)
Spoken to patient and he stated that the Zantac did not help his cough.

## 2016-07-24 NOTE — Telephone Encounter (Signed)
If he's continuing to cough the I recommend he hold his benazepril for 1 week as this is likely the cause. Have him monitor his BP as we will call him for an update in 1 week.

## 2016-07-24 NOTE — Telephone Encounter (Signed)
Message left for patient to return my call.  

## 2016-07-24 NOTE — Telephone Encounter (Signed)
Spoken to patient and he stated that the Zantac did not help.

## 2016-07-25 ENCOUNTER — Encounter: Payer: Self-pay | Admitting: Family Medicine

## 2016-07-25 ENCOUNTER — Ambulatory Visit (INDEPENDENT_AMBULATORY_CARE_PROVIDER_SITE_OTHER): Payer: BLUE CROSS/BLUE SHIELD | Admitting: Family Medicine

## 2016-07-25 DIAGNOSIS — R05 Cough: Secondary | ICD-10-CM

## 2016-07-25 DIAGNOSIS — R053 Chronic cough: Secondary | ICD-10-CM | POA: Insufficient documentation

## 2016-07-25 MED ORDER — LORATADINE 10 MG PO TABS: 10.0000 mg | ORAL_TABLET | Freq: Every day | ORAL | 11 refills | Status: DC

## 2016-07-25 NOTE — Patient Instructions (Signed)
Great to see you. Happy Holidays!   Try adding an allegra or claritin (without a decongestant).  Let us know if your cough doesn't improve after stopping your blood pressure medication.

## 2016-07-25 NOTE — Addendum Note (Signed)
Addended by: Lucille Passy on: 07/25/2016 01:14 PM   Modules accepted: Orders

## 2016-07-25 NOTE — Assessment & Plan Note (Signed)
Lung exam reassuring. Likely multifactorial - does seem to have a lot of PND. Advised adding OTC antihistamine like claritin or allegra. Also agree with stopping his ACEI- managed by cardiologist at the [facility]. Call or return to clinic prn if these symptoms worsen or fail to improve as anticipated. The patient indicates understanding of these issues and agrees with the plan.

## 2016-07-25 NOTE — Telephone Encounter (Signed)
Per DPR, left detail message for patient if Kate's comments.

## 2016-07-25 NOTE — Progress Notes (Signed)
Subjective:   Patient ID: [name], male    DOB: 01-Jul-1966, 50 y.o.   MRN: [id number]  [name] is a pleasant 50 y.o. year old male pt of Dr. Silvio Pate, new to me, who presents to clinic today with Cough (nasal drainage, HA )  on 07/25/2016  HPI:  Has been evaluated multiple times for this complaint- chart reviewed.  Was seen on 11/21 with 2 weeks of cough, PND and congestion.  Treated with course of doxycyline.  Then saw Allie Bossier, NP on 07/06/16 for persistent symptoms.  At that [place], he reported that he cold symptoms were improving but he continued to cough.  Had coughing spells that would last several minutes and complained of a persistent tickle in his throat. He also had some cough symptoms that were worse with eating and laying down.  Exam was reassuring.    Anda Kraft advised to try an H2 blocker to treat GERD for two weeks.  He then called on 07/18/16 stating that he tried zantac but it did not improve his cough. She appropriately advised holding his ACEI (Benazapril) for 1 week but he did not return message.  He spoke with his cardiologist at the [facility] who is going to be changing him from ACEI "to something else."  Current Outpatient Prescriptions on File Prior to Visit  Medication Sig Dispense Refill  . allopurinol (ZYLOPRIM) 100 MG tablet Take 2 tablets (200 mg total) by mouth daily. 180 tablet 3  . atorvastatin (LIPITOR) 20 MG tablet Take 1 tablet (20 mg total) by mouth daily. 90 tablet 2  . benazepril (LOTENSIN) 10 MG tablet take 1 tablet by mouth once daily 90 tablet 3  . carvedilol (COREG) 12.5 MG tablet take 1 tablet by mouth twice a day with food 60 tablet 0  . colchicine 0.6 MG tablet Take 1 tablet (0.6 mg total) by mouth daily. 10 tablet 1  . desoximetasone (TOPICORT) 0.25 % cream Apply 1 application topically 2 (two) times daily. 30 g 0  . FLUoxetine (PROZAC) 20 MG capsule take 3 capsules by mouth once daily 270 capsule 3  . Multiple Vitamin  (MULTIVITAMIN) tablet Take 1 tablet by mouth daily.    . ranitidine (ZANTAC) 150 MG tablet take 1 tablet by mouth twice a day 60 tablet 0  . XARELTO 20 MG TABS tablet take 1 tablet by mouth once daily 30 tablet 3   Current Facility-Administered Medications on File Prior to Visit  Medication Dose Route Frequency Provider Last Rate Last Dose  . betamethasone acetate-betamethasone sodium phosphate (CELESTONE) injection 12 mg  12 mg Intramuscular Once Edrick Kins, DPM      . betamethasone acetate-betamethasone sodium phosphate (CELESTONE) injection 3 mg  3 mg Intramuscular Once Edrick Kins, DPM      . triamcinolone acetonide (KENALOG) 10 MG/ML injection 5 mg  5 mg Intra-articular Once Wallene Huh, DPM        No Known Allergies  Past Medical History:  Diagnosis Date  . Atrial fibrillation ([place]) 1989  . Bicuspid aortic valve    Last echo looks trileaflet  . Cardiomyopathy 08/2009   Mildly decreased EF  . Depression   . GERD (gastroesophageal reflux disease)   . Gout   . Hyperlipidemia   . Hypertension     Past Surgical History:  Procedure Laterality Date  . CARDIAC CATHETERIZATION  03/2010   No sig coronary disease, EF ~40%  . CARDIOVERSION  1989  . HEAT STROKE/SEIZURES  1990s  Family History  Problem Relation Age of Onset  . Heart attack Father     MI  . Diabetes Mother   . Depression Mother   . Arthritis Mother   . Coronary artery disease Paternal Grandfather   . Coronary artery disease Paternal Uncle   . Crohn's disease Brother   . Cancer Neg Hx   . Hypertension Neg Hx     Social History   Social History  . Marital status: Married    Spouse name: N/A  . Number of children: 1  . 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 Smoking status: Never Smoker  . Smokeless tobacco: Never Used  . Alcohol use No     Comment: occassional  . Drug use: No  . Sexual activity:  Not on file   Other Topics Concern  . Not on file   Social History Narrative   Married with 1 son   The PMH, PSH, Social History, Family History, Medications, and allergies have been reviewed in [facility], and have been updated if relevant.   Review of Systems  Constitutional: Negative for fatigue and fever.  HENT: Positive for congestion, postnasal drip and rhinorrhea.   Respiratory: Positive for cough. Negative for shortness of breath, wheezing and stridor.   Cardiovascular: Negative.   Gastrointestinal: Negative.   Endocrine: Negative.   Genitourinary: Negative.   Musculoskeletal: Negative.   Neurological: Positive for headaches.  Hematological: Negative.  Negative for adenopathy.  Psychiatric/Behavioral: Negative.   All other systems reviewed and are negative.      Objective:    BP 120/80   Pulse 80   Temp 97.5 F (36.4 C)   Wt 254 lb (115.2 kg)   SpO2 96%   BMI 36.45 kg/m    Physical Exam  Constitutional: He is oriented to person, place, and time. He appears well-developed and well-nourished. No distress.  HENT:  Head: Normocephalic and atraumatic.  + PND  Eyes: Conjunctivae are normal.  Cardiovascular: Normal rate and regular rhythm.   Pulmonary/Chest: Effort normal and breath sounds normal. No respiratory distress. He has no wheezes.  Musculoskeletal: Normal range of motion. He exhibits no edema.  Neurological: He is alert and oriented to person, place, and time. No cranial nerve deficit.  Skin: Skin is warm and dry. He is not diaphoretic.  Psychiatric: He has a normal mood and affect. His behavior is normal. Judgment and thought content normal.  Nursing note and vitals reviewed.         Assessment & Plan:   Cough, persistent No Follow-up on file.

## 2016-08-08 ENCOUNTER — Telehealth: Payer: Self-pay | Admitting: *Deleted

## 2016-08-08 NOTE — Telephone Encounter (Addendum)
Pt states Dr. Amalia Hailey has him taking Allopurinol 100mg  2 times a day, and is having a problem since holidays and wanted to know if Dr. Amalia Hailey wanted him to take 3 times a day. 08/09/2016-I informed pt of Dr. Amalia Hailey recommendations and pt states he understands, and has an appt today, he has to have shot for this gout.

## 2016-08-08 NOTE — Telephone Encounter (Signed)
Please have him refer to his PCP. Allopurinol is being prescribed and managed by his PCP.  3 times a day is still within the normal recommended dosage though, so if he feels that he needs to increase his dose, it is fine with me, but it is best managed by his PCP.  Thanks, Dr. Amalia Hailey

## 2016-08-09 ENCOUNTER — Encounter: Payer: Self-pay | Admitting: Podiatry

## 2016-08-09 ENCOUNTER — Ambulatory Visit (INDEPENDENT_AMBULATORY_CARE_PROVIDER_SITE_OTHER): Payer: BLUE CROSS/BLUE SHIELD | Admitting: Podiatry

## 2016-08-09 ENCOUNTER — Telehealth: Payer: Self-pay

## 2016-08-09 ENCOUNTER — Ambulatory Visit (INDEPENDENT_AMBULATORY_CARE_PROVIDER_SITE_OTHER): Payer: BLUE CROSS/BLUE SHIELD

## 2016-08-09 VITALS — BP 138/91 | HR 68 | Temp 97.6°F | Resp 18

## 2016-08-09 DIAGNOSIS — M1A0710 Idiopathic chronic gout, right ankle and foot, without tophus (tophi): Secondary | ICD-10-CM

## 2016-08-09 DIAGNOSIS — M5441 Lumbago with sciatica, right side: Principal | ICD-10-CM

## 2016-08-09 DIAGNOSIS — M109 Gout, unspecified: Secondary | ICD-10-CM

## 2016-08-09 DIAGNOSIS — M7751 Other enthesopathy of right foot: Secondary | ICD-10-CM

## 2016-08-09 DIAGNOSIS — G8929 Other chronic pain: Secondary | ICD-10-CM

## 2016-08-09 NOTE — Telephone Encounter (Signed)
His uric acid was pretty good but not as low as we want. He should increase to 300mg  a day (can send new prescription and just take once a day). He shouldn't increase til the gout attack has quieted down (like by taking the colchicine for several days)

## 2016-08-09 NOTE — Telephone Encounter (Signed)
Pt left v/m; pt is taking allopurinol 100 mg bid; pt has experienced gout x 3 in last 3 weeks and pt wants to know if should increase allopurinol 100 mg to tid. Pt also request referral to [facility]. Pt request cb.

## 2016-08-09 NOTE — Telephone Encounter (Signed)
Left a message on vm per dpr. Asked pt to call me back, also so I could verify his pharmacy

## 2016-08-10 MED ORDER — ALLOPURINOL 300 MG PO TABS: 300.0000 mg | ORAL_TABLET | Freq: Every day | ORAL | 3 refills | Status: DC

## 2016-08-10 NOTE — Telephone Encounter (Signed)
Left vm to call back, again

## 2016-08-10 NOTE — Telephone Encounter (Signed)
Spoke to pt. Sent new RX

## 2016-08-12 MED ORDER — BETAMETHASONE SOD PHOS & ACET 6 (3-3) MG/ML IJ SUSP: 3.0000 mg | Freq: Once | INTRAMUSCULAR | Status: DC

## 2016-08-12 NOTE — Progress Notes (Signed)
Subjective:  Patient presents today for follow-up evaluation of the gout attack to the left ankle joint. Patient states that he's had approximately 3 gout attacks in the past year. Patient states that now he has pain in the first MPJ of the right foot. Patient has been diagnosed with gout through elevated uric acid levels. Patient presents today for further treatment and evaluation    Objective/Physical Exam General: The patient is alert and oriented x3 in no acute distress.  Dermatology: Skin is warm, dry and supple bilateral lower extremities. Negative for open lesions or macerations.  Vascular: Palpable pedal pulses bilaterally. No edema or erythema noted. Capillary refill within normal limits.  Neurological: Epicritic and protective threshold grossly intact bilaterally.   Musculoskeletal Exam: Pain on palpation and range of motion of the first MPJ right foot. Left ankle joint is negative for any pain on palpation. Range of motion within normal limits to all pedal and ankle joints bilateral. Muscle strength 5/5 in all groups bilateral.    Assessment: #1 acute gout attack first MPJ right foot #2 acute gout left ankle joint-resolved #3 pain in right foot   Plan of Care:  #1 Patient was evaluated. #2 injection of 0.5 mL Celestone Soluspan injected in the first MPJ right foot #3 prescription for colchicine #20 with instructions not to take greater than 5 days #4 return to clinic when necessary   Edrick Kins, DPM [facility]  Dr. Edrick Kins, [address]                                        [place], [place] [address]                Office [phone]  Fax [phone]

## 2016-08-21 ENCOUNTER — Telehealth: Payer: Self-pay | Admitting: *Deleted

## 2016-08-21 NOTE — Telephone Encounter (Signed)
Patient left a voicemail stating that he has had 5 flare-ups with gout since 07/06/16. Patient stated that he is on allopurinol and wants to know if there is any other preventative medication he can take to stop these flare-ups?

## 2016-08-21 NOTE — Telephone Encounter (Signed)
I would have to check his uric acid level again to see if he is on enough allopurinol. That is still the best preventative (there is another that is not any better--but much more money) What is he taking for the attacks? Allopurinol can prevent but not treat If he needs more colchicine--okay to refill #60 x 1 (1 bid prn)

## 2016-08-21 NOTE — Telephone Encounter (Signed)
Left detailed message on vm per dpr. Advised him to call me if he needed a refill of colchicine

## 2016-08-27 ENCOUNTER — Encounter: Payer: Self-pay | Admitting: Gastroenterology

## 2016-08-27 ENCOUNTER — Ambulatory Visit (INDEPENDENT_AMBULATORY_CARE_PROVIDER_SITE_OTHER): Payer: BLUE CROSS/BLUE SHIELD | Admitting: Gastroenterology

## 2016-08-27 VITALS — BP 120/70 | HR 82 | Ht 71.0 in | Wt 259.0 lb

## 2016-08-27 DIAGNOSIS — Z1211 Encounter for screening for malignant neoplasm of colon: Secondary | ICD-10-CM

## 2016-08-27 DIAGNOSIS — I428 Other cardiomyopathies: Secondary | ICD-10-CM | POA: Diagnosis not present

## 2016-08-27 DIAGNOSIS — Z7901 Long term (current) use of anticoagulants: Secondary | ICD-10-CM | POA: Diagnosis not present

## 2016-08-27 DIAGNOSIS — I480 Paroxysmal atrial fibrillation: Secondary | ICD-10-CM

## 2016-08-27 NOTE — Progress Notes (Signed)
[name] 51 y.o. 05-Jan-1966 [id number]   Subjective:   Chief Complaint: Discussion for screening colonoscopy   HPI This is a 51 year old male with PMH of paroxsymal atrial fibrillation on Xarelto therapy, here to discuss initial screening colonoscopy due to age.  He states he has been taking Xarelto for 5-6 years and underwent a successful cardioversion to convert to sinus rhythm.  Denies rectal bleeding.  No family history of any cancer or colonic polyps.   Admits to taking Ibuprofen twice daily, a few days a week for back pain.     Current Meds  Medication Sig  . allopurinol (ZYLOPRIM) 300 MG tablet Take 1 tablet (300 mg total) by mouth daily.  Marland Kitchen atorvastatin (LIPITOR) 20 MG tablet Take 1 tablet (20 mg total) by mouth daily.  . carvedilol (COREG) 12.5 MG tablet take 1 tablet by mouth twice a day with food  . colchicine 0.6 MG tablet Take 1 tablet (0.6 mg total) by mouth daily.  Marland Kitchen desoximetasone (TOPICORT) 0.25 % cream Apply 1 application topically 2 (two) times daily as needed.  Marland Kitchen FLUoxetine (PROZAC) 20 MG capsule take 3 capsules by mouth once daily  . loratadine (CLARITIN) 10 MG tablet Take 10 mg by mouth daily as needed for allergies.  . Multiple Vitamin (MULTIVITAMIN) tablet Take 1 tablet by mouth daily.  Alveda Reasons 20 MG TABS tablet take 1 tablet by mouth once daily               Past Medical History:  Diagnosis Date  . Atrial fibrillation ([place]) 1989  . Bicuspid aortic valve    Last echo looks trileaflet  . Cardiomyopathy 08/2009   Mildly decreased EF  . Depression   . GERD (gastroesophageal reflux disease)   . Gout   . Heat stroke    caused a seizure, occured when he was in the [employer]  . Hyperlipidemia   . Hypertension     Past Surgical History:  Procedure Laterality Date  . CARDIAC CATHETERIZATION  03/2010   No sig coronary disease, EF ~40%  . CARDIOVERSION  1989  . TOTAL KNEE ARTHROPLASTY Left     No Known Allergies    Social History    Social History  . Marital status: Married    Spouse name: N/A  . Number of children: 1  . Years of education: N/A   Occupational History  . Sales    Social History Main Topics  . Smoking status: Never Smoker  . Smokeless tobacco: Never Used  . Alcohol use 0.0 oz/week     Comment: occassional  . Drug use: No  . Sexual activity: Not on file   Other Topics Concern  . Not on file   Social History Narrative   Married with 1 son    Review of Systems All other ROS negative    Objective:   Physical Exam BP 120/70   Pulse 82   Ht 5\' 11"  (1.803 m)   Wt 259 lb (117.5 kg)   BMI 36.12 kg/m  GA: Well developed, well nourished male in NAD HEENT: Oral musosa moist without ulcerations, good dentition Heart: RRR, no m/r/g Lungs: CTAB, no wheezes, rales, rhonchi Abdomen: Obese, soft, non-tender, non-distended abdomen.  No masses felt.  Normoactive BS Psych: Normal mood and affect Neuro: Alert and oriented x 3   Assessment & Plan:   Encounter Diagnoses  Name Primary?  . Special screening for malignant neoplasms, colon Yes  . Cardiomyopathy, nonischemic ([place])   .  Paroxysmal atrial fibrillation ([facility])   . Long term current use of anticoagulant    Screening colonoscopy will be scheduled outpatient at the hospital given ECHO performed in 2013 revealed left ventricular ejection fraction of 30-35%.  He has an appt with cardiologist, Dr. Caryl Comes, this week and will ask if he recommends a repeat ECHO in the near future.  If this is the case, and the EF has improved, will reconsider location of colonoscopy.  He was asked to stop taking the Xarelto 2 days prior to procedure.   Carlena Hurl, PA-S  I saw the patient with the PA student acting as a scribe. The history, exam, assessment and plan are mine.

## 2016-08-27 NOTE — Patient Instructions (Signed)
If you are age 50 or older, your body mass index should be between 23-30. Your Body mass index is 36.12 kg/m. If this is out of the aforementioned range listed, please consider follow up with your Primary Care Provider.  If you are age 64 or younger, your body mass index should be between 19-25. Your Body mass index is 36.12 kg/m. If this is out of the aformentioned range listed, please consider follow up with your Primary Care Provider.   It has been recommended to you by your physician that you have a(n) colonoscopy completed. Per your request, we did not schedule the procedure(s) today. Please contact our office at [phone] should you decide to have the procedure completed.  Thank you for choosing [facility]  Dr Wilfrid Lund III

## 2016-08-30 ENCOUNTER — Encounter: Payer: Self-pay | Admitting: Internal Medicine

## 2016-08-30 ENCOUNTER — Ambulatory Visit (INDEPENDENT_AMBULATORY_CARE_PROVIDER_SITE_OTHER): Payer: BLUE CROSS/BLUE SHIELD | Admitting: Internal Medicine

## 2016-08-30 VITALS — BP 120/72 | HR 68 | Ht 71.0 in | Wt 252.5 lb

## 2016-08-30 DIAGNOSIS — I480 Paroxysmal atrial fibrillation: Secondary | ICD-10-CM

## 2016-08-30 DIAGNOSIS — I10 Essential (primary) hypertension: Secondary | ICD-10-CM | POA: Diagnosis not present

## 2016-08-30 DIAGNOSIS — I493 Ventricular premature depolarization: Secondary | ICD-10-CM | POA: Diagnosis not present

## 2016-08-30 DIAGNOSIS — I428 Other cardiomyopathies: Secondary | ICD-10-CM

## 2016-08-30 MED ORDER — LOSARTAN POTASSIUM 25 MG PO TABS: 25.0000 mg | ORAL_TABLET | Freq: Every day | ORAL | 3 refills | Status: DC

## 2016-08-30 NOTE — Progress Notes (Signed)
Patient Care Team: Venia Carbon, MD as PCP - General   HPI  [name] is a 51 y.o. male Seen in followup for atrial fibrillation that was newly identified 2012. He underwent TEE guided cardioversion with restoration of sinus rhythm. Ejection fraction at that time was 30%. There was also concern about a bicuspid aortic valve.  He takes Rivaroxaban  Repeat  Echo demonstrated marked improvement in his ejection fraction of 40-45%. there was NO evidence of bicuspid aortic valve .  Repeat US 2013 EF 30-35% MRI>>40-45% 10/15 without WMA or DE  DATE TEST    2012    Echo   EF 30 %   ? bicuspid  10/13    Echo   EF 30-35 %   trileaflet  10/15    MRI EF 43 %   No LGE          He has had palpitations >> 2014  Holter monitor-48 hours demonstrated frequent PVCs -- about 2000 ie 1%  TSH was borderline elevated ; electrolytes were normal  His PVCs were much better since   coffee from 4->>1 per day.     Notes were reviewed 12/17 a persistent cough and Ace inhibitors were discontinued. Office in the context of nasal drainage and headache.  Struggling w gout- recurrent     Past Medical History:  Diagnosis Date  . Atrial fibrillation ([place]) 1989  . Bicuspid aortic valve    Last echo looks trileaflet  . Cardiomyopathy 08/2009   Mildly decreased EF  . Depression   . GERD (gastroesophageal reflux disease)   . Gout   . Heat stroke    caused a seizure, occured when he was in the [employer]  . Hyperlipidemia   . Hypertension     Past Surgical History:  Procedure Laterality Date  . CARDIAC CATHETERIZATION  03/2010   No sig coronary disease, EF ~40%  . CARDIOVERSION  1989  . TOTAL KNEE ARTHROPLASTY Left     Current Outpatient Prescriptions  Medication Sig Dispense Refill  . allopurinol (ZYLOPRIM) 300 MG tablet Take 1 tablet (300 mg total) by mouth daily. 90 tablet 3  . atorvastatin (LIPITOR) 20 MG tablet Take 1 tablet (20 mg total) by mouth daily. 90 tablet 2  . carvedilol  (COREG) 12.5 MG tablet take 1 tablet by mouth twice a day with food 60 tablet 0  . colchicine 0.6 MG tablet Take 1 tablet (0.6 mg total) by mouth daily. 10 tablet 1  . desoximetasone (TOPICORT) 0.25 % cream Apply 1 application topically 2 (two) times daily as needed.    Marland Kitchen FLUoxetine (PROZAC) 20 MG capsule take 3 capsules by mouth once daily 270 capsule 3  . loratadine (CLARITIN) 10 MG tablet Take 10 mg by mouth daily as needed for allergies.    . Multiple Vitamin (MULTIVITAMIN) tablet Take 1 tablet by mouth daily.    Alveda Reasons 20 MG TABS tablet take 1 tablet by mouth once daily 30 tablet 3   Current Facility-Administered Medications  Medication Dose Route Frequency Provider Last Rate Last Dose  . betamethasone acetate-betamethasone sodium phosphate (CELESTONE) injection 12 mg  12 mg Intramuscular Once Edrick Kins, DPM      . triamcinolone acetonide (KENALOG) 10 MG/ML injection 5 mg  5 mg Intra-articular Once Wallene Huh, DPM        No Known Allergies  Review of Systems negative except from HPI and PMH  Physical Exam BP 120/72 (BP Location: Left Arm, Patient Position:  Sitting, Cuff Size: Large)   Pulse 68   Ht 5\' 11"  (1.803 m)   Wt 252 lb 8 oz (114.5 kg)   BMI 35.22 kg/m  \Well developed and nourished in no acute distress HENT normal Neck supple with JVP-flat Clear Regular rate and rhythm, no murmurs or gallops Abd-soft with active BS No Clubbing cyanosis edema Skin-warm and dry A & Oriented  Grossly normal sensory and motor function  ECG demonstrates sinus rhythm at 57 Intervals 19/09/40 Otherwise normal this was dated 04/07/13  Assessment and  Plan  PVCs  Atrial Fibrillation  Elevated blood pressure well controlled    Preoperative clearance   Cardiomyopathy--nonischemic  Continue BB;  we will begin him on losartan as an alternative to the ACE inhibitor.  We will reevaluate his LV function   He is scheduled to undergo colonoscopy because of guaiac positive  stool. His   Rivaroxaban  can be held for 48 hours prior. I reviewed with him that she be taken with his evening meal.   Gout has been an issue. We reviewed up today and presented for him their information   Weight loss is a major recommendation  Continue to restrict alcohol and caffeine  Blood pressure is well-controlled

## 2016-08-30 NOTE — Patient Instructions (Signed)
Medication Instructions: - Your physician has recommended you make the following change in your medication:  1) Start Losartan (cozaar) 25 mg- take one tablet by mouth once daily  Labwork: - Your physician recommends that you return for lab work in: 2 weeks- BMP  Procedures/Testing: - Your physician has requested that you have an echocardiogram. Echocardiography is a painless test that uses sound waves to create images of your heart. It provides your doctor with information about the size and shape of your heart and how well your heart's chambers and valves are working. This procedure takes approximately one hour. There are no restrictions for this procedure.  Follow-Up: - Your physician wants you to follow-up in: 1 year with Dr. Caryl Comes. You will receive a reminder letter in the mail two months in advance. If you don't receive a letter, please call our office to schedule the follow-up appointment.  Any Additional Special Instructions Will Be Listed Below (If Applicable).     If you need a refill on your cardiac medications before your next appointment, please call your pharmacy.

## 2016-08-31 ENCOUNTER — Ambulatory Visit (INDEPENDENT_AMBULATORY_CARE_PROVIDER_SITE_OTHER): Payer: BLUE CROSS/BLUE SHIELD | Admitting: Podiatry

## 2016-08-31 ENCOUNTER — Encounter: Payer: Self-pay | Admitting: Podiatry

## 2016-08-31 DIAGNOSIS — M109 Gout, unspecified: Secondary | ICD-10-CM

## 2016-08-31 DIAGNOSIS — M7751 Other enthesopathy of right foot: Secondary | ICD-10-CM

## 2016-08-31 MED ORDER — COLCHICINE 0.6 MG PO TABS: 0.6000 mg | ORAL_TABLET | Freq: Every day | ORAL | 0 refills | Status: DC

## 2016-09-09 MED ORDER — BETAMETHASONE SOD PHOS & ACET 6 (3-3) MG/ML IJ SUSP: 3.0000 mg | Freq: Once | INTRAMUSCULAR | Status: DC

## 2016-09-09 NOTE — Progress Notes (Signed)
Subjective:  Patient presents today for follow-up evaluation of the gout attack to the left ankle joint. Patient states that he's had approximately 3 gout attacks in the past year. Patient states that now he has pain in the first MPJ of the right foot. Patient has been diagnosed with gout through elevated uric acid levels. Patient presents today for further treatment and evaluation Patient states he's had an acute flareup in his right foot which started approximately 2-3 days ago. He believes she's had approximately 6 episodes since Christmas    Objective/Physical Exam General: The patient is alert and oriented x3 in no acute distress.  Dermatology: Skin is warm, dry and supple bilateral lower extremities. Negative for open lesions or macerations.  Vascular: Palpable pedal pulses bilaterally. No edema or erythema noted. Capillary refill within normal limits.  Neurological: Epicritic and protective threshold grossly intact bilaterally.   Musculoskeletal Exam: Pain on palpation and range of motion of the first MPJ right foot. Left ankle joint is negative for any pain on palpation. Range of motion within normal limits to all pedal and ankle joints bilateral. Muscle strength 5/5 in all groups bilateral.    Assessment: #1 acute gout attack first MPJ right foot #2 acute gout left ankle joint-resolved #3 pain in right foot   Plan of Care:  #1 Patient was evaluated. #2 injection of 0.5 mL Celestone Soluspan injected in the first MPJ right foot #3 prescription for colchicine #20 with instructions not to take greater than 5 days #4 patient is going to [place] for 1 week  #5 return to clinic when necessary   Edrick Kins, DPM [facility]  Dr. Edrick Kins, [place]                                        [place], [place] [address]                Office [phone]  Fax [phone]

## 2016-09-14 ENCOUNTER — Other Ambulatory Visit: Payer: BLUE CROSS/BLUE SHIELD

## 2016-09-19 ENCOUNTER — Other Ambulatory Visit: Payer: Self-pay | Admitting: Internal Medicine

## 2016-09-19 NOTE — Telephone Encounter (Signed)
S/w patient. He stated he took himself off the Losartan 2 weeks ago and started taking his Benazepril again. He said since this change he has not had anymore bouts of gout. He first started the Losartan on Jul 06, 2016 as instructed at the [place].  When he came to his appointment with Dr Caryl Comes on 08/30/16 and continued the Losartan. Patient wants a refill on Benazepril instead of continuing to take the Losartan.  Patient has a few more days of Benazepril left.  Patient said he had "coughing sensation" on the Benazepril but would rather put up with that than having episodes of gout.  Dr Caryl Comes, please advise to whether patient can have refill and switch back to Benazepril.

## 2016-09-19 NOTE — Telephone Encounter (Signed)
Patient would like a refill on Benazepril sent to [facility]. The patient was taken off the Benazepril and put on Losartan but the patient stopped the Losartan 2 weeks ago due to causing gout. Please advise what medication the patient needs to be on.

## 2016-09-24 ENCOUNTER — Ambulatory Visit (INDEPENDENT_AMBULATORY_CARE_PROVIDER_SITE_OTHER): Payer: BLUE CROSS/BLUE SHIELD

## 2016-09-24 ENCOUNTER — Other Ambulatory Visit: Payer: Self-pay

## 2016-09-24 DIAGNOSIS — I428 Other cardiomyopathies: Secondary | ICD-10-CM | POA: Diagnosis not present

## 2016-09-24 LAB — ECHOCARDIOGRAM COMPLETE
CHL CUP DOP CALC LVOT VTI: 15.4 cm
CHL CUP MV DEC (S): 195
CHL CUP RV SYS PRESS: 34 mmHg
CHL CUP STROKE VOLUME: 65 mL
E decel time: 195 msec
EERAT: 4.82
FS: 26 % — AB (ref 28–44)
IV/PV OW: 0.67
LA ID, A-P, ES: 34 mm
LA vol A4C: 69.6 ml
LADIAMINDEX: 1.4 cm/m2
LAVOL: 60.1 mL
LAVOLIN: 24.7 mL/m2
LDCA: 3.8 cm2
LEFT ATRIUM END SYS DIAM: 34 mm
LV E/e' medial: 4.82
LV E/e'average: 4.82
LV sys vol index: 19 mL/m2
LV sys vol: 47 mL (ref 21–61)
LVDIAVOL: 112 mL (ref 62–150)
LVDIAVOLIN: 46 mL/m2
LVELAT: 17.3 cm/s
LVOT SV: 59 mL
LVOTD: 22 mm
Lateral S' vel: 10.1 cm/s
MV pk E vel: 83.4 m/s
MVPG: 3 mmHg
MVPKAVEL: 63.5 m/s
PW: 9 mm — AB (ref 0.6–1.1)
Reg peak vel: 280 cm/s
Simpson's disk: 58
TDI e' lateral: 17.3
TDI e' medial: 8.86
TR max vel: 280 cm/s

## 2016-09-25 NOTE — Telephone Encounter (Signed)
Reviewed with Dr. Caryl Comes- he states that plain losartan is usually not a trigger for gout, but in looking this up, can be used as a treatment for gout. Dr. Caryl Comes questions if the patient was on losartan/hctz as the hctz can cause gout.  I have left a message for the patient to please call and clarify.

## 2016-09-28 ENCOUNTER — Telehealth: Payer: Self-pay | Admitting: Internal Medicine

## 2016-09-28 NOTE — Telephone Encounter (Signed)
Notes Recorded by Emily Filbert, RN on 09/28/2016 at 3:51 PM EST The patient is aware of his echo results.  The patient was previously on benazpril and this was causing a cough for him- he was switched to Losartan, but stopped this due to gout- Dr. Caryl Comes had reviewed side effects of losartan and gout was not a side effect- I had left a message for the patient to please call to verify if he was on losartan vs losartan/hctz. In speaking with the patient today, he could not recall if this was a combination pill.  He will call me back next week to verify.

## 2016-10-02 ENCOUNTER — Encounter: Payer: Self-pay | Admitting: Sports Medicine

## 2016-10-02 ENCOUNTER — Ambulatory Visit: Payer: Self-pay

## 2016-10-02 ENCOUNTER — Ambulatory Visit (INDEPENDENT_AMBULATORY_CARE_PROVIDER_SITE_OTHER): Payer: BLUE CROSS/BLUE SHIELD | Admitting: Sports Medicine

## 2016-10-02 VITALS — BP 130/88 | HR 80 | Ht 71.0 in | Wt 261.0 lb

## 2016-10-02 DIAGNOSIS — M25561 Pain in right knee: Secondary | ICD-10-CM

## 2016-10-02 NOTE — Progress Notes (Signed)
[name] - 51 y.o. male MRN [id number]  Date of birth: 1966-02-12  Office Visit Note: Visit Date: 10/02/2016 PCP: Viviana Simpler, MD Referred by: Venia Carbon, MD  Subjective: Chief Complaint  Patient presents with  . pain in right knee    Pt c/o pain in right knee that started this past Friday. He does not recall any injury to the knee. He did go running Thursday. The knee is tender to touch and worse in the morning. It is uncomfortable to put pressure on the right leg. He does feel throbbing in the knee currently.    HPI: Patient presents with an acute onset of right anterior medial knee pain.  He reports being out of town and walking a significant amount.  No known injury.  Denies any locking or giving way.  Does have a catching approximately 30 of knee flexion.  He has taken ibuprofen with only minimal improvement ROS:  Otherwise per HPI.  Objective:  VS:  HT:5\' 11"  (180.3 cm)   WT:261 lb (118.4 kg)  BMI:36.5    BP:130/88  HR:80bpm  TEMP: ( )  RESP:94 % Physical Exam: GENERAL:  WDWN, NAD, Non-toxic appearing PSYCH:  Alert & appropriately interactive  Not depressed or anxious appearing LOWER EXTREMITIES:  No significant skin changes overlying the legs.  No pretibial edema.  DP and PT pulses 2+/4.  Sensation intact to light touch. RIGHT KNEE:  Overall joint is well aligned, no significant deformity.    No significant effusion.    ROM: 0 to 120.  He does have a degree of mechanical clicking at 30 of flexion that causes acute pain.  No reproducible McMurray's.  Extensor mechanism intact  No significant medial or lateral joint line tenderness.  No pain with palpation of the medial and lateral patellar facets  Stable to varus/valgus strain & anterior/posterior drawer.  Normal Lachman's.    He is unable to perform excessively due to the discomfort with knee flexion.  Negative McMurray's with no mechanical click or pain.    Imaging &  Procedures: No results found. PROCEDURE NOTE: ULTRASOUND GUIDED RIGHT KNEEINJECTION  Images were obtained and interpreted by myself, Teresa Coombs, DO  Images have been saved and stored to PACS system. Images obtained on: GE S7 Ultrasound machine  DESCRIPTION OF PROCEDURE:  The patient's clinical condition is marked by substantial pain and/or significant functional disability. Other conservative therapy has not provided relief, is contraindicated, or not appropriate. There is a reasonable likelihood that injection will significantly improve the patient's pain and/or functional impairment. After discussing the risks, benefits and expected outcomes of the injection and all questions were reviewed and answered, the patient wished to undergo the above named procedure. Verbal consent was obtained. The ultrasound was used to identify the target structure and adjacent neurovascular structures. The skin was then prepped in sterile fashion and the target structure was injected under direct visualization using sterile technique as below: PREP: Alcohol, Ethel Chloride, 5cc 1% lidocaine on 25g needle APPROACH: Superior lateral, Sterile Exchange technique, 18 g 1.5" needle INJECTATE: 2cc 0.5% marcaine, 1cc 40mg  DepoMedrol ASPIRATE: None obtained  DRESSING: Band-Aid  Post procedural instructions including recommending icing and warning signs for infection were reviewed. This procedure was well tolerated and there were no complications.   IMPRESSION: Succesful US Guided Aspiration & Injection    Assessment & Plan: Problem List Items Addressed This Visit    Acute pain of right knee - Primary    Patient appears to have a  likely small osteochondral defect that is contributing to his symptoms.  He did have a small effusion and there is a question of potential on underlying gout that could be contributing to this however with the small effusion this seems less likely.  Aspiration attempted today however  fluid was not obtained, injection performed as above.  We will have him avoid any significant knee bending and will plan to follow-up with him if any lack of improvement.      Relevant Orders   [place] LIMITED JOINT SPACE STRUCTURES LOW RIGHT      Follow-up: Return if symptoms worsen or fail to improve.   Past Medical/Family/Surgical/Social History: Medications & Allergies reviewed per EMR Patient Active Problem List   Diagnosis Date Noted  . Acute pain of right knee 10/03/2016  . Cough, persistent 07/25/2016  . Acute bronchitis 06/26/2016  . Chronic low back pain with right-sided sciatica 12/23/2015  . Primary gout 11/10/2015  . Skin lesion 11/10/2015  . Preventative health care 06/22/2015  . Ventricular tachycardia, non-sustained//PVCs-Sx 11/27/2011  . Obesity 08/27/2011  . Long term current use of anticoagulant 10/24/2010  . GERD 06/13/2010  . Cardiomyopathy, nonischemic ([place]) 08/06/2009  . FATTY LIVER DISEASE 11/04/2008  . Hyperlipemia 08/04/2007  . Mood disorder ([place]) 08/04/2007  . GOUT 04/25/2007  . Essential hypertension 04/25/2007  . ATRIAL FIBRILLATION 04/25/2007   Past Medical History:  Diagnosis Date  . Atrial fibrillation ([place]) 1989  . Bicuspid aortic valve    Last echo looks trileaflet  . Cardiomyopathy 08/2009   Mildly decreased EF  . Depression   . GERD (gastroesophageal reflux disease)   . Gout   . Heat stroke    caused a seizure, occured when he was in the [employer]  . Hyperlipidemia   . Hypertension    Family History  Problem Relation Age of Onset  . Heart attack Father     MI  . Crohn's disease Brother   . Diabetes Mother   . Depression Mother   . Arthritis Mother   . Coronary artery disease Paternal Grandfather   . Coronary artery disease Paternal Uncle   . Hypertension Neg Hx   . Colon cancer Neg Hx    Past Surgical History:  Procedure Laterality Date  . CARDIAC CATHETERIZATION  03/2010   No sig coronary disease, EF ~40%  . CARDIOVERSION   1989  . TOTAL KNEE ARTHROPLASTY Left    Social History   Occupational History  . Sales    Social History Main Topics  . Smoking status: Never Smoker  . Smokeless tobacco: Never Used  . Alcohol use 0.0 oz/week     Comment: occassional  . Drug use: No  . Sexual activity: Not on file

## 2016-10-03 DIAGNOSIS — M25561 Pain in right knee: Secondary | ICD-10-CM | POA: Insufficient documentation

## 2016-10-03 NOTE — Assessment & Plan Note (Signed)
Patient appears to have a likely small osteochondral defect that is contributing to his symptoms.  He did have a small effusion and there is a question of potential on underlying gout that could be contributing to this however with the small effusion this seems less likely.  Aspiration attempted today however fluid was not obtained, injection performed as above.  We will have him avoid any significant knee bending and will plan to follow-up with him if any lack of improvement.

## 2016-10-05 ENCOUNTER — Ambulatory Visit: Payer: BLUE CROSS/BLUE SHIELD | Admitting: Internal Medicine

## 2016-10-07 ENCOUNTER — Other Ambulatory Visit: Payer: Self-pay | Admitting: Internal Medicine

## 2016-10-08 ENCOUNTER — Telehealth: Payer: Self-pay | Admitting: Internal Medicine

## 2016-10-08 NOTE — Telephone Encounter (Signed)
°   New message    pt verbalized that he is returning call to speak to rn

## 2016-10-10 NOTE — Telephone Encounter (Signed)
I called and spoke with the patient- he did not have his medication with him today- I called [facility] to verify what he is taking. Per the pharmacy, he was given plain losartan 25 mg once daily- he had called about 2 weeks ago stating that he had gout on this medication- he is currently back on benazepril with a minimal tickle in his throat.  Will review with Dr. Caryl Comes and call the patient back.

## 2016-10-11 NOTE — Telephone Encounter (Signed)
I am not familiar with gout on losartan with HCTZ   If he is ok on benazepril he can continue

## 2016-10-12 ENCOUNTER — Ambulatory Visit (INDEPENDENT_AMBULATORY_CARE_PROVIDER_SITE_OTHER): Payer: BLUE CROSS/BLUE SHIELD | Admitting: Sports Medicine

## 2016-10-12 ENCOUNTER — Encounter: Payer: Self-pay | Admitting: Sports Medicine

## 2016-10-12 VITALS — BP 110/72 | HR 67 | Ht 71.0 in | Wt 249.0 lb

## 2016-10-12 DIAGNOSIS — M25561 Pain in right knee: Secondary | ICD-10-CM | POA: Diagnosis not present

## 2016-10-12 DIAGNOSIS — M7652 Patellar tendinitis, left knee: Secondary | ICD-10-CM | POA: Diagnosis not present

## 2016-10-12 MED ORDER — BENAZEPRIL HCL 10 MG PO TABS: 10.0000 mg | ORAL_TABLET | Freq: Every day | ORAL | 6 refills | Status: DC

## 2016-10-12 MED ORDER — NITROGLYCERIN 0.2 MG/HR TD PT24: MEDICATED_PATCH | TRANSDERMAL | 1 refills | Status: DC

## 2016-10-12 NOTE — Telephone Encounter (Signed)
I left a message for the patient that he is ok to stay on benazepril 10 mg once daily as he is tolerating this ok.  RX refill sent to the pharmacy .

## 2016-10-12 NOTE — Patient Instructions (Addendum)
Please perform the exercise program that [name] has prepared for you and gone over in detail on a daily basis.  In addition to the handout you were provided you can access your program through: [email or web address]   Your unique program code is: [email or web address]   The procedures we discussed were PRP and Tenex Try using nitroglycerin patch as below. Please perform the exercises as outlined by [name] today.   Nitroglycerin Protocol   Apply 1/4 nitroglycerin patch to affected area daily.  Change position of patch within the affected area every 24 hours.  You may experience a headache during the first 1-2 weeks of using the patch, these should subside.  If you experience headaches after beginning nitroglycerin patch treatment, you may take your preferred over the counter pain reliever.  Another side effect of the nitroglycerin patch is skin irritation or rash related to patch adhesive.  Please notify our office if you develop more severe headaches or rash, and stop the patch.  Tendon healing with nitroglycerin patch may require 12 to 24 weeks depending on the extent of injury.  Men should not use if taking Viagra, Cialis, or Levitra.   Do not use if you have migraines or rosacea.

## 2016-10-12 NOTE — Addendum Note (Signed)
Addended by: Alvis Lemmings C on: 10/12/2016 03:43 PM   Modules accepted: Orders

## 2016-10-12 NOTE — Progress Notes (Signed)
[name] - 51 y.o. male MRN [id number]  Date of birth: Aug 26, 1965  Office Visit Note: Visit Date: 10/12/2016 PCP: Viviana Simpler, MD Referred by: Venia Carbon, MD  Subjective: Chief Complaint  Patient presents with  . pain in right knee    Pt c/o continued pain in right knee. He has been taking Ibuprofen daily until today. The knee is tender to palpation. He tried riding his bike and running, walking steps and felt very sore that night and the next day. He got some relief from the last steriod injection. Icing and using Ibuprofen does help. He has noticed some swelling.   HPI: Patient presents for reevaluation of right anterior knee pain.  He reported moderate improvement in his symptoms following intra-articular injection although incomplete relief.  Pain is worse with activities.  Denies any catching, locking or giving way.  Denies fevers, chills, recent weight gain or weight loss.  No night sweats. No significant nighttime awakenings due to this issue.    ROS: Otherwise per HPI.  Objective:  VS:  HT:5\' 11"  (180.3 cm)   WT:249 lb (112.9 kg)  BMI:34.8    BP:110/72  HR:67bpm  TEMP: ( )  RESP:96 % Physical Exam: GENERAL:  WDWN, NAD, Non-toxic appearing PSYCH:  Alert & appropriately interactive  Not depressed or anxious appearing LOWER EXTREMITIES:  No significant rashes/lesions/ulcerations overlying the legs.  No significant pretibial edema.  No clubbing or cyanosis.  DP & PT pulses 2+/4.  Sensation intact to light touch. Right KNEE:  Overall joint is well aligned, no significant deformity.    No significant effusion.    ROM: 0 to 120.    Extensor mechanism intact although moderately tender with resisted knee extension.  No significant medial or lateral joint line tenderness.  TTP over distal pole of the patella and tibial tubercle insertion.  Stable to varus/valgus strain & anterior/posterior drawer.  Normal Lachman's.    Negative McMurray's  and Thessaly.      Imaging & Procedures: No results found. LIMITED MSK ULTRASOUND OF RIGHT KNEE Images were obtained and interpreted by myself, Teresa Coombs, DO  Images have been saved and stored to PACS system. Images obtained on: GE S7 Ultrasound machine  FINDINGS:  Patella & Patellar Tendon: Hypoechoic change within the midportion of the proximal patellar tendon.  Some increased neovascularity but minimal.  No significant neovascularity within the Hoffa's fat pad although there is some prominence in this area. Quad & Quad Tendon: Normal Suprapatellar Pouch: Normal, no effusion Medial Joint Line: Slight extrusion of the medial meniscus without significant tearing. Lateral Joint Line: Normal lateral meniscus Trochlea: Normal appearing cartilage Posterior knee: n/a  IMPRESSION:  1. Moderate degree of patellar tendinopathy.   2. Slightly degenerative medial meniscus.   +++++++++++++++++++++++++++++++++++++++++++++++++++++++++++++++++++++++++++++ PROCEDURE NOTE: THERAPEUTIC EXERCISES (97110) 15 minutes spent for Therapeutic exercises as stated in above notes.  This included exercises focusing on stretching, strengthening, with significant focus on eccentric aspects.   Proper technique shown and discussed handout in great detail with ATC.  All questions were discussed and answered.    Assessment & Plan: Problem List Items Addressed This Visit    Acute pain of right knee - Primary   Patellar tendinitis of left knee    Nitroglycerin protocol discussed with patient today.  Can also consider PRP and/or Tenex patient would like to continue conservative management at this time as he reports that it is tolerable.  Concentric and eccentric exercises reviewed in detail with Kristine Royal, ATC  Relevant Medications   nitroGLYCERIN (NITRODUR - DOSED IN MG/24 HR) 0.2 mg/hr patch      Follow-up: Return in about 6 weeks (around 11/23/2016).   Past Medical/Family/Surgical/Social  History: Medications & Allergies reviewed per EMR Patient Active Problem List   Diagnosis Date Noted  . Patellar tendinitis of left knee 10/12/2016  . Acute pain of right knee 10/03/2016  . Cough, persistent 07/25/2016  . Acute bronchitis 06/26/2016  . Chronic low back pain with right-sided sciatica 12/23/2015  . Primary gout 11/10/2015  . Skin lesion 11/10/2015  . Preventative health care 06/22/2015  . Ventricular tachycardia, non-sustained//PVCs-Sx 11/27/2011  . Obesity 08/27/2011  . Long term current use of anticoagulant 10/24/2010  . GERD 06/13/2010  . Cardiomyopathy, nonischemic ([place]) 08/06/2009  . FATTY LIVER DISEASE 11/04/2008  . Hyperlipemia 08/04/2007  . Mood disorder ([place]) 08/04/2007  . GOUT 04/25/2007  . Essential hypertension 04/25/2007  . ATRIAL FIBRILLATION 04/25/2007   Past Medical History:  Diagnosis Date  . Atrial fibrillation ([place]) 1989  . Bicuspid aortic valve    Last echo looks trileaflet  . Cardiomyopathy 08/2009   Mildly decreased EF  . Depression   . GERD (gastroesophageal reflux disease)   . Gout   . Heat stroke    caused a seizure, occured when he was in the [employer]  . Hyperlipidemia   . Hypertension    Family History  Problem Relation Age of Onset  . Heart attack Father     MI  . Crohn's disease Brother   . Diabetes Mother   . Depression Mother   . Arthritis Mother   . Coronary artery disease Paternal Grandfather   . Coronary artery disease Paternal Uncle   . Hypertension Neg Hx   . Colon cancer Neg Hx    Past Surgical History:  Procedure Laterality Date  . CARDIAC CATHETERIZATION  03/2010   No sig coronary disease, EF ~40%  . CARDIOVERSION  1989  . TOTAL KNEE ARTHROPLASTY Left    Social History   Occupational History  . Sales    Social History Main Topics  . Smoking status: Never Smoker  . Smokeless tobacco: Never Used  . Alcohol use 0.0 oz/week     Comment: occassional  . Drug use: No  . Sexual activity: Not on file

## 2016-10-16 NOTE — Assessment & Plan Note (Signed)
Nitroglycerin protocol discussed with patient today.  Can also consider PRP and/or Tenex patient would like to continue conservative management at this time as he reports that it is tolerable.  Concentric and eccentric exercises reviewed in detail with Kristine Royal, ATC

## 2016-10-19 ENCOUNTER — Telehealth: Payer: Self-pay | Admitting: Sports Medicine

## 2016-10-19 NOTE — Telephone Encounter (Signed)
Patient saw Paulla Fore 10-12-16 and has questions about medication that [facility] prescribed. Please call patient back to advise. Okay to leave detailed message on phone.   nitroGLYCERIN (NITRODUR - DOSED IN MG/24 HR) 0.2 mg/hr patch Does patient cut into sections or place entire patch on?

## 2016-10-19 NOTE — Telephone Encounter (Signed)
Pt is aware to cut the patch in quarters and apply to the affected area.

## 2016-10-19 NOTE — Telephone Encounter (Signed)
Left message for patient to call back  

## 2016-10-26 ENCOUNTER — Telehealth: Payer: Self-pay | Admitting: Internal Medicine

## 2016-10-26 NOTE — Telephone Encounter (Signed)
Left voicemail message to call back  

## 2016-10-26 NOTE — Telephone Encounter (Signed)
Pt calling stating he is to have a colonoscopy done  He has not schedule it yet but since he is a heart patient  He was asked to call us and make sure he doesn't need to stop taking anything or do anything special   Please advise.

## 2016-10-29 ENCOUNTER — Telehealth: Payer: Self-pay | Admitting: Gastroenterology

## 2016-10-29 NOTE — Telephone Encounter (Signed)
Glad he called.  I see that his last cardiac echo showed an improved EF of 40-45 %, so he is OK for a procedure in the [place].  Please arrange a nurse visit in [place], [place], no Xarelto 2 days prior to scope.

## 2016-10-29 NOTE — Telephone Encounter (Signed)
s/w pt who is scheduled for colonoscopy. We have not yet received clearance information. Reviewed Dr. Olin Pia January 2018 OV notes: "He is scheduled to undergo colonoscopy because of guaiac positive stool. His   Rivaroxaban  can be held for 48 hours prior. I reviewed with him that she be taken with his evening meal. " Pt verbalized understanding of xarelto instructions and confirms he takes it in the evening.

## 2016-10-29 NOTE — Telephone Encounter (Signed)
Pt returning our call °Please call back ° °

## 2016-10-29 NOTE — Telephone Encounter (Signed)
We were waiting for his follow up echo results before we scheduled. Would you like to proceed with a colonoscopy?

## 2016-10-30 ENCOUNTER — Other Ambulatory Visit: Payer: Self-pay

## 2016-10-30 ENCOUNTER — Telehealth: Payer: Self-pay

## 2016-10-30 NOTE — Telephone Encounter (Signed)
Pre-visit has been scheduled for 11-19-2016 and colonoscopy on 12-13-2016.

## 2016-10-30 NOTE — Telephone Encounter (Signed)
MRN: [id number]   Dear Dr.Klein,    We have scheduled the above patient for an endoscopic procedure (colonoscopy). Our records show that he is on anticoagulation therapy.   Please advise as to how long the patient may come off his therapy of Xarelto prior to the procedure.   Please fax back/ or route the completed form to Christell Constant at [phone].    Sincerely,   Christell Constant

## 2016-11-03 NOTE — Telephone Encounter (Signed)
Can hold 2 doses prior to procedure

## 2016-11-05 NOTE — Telephone Encounter (Signed)
Pt has been notified and aware to hold Eliquis 2 days prior to the colonoscopy.

## 2016-11-12 ENCOUNTER — Encounter: Payer: Self-pay | Admitting: Podiatry

## 2016-11-12 ENCOUNTER — Ambulatory Visit (INDEPENDENT_AMBULATORY_CARE_PROVIDER_SITE_OTHER): Payer: BLUE CROSS/BLUE SHIELD

## 2016-11-12 ENCOUNTER — Ambulatory Visit (INDEPENDENT_AMBULATORY_CARE_PROVIDER_SITE_OTHER): Payer: BLUE CROSS/BLUE SHIELD | Admitting: Podiatry

## 2016-11-12 DIAGNOSIS — M109 Gout, unspecified: Secondary | ICD-10-CM | POA: Diagnosis not present

## 2016-11-12 DIAGNOSIS — M7752 Other enthesopathy of left foot: Secondary | ICD-10-CM | POA: Diagnosis not present

## 2016-11-12 DIAGNOSIS — M25572 Pain in left ankle and joints of left foot: Secondary | ICD-10-CM

## 2016-11-12 DIAGNOSIS — M659 Synovitis and tenosynovitis, unspecified: Secondary | ICD-10-CM

## 2016-11-12 MED ORDER — COLCHICINE 0.6 MG PO TABS: 0.6000 mg | ORAL_TABLET | Freq: Every day | ORAL | 0 refills | Status: DC

## 2016-11-12 NOTE — Progress Notes (Signed)
Subjective:  Patient presents today for follow-up evaluation of the gout attack to the left ankle joint. Patient has been diagnosed with gout through elevated uric acid levels. He reports pain and swelling to the medial aspect of the left ankle onset two days ago. He states Apple cider vinegar and taking his colchicine help to alleviate his pain. Patient presents today for further treatment and evaluation   Objective/Physical Exam General: The patient is alert and oriented x3 in no acute distress.  Dermatology: Skin is warm, dry and supple bilateral lower extremities. Negative for open lesions or macerations.  Vascular: Palpable pedal pulses bilaterally. No edema or erythema noted. Capillary refill within normal limits.  Neurological: Epicritic and protective threshold grossly intact bilaterally.   Musculoskeletal Exam: Pain with palpation to left ankle joint. Range of motion within normal limits to all pedal and ankle joints bilateral. Muscle strength 5/5 in all groups bilateral.   Assessment: #1 acute gout left ankle joint #2 pain in left ankle   Plan of Care:  #1 Patient was evaluated. #2 injection of 0.5 mL Celestone Soluspan injected in the left ankle. #3 prescription for colchicine #20 with instructions not to take greater than 5 days #4 return to clinic when necessary   Edrick Kins, DPM [facility]  Dr. Edrick Kins, [address]                                        [place], [place] [address]                Office [phone]  Fax [phone]

## 2016-11-14 MED ORDER — BETAMETHASONE SOD PHOS & ACET 6 (3-3) MG/ML IJ SUSP: 3.0000 mg | Freq: Once | INTRAMUSCULAR | Status: DC

## 2016-11-19 ENCOUNTER — Ambulatory Visit (AMBULATORY_SURGERY_CENTER): Payer: Self-pay | Admitting: *Deleted

## 2016-11-19 VITALS — Ht 70.0 in | Wt 243.0 lb

## 2016-11-19 DIAGNOSIS — Z1211 Encounter for screening for malignant neoplasm of colon: Secondary | ICD-10-CM

## 2016-11-19 MED ORDER — NA SULFATE-K SULFATE-MG SULF 17.5-3.13-1.6 GM/177ML PO SOLN: 1.0000 | Freq: Once | ORAL | 0 refills | Status: AC

## 2016-11-19 NOTE — Progress Notes (Signed)
No egg or soy allergy known to patient  No issues with past sedation with any surgeries  or procedures, no intubation problems  No diet pills per patient No home 02 use per patient  No blood thinners per patient  Pt denies issues with constipation  Hx of  A fib on takes xarelto daily - okay to hold xarelto for 2 days per Dr Caryl Comes cardio  emmi video to e mail $15 coupon to pt for suprep

## 2016-12-01 ENCOUNTER — Other Ambulatory Visit: Payer: Self-pay | Admitting: Internal Medicine

## 2016-12-03 ENCOUNTER — Encounter: Payer: Self-pay | Admitting: Gastroenterology

## 2016-12-03 NOTE — Telephone Encounter (Signed)
Request received for Xarelto 20mg . Pt is 51 yrs old, Wt-112.9kg, Crea-1.07 on 06/26/16, CrCl-131.43ml/min last seen by Dr. Caryl Comes on 08/30/16. Will send in requested refill.

## 2016-12-13 ENCOUNTER — Encounter: Payer: Self-pay | Admitting: Gastroenterology

## 2016-12-13 ENCOUNTER — Ambulatory Visit (AMBULATORY_SURGERY_CENTER): Payer: BLUE CROSS/BLUE SHIELD | Admitting: Gastroenterology

## 2016-12-13 VITALS — BP 102/56 | HR 60 | Temp 99.3°F | Resp 11 | Ht 71.0 in | Wt 249.0 lb

## 2016-12-13 DIAGNOSIS — Z1211 Encounter for screening for malignant neoplasm of colon: Secondary | ICD-10-CM | POA: Diagnosis present

## 2016-12-13 DIAGNOSIS — D120 Benign neoplasm of cecum: Secondary | ICD-10-CM | POA: Diagnosis not present

## 2016-12-13 DIAGNOSIS — D128 Benign neoplasm of rectum: Secondary | ICD-10-CM

## 2016-12-13 DIAGNOSIS — Z1212 Encounter for screening for malignant neoplasm of rectum: Secondary | ICD-10-CM | POA: Diagnosis not present

## 2016-12-13 MED ORDER — SODIUM CHLORIDE 0.9 % IV SOLN: 500.0000 mL | INTRAVENOUS | Status: DC

## 2016-12-13 NOTE — Progress Notes (Signed)
Report given to PACU, vss 

## 2016-12-13 NOTE — Progress Notes (Signed)
Called to room to assist during endoscopic procedure.  Patient ID and intended procedure confirmed with present staff. Received instructions for my participation in the procedure from the performing physician.  

## 2016-12-13 NOTE — Progress Notes (Signed)
No changes in medical or surgical hx since PV 

## 2016-12-13 NOTE — Op Note (Signed)
[facility] Patient Name: [name] Procedure Date: 12/13/2016 1:12 PM MRN: [id number] Endoscopist: Mallie Mussel L. Loletha Carrow , MD Age: 51 Referring MD:  Date of Birth: 1965/11/29 Gender: Male Account #: [id number] Procedure:                Colonoscopy Indications:              Screening for colorectal malignant neoplasm, This                            is the patient's first colonoscopy Medicines:                Monitored Anesthesia Care Procedure:                Pre-Anesthesia Assessment:                           - Prior to the procedure, a History and Physical                            was performed, and patient medications and                            allergies were reviewed. The patient's tolerance of                            previous anesthesia was also reviewed. The risks                            and benefits of the procedure and the sedation                            options and risks were discussed with the patient.                            All questions were answered, and informed consent                            was obtained. Prior Anticoagulants: The patient has                            taken Xarelto (rivaroxaban), last dose was 3 days                            prior to procedure. ASA Grade Assessment: III - A                            patient with severe systemic disease. After                            reviewing the risks and benefits, the patient was                            deemed in satisfactory condition to undergo the  procedure.                           After obtaining informed consent, the colonoscope                            was passed under direct vision. Throughout the                            procedure, the patient's blood pressure, pulse, and                            oxygen saturations were monitored continuously. The                            Colonoscope was introduced through the anus and                  advanced to the the cecum, identified by                            appendiceal orifice and ileocecal valve. The                            colonoscopy was performed without difficulty. The                            patient tolerated the procedure well. The quality                            of the bowel preparation was excellent. The                            ileocecal valve, appendiceal orifice, and rectum                            were photographed. The quality of the bowel                            preparation was evaluated using the BBPS [facility]                            Bowel Preparation Scale) with scores of: Right                            Colon = 3, Transverse Colon = 3 and Left Colon = 3                            (entire mucosa seen well with no residual staining,                            small fragments of stool or opaque liquid). The                            total BBPS score  equals 9. The bowel preparation                            used was SUPREP. Scope In: 1:19:41 PM Scope Out: 1:39:13 PM Scope Withdrawal Time: 0 hours 16 minutes 14 seconds  Total Procedure Duration: 0 hours 19 minutes 32 seconds  Findings:                 The perianal and digital rectal examinations were                            normal.                           Two sessile polyps were found in the rectum and                            cecum. The polyps were 2 mm in size. These polyps                            were removed with a piecemeal technique using a                            cold biopsy forceps. Resection and retrieval were                            complete.                           The exam was otherwise without abnormality on                            direct and retroflexion views. Complications:            No immediate complications. Estimated Blood Loss:     Estimated blood loss was minimal. Impression:               - Two 2 mm polyps in the rectum and in the  cecum,                            removed piecemeal using a cold biopsy forceps.                            Resected and retrieved.                           - The examination was otherwise normal on direct                            and retroflexion views. Recommendation:           - Patient has a contact number available for                            emergencies. The signs and symptoms of potential  delayed complications were discussed with the                            patient. Return to normal activities tomorrow.                            Written discharge instructions were provided to the                            patient.                           - Resume previous diet.                           - Resume Xarelto (rivaroxaban) at prior dose today.                           - Await pathology results.                           - Repeat colonoscopy is recommended for                            surveillance. The colonoscopy date will be                            determined after pathology results from today's                            exam become available for review. Henry L. Loletha Carrow, MD 12/13/2016 1:43:53 PM This report has been signed electronically.

## 2016-12-13 NOTE — Patient Instructions (Signed)
Impression/Recommendations:  Polyp handout given to patient.  Resume Xarelto at prior dose today.  Repeat colonoscopy recommended for surveillance.  Date to be determined after pathology results reviewed.  YOU HAD AN ENDOSCOPIC PROCEDURE TODAY AT [facility]:   Refer to the procedure report that was given to you for any specific questions about what was found during the examination.  If the procedure report does not answer your questions, please call your gastroenterologist to clarify.  If you requested that your care partner not be given the details of your procedure findings, then the procedure report has been included in a sealed envelope for you to review at your convenience later.  YOU SHOULD EXPECT: Some feelings of bloating in the abdomen. Passage of more gas than usual.  Walking can help get rid of the air that was put into your GI tract during the procedure and reduce the bloating. If you had a lower endoscopy (such as a colonoscopy or flexible sigmoidoscopy) you may notice spotting of blood in your stool or on the toilet paper. If you underwent a bowel prep for your procedure, you may not have a normal bowel movement for a few days.  Please Note:  You might notice some irritation and congestion in your nose or some drainage.  This is from the oxygen used during your procedure.  There is no need for concern and it should clear up in a day or so.  SYMPTOMS TO REPORT IMMEDIATELY:   Following lower endoscopy (colonoscopy or flexible sigmoidoscopy):  Excessive amounts of blood in the stool  Significant tenderness or worsening of abdominal pains  Swelling of the abdomen that is new, acute  Fever of 100F or higher For urgent or emergent issues, a gastroenterologist can be reached at any hour by calling [phone].   DIET:  We do recommend a small meal at first, but then you may proceed to your regular diet.  Drink plenty of fluids but you should avoid alcoholic  beverages for 24 hours.  ACTIVITY:  You should plan to take it easy for the rest of today and you should NOT DRIVE or use heavy machinery until tomorrow (because of the sedation medicines used during the test).    FOLLOW UP: Our staff will call the number listed on your records the next business day following your procedure to check on you and address any questions or concerns that you may have regarding the information given to you following your procedure. If we do not reach you, we will leave a message.  However, if you are feeling well and you are not experiencing any problems, there is no need to return our call.  We will assume that you have returned to your regular daily activities without incident.  If any biopsies were taken you will be contacted by phone or by letter within the next 1-3 weeks.  Please call us at [phone] if you have not heard about the biopsies in 3 weeks.    SIGNATURES/CONFIDENTIALITY: You and/or your care partner have signed paperwork which will be entered into your electronic medical record.  These signatures attest to the fact that that the information above on your After Visit Summary has been reviewed and is understood.  Full responsibility of the confidentiality of this discharge information lies with you and/or your care-partner.

## 2016-12-14 ENCOUNTER — Telehealth: Payer: Self-pay | Admitting: *Deleted

## 2016-12-14 NOTE — Telephone Encounter (Signed)
  Follow up Call-  Call back number 12/13/2016  Post procedure Call Back phone  # [phone]  Permission to leave phone message Yes  Some recent data might be hidden     Patient questions:  Do you have a fever, pain , or abdominal swelling? No. Pain Score  0 *  Have you tolerated food without any problems? Yes.    Have you been able to return to your normal activities? Yes.    Do you have any questions about your discharge instructions: Diet   No. Medications  No. Follow up visit  No.  Do you have questions or concerns about your Care? No.  Actions: * If pain score is 4 or above: No action needed, pain <4.

## 2016-12-19 ENCOUNTER — Encounter: Payer: Self-pay | Admitting: Gastroenterology

## 2017-02-19 ENCOUNTER — Ambulatory Visit: Payer: BLUE CROSS/BLUE SHIELD | Attending: Pain Medicine | Admitting: Physical Therapy

## 2017-02-19 DIAGNOSIS — M545 Low back pain, unspecified: Secondary | ICD-10-CM

## 2017-02-19 DIAGNOSIS — G8929 Other chronic pain: Secondary | ICD-10-CM | POA: Diagnosis present

## 2017-02-19 DIAGNOSIS — M2560 Stiffness of unspecified joint, not elsewhere classified: Secondary | ICD-10-CM | POA: Diagnosis present

## 2017-02-19 DIAGNOSIS — R293 Abnormal posture: Secondary | ICD-10-CM

## 2017-02-20 NOTE — Therapy (Signed)
[facility] [address] [place], [place], [address] Phone: [phone]   Fax:  [phone]  Physical Therapy Evaluation  Patient Details  Name: Mikkel Charrette MRN: [id number] Date of Birth: 09-24-1965 Referring Provider: Dr. Danelle Berry  Encounter Date: 02/19/2017      PT End of Session - 02/20/17 1719    Visit Number 1   Number of Visits 1   Date for PT Re-Evaluation 02/20/17   PT Start Time 5643   PT Stop Time 1442   PT Time Calculation (min) 107 min   Activity Tolerance Patient limited by pain   Behavior During Therapy [facility] for tasks assessed/performed      Past Medical History:  Diagnosis Date  . Anxiety   . Arthritis    gout  . Atrial fibrillation ([place]) 1989  . Bicuspid aortic valve    Last echo looks trileaflet  . Cardiomyopathy 08/2009   Mildly decreased EF  . Depression   . GERD (gastroesophageal reflux disease)   . Gout   . 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 Hyperlipidemia   . Hypertension   . Seizures ([place]) 1998   with heat stroke in the [employer] -none since    Past Surgical History:  Procedure Laterality Date  . CARDIAC CATHETERIZATION  03/2010   No sig coronary disease, EF ~40%  . CARDIOVERSION  1989  . TOTAL KNEE ARTHROPLASTY Left     There were no vitals filed for this visit.       Subjective Assessment - 02/20/17 1712    Subjective See FCE report.     Limitations Sitting;Lifting;Standing;Walking;House hold activities   Patient Stated Goals Complete FCE report   Currently in Pain? Yes   Pain Score 5    Pain Location Back   Pain Orientation Right;Lower   Pain Descriptors / Indicators Aching;Tightness;Tender   Pain Onset More than a month ago   Pain Frequency Constant            [facility] PT Assessment - 02/20/17 0001      Assessment   Medical Diagnosis Chronic Low Back Pain   Referring Provider Dr. Danelle Berry   Onset  Date/Surgical Date 08/06/88   Hand Dominance Right     Precautions   Precautions None     Balance Screen   Has the patient fallen in the past 6 months No     Prior Function   Level of Independence Independent             Plan - 02/20/17 1722    Clinical Impression Statement See FCE report (scanned into patient instruction section).     Clinical Presentation Stable   Clinical Decision Making Low   Rehab Potential Fair   PT Frequency 1x / week   PT Treatment/Interventions Functional mobility training;Therapeutic activities;Therapeutic exercise;Balance training;Patient/family education;Neuromuscular re-education   PT Next Visit Plan 1x visit (FCE only).        Patient will benefit from skilled therapeutic intervention in order to improve the following deficits and impairments:  Pain, Improper body mechanics, Postural dysfunction, Decreased activity tolerance, Decreased endurance, Decreased range of motion, Decreased strength, Hypomobility, Impaired flexibility  Visit Diagnosis: Chronic right-sided low back pain without sciatica  Joint stiffness  Abnormal posture     Problem List Patient Active Problem List   Diagnosis Date Noted  . Patellar tendinitis of left knee 10/12/2016  .  Acute pain of right knee 10/03/2016  . Cough, persistent 07/25/2016  . Acute bronchitis 06/26/2016  . Chronic low back pain with right-sided sciatica 12/23/2015  . Primary gout 11/10/2015  . Skin lesion 11/10/2015  . Preventative health care 06/22/2015  . Ventricular tachycardia, non-sustained//PVCs-Sx 11/27/2011  . Obesity 08/27/2011  . Long term current use of anticoagulant 10/24/2010  . GERD 06/13/2010  . Cardiomyopathy, nonischemic ([place]) 08/06/2009  . FATTY LIVER DISEASE 11/04/2008  . Hyperlipemia 08/04/2007  . Mood disorder ([place]) 08/04/2007  . GOUT 04/25/2007  . Essential hypertension 04/25/2007  . ATRIAL FIBRILLATION 04/25/2007   Pura Spice, PT, DPT # [phone] 02/20/2017,  5:25 PM  [facility] [address] [place], [place], [address] Phone: [phone]   Fax:  [phone]  Name: Sidi Dzikowski MRN: [id number] Date of Birth: [birth year]

## 2017-03-05 ENCOUNTER — Ambulatory Visit (INDEPENDENT_AMBULATORY_CARE_PROVIDER_SITE_OTHER): Payer: BLUE CROSS/BLUE SHIELD | Admitting: Internal Medicine

## 2017-03-05 ENCOUNTER — Encounter: Payer: Self-pay | Admitting: Internal Medicine

## 2017-03-05 VITALS — BP 108/66 | HR 66 | Temp 97.8°F | Wt 247.0 lb

## 2017-03-05 DIAGNOSIS — H6982 Other specified disorders of Eustachian tube, left ear: Secondary | ICD-10-CM

## 2017-03-05 NOTE — Patient Instructions (Signed)
Barotitis Media Barotitis media is inflammation of the middle ear. This condition occurs when an auditory tube (eustachian tube) is blocked in one or both ears. These tubes lead from the middle ear to the back of the nose (nasopharynx). This condition typically occurs when you experience changes in pressure, such as when flying or scuba diving. Untreated barotitis media may lead to damage or hearing loss (barotrauma), which may become permanent. What are the causes? This condition may be caused by changes in air pressure from:  Flying.  Scuba diving.  A nearby explosion. What increases the risk? The following factors may make you more likely to develop this condition:  Middle ear infection.  Sinus infection.  A cold.  Environmental allergies.  Small eustachian tubes.  Recent ear surgery. What are the signs or symptoms? Symptoms of this condition may include:  Ear pain.  Hearing loss. In severe cases, symptoms can include:  Dizziness and nausea (vertigo).  Temporary facial paralysis. How is this diagnosed? This condition is diagnosed based on:  A physical exam. Your health care provider may:  Use a device (otoscope) to look into your ear canal and check your eardrum.  Do a test that changes air pressure in the middle ear to check how well the eardrum moves and to see if the eustachian tube is working(tympanogram).  Your medical history. In some cases, your health care provider may have you take a hearing test. You may also be referred to someone who specializes in ear treatment (otolaryngologist, "ENT"). How is this treated? This condition may be treated with:  Medicines to relieve congestion in your nose, sinus, or upper respiratory tract (decongestants).  Techniques to equalize pressure (to "pop" your ears), such as:  Yawning.  Chewing gum.  Swallowing. In severe cases, you may need surgery to relieve your symptoms or to prevent future inflammation. Follow  these instructions at home:  Take over-the-counter and prescription medicines only as told by your health care provider.  Do not put anything into your ears to clean or unplug them. Ear drops will not help.  Keep all follow-up visits as told by your health care provider. This is important. How is this prevented? Using these strategies may help to prevent barotitis media:  Chewing gum with frequent, forceful swallowing during takeoff and landing when flying.  Holding your nose and gently blowing to pop your ears for equalizing pressure changes. This forces air into the eustachian tube.  Yawning during air pressure changes.  Using a nasal decongestant about 30-60 minutes before flying, if you have nasal congestion. Contact a health care provider if:  You have vertigo.  You have hearing loss.  Your symptoms do not get better or they get worse.  You have a fever. Get help right away if:  You have a severe headache, ear pain, and dizziness.  You have balance problems.  You cannot move or feel part of your face.  You have bloody or pus-like drainage from your ears. Summary  Barotitis media is inflammation of the middle ear.  This condition typically occurs when you experience changes in pressure, such as when flying or scuba diving.  You may be at a higher risk for this condition if you have small eustachian tubes, had recent ear surgery, or have allergies, a cold, or sinus or middle ear infection.  This condition may be treated with medicines or techniques to equalize pressure in your ears.  Strategies can be used to help prevent barotitis media. This information is   not intended to replace advice given to you by your health care provider. Make sure you discuss any questions you have with your health care provider. Document Released: 07/20/2000 Document Revised: 06/11/2016 Document Reviewed: 06/11/2016 Elsevier Interactive Patient Education  2017 Elsevier Inc.  

## 2017-03-05 NOTE — Progress Notes (Signed)
Subjective:    Patient ID: [name], male    DOB: [birth year], 51 y.o.   MRN: [id number]  HPI  Pt presents to the clinic today with c/o left ear pain. He reports this started 2 weeks ago. He notices the pain only when he swallows. He denies decreased hearing. He denies runny nose, nasal congestion, sore throat of cough. He has not tried anything OTC for this.   Review of Systems      Past Medical History:  Diagnosis Date  . Anxiety   . Arthritis    gout  . Atrial fibrillation ([place]) 1989  . Bicuspid aortic valve    Last echo looks trileaflet  . Cardiomyopathy 08/2009   Mildly decreased EF  . Depression   . GERD (gastroesophageal reflux disease)   . Gout   . 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 Hyperlipidemia   . Hypertension   . Seizures ([place]) 1998   with heat stroke in the [employer] -none since    Current Outpatient Prescriptions  Medication Sig Dispense Refill  . allopurinol (ZYLOPRIM) 300 MG tablet Take 1 tablet (300 mg total) by mouth daily. 90 tablet 3  . atorvastatin (LIPITOR) 20 MG tablet take 1 tablet by mouth once daily 90 tablet 2  . benazepril (LOTENSIN) 10 MG tablet Take 1 tablet (10 mg total) by mouth daily. 30 tablet 6  . carvedilol (COREG) 12.5 MG tablet take 1 tablet by mouth twice a day with food 60 tablet 0  . colchicine 0.6 MG tablet Take 1 tablet (0.6 mg total) by mouth daily. 30 tablet 0  . desoximetasone (TOPICORT) 0.25 % cream Apply 1 application topically 2 (two) times daily as needed.    Marland Kitchen FLUoxetine (PROZAC) 20 MG capsule take 3 capsules by mouth once daily 270 capsule 3  . Ibuprofen 200 MG CAPS Take by mouth.    . loratadine (CLARITIN) 10 MG tablet Take 10 mg by mouth daily as needed for allergies.    . Multiple Vitamin (MULTIVITAMIN) tablet Take 1 tablet by mouth daily.    . nitroGLYCERIN (NITRODUR - DOSED IN MG/24 HR) 0.2 mg/hr patch Place 1/4 of patch over affected region.  Remove and replace once daily.  Slightly alter skin placement daily 30 patch 1  . vitamin C (ASCORBIC ACID) 500 MG tablet Take by mouth.    Alveda Reasons 20 MG TABS tablet take 1 tablet by mouth once daily 30 tablet 5   Current Facility-Administered Medications  Medication Dose Route Frequency Provider Last Rate Last Dose  . 0.9 %  sodium chloride infusion  500 mL Intravenous Continuous Nelida Meuse III, MD      . betamethasone acetate-betamethasone sodium phosphate (CELESTONE) injection 12 mg  12 mg Intramuscular Once Daylene Katayama M, DPM      . betamethasone acetate-betamethasone sodium phosphate (CELESTONE) injection 3 mg  3 mg Intramuscular Once Daylene Katayama M, DPM      . betamethasone acetate-betamethasone sodium phosphate (CELESTONE) injection 3 mg  3 mg Intramuscular Once Edrick Kins, DPM        No Known Allergies  Family History  Problem Relation Age of Onset  . Heart attack Father        MI  . Crohn's disease Brother   . Diabetes Mother   . Depression Mother   . Arthritis Mother   . Coronary artery disease Paternal Grandfather   .  Coronary artery disease Paternal Uncle   . Hypertension Neg Hx   . Colon cancer Neg Hx   . Colon polyps Neg Hx   . Esophageal cancer Neg Hx   . Rectal cancer Neg Hx   . Stomach cancer Neg Hx     Social History   Social History  . Marital status: Married    Spouse name: N/A  . Number of children: 1  . Years of education: N/A   Occupational History  . Sales    Social History Main Topics  . Smoking status: Never Smoker  . Smokeless tobacco: Never Used  . Alcohol use 0.0 oz/week     Comment: occassional  . Drug use: No  . Sexual activity: Not on file   Other Topics Concern  . Not on file   Social History Narrative   Married with 1 son     Constitutional: Denies fever, malaise, fatigue, headache or abrupt weight changes.  HEENT: Pt reports ear pain. Denies eye pain, eye redness, ringing in the ears, wax buildup, runny nose,  nasal congestion, bloody nose, or sore throat.   No other specific complaints in a complete review of systems (except as listed in HPI above).  Objective:   Physical Exam   BP 108/66   Pulse 66   Temp 97.8 F (36.6 C) (Oral)   Wt 247 lb (112 kg)   SpO2 97%   BMI 34.45 kg/m  Wt Readings from Last 3 Encounters:  03/05/17 247 lb (112 kg)  12/13/16 249 lb (112.9 kg)  11/19/16 243 lb (110.2 kg)    General: Appears his stated age, well developed, well nourished in NAD. HEENT: Left Ear: Tm's gray and intact, normal light reflex, + serous effusion;  Neck:  No adenopathy noted.  BMET    Component Value Date/Time   NA 139 06/26/2016 1027   NA 139 04/16/2013 1222   NA 138 02/20/2013 0539   K 4.5 06/26/2016 1027   K 3.9 02/20/2013 0539   CL 100 06/26/2016 1027   CL 104 02/20/2013 0539   CO2 32 06/26/2016 1027   CO2 28 02/20/2013 0539   GLUCOSE 92 06/26/2016 1027   GLUCOSE 100 (H) 02/20/2013 0539   BUN 18 06/26/2016 1027   BUN 18 04/16/2013 1222   BUN 23 (H) 02/20/2013 0539   CREATININE 1.07 06/26/2016 1027   CREATININE 1.20 02/20/2013 0539   CALCIUM 9.8 06/26/2016 1027   CALCIUM 8.9 02/20/2013 0539   GFRNONAA >90 05/06/2014 1315   GFRNONAA >60 02/20/2013 0539   GFRAA >90 05/06/2014 1315   GFRAA >60 02/20/2013 0539    Lipid Panel     Component Value Date/Time   CHOL 214 (H) 06/26/2016 1027   TRIG (H) 06/26/2016 1027    408.0 Triglyceride is over 400; calculations on Lipids are invalid.   HDL 50.00 06/26/2016 1027   CHOLHDL 4 06/26/2016 1027   VLDL 73.4 (H) 06/14/2014 1146   LDLCALC 111 (H) 10/31/2012 1038    CBC    Component Value Date/Time   WBC 7.0 06/26/2016 1027   RBC 4.34 06/26/2016 1027   HGB 13.2 06/26/2016 1027   HGB 13.1 02/20/2013 0539   HCT 38.7 (L) 06/26/2016 1027   HCT 38.2 (L) 02/20/2013 0539   PLT 272.0 06/26/2016 1027   PLT 209 02/20/2013 0539   MCV 89.2 06/26/2016 1027   MCV 90 02/20/2013 0539   MCH 31.0 07/27/2014 0913   MCH 30.6  02/20/2013 0539   MCHC 34.1  06/26/2016 1027   RDW 13.3 06/26/2016 1027   RDW 13.5 07/27/2014 0913   RDW 13.0 02/20/2013 0539   LYMPHSABS 1.8 06/26/2016 1027   LYMPHSABS 1.7 07/27/2014 0913   MONOABS 1.0 06/26/2016 1027   EOSABS 0.0 06/26/2016 1027   EOSABS 0.2 07/27/2014 0913   BASOSABS 0.0 06/26/2016 1027   BASOSABS 0.0 07/27/2014 0913    Hgb A1C Lab Results  Component Value Date   HGBA1C 5.5 08/27/2011           Assessment & Plan:   ETD, Left:  Start Flonase BID x 1 week If no improvement, can try oral steroids  Return precautions discussed Webb Silversmith, NP

## 2017-04-10 ENCOUNTER — Encounter: Payer: Self-pay | Admitting: Internal Medicine

## 2017-04-10 ENCOUNTER — Ambulatory Visit (INDEPENDENT_AMBULATORY_CARE_PROVIDER_SITE_OTHER): Payer: BLUE CROSS/BLUE SHIELD | Admitting: Internal Medicine

## 2017-04-10 VITALS — BP 110/70 | HR 58 | Temp 97.7°F | Wt 252.0 lb

## 2017-04-10 DIAGNOSIS — H9202 Otalgia, left ear: Secondary | ICD-10-CM

## 2017-04-10 MED ORDER — PREDNISONE 20 MG PO TABS: 40.0000 mg | ORAL_TABLET | Freq: Every day | ORAL | 0 refills | Status: DC

## 2017-04-10 NOTE — Progress Notes (Signed)
Subjective:    Patient ID: [name], male    DOB: [birth year], 51 y.o.   MRN: [id number]  HPI Here due to continued ear pain  Left ear pain goes back about 2 months Reviewed note by Rollene Fare NP Used the nasal steroids--- not clearly better Really flared this weekend after swimming in a pool Now somewhat better--but still painful last night Can feel it down neck when swallowing  Has had air flight--mid August Ears popped--no clear problems In mountains then also  No other Rx for this--has taken ibuprofen for other reasons (so didn't note difference with this)  Current Outpatient Prescriptions on File Prior to Visit  Medication Sig Dispense Refill  . allopurinol (ZYLOPRIM) 300 MG tablet Take 1 tablet (300 mg total) by mouth daily. 90 tablet 3  . atorvastatin (LIPITOR) 20 MG tablet take 1 tablet by mouth once daily 90 tablet 2  . benazepril (LOTENSIN) 10 MG tablet Take 1 tablet (10 mg total) by mouth daily. 30 tablet 6  . carvedilol (COREG) 12.5 MG tablet take 1 tablet by mouth twice a day with food 60 tablet 0  . colchicine 0.6 MG tablet Take 1 tablet (0.6 mg total) by mouth daily. 30 tablet 0  . desoximetasone (TOPICORT) 0.25 % cream Apply 1 application topically 2 (two) times daily as needed.    Marland Kitchen FLUoxetine (PROZAC) 20 MG capsule take 3 capsules by mouth once daily 270 capsule 3  . Ibuprofen 200 MG CAPS Take by mouth.    . loratadine (CLARITIN) 10 MG tablet Take 10 mg by mouth daily as needed for allergies.    . Multiple Vitamin (MULTIVITAMIN) tablet Take 1 tablet by mouth daily.    . nitroGLYCERIN (NITRODUR - DOSED IN MG/24 HR) 0.2 mg/hr patch Place 1/4 of patch over affected region. Remove and replace once daily.  Slightly alter skin placement daily 30 patch 1  . vitamin C (ASCORBIC ACID) 500 MG tablet Take by mouth.    Alveda Reasons 20 MG TABS tablet take 1 tablet by mouth once daily 30 tablet 5   No current facility-administered medications on file prior to visit.      No Known Allergies  Past Medical History:  Diagnosis Date  . Anxiety   . Arthritis    gout  . Atrial fibrillation ([place]) 1989  . Bicuspid aortic valve    Last echo looks trileaflet  . Cardiomyopathy 08/2009   Mildly decreased EF  . Depression   . GERD (gastroesophageal reflux disease)   . Gout   . 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 Hyperlipidemia   . Hypertension   . Seizures ([place]) 1998   with heat stroke in the [employer] -none since    Past Surgical History:  Procedure Laterality Date  . CARDIAC CATHETERIZATION  03/2010   No sig coronary disease, EF ~40%  . CARDIOVERSION  1989  . TOTAL KNEE ARTHROPLASTY Left     Family History  Problem Relation Age of Onset  . Heart attack Father        MI  . Crohn's disease Brother   . Diabetes Mother   . Depression Mother   . Arthritis Mother   . Coronary artery disease Paternal Grandfather   . Coronary artery disease Paternal Uncle   . Hypertension Neg Hx   . Colon cancer Neg Hx   . Colon polyps Neg Hx   . Esophageal cancer  Neg Hx   . Rectal cancer Neg Hx   . Stomach cancer Neg Hx     Social History   Social History  . Marital status: Married    Spouse name: N/A  . Number of children: 1  . Years of education: N/A   Occupational History  . Sales    Social History Main Topics  . Smoking status: Never Smoker  . Smokeless tobacco: Never Used  . Alcohol use 0.0 oz/week     Comment: occassional  . Drug use: No  . Sexual activity: Not on file   Other Topics Concern  . Not on file   Social History Narrative   Married with 1 son   Review of Systems  No fever Slight sneezing--but no cough, SOB or other URI symptoms     Objective:   Physical Exam  Constitutional: He appears well-developed. No distress.  HENT:  No sinus tenderness Mild pale nasal swelling TMs and canals look normal  Neck: Neck supple.  Lymphadenopathy:    He has no cervical  adenopathy.          Assessment & Plan:

## 2017-04-10 NOTE — Patient Instructions (Signed)
Please let me know next week if you are still having the ear pain--I will set you up with an ENT specialist

## 2017-04-10 NOTE — Assessment & Plan Note (Signed)
History still suggests eustachian tube dysfunction Will try 3 days of prednisone If doesn't help by next week, will set up with [facility]

## 2017-04-15 ENCOUNTER — Encounter: Payer: Self-pay | Admitting: Internal Medicine

## 2017-04-15 ENCOUNTER — Telehealth: Payer: Self-pay | Admitting: Internal Medicine

## 2017-04-15 DIAGNOSIS — H9202 Otalgia, left ear: Secondary | ICD-10-CM

## 2017-04-15 NOTE — Telephone Encounter (Signed)
Let him know I made referral and he should hear soon about it (by tomorrow I would expect)

## 2017-04-15 NOTE — Telephone Encounter (Signed)
Caller Name:[name]  Relationship to Patient:self Best number:[phone] Pharmacy:  Reason for call: pt is requesting referral to ent in [place]

## 2017-04-16 NOTE — Telephone Encounter (Signed)
Lm on pts vm and advised orders placed for referral. Pt advised to await a call with appt details

## 2017-04-25 ENCOUNTER — Other Ambulatory Visit: Payer: Self-pay | Admitting: Otolaryngology

## 2017-04-25 DIAGNOSIS — R221 Localized swelling, mass and lump, neck: Secondary | ICD-10-CM

## 2017-04-25 DIAGNOSIS — H9202 Otalgia, left ear: Secondary | ICD-10-CM | POA: Diagnosis not present

## 2017-04-25 DIAGNOSIS — J351 Hypertrophy of tonsils: Secondary | ICD-10-CM | POA: Diagnosis not present

## 2017-05-05 ENCOUNTER — Other Ambulatory Visit: Payer: Self-pay | Admitting: Internal Medicine

## 2017-05-07 ENCOUNTER — Telehealth: Payer: Self-pay | Admitting: Internal Medicine

## 2017-05-07 ENCOUNTER — Ambulatory Visit
Admission: RE | Admit: 2017-05-07 | Discharge: 2017-05-07 | Disposition: A | Discharge status: Home / Self Care | Payer: BLUE CROSS/BLUE SHIELD | Source: Ambulatory Visit | Attending: Otolaryngology | Admitting: Otolaryngology

## 2017-05-07 DIAGNOSIS — R221 Localized swelling, mass and lump, neck: Secondary | ICD-10-CM | POA: Insufficient documentation

## 2017-05-07 DIAGNOSIS — C098 Malignant neoplasm of overlapping sites of tonsil: Secondary | ICD-10-CM | POA: Diagnosis not present

## 2017-05-07 DIAGNOSIS — J341 Cyst and mucocele of nose and nasal sinus: Secondary | ICD-10-CM | POA: Diagnosis not present

## 2017-05-07 DIAGNOSIS — C099 Malignant neoplasm of tonsil, unspecified: Secondary | ICD-10-CM | POA: Diagnosis not present

## 2017-05-07 DIAGNOSIS — I7090 Unspecified atherosclerosis: Secondary | ICD-10-CM | POA: Diagnosis not present

## 2017-05-07 MED ORDER — IOPAMIDOL (ISOVUE-300) INJECTION 61%
75.0000 mL | Freq: Once | INTRAVENOUS | Status: AC | PRN
  Administered 2017-05-07: 75 mL via INTRAVENOUS

## 2017-05-07 NOTE — Telephone Encounter (Signed)
Patient last seen by Dr. Caryl Comes in January. With procedure being done under general anesthesia, the patient will need to have an office visit with Dr. Caryl Comes, a PA/ NP prior to surgery.   Will call the office back tomorrow to notify them and will ask scheduling to touch base with the patient to arrange follow up.

## 2017-05-07 NOTE — Telephone Encounter (Signed)
New message    [name] is calling to find out what Dr. Caryl Comes thinks of surgery. [facility].     [facility] Pre-operative Risk Assessment    Request for surgical clearance:  1. What type of surgery is being performed? Biopsy of L tonsil    2. When is this surgery scheduled? Not scheduled   3. Are there any medications that need to be held prior to surgery and how long? xarelto   4. Name of physician performing surgery? Dr. Ronny Flurry Vaught   5. What is your office phone and fax number? Fax-[phone]   6. Anesthesia type (None, local, MAC, general) ? General    [name] 05/07/2017, 3:41 PM  _________________________________________________________________   (provider comments below)

## 2017-05-08 ENCOUNTER — Telehealth: Payer: Self-pay | Admitting: Internal Medicine

## 2017-05-08 NOTE — Telephone Encounter (Signed)
I called and spoke with [name] at [facility]- she states the procedure type has changed to a core biopsy under ultrasound in radiology- this will be done under local anesthesia so they just need to know about his blood thinner.   Will forward to pharmacy to review.  Patient currently taking xarelto- for a-fib.

## 2017-05-08 NOTE — Telephone Encounter (Signed)
Follow up    [facility] calling back, states they are no longer doing left tonsil biopsy. They are now going to do core biopsy under ultrasound. Wants to know how long to hold blood thinner. Please call.

## 2017-05-08 NOTE — Telephone Encounter (Signed)
Pt takes Xarelto for afib with CHADS2VASc of 1 (HTN). Normal renal function. Ok to hold Xarelto for 2 days prior. Clearance faxed to [phone].

## 2017-05-08 NOTE — Telephone Encounter (Signed)
New message  Pt call to f/u on surgery clearance from ENT office. Please call back to discuss

## 2017-05-08 NOTE — Telephone Encounter (Signed)
I called and spoke with the patient. I advised him that when the ENT office called yesterday they were going to do a biopsy in the OR under general anesthesia, however, we had received a call today that the biopsy had changed to one that will be done under local anesthesia in radiology. I advised him that it was my understanding they only needed clearance for the xarelto to be held - I informed him that pharmacy had ok'ed him to hold this for 2 days prior.  I also advised him that pending what they find on his biopsy, if he needs to have surgery under general anesthesia, we would then need to see him for clearance as it has been over 30 days since he was seen.  He inquired if we should go ahead and schedule this. I advised him to find out his biopsy date and then we can schedule follow up after that. He verbalizes understanding.

## 2017-05-09 ENCOUNTER — Other Ambulatory Visit: Payer: Self-pay

## 2017-05-09 ENCOUNTER — Inpatient Hospital Stay: Payer: BLUE CROSS/BLUE SHIELD

## 2017-05-09 ENCOUNTER — Telehealth: Payer: Self-pay | Admitting: Internal Medicine

## 2017-05-09 ENCOUNTER — Encounter: Payer: Self-pay | Admitting: Oncology

## 2017-05-09 ENCOUNTER — Inpatient Hospital Stay: Payer: BLUE CROSS/BLUE SHIELD | Attending: Oncology | Admitting: Oncology

## 2017-05-09 VITALS — BP 106/66 | HR 69 | Temp 97.4°F | Resp 16 | Ht 70.25 in | Wt 244.6 lb

## 2017-05-09 DIAGNOSIS — Z79899 Other long term (current) drug therapy: Secondary | ICD-10-CM | POA: Insufficient documentation

## 2017-05-09 DIAGNOSIS — Z7901 Long term (current) use of anticoagulants: Secondary | ICD-10-CM | POA: Diagnosis not present

## 2017-05-09 DIAGNOSIS — J351 Hypertrophy of tonsils: Secondary | ICD-10-CM | POA: Diagnosis not present

## 2017-05-09 DIAGNOSIS — C770 Secondary and unspecified malignant neoplasm of lymph nodes of head, face and neck: Secondary | ICD-10-CM | POA: Diagnosis not present

## 2017-05-09 DIAGNOSIS — R591 Generalized enlarged lymph nodes: Secondary | ICD-10-CM

## 2017-05-09 DIAGNOSIS — M1990 Unspecified osteoarthritis, unspecified site: Secondary | ICD-10-CM

## 2017-05-09 DIAGNOSIS — C099 Malignant neoplasm of tonsil, unspecified: Secondary | ICD-10-CM | POA: Diagnosis not present

## 2017-05-09 DIAGNOSIS — I4891 Unspecified atrial fibrillation: Secondary | ICD-10-CM | POA: Diagnosis not present

## 2017-05-09 DIAGNOSIS — E785 Hyperlipidemia, unspecified: Secondary | ICD-10-CM

## 2017-05-09 DIAGNOSIS — R590 Localized enlarged lymph nodes: Secondary | ICD-10-CM | POA: Diagnosis not present

## 2017-05-09 DIAGNOSIS — R220 Localized swelling, mass and lump, head: Principal | ICD-10-CM

## 2017-05-09 DIAGNOSIS — K219 Gastro-esophageal reflux disease without esophagitis: Secondary | ICD-10-CM | POA: Diagnosis not present

## 2017-05-09 DIAGNOSIS — I10 Essential (primary) hypertension: Secondary | ICD-10-CM

## 2017-05-09 DIAGNOSIS — J358 Other chronic diseases of tonsils and adenoids: Secondary | ICD-10-CM

## 2017-05-09 DIAGNOSIS — Z800 Family history of malignant neoplasm of digestive organs: Secondary | ICD-10-CM | POA: Diagnosis not present

## 2017-05-09 DIAGNOSIS — R530 Neoplastic (malignant) related fatigue: Secondary | ICD-10-CM | POA: Insufficient documentation

## 2017-05-09 LAB — CBC WITH DIFFERENTIAL/PLATELET
BASOS ABS: 0 10*3/uL (ref 0–0.1)
BASOS PCT: 1 %
EOS ABS: 0 10*3/uL (ref 0–0.7)
Eosinophils Relative: 1 %
HEMATOCRIT: 38 % — AB (ref 40.0–52.0)
HEMOGLOBIN: 13.3 g/dL (ref 13.0–18.0)
Lymphocytes Relative: 24 %
Lymphs Abs: 1.4 10*3/uL (ref 1.0–3.6)
MCH: 31.1 pg (ref 26.0–34.0)
MCHC: 35 g/dL (ref 32.0–36.0)
MCV: 88.7 fL (ref 80.0–100.0)
MONOS PCT: 12 %
Monocytes Absolute: 0.7 10*3/uL (ref 0.2–1.0)
NEUTROS ABS: 3.5 10*3/uL (ref 1.4–6.5)
NEUTROS PCT: 62 %
Platelets: 263 10*3/uL (ref 150–440)
RBC: 4.28 MIL/uL — ABNORMAL LOW (ref 4.40–5.90)
RDW: 13 % (ref 11.5–14.5)
WBC: 5.6 10*3/uL (ref 3.8–10.6)

## 2017-05-09 LAB — COMPREHENSIVE METABOLIC PANEL
ALBUMIN: 4.5 g/dL (ref 3.5–5.0)
ALT: 34 U/L (ref 17–63)
ANION GAP: 9 (ref 5–15)
AST: 34 U/L (ref 15–41)
Alkaline Phosphatase: 66 U/L (ref 38–126)
BUN: 18 mg/dL (ref 6–20)
CALCIUM: 9.7 mg/dL (ref 8.9–10.3)
CO2: 28 mmol/L (ref 22–32)
Chloride: 99 mmol/L — ABNORMAL LOW (ref 101–111)
Creatinine, Ser: 1.02 mg/dL (ref 0.61–1.24)
GFR calc non Af Amer: >60 mL/min (ref >60)
Glucose, Bld: 108 mg/dL — ABNORMAL HIGH (ref 65–99)
Potassium: 4.3 mmol/L (ref 3.5–5.1)
SODIUM: 136 mmol/L (ref 135–145)
TOTAL PROTEIN: 8.3 g/dL — AB (ref 6.5–8.1)
Total Bilirubin: 1.3 mg/dL — ABNORMAL HIGH (ref 0.3–1.2)

## 2017-05-09 LAB — BILIRUBIN, DIRECT

## 2017-05-09 LAB — TSH: TSH: 4.066 u[IU]/mL (ref 0.350–4.500)

## 2017-05-09 LAB — LACTATE DEHYDROGENASE: LDH: 132 U/L (ref 98–192)

## 2017-05-09 NOTE — Progress Notes (Signed)
Patient here today as a new patient  

## 2017-05-09 NOTE — Progress Notes (Signed)
Hematology/Oncology Consult note [facility] [phone] [phone]  CONSULT NOTE Patient Care Team: Venia Carbon, MD as PCP - General  REFERRING PROVIDER: Dr.Vaught CHIEF COMPLAINTS/PURPOSE OF CONSULTATION:  Tonsil mass  HISTORY OF PRESENTING ILLNESS:   51 y.o.  male with PMH listed below who was referred by to me for evaluation of newly discovered tonsil mass. Patient presented with left ear pain, left neck swelling, associated with discomfort with swallowing for the past several months, and the throat discomfort has been getting worse lately. Denies fever night sweat or weight loss. He reports feeling fatigue, this afternoon, for the first time, he has to take a nap to recover from fatigue.  He was seen and evaluated by ENT Dr.Vaught. On ENT examination, he has  Tonsil hypertrophy, Level II and III left neck lymphadenopathy  ROS:  Review of Systems  Constitutional: Positive for fatigue.  HENT:   Positive for lump/mass and sore throat.   Eyes: Negative.   Respiratory: Negative.   Cardiovascular: Negative.   Gastrointestinal: Negative.   Endocrine: Negative.   Genitourinary: Negative.    Musculoskeletal: Negative.   Skin: Negative.     MEDICAL HISTORY:  Past Medical History:  Diagnosis Date  . Anxiety   . Arthritis    gout  . Atrial fibrillation ([place]) 1989  . Bicuspid aortic valve    Last echo looks trileaflet  . Cardiomyopathy 08/2009   Mildly decreased EF  . Depression   . GERD (gastroesophageal reflux disease)   . Gout   . 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 Hyperlipidemia   . Hypertension   . Seizures ([place]) 1998   with heat stroke in the [employer] -none since    SURGICAL HISTORY: Past Surgical History:  Procedure Laterality Date  . CARDIAC CATHETERIZATION  03/2010   No sig coronary disease, EF ~40%  . CARDIOVERSION  1989  . TOTAL KNEE ARTHROPLASTY  Left     SOCIAL HISTORY: Social History   Social History  . Marital status: Married    Spouse name: N/A  . Number of children: 1  . Years of education: N/A   Occupational History  . Sales    Social History Main Topics  . Smoking status: Never Smoker  . Smokeless tobacco: Never Used  . Alcohol use 1.2 - 1.8 oz/week    2 - 3 Cans of beer per week     Comment: occassional  . Drug use: No  . Sexual activity: Not on file   Other Topics Concern  . Not on file   Social History Narrative   Married with 1 son    FAMILY HISTORY: Family History  Problem Relation Age of Onset  . Heart attack Father        MI  . Crohn's disease Brother   . Diabetes Mother   . Depression Mother   . Arthritis Mother   . COPD Mother   . Coronary artery disease Paternal Grandfather   . Pancreatic cancer Paternal Uncle   . Hypertension Neg Hx   . Colon cancer Neg Hx   . Colon polyps Neg Hx   . Esophageal cancer Neg Hx   . Rectal cancer Neg Hx   . Stomach cancer Neg Hx     ALLERGIES:  has No Known Allergies.  MEDICATIONS:  Current Outpatient Prescriptions  Medication Sig Dispense Refill  . allopurinol (ZYLOPRIM) 300 MG tablet Take  1 tablet (300 mg total) by mouth daily. 90 tablet 3  . atorvastatin (LIPITOR) 20 MG tablet take 1 tablet by mouth once daily 90 tablet 2  . benazepril (LOTENSIN) 10 MG tablet take 1 tablet by mouth once daily 30 tablet 3  . carvedilol (COREG) 12.5 MG tablet take 1 tablet by mouth twice a day with food 60 tablet 0  . colchicine 0.6 MG tablet Take 1 tablet (0.6 mg total) by mouth daily. 30 tablet 0  . desoximetasone (TOPICORT) 0.25 % cream Apply 1 application topically 2 (two) times daily as needed.    Marland Kitchen FLUoxetine (PROZAC) 20 MG capsule take 3 capsules by mouth once daily 270 capsule 3  . Multiple Vitamin (MULTIVITAMIN) tablet Take 1 tablet by mouth daily.    . nitroGLYCERIN (NITRODUR - DOSED IN MG/24 HR) 0.2 mg/hr patch Place 1/4 of patch over affected region.  Remove and replace once daily.  Slightly alter skin placement daily 30 patch 1  . vitamin C (ASCORBIC ACID) 500 MG tablet Take by mouth.    Alveda Reasons 20 MG TABS tablet take 1 tablet by mouth once daily 30 tablet 5  . HYDROcodone-acetaminophen (NORCO/VICODIN) 5-325 MG tablet take 1 to 2 tablets by mouth every 4 hours if needed  0  . Ibuprofen 200 MG CAPS Take by mouth.     No current facility-administered medications for this visit.       Marland Kitchen  PHYSICAL EXAMINATION: ECOG PERFORMANCE STATUS: 1 - Symptomatic but completely ambulatory Vitals:   05/09/17 1459  BP: 106/66  Pulse: 69  Resp: 16  Temp: (!) 97.4 F (36.3 C)   Filed Weights   05/09/17 1459  Weight: 244 lb 9 oz (110.9 kg)    GENERAL:Alert, no distress and comfortable.  EYES: no pallor or icterus OROPHARYNX: no thrush or ulceration;  NECK: supple, no masses felt LYMPH: palpable cervical lymphadenopathy on the left side. No palpable node right side.  LUNGS: clear to auscultation and  No wheeze or crackles HEART/CVS: regular rate & rhythm and no murmurs; No lower extremity edema ABDOMEN: abdomen soft, non-tender and normal bowel sounds Musculoskeletal:no cyanosis of digits and no clubbing  PSYCH: alert & oriented x 3  NEURO: no focal motor/sensory deficits SKIN:  no rashes or significant lesions  LABORATORY DATA:  I have reviewed the data as listed Lab Results  Component Value Date   WBC 7.0 06/26/2016   HGB 13.2 06/26/2016   HCT 38.7 (L) 06/26/2016   MCV 89.2 06/26/2016   PLT 272.0 06/26/2016    Recent Labs  06/26/16 1027  NA 139  K 4.5  CL 100  CO2 32  GLUCOSE 92  BUN 18  CREATININE 1.07  CALCIUM 9.8  PROT 7.7  ALBUMIN 4.5  AST 41*  ALT 47  ALKPHOS 60  BILITOT 1.0    RADIOGRAPHIC STUDIES: I have personally reviewed the radiological images as listed and agreed with the findings in the report. 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 The primary mass is 3.3 cm and predominantly submucosal. Three left upper jugular chain malignant nodes the largest measuring 3.3 cm.  ASSESSMENT & PLAN:  1. Tonsillar mass   2. Head and neck lymphadenopathy   3. Neoplastic malignant related fatigue    Image results were discussed with patient in details. Clinically T2N2 disease, pending biopsy and P16 positivity for staging.  PET scan,  Per patient, Cardiologist is ok with  transient interruption of Xalreto for upcoming biopsy procedure.  Further management plan pending staging and biopsy results. 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 Patient already has appointment with RT.  Check cbc, cmp, LDH.  All questions were answered. The patient knows to call the clinic with any problems questions or concerns.  Return of visit: a few days after biopsy, tentatively 2 weeks.  Total face to face encounter time for this patient visit was 45 min. >50% of the time was  spent in counseling and coordination of care.  Thank you for this kind referral and the opportunity to participate in the care of this patient. A copy of today's note is routed to referring provider    Earlie Server, MD, PhD Hematology Oncology [facility] Pager- [phone] 05/09/2017

## 2017-05-09 NOTE — Telephone Encounter (Signed)
Gerringer, Caryl Pina  P Cv Div [place] Triage        Patient has a throat mass and needs clearance asap to have procedure. Per Dr. Pryor Ochoa waiting on response x 2 days. Please call Cell asap [phone]

## 2017-05-09 NOTE — Telephone Encounter (Signed)
F/u message  [name] from [facility] call requesting to speak with RN about getting the clearance signed by Dr. Caryl Comes. She states the office needs to have the Doctor sign off for pt to hold Xarelto. Please call back if needed.

## 2017-05-09 NOTE — Telephone Encounter (Signed)
Triage,   Can you see if one of the doctors there will sign off on the blood thinner recommendations from [place] that were sent over to [facility] since Dr. Caryl Comes is gone until 05/20/17.  See 05/07/17 phone note.   Thanks!

## 2017-05-09 NOTE — Telephone Encounter (Signed)
Routing to Dr Rockey Situ for advice.  Pharmacist advised : "Supple, Harlon Flor, RPH      10:36 AM  Note    Pt takes Xarelto for afib with CHADS2VASc of 1 (HTN). Normal renal function. Ok to hold Xarelto for 2 days prior. Clearance faxed to [phone]."

## 2017-05-09 NOTE — Telephone Encounter (Signed)
Chart reviewed, including office notes, EKGs Case discussed with Dr. Pryor Ochoa Acceptable risk to stop anticoagulation/xarelto for biopsy Would hold 2 days prior to procedure  Would restart anticoagulation only when approved by interventional radiology Would be acceptable risk to hold anticoagulation for 24 hours or more after the procedure  If surgery is required in the future, Would also be acceptable risk to hold anticoagulation for 2 days prior to surgery   Signed, Esmond Plants, MD, Ph.D [facility]

## 2017-05-10 ENCOUNTER — Telehealth: Payer: Self-pay | Admitting: *Deleted

## 2017-05-10 ENCOUNTER — Other Ambulatory Visit: Payer: Self-pay | Admitting: Otolaryngology

## 2017-05-10 DIAGNOSIS — I10 Essential (primary) hypertension: Secondary | ICD-10-CM | POA: Diagnosis not present

## 2017-05-10 DIAGNOSIS — R221 Localized swelling, mass and lump, neck: Secondary | ICD-10-CM

## 2017-05-10 DIAGNOSIS — M109 Gout, unspecified: Secondary | ICD-10-CM | POA: Diagnosis not present

## 2017-05-10 DIAGNOSIS — Z7289 Other problems related to lifestyle: Secondary | ICD-10-CM | POA: Diagnosis not present

## 2017-05-10 DIAGNOSIS — E785 Hyperlipidemia, unspecified: Secondary | ICD-10-CM | POA: Diagnosis not present

## 2017-05-10 NOTE — Telephone Encounter (Signed)
-----   Message from Ace Gins sent at 05/09/2017  4:23 PM EDT ----- Regarding: Urgent Clearance request from Dr. Meade Maw ENT  Patient has a throat mass and needs clearance asap to have procedure.  Per Dr. Pryor Ochoa waiting on response x 2 days.  Please call Cell asap [phone]

## 2017-05-10 NOTE — Telephone Encounter (Signed)
Clearance routed over to [facility]

## 2017-05-13 ENCOUNTER — Encounter: Payer: Self-pay | Admitting: Oncology

## 2017-05-13 ENCOUNTER — Ambulatory Visit
Admission: RE | Admit: 2017-05-13 | Discharge: 2017-05-13 | Disposition: A | Discharge status: Home / Self Care | Payer: BLUE CROSS/BLUE SHIELD | Source: Ambulatory Visit | Attending: Otolaryngology | Admitting: Otolaryngology

## 2017-05-13 DIAGNOSIS — C099 Malignant neoplasm of tonsil, unspecified: Secondary | ICD-10-CM | POA: Diagnosis not present

## 2017-05-13 DIAGNOSIS — R221 Localized swelling, mass and lump, neck: Secondary | ICD-10-CM

## 2017-05-13 DIAGNOSIS — I429 Cardiomyopathy, unspecified: Secondary | ICD-10-CM | POA: Diagnosis not present

## 2017-05-13 DIAGNOSIS — K219 Gastro-esophageal reflux disease without esophagitis: Secondary | ICD-10-CM | POA: Diagnosis not present

## 2017-05-13 DIAGNOSIS — Q231 Congenital insufficiency of aortic valve: Secondary | ICD-10-CM | POA: Diagnosis not present

## 2017-05-13 DIAGNOSIS — I4891 Unspecified atrial fibrillation: Secondary | ICD-10-CM | POA: Insufficient documentation

## 2017-05-13 DIAGNOSIS — I10 Essential (primary) hypertension: Secondary | ICD-10-CM | POA: Insufficient documentation

## 2017-05-13 DIAGNOSIS — Z79899 Other long term (current) drug therapy: Secondary | ICD-10-CM | POA: Diagnosis not present

## 2017-05-13 DIAGNOSIS — E785 Hyperlipidemia, unspecified: Secondary | ICD-10-CM | POA: Diagnosis not present

## 2017-05-13 DIAGNOSIS — Z7901 Long term (current) use of anticoagulants: Secondary | ICD-10-CM | POA: Insufficient documentation

## 2017-05-13 DIAGNOSIS — F419 Anxiety disorder, unspecified: Secondary | ICD-10-CM | POA: Diagnosis not present

## 2017-05-13 DIAGNOSIS — R590 Localized enlarged lymph nodes: Secondary | ICD-10-CM | POA: Diagnosis not present

## 2017-05-13 DIAGNOSIS — Z96652 Presence of left artificial knee joint: Secondary | ICD-10-CM | POA: Diagnosis not present

## 2017-05-13 DIAGNOSIS — C770 Secondary and unspecified malignant neoplasm of lymph nodes of head, face and neck: Secondary | ICD-10-CM | POA: Diagnosis not present

## 2017-05-13 DIAGNOSIS — F329 Major depressive disorder, single episode, unspecified: Secondary | ICD-10-CM | POA: Insufficient documentation

## 2017-05-13 LAB — COMP PANEL: LEUKEMIA/LYMPHOMA

## 2017-05-13 LAB — PROTIME-INR
INR: 1.05
Prothrombin Time: 13.6 seconds (ref 11.4–15.2)

## 2017-05-13 MED ORDER — FENTANYL CITRATE (PF) 100 MCG/2ML IJ SOLN
INTRAMUSCULAR | Status: AC
  Filled 2017-05-13: qty 4

## 2017-05-13 MED ORDER — MIDAZOLAM HCL 2 MG/2ML IJ SOLN
INTRAMUSCULAR | Status: AC | PRN
  Administered 2017-05-13 (×2): 1 mg via INTRAVENOUS

## 2017-05-13 MED ORDER — FENTANYL CITRATE (PF) 100 MCG/2ML IJ SOLN
INTRAMUSCULAR | Status: AC | PRN
  Administered 2017-05-13 (×2): 50 ug via INTRAVENOUS

## 2017-05-13 MED ORDER — SODIUM CHLORIDE 0.9 % IV SOLN: INTRAVENOUS | Status: DC

## 2017-05-13 MED ORDER — MIDAZOLAM HCL 2 MG/2ML IJ SOLN
INTRAMUSCULAR | Status: AC
  Filled 2017-05-13: qty 4

## 2017-05-13 NOTE — Procedures (Signed)
Interventional Radiology Procedure Note  Procedure: US guided FNA and core biopsy of left cervical lymph node   Complications: None  Estimated Blood Loss: < 10 mL  25 G FNA x 2, 20 G FNA x 1, 18 G core biopsy x 3 of enlarged left level II cervical lymph node under US guidance.  Venetia Night. Kathlene Cote, M.D Pager:  [phone]

## 2017-05-13 NOTE — H&P (Signed)
Chief Complaint: Patient was seen in consultation today for left cervical lymph node biopsy at the request of Vaught,Creighton  Referring Physician(s): Vaught,Creighton  Patient Status: [place] - Out-pt  History of Present Illness: [name] is a 51 y.o. male with a left tonsillar mass and enlarged left level II cervical lymph nodes presenting for cervical lymph node biopsy under US guidance.  Currently some pain in left ear, throat with swallowing.  Able to eat and drink food/liquids.  No bleeding. History of intermittent atrial fibrillation.  Has been off Xarelto 2 days.  Past Medical History:  Diagnosis Date  . Anxiety   . Arthritis    gout  . Atrial fibrillation ([place]) 1989  . Bicuspid aortic valve    Last echo looks trileaflet  . Cardiomyopathy 08/2009   Mildly decreased EF  . Depression   . GERD (gastroesophageal reflux disease)   . Gout   . 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 Hyperlipidemia   . Hypertension   . Seizures ([place]) 1998   with heat stroke in the [employer] -none since    Past Surgical History:  Procedure Laterality Date  . CARDIAC CATHETERIZATION  03/2010   No sig coronary disease, EF ~40%  . CARDIOVERSION  1989  . TOTAL KNEE ARTHROPLASTY Left     Allergies: Patient has no known allergies.  Medications: Prior to Admission medications   Medication Sig Start Date End Date Taking? Authorizing Provider  allopurinol (ZYLOPRIM) 300 MG tablet Take 1 tablet (300 mg total) by mouth daily. 08/10/16  Yes Venia Carbon, MD  atorvastatin (LIPITOR) 20 MG tablet take 1 tablet by mouth once daily 10/08/16  Yes Venia Carbon, MD  benazepril (LOTENSIN) 10 MG tablet take 1 tablet by mouth once daily 05/06/17  Yes Deboraha Sprang, MD  carvedilol (COREG) 12.5 MG tablet take 1 tablet by mouth twice a day with food 01/30/16  Yes Wellington Hampshire, MD  FLUoxetine (PROZAC) 20 MG capsule take 3 capsules  by mouth once daily 07/23/16  Yes Venia Carbon, MD  HYDROcodone-acetaminophen (NORCO/VICODIN) 5-325 MG tablet take 1 to 2 tablets by mouth every 4 hours if needed 05/07/17  Yes [provider]  Multiple Vitamin (MULTIVITAMIN) tablet Take 1 tablet by mouth daily.   Yes [provider]  vitamin C (ASCORBIC ACID) 500 MG tablet Take 500 mg by mouth daily.    Yes [provider]  colchicine 0.6 MG tablet Take 1 tablet (0.6 mg total) by mouth daily. Patient taking differently: Take 0.6 mg by mouth daily as needed.  11/12/16   Edrick Kins, DPM  desoximetasone (TOPICORT) 0.25 % cream Apply 1 application topically 2 (two) times daily as needed.    [provider]  Ibuprofen 200 MG CAPS Take 800 mg by mouth 2 (two) times daily as needed.     [provider]  nitroGLYCERIN (NITRODUR - DOSED IN MG/24 HR) 0.2 mg/hr patch Place 1/4 of patch over affected region. Remove and replace once daily.  Slightly alter skin placement daily 10/12/16   Gerda Diss, DO  XARELTO 20 MG TABS tablet take 1 tablet by mouth once daily 12/03/16   Deboraha Sprang, MD     Family History  Problem Relation Age of Onset  . Heart attack Father        MI  . Crohn's disease Brother   . Diabetes Mother   .  Depression Mother   . Arthritis Mother   . COPD Mother   . Coronary artery disease Paternal Grandfather   . Pancreatic cancer Paternal Uncle   . Lung cancer Paternal Uncle   . Lung cancer Maternal Grandfather   . Thyroid cancer Paternal Aunt   . Lung cancer Paternal Uncle   . Hypertension Neg Hx   . Colon cancer Neg Hx   . Colon polyps Neg Hx   . Esophageal cancer Neg Hx   . Rectal cancer Neg Hx   . Stomach cancer Neg Hx     Social History   Social History  . Marital status: Married    Spouse name: N/A  . Number of children: 1  . Years of education: N/A   Occupational History  . Sales    Social History Main Topics  . Smoking status: Never Smoker  . Smokeless  tobacco: Never Used  . Alcohol use 1.2 - 1.8 oz/week    2 - 3 Cans of beer per week     Comment: occassional  . Drug use: No  . Sexual activity: Not Asked   Other Topics Concern  . None   Social History Narrative   Married with 1 son    ECOG Status: 1 - Symptomatic but completely ambulatory  Review of Systems: A 12 point ROS discussed and pertinent positives are indicated in the HPI above.  All other systems are negative.  Review of Systems  Constitutional: Negative.   HENT: Positive for ear pain and sore throat.   Respiratory: Negative.   Cardiovascular: Negative.   Gastrointestinal: Negative.   Genitourinary: Negative.   Musculoskeletal: Negative.   Neurological: Negative.     Vital Signs: BP 121/64   Pulse 65   Temp 98.3 F (36.8 C) (Oral)   Resp 12   Ht 5' 10.25" (1.784 m)   Wt 244 lb (110.7 kg)   SpO2 97%   BMI 34.76 kg/m   Physical Exam  Constitutional: He appears well-developed and well-nourished. No distress.  HENT:  Head: Normocephalic and atraumatic.  Mouth/Throat: Oropharynx is clear and moist. No oropharyngeal exudate.  Neck: Neck supple. No JVD present.  Palpable left cervical lymphadenopathy  Cardiovascular: Normal rate, regular rhythm and normal heart sounds.  Exam reveals no gallop and no friction rub.   No murmur heard. Pulmonary/Chest: Effort normal and breath sounds normal. No stridor. No respiratory distress. He has no wheezes. He has no rales.  Abdominal: Soft. Bowel sounds are normal. He exhibits no distension and no mass. There is no tenderness. There is no rebound and no guarding.  Musculoskeletal: He exhibits no edema.  Lymphadenopathy:    He has cervical adenopathy.  Neurological: He is alert.  Skin: Skin is warm and dry. He is not diaphoretic.  Vitals reviewed.   Imaging: Ct Soft Tissue Neck W Contrast  Result Date: 05/07/2017 CLINICAL DATA:  Left neck mass found 30 days ago. EXAM: CT NECK WITH CONTRAST TECHNIQUE:  Multidetector CT imaging of the neck was performed using the standard protocol following the bolus administration of intravenous contrast. CONTRAST:  49mL ISOVUE-300 IOPAMIDOL (ISOVUE-300) INJECTION 61% COMPARISON:  None. FINDINGS: Pharynx and larynx: 3.3 cm (craniocaudal) mass in the left tonsillar fossa that is primarily submucosal. No second mass is seen. There is no carotid encasement or masticator space involvement. The posterior and lateral aspect of the mass at least contacts the stylopharyngeus. Medial mass is indistinguishable from tongue muscles on coronal reformats and there is extension toward the glossal  tonsillar sulcus. Salivary glands: Negative Thyroid: Negative Lymph nodes: 3 enlarged and heterogeneously enhancing lymph nodes in the left jugular chain, largest individual node measuring 3.3 cm craniocaudal. No visible contralateral adenopathy. Vascular: Mild atherosclerotic calcification. Major vessels are patent. No carotid encasement. Limited intracranial: Negative Visualized orbits: Essentially not seen. Mastoids and visualized paranasal sinuses: Clear other than a mucous retention cyst on the floor of the left maxillary sinus Skeleton: No acute or aggressive finding. Upper chest: Borderline airway thickening.  No noted nodules. Specific staging depends on HPV status. Other: These results will be called to the ordering clinician or representative by the Radiologist Assistant, and communication documented in the PACS or zVision Dashboard. 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 The primary mass is 3.3 cm and predominantly submucosal. Three left upper jugular chain malignant nodes the largest measuring 3.3 cm. Electronically Signed   By: Monte Fantasia M.D.   On: 05/07/2017 08:18    Labs:  CBC:  Recent Labs  06/26/16 1027 05/09/17 1600  WBC 7.0 5.6  HGB 13.2 13.3  HCT 38.7* 38.0*  PLT 272.0 263    COAGS: No results for  input(s): INR, APTT in the last 8760 hours.  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  Risks and benefits discussed with the patient including, but not limited to bleeding, infection, damage to adjacent structures or low yield requiring additional tests. All of the patient's questions were answered, patient is agreeable to proceed. Consent signed and in chart.  Thank you for this interesting consult.  I greatly enjoyed meeting [name] and look forward to participating in their care.  A copy of this report was sent to the requesting provider on this date.  Electronically Signed: Azzie Roup, MD 05/13/2017, 11:01 AM     I spent a total of 30 Minutes in face to face in clinical consultation, greater than 50% of which was counseling/coordinating care for cervical lymph node biopsy.

## 2017-05-13 NOTE — Discharge Instructions (Signed)
Needle Biopsy, Care After °Refer to this sheet in the next few weeks. These instructions provide you with information about caring for yourself after your procedure. Your health care provider may also give you more specific instructions. Your treatment has been planned according to current medical practices, but problems sometimes occur. Call your health care provider if you have any problems or questions after your procedure. °What can I expect after the procedure? °After your procedure, it is common to have soreness, bruising, or mild pain at the biopsy site. This should go away in a few days. °Follow these instructions at home: °· Rest as directed by your health care provider. °· Take medicines only as directed by your health care provider. °· There are many different ways to close and cover the biopsy site, including stitches (sutures), skin glue, and adhesive strips. Follow your health care provider's instructions about: °? Biopsy site care. °? Bandage (dressing) changes and removal. °? Biopsy site closure removal. °· Check your biopsy site every day for signs of infection. Watch for: °? Redness, swelling, or pain. °? Fluid, blood, or pus. °Contact a health care provider if: °· You have a fever. °· You have redness, swelling, or pain at the biopsy site that lasts longer than a few days. °· You have fluid, blood, or pus coming from the biopsy site. °· You feel nauseous. °· You vomit. °Get help right away if: °· You have shortness of breath. °· You have trouble breathing. °· You have chest pain. °· You feel dizzy or you faint. °· You have bleeding that does not stop with pressure or a bandage. °· You cough up blood. °· You have pain in your abdomen. °This information is not intended to replace advice given to you by your health care provider. Make sure you discuss any questions you have with your health care provider. °Document Released: 12/07/2014 Document Revised: 12/29/2015 Document Reviewed:  07/19/2014 °Elsevier Interactive Patient Education © 2018 [employer] ° °

## 2017-05-14 ENCOUNTER — Encounter: Payer: Self-pay | Admitting: Oncology

## 2017-05-14 DIAGNOSIS — H5213 Myopia, bilateral: Secondary | ICD-10-CM | POA: Diagnosis not present

## 2017-05-14 DIAGNOSIS — H524 Presbyopia: Secondary | ICD-10-CM | POA: Diagnosis not present

## 2017-05-14 DIAGNOSIS — H52202 Unspecified astigmatism, left eye: Secondary | ICD-10-CM | POA: Diagnosis not present

## 2017-05-14 LAB — CYTOLOGY - NON PAP

## 2017-05-15 ENCOUNTER — Ambulatory Visit
Admission: RE | Admit: 2017-05-15 | Discharge: 2017-05-15 | Disposition: A | Discharge status: Home / Self Care | Payer: BLUE CROSS/BLUE SHIELD | Source: Ambulatory Visit | Attending: Radiation Oncology | Admitting: Radiation Oncology

## 2017-05-15 ENCOUNTER — Encounter: Payer: Self-pay | Admitting: Radiation Oncology

## 2017-05-15 ENCOUNTER — Encounter: Payer: Self-pay | Admitting: Oncology

## 2017-05-15 VITALS — BP 116/68 | HR 69 | Temp 97.6°F | Resp 20 | Wt 250.3 lb

## 2017-05-15 DIAGNOSIS — E785 Hyperlipidemia, unspecified: Secondary | ICD-10-CM | POA: Insufficient documentation

## 2017-05-15 DIAGNOSIS — C099 Malignant neoplasm of tonsil, unspecified: Secondary | ICD-10-CM | POA: Insufficient documentation

## 2017-05-15 DIAGNOSIS — I429 Cardiomyopathy, unspecified: Secondary | ICD-10-CM | POA: Diagnosis not present

## 2017-05-15 DIAGNOSIS — Z79899 Other long term (current) drug therapy: Secondary | ICD-10-CM | POA: Diagnosis not present

## 2017-05-15 DIAGNOSIS — F329 Major depressive disorder, single episode, unspecified: Secondary | ICD-10-CM | POA: Diagnosis not present

## 2017-05-15 DIAGNOSIS — K219 Gastro-esophageal reflux disease without esophagitis: Secondary | ICD-10-CM | POA: Diagnosis not present

## 2017-05-15 DIAGNOSIS — Z801 Family history of malignant neoplasm of trachea, bronchus and lung: Secondary | ICD-10-CM | POA: Insufficient documentation

## 2017-05-15 DIAGNOSIS — I4891 Unspecified atrial fibrillation: Secondary | ICD-10-CM | POA: Diagnosis not present

## 2017-05-15 DIAGNOSIS — M129 Arthropathy, unspecified: Secondary | ICD-10-CM | POA: Insufficient documentation

## 2017-05-15 DIAGNOSIS — F419 Anxiety disorder, unspecified: Secondary | ICD-10-CM | POA: Insufficient documentation

## 2017-05-15 DIAGNOSIS — M109 Gout, unspecified: Secondary | ICD-10-CM | POA: Insufficient documentation

## 2017-05-15 DIAGNOSIS — Q231 Congenital insufficiency of aortic valve: Secondary | ICD-10-CM | POA: Diagnosis not present

## 2017-05-15 DIAGNOSIS — Z800 Family history of malignant neoplasm of digestive organs: Secondary | ICD-10-CM | POA: Diagnosis not present

## 2017-05-15 DIAGNOSIS — Z7901 Long term (current) use of anticoagulants: Secondary | ICD-10-CM | POA: Insufficient documentation

## 2017-05-15 LAB — SURGICAL PATHOLOGY

## 2017-05-15 NOTE — Consult Note (Signed)
NEW PATIENT EVALUATION  Name: [name]  MRN: [id number]  Date:   05/15/2017     DOB: 09-Nov-1965   This 51 y.o. 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 Cancer    Pt is here for tonsillar cancer.     DIAGNOSIS: The encounter diagnosis was Tonsillar cancer ([place]).   PREVIOUS INVESTIGATIONS:  CT scan reviewed PET CT scan to reviewed after completion tomorrow Pathology report initially reviewed Clinical notes reviewed  HPI: patient is a 51 year old male who presented with left neck swelling pain in his left ear and some dysphasia over the past several months. He was seen by ENT and was noticed to have tonsillar hypertrophy and adenopathy significantly in his left neck at level II and 3 nodal station. He was sent toradiology for a CT-guided biopsy of the cervical lymph node which was positive formetastatic squamous cell carcinoma. HPV is pending.initial CT scan showed left tonsillar carcinoma ipsilateral nodal metastasis. The primary mass was 3 points 3 cm with no largest lymph node measuring 3.3 cm. Again PET CT scan has been scheduled for tomorrow. He's been seen by medical oncology with recommendation for concurrent chemoradiation. He is seen today for radiation oncology opinion. He is doing fairly well still has some dysphasia. He noted increased swelling in his neck after his CT-guided biopsy.  PLANNED TREATMENT REGIMEN: concurrent chemoradiation  PAST MEDICAL HISTORY:  has a past medical history of Anxiety; Arthritis; Atrial fibrillation ([place]) (1989); Bicuspid aortic valve; Cardiomyopathy (08/2009); Depression; GERD (gastroesophageal reflux disease); Gout; Heat stroke (1998); Hyperlipidemia; Hypertension; and Seizures ([place]) (1998).    PAST SURGICAL  HISTORY:  Past Surgical History:  Procedure Laterality Date  . CARDIAC CATHETERIZATION  03/2010   No sig coronary disease, EF ~40%  . CARDIOVERSION  1989  . TOTAL KNEE ARTHROPLASTY Left     FAMILY HISTORY: family history includes Arthritis in his mother; COPD in his mother; Coronary artery disease in his paternal grandfather; Crohn's disease in his brother; Depression in his mother; Diabetes in his mother; Heart attack in his father; Lung cancer in his maternal grandfather, paternal uncle, and paternal uncle; Pancreatic cancer in his paternal uncle; Thyroid cancer in his paternal aunt.  SOCIAL HISTORY:  reports that he has never smoked. He has never used smokeless tobacco. He reports that he drinks about 1.2 - 1.8 oz of alcohol per week . He reports that he does not use drugs.  ALLERGIES: Patient has no known allergies.  MEDICATIONS:  Current Outpatient Prescriptions  Medication Sig Dispense Refill  . allopurinol (ZYLOPRIM) 300 MG tablet Take 1 tablet (300 mg total) by mouth daily. 90 tablet 3  . atorvastatin (LIPITOR) 20 MG tablet take 1 tablet by mouth once daily 90 tablet 2  . benazepril (LOTENSIN) 10 MG tablet take 1 tablet by mouth once daily 30 tablet 3  . carvedilol (COREG) 12.5 MG tablet take 1 tablet by mouth twice a day with food 60 tablet 0  . colchicine 0.6 MG tablet Take 1 tablet (0.6 mg total) by mouth daily. (Patient taking differently: Take 0.6 mg by mouth daily as needed. ) 30 tablet 0  . desoximetasone (TOPICORT) 0.25 % cream Apply 1 application topically 2 (two) times daily as needed.    Marland Kitchen FLUoxetine (PROZAC) 20 MG capsule take 3 capsules  by mouth once daily 270 capsule 3  . HYDROcodone-acetaminophen (NORCO/VICODIN) 5-325 MG tablet take 1 to 2 tablets by mouth every 4 hours if needed  0  . Ibuprofen 200 MG CAPS Take 800 mg by mouth 2 (two) times daily as needed.     . Multiple Vitamin (MULTIVITAMIN) tablet Take 1 tablet by mouth daily.    . nitroGLYCERIN (NITRODUR -  DOSED IN MG/24 HR) 0.2 mg/hr patch Place 1/4 of patch over affected region. Remove and replace once daily.  Slightly alter skin placement daily 30 patch 1  . vitamin C (ASCORBIC ACID) 500 MG tablet Take 500 mg by mouth daily.     Alveda Reasons 20 MG TABS tablet take 1 tablet by mouth once daily 30 tablet 5   No current facility-administered medications for this encounter.     ECOG PERFORMANCE STATUS:  1 - Symptomatic but completely ambulatory  REVIEW OF SYSTEMS: except for the minimal dysphasia and some left neck pain associated with nodal metastasis Patient denies any weight loss, fatigue, weakness, fever, chills or night sweats. Patient denies any loss of vision, blurred vision. Patient denies any ringing  of the ears or hearing loss. No irregular heartbeat. Patient denies heart murmur or history of fainting. Patient denies any chest pain or pain radiating to her upper extremities. Patient denies any shortness of breath, difficulty breathing at night, cough or hemoptysis. Patient denies any swelling in the lower legs. Patient denies any nausea vomiting, vomiting of blood, or coffee ground material in the vomitus. Patient denies any stomach pain. Patient states has had normal bowel movements no significant constipation or diarrhea. Patient denies any dysuria, hematuria or significant nocturia. Patient denies any problems walking, swelling in the joints or loss of balance. Patient denies any skin changes, loss of hair or loss of weight. Patient denies any excessive worrying or anxiety or significant depression. Patient denies any problems with insomnia. Patient denies excessive thirst, polyuria, polydipsia. Patient denies any swollen glands, patient denies easy bruising or easy bleeding. Patient denies any recent infections, allergies or URI. Patient "s visual fields have not changed significantly in recent time.    PHYSICAL EXAM: BP 116/68   Pulse 69   Temp 97.6 F (36.4 C)   Resp 20   Wt 250 lb 5.3  oz (113.6 kg)   BMI 35.66 kg/m  Oral cavity are clear teeth are in good state of repair. He does have hypertrophy of his left tonsillar fossa. Indirect mirror examination shows upper airway clear vallecula and base of tongue within normal limits. He has significant adenopathy in his left neck. No right neck adenopathy or supraclavicular adenopathy is identified. Left neck is tender. Well-developed well-nourished patient in NAD. HEENT reveals PERLA, EOMI, discs not visualized.  Oral cavity is clear. No oral mucosal lesions are identified. Neck is clear without evidence of cervical or supraclavicular adenopathy. Lungs are clear to A&P. Cardiac examination is essentially unremarkable with regular rate and rhythm without murmur rub or thrill. Abdomen is benign with no organomegaly or masses noted. Motor sensory and DTR levels are equal and symmetric in the upper and lower extremities. Cranial nerves II through XII are grossly intact. Proprioception is intact. No peripheral adenopathy or edema is identified. No motor or sensory levels are noted. Crude visual fields are within normal range.  LABORATORY DATA: initial cytology reports reviewed    RADIOLOGY RESULTS:CT scan reviewed PET CT scan pending   IMPRESSION: locally advanced squamous cell carcinoma the left tonsillar fossa in 51 year old  male  PLAN: at this time like to review for complete staging his PET/CT scan which will be performed tomorrow. Preliminarily I would favor concurrent chemoradiation with curative intent. Would plan on delivering 7000 cGy to his left tonsillar region and involved lymph nodes dose painting with I MRT treating the remainder of his neck nodes to 5400 cGy. I would use PET fusion study for treatment planning. We may have to alter his treatment plan should we significant decrease in his left nodal metastasis during treatment. Risks and benefits of treatment including increased dysphasia skin reaction fatigue possible xerostomia  alteration of taste alteration of blood counts all were discussed in detail with the patient and his wife. They both seem to comprehend my treatment plan well. I have tentatively set up and ordered CT simulation for early next week.There will be extra effort by both professional staff as well as technical staff to coordinate and manage concurrent chemoradiation and ensuing side effects during his treatments.    I would like to take this opportunity to thank you for allowing me to participate in the care of your patient.Armstead Peaks., MD

## 2017-05-16 ENCOUNTER — Ambulatory Visit
Admission: RE | Admit: 2017-05-16 | Discharge: 2017-05-16 | Disposition: A | Discharge status: Home / Self Care | Payer: BLUE CROSS/BLUE SHIELD | Source: Ambulatory Visit | Attending: Oncology | Admitting: Oncology

## 2017-05-16 DIAGNOSIS — R918 Other nonspecific abnormal finding of lung field: Secondary | ICD-10-CM | POA: Diagnosis not present

## 2017-05-16 DIAGNOSIS — R220 Localized swelling, mass and lump, head: Secondary | ICD-10-CM | POA: Insufficient documentation

## 2017-05-16 DIAGNOSIS — J358 Other chronic diseases of tonsils and adenoids: Secondary | ICD-10-CM

## 2017-05-16 LAB — GLUCOSE, CAPILLARY: Glucose-Capillary: 106 mg/dL — ABNORMAL HIGH (ref 65–99)

## 2017-05-16 MED ORDER — FLUDEOXYGLUCOSE F - 18 (FDG) INJECTION
13.1400 | Freq: Once | INTRAVENOUS | Status: AC | PRN
  Administered 2017-05-16: 13.14 via INTRAVENOUS

## 2017-05-17 ENCOUNTER — Other Ambulatory Visit: Payer: Self-pay | Admitting: *Deleted

## 2017-05-20 ENCOUNTER — Ambulatory Visit: Payer: BLUE CROSS/BLUE SHIELD

## 2017-05-20 ENCOUNTER — Encounter: Payer: Self-pay | Admitting: Oncology

## 2017-05-20 DIAGNOSIS — C099 Malignant neoplasm of tonsil, unspecified: Secondary | ICD-10-CM | POA: Insufficient documentation

## 2017-05-20 DIAGNOSIS — M792 Neuralgia and neuritis, unspecified: Secondary | ICD-10-CM | POA: Diagnosis not present

## 2017-05-20 DIAGNOSIS — C779 Secondary and unspecified malignant neoplasm of lymph node, unspecified: Secondary | ICD-10-CM | POA: Insufficient documentation

## 2017-05-20 DIAGNOSIS — C770 Secondary and unspecified malignant neoplasm of lymph nodes of head, face and neck: Secondary | ICD-10-CM | POA: Diagnosis not present

## 2017-05-20 DIAGNOSIS — H9202 Otalgia, left ear: Secondary | ICD-10-CM | POA: Diagnosis not present

## 2017-05-21 DIAGNOSIS — C099 Malignant neoplasm of tonsil, unspecified: Secondary | ICD-10-CM | POA: Diagnosis not present

## 2017-05-21 DIAGNOSIS — C770 Secondary and unspecified malignant neoplasm of lymph nodes of head, face and neck: Secondary | ICD-10-CM | POA: Diagnosis not present

## 2017-05-23 ENCOUNTER — Ambulatory Visit: Payer: BLUE CROSS/BLUE SHIELD | Admitting: Oncology

## 2017-05-24 DIAGNOSIS — H903 Sensorineural hearing loss, bilateral: Secondary | ICD-10-CM | POA: Diagnosis not present

## 2017-05-29 DIAGNOSIS — C770 Secondary and unspecified malignant neoplasm of lymph nodes of head, face and neck: Secondary | ICD-10-CM | POA: Diagnosis not present

## 2017-05-31 DIAGNOSIS — C099 Malignant neoplasm of tonsil, unspecified: Secondary | ICD-10-CM | POA: Diagnosis not present

## 2017-05-31 DIAGNOSIS — Z5111 Encounter for antineoplastic chemotherapy: Secondary | ICD-10-CM | POA: Diagnosis not present

## 2017-05-31 DIAGNOSIS — C779 Secondary and unspecified malignant neoplasm of lymph node, unspecified: Secondary | ICD-10-CM | POA: Diagnosis not present

## 2017-05-31 DIAGNOSIS — C770 Secondary and unspecified malignant neoplasm of lymph nodes of head, face and neck: Secondary | ICD-10-CM | POA: Diagnosis not present

## 2017-06-03 DIAGNOSIS — C770 Secondary and unspecified malignant neoplasm of lymph nodes of head, face and neck: Secondary | ICD-10-CM | POA: Diagnosis not present

## 2017-06-03 DIAGNOSIS — C099 Malignant neoplasm of tonsil, unspecified: Secondary | ICD-10-CM | POA: Diagnosis not present

## 2017-06-03 DIAGNOSIS — Z5111 Encounter for antineoplastic chemotherapy: Secondary | ICD-10-CM | POA: Diagnosis not present

## 2017-06-03 DIAGNOSIS — C779 Secondary and unspecified malignant neoplasm of lymph node, unspecified: Secondary | ICD-10-CM | POA: Diagnosis not present

## 2017-06-04 ENCOUNTER — Encounter: Payer: Self-pay | Admitting: Otolaryngology

## 2017-06-04 DIAGNOSIS — C770 Secondary and unspecified malignant neoplasm of lymph nodes of head, face and neck: Secondary | ICD-10-CM | POA: Diagnosis not present

## 2017-06-05 ENCOUNTER — Telehealth: Payer: Self-pay | Admitting: Internal Medicine

## 2017-06-05 DIAGNOSIS — C770 Secondary and unspecified malignant neoplasm of lymph nodes of head, face and neck: Secondary | ICD-10-CM | POA: Diagnosis not present

## 2017-06-05 NOTE — Telephone Encounter (Signed)
Spoke with patient and he started chemotherapy on Monday 06/03/17 and he wanted to see if he should be seen sooner. Advised that I would send this message to his providers and that we would be in touch. He states that he was not sure if he would need to be seen sooner or not based on his treatments. He denies any current symptoms. Let him know that we would be in touch if appointment is needed sooner. He was appreciative for the call and had no further questions at this time.

## 2017-06-05 NOTE — Telephone Encounter (Signed)
Pt calling stating he's starting cancer treatment (kemo) on Monday 10/29 He wanted to know if there may be a change in maybe needing to see him sooner for he is a heart patient  Would like a call back

## 2017-06-06 DIAGNOSIS — L598 Other specified disorders of the skin and subcutaneous tissue related to radiation: Secondary | ICD-10-CM | POA: Diagnosis not present

## 2017-06-06 DIAGNOSIS — C770 Secondary and unspecified malignant neoplasm of lymph nodes of head, face and neck: Secondary | ICD-10-CM | POA: Diagnosis not present

## 2017-06-06 DIAGNOSIS — E860 Dehydration: Secondary | ICD-10-CM | POA: Diagnosis not present

## 2017-06-06 DIAGNOSIS — K1230 Oral mucositis (ulcerative), unspecified: Secondary | ICD-10-CM | POA: Diagnosis not present

## 2017-06-06 DIAGNOSIS — H9313 Tinnitus, bilateral: Secondary | ICD-10-CM | POA: Diagnosis not present

## 2017-06-06 DIAGNOSIS — K117 Disturbances of salivary secretion: Secondary | ICD-10-CM | POA: Diagnosis not present

## 2017-06-06 DIAGNOSIS — Z510 Encounter for antineoplastic radiation therapy: Secondary | ICD-10-CM | POA: Diagnosis not present

## 2017-06-06 DIAGNOSIS — L538 Other specified erythematous conditions: Secondary | ICD-10-CM | POA: Diagnosis not present

## 2017-06-06 DIAGNOSIS — R110 Nausea: Secondary | ICD-10-CM | POA: Diagnosis not present

## 2017-06-06 DIAGNOSIS — H9202 Otalgia, left ear: Secondary | ICD-10-CM | POA: Diagnosis not present

## 2017-06-06 DIAGNOSIS — Z713 Dietary counseling and surveillance: Secondary | ICD-10-CM | POA: Diagnosis not present

## 2017-06-06 DIAGNOSIS — K219 Gastro-esophageal reflux disease without esophagitis: Secondary | ICD-10-CM | POA: Diagnosis not present

## 2017-06-06 DIAGNOSIS — C099 Malignant neoplasm of tonsil, unspecified: Secondary | ICD-10-CM | POA: Diagnosis not present

## 2017-06-06 DIAGNOSIS — Z6837 Body mass index (BMI) 37.0-37.9, adult: Secondary | ICD-10-CM | POA: Diagnosis not present

## 2017-06-07 DIAGNOSIS — R110 Nausea: Secondary | ICD-10-CM | POA: Diagnosis not present

## 2017-06-07 DIAGNOSIS — E860 Dehydration: Secondary | ICD-10-CM | POA: Diagnosis not present

## 2017-06-07 DIAGNOSIS — Z6837 Body mass index (BMI) 37.0-37.9, adult: Secondary | ICD-10-CM | POA: Diagnosis not present

## 2017-06-07 DIAGNOSIS — L598 Other specified disorders of the skin and subcutaneous tissue related to radiation: Secondary | ICD-10-CM | POA: Diagnosis not present

## 2017-06-07 DIAGNOSIS — Z510 Encounter for antineoplastic radiation therapy: Secondary | ICD-10-CM | POA: Diagnosis not present

## 2017-06-07 DIAGNOSIS — Z5111 Encounter for antineoplastic chemotherapy: Secondary | ICD-10-CM | POA: Diagnosis not present

## 2017-06-07 DIAGNOSIS — K117 Disturbances of salivary secretion: Secondary | ICD-10-CM | POA: Diagnosis not present

## 2017-06-07 DIAGNOSIS — C770 Secondary and unspecified malignant neoplasm of lymph nodes of head, face and neck: Secondary | ICD-10-CM | POA: Diagnosis not present

## 2017-06-07 DIAGNOSIS — C099 Malignant neoplasm of tonsil, unspecified: Secondary | ICD-10-CM | POA: Diagnosis not present

## 2017-06-07 DIAGNOSIS — Z713 Dietary counseling and surveillance: Secondary | ICD-10-CM | POA: Diagnosis not present

## 2017-06-07 DIAGNOSIS — C779 Secondary and unspecified malignant neoplasm of lymph node, unspecified: Secondary | ICD-10-CM | POA: Diagnosis not present

## 2017-06-07 DIAGNOSIS — H9313 Tinnitus, bilateral: Secondary | ICD-10-CM | POA: Diagnosis not present

## 2017-06-07 DIAGNOSIS — K219 Gastro-esophageal reflux disease without esophagitis: Secondary | ICD-10-CM | POA: Diagnosis not present

## 2017-06-07 DIAGNOSIS — K1230 Oral mucositis (ulcerative), unspecified: Secondary | ICD-10-CM | POA: Diagnosis not present

## 2017-06-07 DIAGNOSIS — H9202 Otalgia, left ear: Secondary | ICD-10-CM | POA: Diagnosis not present

## 2017-06-07 DIAGNOSIS — L538 Other specified erythematous conditions: Secondary | ICD-10-CM | POA: Diagnosis not present

## 2017-06-10 DIAGNOSIS — C099 Malignant neoplasm of tonsil, unspecified: Secondary | ICD-10-CM | POA: Diagnosis not present

## 2017-06-10 DIAGNOSIS — Z713 Dietary counseling and surveillance: Secondary | ICD-10-CM | POA: Diagnosis not present

## 2017-06-10 DIAGNOSIS — Z510 Encounter for antineoplastic radiation therapy: Secondary | ICD-10-CM | POA: Diagnosis not present

## 2017-06-10 DIAGNOSIS — Z6837 Body mass index (BMI) 37.0-37.9, adult: Secondary | ICD-10-CM | POA: Diagnosis not present

## 2017-06-10 DIAGNOSIS — K1230 Oral mucositis (ulcerative), unspecified: Secondary | ICD-10-CM | POA: Diagnosis not present

## 2017-06-10 DIAGNOSIS — C770 Secondary and unspecified malignant neoplasm of lymph nodes of head, face and neck: Secondary | ICD-10-CM | POA: Diagnosis not present

## 2017-06-10 DIAGNOSIS — K117 Disturbances of salivary secretion: Secondary | ICD-10-CM | POA: Diagnosis not present

## 2017-06-10 DIAGNOSIS — C779 Secondary and unspecified malignant neoplasm of lymph node, unspecified: Secondary | ICD-10-CM | POA: Diagnosis not present

## 2017-06-10 DIAGNOSIS — E860 Dehydration: Secondary | ICD-10-CM | POA: Diagnosis not present

## 2017-06-10 DIAGNOSIS — L538 Other specified erythematous conditions: Secondary | ICD-10-CM | POA: Diagnosis not present

## 2017-06-10 DIAGNOSIS — H9202 Otalgia, left ear: Secondary | ICD-10-CM | POA: Diagnosis not present

## 2017-06-10 DIAGNOSIS — H9313 Tinnitus, bilateral: Secondary | ICD-10-CM | POA: Diagnosis not present

## 2017-06-10 DIAGNOSIS — L598 Other specified disorders of the skin and subcutaneous tissue related to radiation: Secondary | ICD-10-CM | POA: Diagnosis not present

## 2017-06-10 DIAGNOSIS — K219 Gastro-esophageal reflux disease without esophagitis: Secondary | ICD-10-CM | POA: Diagnosis not present

## 2017-06-10 DIAGNOSIS — R110 Nausea: Secondary | ICD-10-CM | POA: Diagnosis not present

## 2017-06-10 DIAGNOSIS — Z5111 Encounter for antineoplastic chemotherapy: Secondary | ICD-10-CM | POA: Diagnosis not present

## 2017-06-11 DIAGNOSIS — H9313 Tinnitus, bilateral: Secondary | ICD-10-CM | POA: Diagnosis not present

## 2017-06-11 DIAGNOSIS — K219 Gastro-esophageal reflux disease without esophagitis: Secondary | ICD-10-CM | POA: Diagnosis not present

## 2017-06-11 DIAGNOSIS — H9202 Otalgia, left ear: Secondary | ICD-10-CM | POA: Diagnosis not present

## 2017-06-11 DIAGNOSIS — C099 Malignant neoplasm of tonsil, unspecified: Secondary | ICD-10-CM | POA: Diagnosis not present

## 2017-06-11 DIAGNOSIS — L538 Other specified erythematous conditions: Secondary | ICD-10-CM | POA: Diagnosis not present

## 2017-06-11 DIAGNOSIS — K117 Disturbances of salivary secretion: Secondary | ICD-10-CM | POA: Diagnosis not present

## 2017-06-11 DIAGNOSIS — E860 Dehydration: Secondary | ICD-10-CM | POA: Diagnosis not present

## 2017-06-11 DIAGNOSIS — C770 Secondary and unspecified malignant neoplasm of lymph nodes of head, face and neck: Secondary | ICD-10-CM | POA: Diagnosis not present

## 2017-06-11 DIAGNOSIS — K1230 Oral mucositis (ulcerative), unspecified: Secondary | ICD-10-CM | POA: Diagnosis not present

## 2017-06-11 DIAGNOSIS — R110 Nausea: Secondary | ICD-10-CM | POA: Diagnosis not present

## 2017-06-11 DIAGNOSIS — Z510 Encounter for antineoplastic radiation therapy: Secondary | ICD-10-CM | POA: Diagnosis not present

## 2017-06-11 DIAGNOSIS — L598 Other specified disorders of the skin and subcutaneous tissue related to radiation: Secondary | ICD-10-CM | POA: Diagnosis not present

## 2017-06-11 DIAGNOSIS — Z713 Dietary counseling and surveillance: Secondary | ICD-10-CM | POA: Diagnosis not present

## 2017-06-11 DIAGNOSIS — Z6837 Body mass index (BMI) 37.0-37.9, adult: Secondary | ICD-10-CM | POA: Diagnosis not present

## 2017-06-11 NOTE — Telephone Encounter (Signed)
Left detailed voicemail message per release form that Dr. Caryl Comes states we can see him sooner if he is having problem but otherwise Good luck with his Chemo with instructions to call back.

## 2017-06-11 NOTE — Telephone Encounter (Signed)
If he has a problem we will see him sooner  Good luck with chemo

## 2017-06-12 DIAGNOSIS — Z6837 Body mass index (BMI) 37.0-37.9, adult: Secondary | ICD-10-CM | POA: Diagnosis not present

## 2017-06-12 DIAGNOSIS — E860 Dehydration: Secondary | ICD-10-CM | POA: Diagnosis not present

## 2017-06-12 DIAGNOSIS — K219 Gastro-esophageal reflux disease without esophagitis: Secondary | ICD-10-CM | POA: Diagnosis not present

## 2017-06-12 DIAGNOSIS — L538 Other specified erythematous conditions: Secondary | ICD-10-CM | POA: Diagnosis not present

## 2017-06-12 DIAGNOSIS — H9202 Otalgia, left ear: Secondary | ICD-10-CM | POA: Diagnosis not present

## 2017-06-12 DIAGNOSIS — L598 Other specified disorders of the skin and subcutaneous tissue related to radiation: Secondary | ICD-10-CM | POA: Diagnosis not present

## 2017-06-12 DIAGNOSIS — H9313 Tinnitus, bilateral: Secondary | ICD-10-CM | POA: Diagnosis not present

## 2017-06-12 DIAGNOSIS — Z713 Dietary counseling and surveillance: Secondary | ICD-10-CM | POA: Diagnosis not present

## 2017-06-12 DIAGNOSIS — K117 Disturbances of salivary secretion: Secondary | ICD-10-CM | POA: Diagnosis not present

## 2017-06-12 DIAGNOSIS — R110 Nausea: Secondary | ICD-10-CM | POA: Diagnosis not present

## 2017-06-12 DIAGNOSIS — K1230 Oral mucositis (ulcerative), unspecified: Secondary | ICD-10-CM | POA: Diagnosis not present

## 2017-06-12 DIAGNOSIS — C099 Malignant neoplasm of tonsil, unspecified: Secondary | ICD-10-CM | POA: Diagnosis not present

## 2017-06-12 DIAGNOSIS — Z510 Encounter for antineoplastic radiation therapy: Secondary | ICD-10-CM | POA: Diagnosis not present

## 2017-06-12 DIAGNOSIS — C770 Secondary and unspecified malignant neoplasm of lymph nodes of head, face and neck: Secondary | ICD-10-CM | POA: Diagnosis not present

## 2017-06-13 DIAGNOSIS — H9202 Otalgia, left ear: Secondary | ICD-10-CM | POA: Diagnosis not present

## 2017-06-13 DIAGNOSIS — H9313 Tinnitus, bilateral: Secondary | ICD-10-CM | POA: Diagnosis not present

## 2017-06-13 DIAGNOSIS — K219 Gastro-esophageal reflux disease without esophagitis: Secondary | ICD-10-CM | POA: Diagnosis not present

## 2017-06-13 DIAGNOSIS — Z6837 Body mass index (BMI) 37.0-37.9, adult: Secondary | ICD-10-CM | POA: Diagnosis not present

## 2017-06-13 DIAGNOSIS — Z510 Encounter for antineoplastic radiation therapy: Secondary | ICD-10-CM | POA: Diagnosis not present

## 2017-06-13 DIAGNOSIS — C099 Malignant neoplasm of tonsil, unspecified: Secondary | ICD-10-CM | POA: Diagnosis not present

## 2017-06-13 DIAGNOSIS — L598 Other specified disorders of the skin and subcutaneous tissue related to radiation: Secondary | ICD-10-CM | POA: Diagnosis not present

## 2017-06-13 DIAGNOSIS — R110 Nausea: Secondary | ICD-10-CM | POA: Diagnosis not present

## 2017-06-13 DIAGNOSIS — C770 Secondary and unspecified malignant neoplasm of lymph nodes of head, face and neck: Secondary | ICD-10-CM | POA: Diagnosis not present

## 2017-06-13 DIAGNOSIS — E860 Dehydration: Secondary | ICD-10-CM | POA: Diagnosis not present

## 2017-06-13 DIAGNOSIS — K1230 Oral mucositis (ulcerative), unspecified: Secondary | ICD-10-CM | POA: Diagnosis not present

## 2017-06-13 DIAGNOSIS — Z713 Dietary counseling and surveillance: Secondary | ICD-10-CM | POA: Diagnosis not present

## 2017-06-13 DIAGNOSIS — L538 Other specified erythematous conditions: Secondary | ICD-10-CM | POA: Diagnosis not present

## 2017-06-13 DIAGNOSIS — K117 Disturbances of salivary secretion: Secondary | ICD-10-CM | POA: Diagnosis not present

## 2017-06-14 DIAGNOSIS — L598 Other specified disorders of the skin and subcutaneous tissue related to radiation: Secondary | ICD-10-CM | POA: Diagnosis not present

## 2017-06-14 DIAGNOSIS — K1230 Oral mucositis (ulcerative), unspecified: Secondary | ICD-10-CM | POA: Diagnosis not present

## 2017-06-14 DIAGNOSIS — K117 Disturbances of salivary secretion: Secondary | ICD-10-CM | POA: Diagnosis not present

## 2017-06-14 DIAGNOSIS — H9202 Otalgia, left ear: Secondary | ICD-10-CM | POA: Diagnosis not present

## 2017-06-14 DIAGNOSIS — K219 Gastro-esophageal reflux disease without esophagitis: Secondary | ICD-10-CM | POA: Diagnosis not present

## 2017-06-14 DIAGNOSIS — Z6837 Body mass index (BMI) 37.0-37.9, adult: Secondary | ICD-10-CM | POA: Diagnosis not present

## 2017-06-14 DIAGNOSIS — Z713 Dietary counseling and surveillance: Secondary | ICD-10-CM | POA: Diagnosis not present

## 2017-06-14 DIAGNOSIS — L538 Other specified erythematous conditions: Secondary | ICD-10-CM | POA: Diagnosis not present

## 2017-06-14 DIAGNOSIS — E860 Dehydration: Secondary | ICD-10-CM | POA: Diagnosis not present

## 2017-06-14 DIAGNOSIS — C770 Secondary and unspecified malignant neoplasm of lymph nodes of head, face and neck: Secondary | ICD-10-CM | POA: Diagnosis not present

## 2017-06-14 DIAGNOSIS — C779 Secondary and unspecified malignant neoplasm of lymph node, unspecified: Secondary | ICD-10-CM | POA: Diagnosis not present

## 2017-06-14 DIAGNOSIS — Z5111 Encounter for antineoplastic chemotherapy: Secondary | ICD-10-CM | POA: Diagnosis not present

## 2017-06-14 DIAGNOSIS — Z510 Encounter for antineoplastic radiation therapy: Secondary | ICD-10-CM | POA: Diagnosis not present

## 2017-06-14 DIAGNOSIS — C099 Malignant neoplasm of tonsil, unspecified: Secondary | ICD-10-CM | POA: Diagnosis not present

## 2017-06-14 DIAGNOSIS — R110 Nausea: Secondary | ICD-10-CM | POA: Diagnosis not present

## 2017-06-14 DIAGNOSIS — H9313 Tinnitus, bilateral: Secondary | ICD-10-CM | POA: Diagnosis not present

## 2017-06-17 DIAGNOSIS — C779 Secondary and unspecified malignant neoplasm of lymph node, unspecified: Secondary | ICD-10-CM | POA: Diagnosis not present

## 2017-06-17 DIAGNOSIS — Z510 Encounter for antineoplastic radiation therapy: Secondary | ICD-10-CM | POA: Diagnosis not present

## 2017-06-17 DIAGNOSIS — C099 Malignant neoplasm of tonsil, unspecified: Secondary | ICD-10-CM | POA: Diagnosis not present

## 2017-06-17 DIAGNOSIS — R110 Nausea: Secondary | ICD-10-CM | POA: Diagnosis not present

## 2017-06-17 DIAGNOSIS — Z713 Dietary counseling and surveillance: Secondary | ICD-10-CM | POA: Diagnosis not present

## 2017-06-17 DIAGNOSIS — H9313 Tinnitus, bilateral: Secondary | ICD-10-CM | POA: Diagnosis not present

## 2017-06-17 DIAGNOSIS — H9202 Otalgia, left ear: Secondary | ICD-10-CM | POA: Diagnosis not present

## 2017-06-17 DIAGNOSIS — L538 Other specified erythematous conditions: Secondary | ICD-10-CM | POA: Diagnosis not present

## 2017-06-17 DIAGNOSIS — Z5111 Encounter for antineoplastic chemotherapy: Secondary | ICD-10-CM | POA: Diagnosis not present

## 2017-06-17 DIAGNOSIS — K219 Gastro-esophageal reflux disease without esophagitis: Secondary | ICD-10-CM | POA: Diagnosis not present

## 2017-06-17 DIAGNOSIS — K117 Disturbances of salivary secretion: Secondary | ICD-10-CM | POA: Diagnosis not present

## 2017-06-17 DIAGNOSIS — C770 Secondary and unspecified malignant neoplasm of lymph nodes of head, face and neck: Secondary | ICD-10-CM | POA: Diagnosis not present

## 2017-06-17 DIAGNOSIS — Z6837 Body mass index (BMI) 37.0-37.9, adult: Secondary | ICD-10-CM | POA: Diagnosis not present

## 2017-06-17 DIAGNOSIS — K1230 Oral mucositis (ulcerative), unspecified: Secondary | ICD-10-CM | POA: Diagnosis not present

## 2017-06-17 DIAGNOSIS — L598 Other specified disorders of the skin and subcutaneous tissue related to radiation: Secondary | ICD-10-CM | POA: Diagnosis not present

## 2017-06-17 DIAGNOSIS — E860 Dehydration: Secondary | ICD-10-CM | POA: Diagnosis not present

## 2017-06-18 DIAGNOSIS — H9313 Tinnitus, bilateral: Secondary | ICD-10-CM | POA: Diagnosis not present

## 2017-06-18 DIAGNOSIS — Z6837 Body mass index (BMI) 37.0-37.9, adult: Secondary | ICD-10-CM | POA: Diagnosis not present

## 2017-06-18 DIAGNOSIS — C099 Malignant neoplasm of tonsil, unspecified: Secondary | ICD-10-CM | POA: Diagnosis not present

## 2017-06-18 DIAGNOSIS — H9202 Otalgia, left ear: Secondary | ICD-10-CM | POA: Diagnosis not present

## 2017-06-18 DIAGNOSIS — K219 Gastro-esophageal reflux disease without esophagitis: Secondary | ICD-10-CM | POA: Diagnosis not present

## 2017-06-18 DIAGNOSIS — R110 Nausea: Secondary | ICD-10-CM | POA: Diagnosis not present

## 2017-06-18 DIAGNOSIS — E860 Dehydration: Secondary | ICD-10-CM | POA: Diagnosis not present

## 2017-06-18 DIAGNOSIS — K117 Disturbances of salivary secretion: Secondary | ICD-10-CM | POA: Diagnosis not present

## 2017-06-18 DIAGNOSIS — Z713 Dietary counseling and surveillance: Secondary | ICD-10-CM | POA: Diagnosis not present

## 2017-06-18 DIAGNOSIS — C770 Secondary and unspecified malignant neoplasm of lymph nodes of head, face and neck: Secondary | ICD-10-CM | POA: Diagnosis not present

## 2017-06-18 DIAGNOSIS — L598 Other specified disorders of the skin and subcutaneous tissue related to radiation: Secondary | ICD-10-CM | POA: Diagnosis not present

## 2017-06-18 DIAGNOSIS — Z510 Encounter for antineoplastic radiation therapy: Secondary | ICD-10-CM | POA: Diagnosis not present

## 2017-06-18 DIAGNOSIS — K1230 Oral mucositis (ulcerative), unspecified: Secondary | ICD-10-CM | POA: Diagnosis not present

## 2017-06-18 DIAGNOSIS — L538 Other specified erythematous conditions: Secondary | ICD-10-CM | POA: Diagnosis not present

## 2017-06-19 DIAGNOSIS — L598 Other specified disorders of the skin and subcutaneous tissue related to radiation: Secondary | ICD-10-CM | POA: Diagnosis not present

## 2017-06-19 DIAGNOSIS — K1230 Oral mucositis (ulcerative), unspecified: Secondary | ICD-10-CM | POA: Diagnosis not present

## 2017-06-19 DIAGNOSIS — E860 Dehydration: Secondary | ICD-10-CM | POA: Diagnosis not present

## 2017-06-19 DIAGNOSIS — Z510 Encounter for antineoplastic radiation therapy: Secondary | ICD-10-CM | POA: Diagnosis not present

## 2017-06-19 DIAGNOSIS — R110 Nausea: Secondary | ICD-10-CM | POA: Diagnosis not present

## 2017-06-19 DIAGNOSIS — C770 Secondary and unspecified malignant neoplasm of lymph nodes of head, face and neck: Secondary | ICD-10-CM | POA: Diagnosis not present

## 2017-06-19 DIAGNOSIS — H9313 Tinnitus, bilateral: Secondary | ICD-10-CM | POA: Diagnosis not present

## 2017-06-19 DIAGNOSIS — K219 Gastro-esophageal reflux disease without esophagitis: Secondary | ICD-10-CM | POA: Diagnosis not present

## 2017-06-19 DIAGNOSIS — Z6837 Body mass index (BMI) 37.0-37.9, adult: Secondary | ICD-10-CM | POA: Diagnosis not present

## 2017-06-19 DIAGNOSIS — K117 Disturbances of salivary secretion: Secondary | ICD-10-CM | POA: Diagnosis not present

## 2017-06-19 DIAGNOSIS — Z713 Dietary counseling and surveillance: Secondary | ICD-10-CM | POA: Diagnosis not present

## 2017-06-19 DIAGNOSIS — H9202 Otalgia, left ear: Secondary | ICD-10-CM | POA: Diagnosis not present

## 2017-06-19 DIAGNOSIS — L538 Other specified erythematous conditions: Secondary | ICD-10-CM | POA: Diagnosis not present

## 2017-06-19 DIAGNOSIS — C099 Malignant neoplasm of tonsil, unspecified: Secondary | ICD-10-CM | POA: Diagnosis not present

## 2017-06-20 DIAGNOSIS — K1230 Oral mucositis (ulcerative), unspecified: Secondary | ICD-10-CM | POA: Diagnosis not present

## 2017-06-20 DIAGNOSIS — L538 Other specified erythematous conditions: Secondary | ICD-10-CM | POA: Diagnosis not present

## 2017-06-20 DIAGNOSIS — L598 Other specified disorders of the skin and subcutaneous tissue related to radiation: Secondary | ICD-10-CM | POA: Diagnosis not present

## 2017-06-20 DIAGNOSIS — H9313 Tinnitus, bilateral: Secondary | ICD-10-CM | POA: Diagnosis not present

## 2017-06-20 DIAGNOSIS — R110 Nausea: Secondary | ICD-10-CM | POA: Diagnosis not present

## 2017-06-20 DIAGNOSIS — C099 Malignant neoplasm of tonsil, unspecified: Secondary | ICD-10-CM | POA: Diagnosis not present

## 2017-06-20 DIAGNOSIS — H9202 Otalgia, left ear: Secondary | ICD-10-CM | POA: Diagnosis not present

## 2017-06-20 DIAGNOSIS — K117 Disturbances of salivary secretion: Secondary | ICD-10-CM | POA: Diagnosis not present

## 2017-06-20 DIAGNOSIS — E860 Dehydration: Secondary | ICD-10-CM | POA: Diagnosis not present

## 2017-06-20 DIAGNOSIS — Z510 Encounter for antineoplastic radiation therapy: Secondary | ICD-10-CM | POA: Diagnosis not present

## 2017-06-20 DIAGNOSIS — K219 Gastro-esophageal reflux disease without esophagitis: Secondary | ICD-10-CM | POA: Diagnosis not present

## 2017-06-20 DIAGNOSIS — Z6837 Body mass index (BMI) 37.0-37.9, adult: Secondary | ICD-10-CM | POA: Diagnosis not present

## 2017-06-20 DIAGNOSIS — Z713 Dietary counseling and surveillance: Secondary | ICD-10-CM | POA: Diagnosis not present

## 2017-06-21 DIAGNOSIS — K219 Gastro-esophageal reflux disease without esophagitis: Secondary | ICD-10-CM | POA: Diagnosis not present

## 2017-06-21 DIAGNOSIS — E860 Dehydration: Secondary | ICD-10-CM | POA: Diagnosis not present

## 2017-06-21 DIAGNOSIS — Z6837 Body mass index (BMI) 37.0-37.9, adult: Secondary | ICD-10-CM | POA: Diagnosis not present

## 2017-06-21 DIAGNOSIS — Z510 Encounter for antineoplastic radiation therapy: Secondary | ICD-10-CM | POA: Diagnosis not present

## 2017-06-21 DIAGNOSIS — K117 Disturbances of salivary secretion: Secondary | ICD-10-CM | POA: Diagnosis not present

## 2017-06-21 DIAGNOSIS — H9313 Tinnitus, bilateral: Secondary | ICD-10-CM | POA: Diagnosis not present

## 2017-06-21 DIAGNOSIS — R110 Nausea: Secondary | ICD-10-CM | POA: Diagnosis not present

## 2017-06-21 DIAGNOSIS — R070 Pain in throat: Secondary | ICD-10-CM | POA: Diagnosis not present

## 2017-06-21 DIAGNOSIS — C099 Malignant neoplasm of tonsil, unspecified: Secondary | ICD-10-CM | POA: Diagnosis not present

## 2017-06-21 DIAGNOSIS — L598 Other specified disorders of the skin and subcutaneous tissue related to radiation: Secondary | ICD-10-CM | POA: Diagnosis not present

## 2017-06-21 DIAGNOSIS — H9202 Otalgia, left ear: Secondary | ICD-10-CM | POA: Diagnosis not present

## 2017-06-21 DIAGNOSIS — Z5111 Encounter for antineoplastic chemotherapy: Secondary | ICD-10-CM | POA: Diagnosis not present

## 2017-06-21 DIAGNOSIS — Z713 Dietary counseling and surveillance: Secondary | ICD-10-CM | POA: Diagnosis not present

## 2017-06-21 DIAGNOSIS — K1230 Oral mucositis (ulcerative), unspecified: Secondary | ICD-10-CM | POA: Diagnosis not present

## 2017-06-21 DIAGNOSIS — C779 Secondary and unspecified malignant neoplasm of lymph node, unspecified: Secondary | ICD-10-CM | POA: Diagnosis not present

## 2017-06-21 DIAGNOSIS — L538 Other specified erythematous conditions: Secondary | ICD-10-CM | POA: Diagnosis not present

## 2017-06-23 DIAGNOSIS — Z6837 Body mass index (BMI) 37.0-37.9, adult: Secondary | ICD-10-CM | POA: Diagnosis not present

## 2017-06-23 DIAGNOSIS — K219 Gastro-esophageal reflux disease without esophagitis: Secondary | ICD-10-CM | POA: Diagnosis not present

## 2017-06-23 DIAGNOSIS — C099 Malignant neoplasm of tonsil, unspecified: Secondary | ICD-10-CM | POA: Diagnosis not present

## 2017-06-23 DIAGNOSIS — K117 Disturbances of salivary secretion: Secondary | ICD-10-CM | POA: Diagnosis not present

## 2017-06-23 DIAGNOSIS — L538 Other specified erythematous conditions: Secondary | ICD-10-CM | POA: Diagnosis not present

## 2017-06-23 DIAGNOSIS — H9202 Otalgia, left ear: Secondary | ICD-10-CM | POA: Diagnosis not present

## 2017-06-23 DIAGNOSIS — H9313 Tinnitus, bilateral: Secondary | ICD-10-CM | POA: Diagnosis not present

## 2017-06-23 DIAGNOSIS — K1230 Oral mucositis (ulcerative), unspecified: Secondary | ICD-10-CM | POA: Diagnosis not present

## 2017-06-23 DIAGNOSIS — C770 Secondary and unspecified malignant neoplasm of lymph nodes of head, face and neck: Secondary | ICD-10-CM | POA: Diagnosis not present

## 2017-06-23 DIAGNOSIS — L598 Other specified disorders of the skin and subcutaneous tissue related to radiation: Secondary | ICD-10-CM | POA: Diagnosis not present

## 2017-06-23 DIAGNOSIS — R110 Nausea: Secondary | ICD-10-CM | POA: Diagnosis not present

## 2017-06-23 DIAGNOSIS — Z510 Encounter for antineoplastic radiation therapy: Secondary | ICD-10-CM | POA: Diagnosis not present

## 2017-06-23 DIAGNOSIS — Z713 Dietary counseling and surveillance: Secondary | ICD-10-CM | POA: Diagnosis not present

## 2017-06-23 DIAGNOSIS — E860 Dehydration: Secondary | ICD-10-CM | POA: Diagnosis not present

## 2017-06-24 DIAGNOSIS — E860 Dehydration: Secondary | ICD-10-CM | POA: Diagnosis not present

## 2017-06-24 DIAGNOSIS — L538 Other specified erythematous conditions: Secondary | ICD-10-CM | POA: Diagnosis not present

## 2017-06-24 DIAGNOSIS — Z713 Dietary counseling and surveillance: Secondary | ICD-10-CM | POA: Diagnosis not present

## 2017-06-24 DIAGNOSIS — L598 Other specified disorders of the skin and subcutaneous tissue related to radiation: Secondary | ICD-10-CM | POA: Diagnosis not present

## 2017-06-24 DIAGNOSIS — K1230 Oral mucositis (ulcerative), unspecified: Secondary | ICD-10-CM | POA: Diagnosis not present

## 2017-06-24 DIAGNOSIS — Z5111 Encounter for antineoplastic chemotherapy: Secondary | ICD-10-CM | POA: Diagnosis not present

## 2017-06-24 DIAGNOSIS — Z510 Encounter for antineoplastic radiation therapy: Secondary | ICD-10-CM | POA: Diagnosis not present

## 2017-06-24 DIAGNOSIS — R110 Nausea: Secondary | ICD-10-CM | POA: Diagnosis not present

## 2017-06-24 DIAGNOSIS — R1310 Dysphagia, unspecified: Secondary | ICD-10-CM | POA: Diagnosis not present

## 2017-06-24 DIAGNOSIS — Z6837 Body mass index (BMI) 37.0-37.9, adult: Secondary | ICD-10-CM | POA: Diagnosis not present

## 2017-06-24 DIAGNOSIS — R634 Abnormal weight loss: Secondary | ICD-10-CM | POA: Diagnosis not present

## 2017-06-24 DIAGNOSIS — H9313 Tinnitus, bilateral: Secondary | ICD-10-CM | POA: Diagnosis not present

## 2017-06-24 DIAGNOSIS — Z6835 Body mass index (BMI) 35.0-35.9, adult: Secondary | ICD-10-CM | POA: Diagnosis not present

## 2017-06-24 DIAGNOSIS — K1379 Other lesions of oral mucosa: Secondary | ICD-10-CM | POA: Diagnosis not present

## 2017-06-24 DIAGNOSIS — J029 Acute pharyngitis, unspecified: Secondary | ICD-10-CM | POA: Diagnosis not present

## 2017-06-24 DIAGNOSIS — R432 Parageusia: Secondary | ICD-10-CM | POA: Diagnosis not present

## 2017-06-24 DIAGNOSIS — K117 Disturbances of salivary secretion: Secondary | ICD-10-CM | POA: Diagnosis not present

## 2017-06-24 DIAGNOSIS — R682 Dry mouth, unspecified: Secondary | ICD-10-CM | POA: Diagnosis not present

## 2017-06-24 DIAGNOSIS — H9202 Otalgia, left ear: Secondary | ICD-10-CM | POA: Diagnosis not present

## 2017-06-24 DIAGNOSIS — K219 Gastro-esophageal reflux disease without esophagitis: Secondary | ICD-10-CM | POA: Diagnosis not present

## 2017-06-24 DIAGNOSIS — C099 Malignant neoplasm of tonsil, unspecified: Secondary | ICD-10-CM | POA: Diagnosis not present

## 2017-06-24 DIAGNOSIS — C770 Secondary and unspecified malignant neoplasm of lymph nodes of head, face and neck: Secondary | ICD-10-CM | POA: Diagnosis not present

## 2017-06-25 DIAGNOSIS — K1230 Oral mucositis (ulcerative), unspecified: Secondary | ICD-10-CM | POA: Diagnosis not present

## 2017-06-25 DIAGNOSIS — K117 Disturbances of salivary secretion: Secondary | ICD-10-CM | POA: Diagnosis not present

## 2017-06-25 DIAGNOSIS — L538 Other specified erythematous conditions: Secondary | ICD-10-CM | POA: Diagnosis not present

## 2017-06-25 DIAGNOSIS — H9202 Otalgia, left ear: Secondary | ICD-10-CM | POA: Diagnosis not present

## 2017-06-25 DIAGNOSIS — C770 Secondary and unspecified malignant neoplasm of lymph nodes of head, face and neck: Secondary | ICD-10-CM | POA: Diagnosis not present

## 2017-06-25 DIAGNOSIS — Z713 Dietary counseling and surveillance: Secondary | ICD-10-CM | POA: Diagnosis not present

## 2017-06-25 DIAGNOSIS — L598 Other specified disorders of the skin and subcutaneous tissue related to radiation: Secondary | ICD-10-CM | POA: Diagnosis not present

## 2017-06-25 DIAGNOSIS — H9313 Tinnitus, bilateral: Secondary | ICD-10-CM | POA: Diagnosis not present

## 2017-06-25 DIAGNOSIS — R110 Nausea: Secondary | ICD-10-CM | POA: Diagnosis not present

## 2017-06-25 DIAGNOSIS — K219 Gastro-esophageal reflux disease without esophagitis: Secondary | ICD-10-CM | POA: Diagnosis not present

## 2017-06-25 DIAGNOSIS — Z510 Encounter for antineoplastic radiation therapy: Secondary | ICD-10-CM | POA: Diagnosis not present

## 2017-06-25 DIAGNOSIS — Z6837 Body mass index (BMI) 37.0-37.9, adult: Secondary | ICD-10-CM | POA: Diagnosis not present

## 2017-06-25 DIAGNOSIS — E860 Dehydration: Secondary | ICD-10-CM | POA: Diagnosis not present

## 2017-06-25 DIAGNOSIS — C099 Malignant neoplasm of tonsil, unspecified: Secondary | ICD-10-CM | POA: Diagnosis not present

## 2017-06-26 DIAGNOSIS — H9202 Otalgia, left ear: Secondary | ICD-10-CM | POA: Diagnosis not present

## 2017-06-26 DIAGNOSIS — K219 Gastro-esophageal reflux disease without esophagitis: Secondary | ICD-10-CM | POA: Diagnosis not present

## 2017-06-26 DIAGNOSIS — K117 Disturbances of salivary secretion: Secondary | ICD-10-CM | POA: Diagnosis not present

## 2017-06-26 DIAGNOSIS — Z6837 Body mass index (BMI) 37.0-37.9, adult: Secondary | ICD-10-CM | POA: Diagnosis not present

## 2017-06-26 DIAGNOSIS — R110 Nausea: Secondary | ICD-10-CM | POA: Diagnosis not present

## 2017-06-26 DIAGNOSIS — E860 Dehydration: Secondary | ICD-10-CM | POA: Diagnosis not present

## 2017-06-26 DIAGNOSIS — C779 Secondary and unspecified malignant neoplasm of lymph node, unspecified: Secondary | ICD-10-CM | POA: Diagnosis not present

## 2017-06-26 DIAGNOSIS — H9313 Tinnitus, bilateral: Secondary | ICD-10-CM | POA: Diagnosis not present

## 2017-06-26 DIAGNOSIS — Z713 Dietary counseling and surveillance: Secondary | ICD-10-CM | POA: Diagnosis not present

## 2017-06-26 DIAGNOSIS — C770 Secondary and unspecified malignant neoplasm of lymph nodes of head, face and neck: Secondary | ICD-10-CM | POA: Diagnosis not present

## 2017-06-26 DIAGNOSIS — K1230 Oral mucositis (ulcerative), unspecified: Secondary | ICD-10-CM | POA: Diagnosis not present

## 2017-06-26 DIAGNOSIS — Z510 Encounter for antineoplastic radiation therapy: Secondary | ICD-10-CM | POA: Diagnosis not present

## 2017-06-26 DIAGNOSIS — L538 Other specified erythematous conditions: Secondary | ICD-10-CM | POA: Diagnosis not present

## 2017-06-26 DIAGNOSIS — C099 Malignant neoplasm of tonsil, unspecified: Secondary | ICD-10-CM | POA: Diagnosis not present

## 2017-06-26 DIAGNOSIS — L598 Other specified disorders of the skin and subcutaneous tissue related to radiation: Secondary | ICD-10-CM | POA: Diagnosis not present

## 2017-06-26 DIAGNOSIS — Z5111 Encounter for antineoplastic chemotherapy: Secondary | ICD-10-CM | POA: Diagnosis not present

## 2017-07-01 DIAGNOSIS — Z6836 Body mass index (BMI) 36.0-36.9, adult: Secondary | ICD-10-CM | POA: Diagnosis not present

## 2017-07-01 DIAGNOSIS — R110 Nausea: Secondary | ICD-10-CM | POA: Diagnosis not present

## 2017-07-01 DIAGNOSIS — R438 Other disturbances of smell and taste: Secondary | ICD-10-CM | POA: Diagnosis not present

## 2017-07-01 DIAGNOSIS — K1230 Oral mucositis (ulcerative), unspecified: Secondary | ICD-10-CM | POA: Diagnosis not present

## 2017-07-01 DIAGNOSIS — K117 Disturbances of salivary secretion: Secondary | ICD-10-CM | POA: Diagnosis not present

## 2017-07-01 DIAGNOSIS — R1310 Dysphagia, unspecified: Secondary | ICD-10-CM | POA: Diagnosis not present

## 2017-07-01 DIAGNOSIS — Z713 Dietary counseling and surveillance: Secondary | ICD-10-CM | POA: Diagnosis not present

## 2017-07-01 DIAGNOSIS — R630 Anorexia: Secondary | ICD-10-CM | POA: Diagnosis not present

## 2017-07-01 DIAGNOSIS — R634 Abnormal weight loss: Secondary | ICD-10-CM | POA: Diagnosis not present

## 2017-07-01 DIAGNOSIS — R682 Dry mouth, unspecified: Secondary | ICD-10-CM | POA: Diagnosis not present

## 2017-07-01 DIAGNOSIS — Z510 Encounter for antineoplastic radiation therapy: Secondary | ICD-10-CM | POA: Diagnosis not present

## 2017-07-01 DIAGNOSIS — C770 Secondary and unspecified malignant neoplasm of lymph nodes of head, face and neck: Secondary | ICD-10-CM | POA: Diagnosis not present

## 2017-07-01 DIAGNOSIS — H9313 Tinnitus, bilateral: Secondary | ICD-10-CM | POA: Diagnosis not present

## 2017-07-01 DIAGNOSIS — K219 Gastro-esophageal reflux disease without esophagitis: Secondary | ICD-10-CM | POA: Diagnosis not present

## 2017-07-01 DIAGNOSIS — L538 Other specified erythematous conditions: Secondary | ICD-10-CM | POA: Diagnosis not present

## 2017-07-01 DIAGNOSIS — K1379 Other lesions of oral mucosa: Secondary | ICD-10-CM | POA: Diagnosis not present

## 2017-07-01 DIAGNOSIS — E860 Dehydration: Secondary | ICD-10-CM | POA: Diagnosis not present

## 2017-07-01 DIAGNOSIS — L598 Other specified disorders of the skin and subcutaneous tissue related to radiation: Secondary | ICD-10-CM | POA: Diagnosis not present

## 2017-07-01 DIAGNOSIS — C099 Malignant neoplasm of tonsil, unspecified: Secondary | ICD-10-CM | POA: Diagnosis not present

## 2017-07-01 DIAGNOSIS — C779 Secondary and unspecified malignant neoplasm of lymph node, unspecified: Secondary | ICD-10-CM | POA: Diagnosis not present

## 2017-07-01 DIAGNOSIS — Z5111 Encounter for antineoplastic chemotherapy: Secondary | ICD-10-CM | POA: Diagnosis not present

## 2017-07-01 DIAGNOSIS — J029 Acute pharyngitis, unspecified: Secondary | ICD-10-CM | POA: Diagnosis not present

## 2017-07-01 DIAGNOSIS — Z6837 Body mass index (BMI) 37.0-37.9, adult: Secondary | ICD-10-CM | POA: Diagnosis not present

## 2017-07-01 DIAGNOSIS — H9202 Otalgia, left ear: Secondary | ICD-10-CM | POA: Diagnosis not present

## 2017-07-02 DIAGNOSIS — L538 Other specified erythematous conditions: Secondary | ICD-10-CM | POA: Diagnosis not present

## 2017-07-02 DIAGNOSIS — H9313 Tinnitus, bilateral: Secondary | ICD-10-CM | POA: Diagnosis not present

## 2017-07-02 DIAGNOSIS — H9202 Otalgia, left ear: Secondary | ICD-10-CM | POA: Diagnosis not present

## 2017-07-02 DIAGNOSIS — Z713 Dietary counseling and surveillance: Secondary | ICD-10-CM | POA: Diagnosis not present

## 2017-07-02 DIAGNOSIS — Z510 Encounter for antineoplastic radiation therapy: Secondary | ICD-10-CM | POA: Diagnosis not present

## 2017-07-02 DIAGNOSIS — Z6837 Body mass index (BMI) 37.0-37.9, adult: Secondary | ICD-10-CM | POA: Diagnosis not present

## 2017-07-02 DIAGNOSIS — E860 Dehydration: Secondary | ICD-10-CM | POA: Diagnosis not present

## 2017-07-02 DIAGNOSIS — K117 Disturbances of salivary secretion: Secondary | ICD-10-CM | POA: Diagnosis not present

## 2017-07-02 DIAGNOSIS — C770 Secondary and unspecified malignant neoplasm of lymph nodes of head, face and neck: Secondary | ICD-10-CM | POA: Diagnosis not present

## 2017-07-02 DIAGNOSIS — K1230 Oral mucositis (ulcerative), unspecified: Secondary | ICD-10-CM | POA: Diagnosis not present

## 2017-07-02 DIAGNOSIS — K219 Gastro-esophageal reflux disease without esophagitis: Secondary | ICD-10-CM | POA: Diagnosis not present

## 2017-07-02 DIAGNOSIS — L598 Other specified disorders of the skin and subcutaneous tissue related to radiation: Secondary | ICD-10-CM | POA: Diagnosis not present

## 2017-07-02 DIAGNOSIS — C099 Malignant neoplasm of tonsil, unspecified: Secondary | ICD-10-CM | POA: Diagnosis not present

## 2017-07-02 DIAGNOSIS — R110 Nausea: Secondary | ICD-10-CM | POA: Diagnosis not present

## 2017-07-03 DIAGNOSIS — H9202 Otalgia, left ear: Secondary | ICD-10-CM | POA: Diagnosis not present

## 2017-07-03 DIAGNOSIS — R110 Nausea: Secondary | ICD-10-CM | POA: Diagnosis not present

## 2017-07-03 DIAGNOSIS — K1230 Oral mucositis (ulcerative), unspecified: Secondary | ICD-10-CM | POA: Diagnosis not present

## 2017-07-03 DIAGNOSIS — C099 Malignant neoplasm of tonsil, unspecified: Secondary | ICD-10-CM | POA: Diagnosis not present

## 2017-07-03 DIAGNOSIS — L598 Other specified disorders of the skin and subcutaneous tissue related to radiation: Secondary | ICD-10-CM | POA: Diagnosis not present

## 2017-07-03 DIAGNOSIS — E860 Dehydration: Secondary | ICD-10-CM | POA: Diagnosis not present

## 2017-07-03 DIAGNOSIS — C770 Secondary and unspecified malignant neoplasm of lymph nodes of head, face and neck: Secondary | ICD-10-CM | POA: Diagnosis not present

## 2017-07-03 DIAGNOSIS — Z510 Encounter for antineoplastic radiation therapy: Secondary | ICD-10-CM | POA: Diagnosis not present

## 2017-07-03 DIAGNOSIS — Z6837 Body mass index (BMI) 37.0-37.9, adult: Secondary | ICD-10-CM | POA: Diagnosis not present

## 2017-07-03 DIAGNOSIS — L538 Other specified erythematous conditions: Secondary | ICD-10-CM | POA: Diagnosis not present

## 2017-07-03 DIAGNOSIS — Z713 Dietary counseling and surveillance: Secondary | ICD-10-CM | POA: Diagnosis not present

## 2017-07-03 DIAGNOSIS — K117 Disturbances of salivary secretion: Secondary | ICD-10-CM | POA: Diagnosis not present

## 2017-07-03 DIAGNOSIS — H9313 Tinnitus, bilateral: Secondary | ICD-10-CM | POA: Diagnosis not present

## 2017-07-03 DIAGNOSIS — K219 Gastro-esophageal reflux disease without esophagitis: Secondary | ICD-10-CM | POA: Diagnosis not present

## 2017-07-04 DIAGNOSIS — H9202 Otalgia, left ear: Secondary | ICD-10-CM | POA: Diagnosis not present

## 2017-07-04 DIAGNOSIS — Z510 Encounter for antineoplastic radiation therapy: Secondary | ICD-10-CM | POA: Diagnosis not present

## 2017-07-04 DIAGNOSIS — L538 Other specified erythematous conditions: Secondary | ICD-10-CM | POA: Diagnosis not present

## 2017-07-04 DIAGNOSIS — K1230 Oral mucositis (ulcerative), unspecified: Secondary | ICD-10-CM | POA: Diagnosis not present

## 2017-07-04 DIAGNOSIS — C770 Secondary and unspecified malignant neoplasm of lymph nodes of head, face and neck: Secondary | ICD-10-CM | POA: Diagnosis not present

## 2017-07-04 DIAGNOSIS — Z6837 Body mass index (BMI) 37.0-37.9, adult: Secondary | ICD-10-CM | POA: Diagnosis not present

## 2017-07-04 DIAGNOSIS — I480 Paroxysmal atrial fibrillation: Secondary | ICD-10-CM | POA: Diagnosis not present

## 2017-07-04 DIAGNOSIS — K117 Disturbances of salivary secretion: Secondary | ICD-10-CM | POA: Diagnosis not present

## 2017-07-04 DIAGNOSIS — Z713 Dietary counseling and surveillance: Secondary | ICD-10-CM | POA: Diagnosis not present

## 2017-07-04 DIAGNOSIS — E860 Dehydration: Secondary | ICD-10-CM | POA: Diagnosis not present

## 2017-07-04 DIAGNOSIS — H9313 Tinnitus, bilateral: Secondary | ICD-10-CM | POA: Diagnosis not present

## 2017-07-04 DIAGNOSIS — L598 Other specified disorders of the skin and subcutaneous tissue related to radiation: Secondary | ICD-10-CM | POA: Diagnosis not present

## 2017-07-04 DIAGNOSIS — R110 Nausea: Secondary | ICD-10-CM | POA: Diagnosis not present

## 2017-07-04 DIAGNOSIS — K219 Gastro-esophageal reflux disease without esophagitis: Secondary | ICD-10-CM | POA: Diagnosis not present

## 2017-07-04 DIAGNOSIS — C099 Malignant neoplasm of tonsil, unspecified: Secondary | ICD-10-CM | POA: Diagnosis not present

## 2017-07-05 ENCOUNTER — Encounter: Payer: BLUE CROSS/BLUE SHIELD | Admitting: Internal Medicine

## 2017-07-05 DIAGNOSIS — H9313 Tinnitus, bilateral: Secondary | ICD-10-CM | POA: Diagnosis not present

## 2017-07-05 DIAGNOSIS — K219 Gastro-esophageal reflux disease without esophagitis: Secondary | ICD-10-CM | POA: Diagnosis not present

## 2017-07-05 DIAGNOSIS — Z5111 Encounter for antineoplastic chemotherapy: Secondary | ICD-10-CM | POA: Diagnosis not present

## 2017-07-05 DIAGNOSIS — R110 Nausea: Secondary | ICD-10-CM | POA: Diagnosis not present

## 2017-07-05 DIAGNOSIS — C099 Malignant neoplasm of tonsil, unspecified: Secondary | ICD-10-CM | POA: Diagnosis not present

## 2017-07-05 DIAGNOSIS — R070 Pain in throat: Secondary | ICD-10-CM | POA: Diagnosis not present

## 2017-07-05 DIAGNOSIS — Z510 Encounter for antineoplastic radiation therapy: Secondary | ICD-10-CM | POA: Diagnosis not present

## 2017-07-05 DIAGNOSIS — C770 Secondary and unspecified malignant neoplasm of lymph nodes of head, face and neck: Secondary | ICD-10-CM | POA: Diagnosis not present

## 2017-07-05 DIAGNOSIS — Z713 Dietary counseling and surveillance: Secondary | ICD-10-CM | POA: Diagnosis not present

## 2017-07-05 DIAGNOSIS — C779 Secondary and unspecified malignant neoplasm of lymph node, unspecified: Secondary | ICD-10-CM | POA: Diagnosis not present

## 2017-07-05 DIAGNOSIS — L538 Other specified erythematous conditions: Secondary | ICD-10-CM | POA: Diagnosis not present

## 2017-07-05 DIAGNOSIS — L598 Other specified disorders of the skin and subcutaneous tissue related to radiation: Secondary | ICD-10-CM | POA: Diagnosis not present

## 2017-07-05 DIAGNOSIS — H9202 Otalgia, left ear: Secondary | ICD-10-CM | POA: Diagnosis not present

## 2017-07-05 DIAGNOSIS — E860 Dehydration: Secondary | ICD-10-CM | POA: Diagnosis not present

## 2017-07-05 DIAGNOSIS — K1230 Oral mucositis (ulcerative), unspecified: Secondary | ICD-10-CM | POA: Diagnosis not present

## 2017-07-05 DIAGNOSIS — Z6837 Body mass index (BMI) 37.0-37.9, adult: Secondary | ICD-10-CM | POA: Diagnosis not present

## 2017-07-05 DIAGNOSIS — K117 Disturbances of salivary secretion: Secondary | ICD-10-CM | POA: Diagnosis not present

## 2017-07-08 DIAGNOSIS — C770 Secondary and unspecified malignant neoplasm of lymph nodes of head, face and neck: Secondary | ICD-10-CM | POA: Diagnosis not present

## 2017-07-08 DIAGNOSIS — Z5111 Encounter for antineoplastic chemotherapy: Secondary | ICD-10-CM | POA: Diagnosis not present

## 2017-07-09 DIAGNOSIS — C770 Secondary and unspecified malignant neoplasm of lymph nodes of head, face and neck: Secondary | ICD-10-CM | POA: Diagnosis not present

## 2017-07-10 DIAGNOSIS — C770 Secondary and unspecified malignant neoplasm of lymph nodes of head, face and neck: Secondary | ICD-10-CM | POA: Diagnosis not present

## 2017-07-11 DIAGNOSIS — C770 Secondary and unspecified malignant neoplasm of lymph nodes of head, face and neck: Secondary | ICD-10-CM | POA: Diagnosis not present

## 2017-07-12 DIAGNOSIS — C770 Secondary and unspecified malignant neoplasm of lymph nodes of head, face and neck: Secondary | ICD-10-CM | POA: Diagnosis not present

## 2017-07-15 DIAGNOSIS — C770 Secondary and unspecified malignant neoplasm of lymph nodes of head, face and neck: Secondary | ICD-10-CM | POA: Diagnosis not present

## 2017-07-15 DIAGNOSIS — Z5111 Encounter for antineoplastic chemotherapy: Secondary | ICD-10-CM | POA: Diagnosis not present

## 2017-07-16 DIAGNOSIS — C099 Malignant neoplasm of tonsil, unspecified: Secondary | ICD-10-CM | POA: Diagnosis not present

## 2017-07-17 DIAGNOSIS — C099 Malignant neoplasm of tonsil, unspecified: Secondary | ICD-10-CM | POA: Diagnosis not present

## 2017-07-18 DIAGNOSIS — C099 Malignant neoplasm of tonsil, unspecified: Secondary | ICD-10-CM | POA: Diagnosis not present

## 2017-07-19 DIAGNOSIS — C099 Malignant neoplasm of tonsil, unspecified: Secondary | ICD-10-CM | POA: Diagnosis not present

## 2017-07-22 DIAGNOSIS — C099 Malignant neoplasm of tonsil, unspecified: Secondary | ICD-10-CM | POA: Diagnosis not present

## 2017-07-22 DIAGNOSIS — R05 Cough: Secondary | ICD-10-CM | POA: Diagnosis not present

## 2017-07-22 DIAGNOSIS — C770 Secondary and unspecified malignant neoplasm of lymph nodes of head, face and neck: Secondary | ICD-10-CM | POA: Diagnosis not present

## 2017-07-22 DIAGNOSIS — D649 Anemia, unspecified: Secondary | ICD-10-CM | POA: Diagnosis not present

## 2017-07-22 DIAGNOSIS — R509 Fever, unspecified: Secondary | ICD-10-CM | POA: Diagnosis not present

## 2017-07-25 DIAGNOSIS — R509 Fever, unspecified: Secondary | ICD-10-CM | POA: Diagnosis not present

## 2017-07-25 DIAGNOSIS — C099 Malignant neoplasm of tonsil, unspecified: Secondary | ICD-10-CM | POA: Diagnosis not present

## 2017-07-26 ENCOUNTER — Ambulatory Visit (INDEPENDENT_AMBULATORY_CARE_PROVIDER_SITE_OTHER): Payer: BLUE CROSS/BLUE SHIELD | Admitting: Podiatry

## 2017-07-26 ENCOUNTER — Encounter: Payer: Self-pay | Admitting: Podiatry

## 2017-07-26 DIAGNOSIS — E860 Dehydration: Secondary | ICD-10-CM | POA: Diagnosis not present

## 2017-07-26 DIAGNOSIS — M109 Gout, unspecified: Secondary | ICD-10-CM

## 2017-07-26 DIAGNOSIS — M25572 Pain in left ankle and joints of left foot: Secondary | ICD-10-CM

## 2017-07-26 DIAGNOSIS — C099 Malignant neoplasm of tonsil, unspecified: Secondary | ICD-10-CM | POA: Diagnosis not present

## 2017-07-27 NOTE — Progress Notes (Signed)
This patient presents to the office with chief complaint of a painful left ankle.  Patient states he has been walking with no problem last night but woke up with severe ankle pain.  He says he has been having difficulty bearing weight on his left ankle.  This patient has previously been diagnosed and treated with gout.  He presently is on allopurinol and xarelto.  He says he has a history of gout and has been taking colchicine through the morning.  He now presents the office stating he has extreme pain and discomfort in his left ankle.   Patient was treated by Dr. Amalia Hailey in April 2018 for acute gout left ankle.  He was treated with injection therapy at that visit.  He presents the office today for continued evaluation of his left ankle.   General Appearance  Alert, conversant and in no acute stress.  Vascular  Dorsalis pedis and posterior pulses are palpable  bilaterally.  Capillary return is within normal limits  Bilaterally. Temperature is within normal limits  Bilaterally  Neurologic  Senn-Weinstein monofilament wire test within normal limits  bilaterally. Muscle power  Within normal limits bilaterally.  Nails Thick disfigured discolored nails with subungual debride bilaterally from hallux to fifth toes bilaterally. No evidence of bacterial infection or drainage bilaterally.  Orthopedic  No limitations of motion of motion feet bilaterally.  No crepitus or effusions noted.  No bony pathology or digital deformities noted. Patient has palpable pain at the anterior aspect of the left ankle with mild swelling noted to the ankle.  The ankle is not red and hot and inflamed.    Skin  normotropic skin with no porokeratosis noted bilaterally.  No signs of infections or ulcers noted.    Gout left ankle  ROV  Treated with injection therapy.  Injection therapy using 1.0 cc. Of 2% xylocaine( 20 mg.) plus 1 cc. of kenalog-la ( 10 mg) plus 1/2 cc. of dexamethazone phosphate ( 2 mg).  Patient was told to take  colchicine when necessary.  Patient was instructed if the problem persists to make an appointment and be evaluated by Dr. Amalia Hailey.   Gardiner Barefoot DPM

## 2017-07-28 ENCOUNTER — Other Ambulatory Visit: Payer: Self-pay | Admitting: Internal Medicine

## 2017-08-01 DIAGNOSIS — Z08 Encounter for follow-up examination after completed treatment for malignant neoplasm: Secondary | ICD-10-CM | POA: Diagnosis not present

## 2017-08-12 DIAGNOSIS — C779 Secondary and unspecified malignant neoplasm of lymph node, unspecified: Secondary | ICD-10-CM | POA: Diagnosis not present

## 2017-08-22 ENCOUNTER — Other Ambulatory Visit: Payer: Self-pay | Admitting: *Deleted

## 2017-08-22 MED ORDER — BENAZEPRIL HCL 10 MG PO TABS: 10.0000 mg | ORAL_TABLET | Freq: Every day | ORAL | 3 refills | Status: DC

## 2017-08-29 DIAGNOSIS — Z08 Encounter for follow-up examination after completed treatment for malignant neoplasm: Secondary | ICD-10-CM | POA: Diagnosis not present

## 2017-08-29 DIAGNOSIS — C770 Secondary and unspecified malignant neoplasm of lymph nodes of head, face and neck: Secondary | ICD-10-CM | POA: Diagnosis not present

## 2017-08-29 DIAGNOSIS — R432 Parageusia: Secondary | ICD-10-CM | POA: Diagnosis not present

## 2017-08-29 DIAGNOSIS — J3489 Other specified disorders of nose and nasal sinuses: Secondary | ICD-10-CM | POA: Diagnosis not present

## 2017-08-29 DIAGNOSIS — C099 Malignant neoplasm of tonsil, unspecified: Secondary | ICD-10-CM | POA: Diagnosis not present

## 2017-08-29 DIAGNOSIS — R5383 Other fatigue: Secondary | ICD-10-CM | POA: Diagnosis not present

## 2017-08-29 DIAGNOSIS — R070 Pain in throat: Secondary | ICD-10-CM | POA: Diagnosis not present

## 2017-08-29 DIAGNOSIS — Z923 Personal history of irradiation: Secondary | ICD-10-CM | POA: Diagnosis not present

## 2017-08-29 DIAGNOSIS — K117 Disturbances of salivary secretion: Secondary | ICD-10-CM | POA: Diagnosis not present

## 2017-08-29 DIAGNOSIS — Z85818 Personal history of malignant neoplasm of other sites of lip, oral cavity, and pharynx: Secondary | ICD-10-CM | POA: Diagnosis not present

## 2017-09-13 DIAGNOSIS — C779 Secondary and unspecified malignant neoplasm of lymph node, unspecified: Secondary | ICD-10-CM | POA: Diagnosis not present

## 2017-09-13 DIAGNOSIS — Z5111 Encounter for antineoplastic chemotherapy: Secondary | ICD-10-CM | POA: Diagnosis not present

## 2017-09-13 DIAGNOSIS — Z923 Personal history of irradiation: Secondary | ICD-10-CM | POA: Diagnosis not present

## 2017-09-13 DIAGNOSIS — C099 Malignant neoplasm of tonsil, unspecified: Secondary | ICD-10-CM | POA: Diagnosis not present

## 2017-09-19 ENCOUNTER — Ambulatory Visit: Payer: BLUE CROSS/BLUE SHIELD | Admitting: Internal Medicine

## 2017-09-26 ENCOUNTER — Encounter: Payer: Self-pay | Admitting: Internal Medicine

## 2017-09-26 ENCOUNTER — Ambulatory Visit (INDEPENDENT_AMBULATORY_CARE_PROVIDER_SITE_OTHER): Payer: BLUE CROSS/BLUE SHIELD | Admitting: Internal Medicine

## 2017-09-26 VITALS — BP 122/84 | HR 73 | Ht 70.5 in | Wt 231.6 lb

## 2017-09-26 DIAGNOSIS — I428 Other cardiomyopathies: Secondary | ICD-10-CM | POA: Diagnosis not present

## 2017-09-26 DIAGNOSIS — I493 Ventricular premature depolarization: Secondary | ICD-10-CM

## 2017-09-26 DIAGNOSIS — I480 Paroxysmal atrial fibrillation: Secondary | ICD-10-CM | POA: Diagnosis not present

## 2017-09-26 NOTE — Progress Notes (Signed)
Patient Care Team: Venia Carbon, MD as PCP - General   HPI  [name] is a 52 y.o. male Seen in followup for atrial fibrillation that was newly identified 2012.  He was found to have a cardiomyopathy.  Evaluation was also concerning for a bicuspid aortic valve this was subsequently clarified as being trileaflet  DATE TEST    2012    Echo   EF 30 %   ? bicuspid  10/13    Echo   EF 30-35 %   trileaflet  10/15    MRI EF 43 %   No LGE  2/18 Echo  EF 40-45%    In October he was diagnosed with squamous cell cancer associated with lymphadenopathy.  He underwent radiation and chemotherapy which is left him with some less energy.  Last evaluation there is no visual tumor.  Her first PET scan is due 3/21.  No significant palpitations  No SOB or edema symptoms text to Frederico Hamman was a treatment 15 today?  How is your mouth?    Date Cr Hgb  10/18 1.02 13.3  2/19 0.9 11.5       Past Medical History:  Diagnosis Date  . Anxiety   . Arthritis    gout  . Atrial fibrillation ([place]) 1989  . Bicuspid aortic valve    Last echo looks trileaflet  . Cardiomyopathy 08/2009   Mildly decreased EF  . Depression   . GERD (gastroesophageal reflux disease)   . Gout   . 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 Hyperlipidemia   . Hypertension   . Seizures ([place]) 1998   with heat stroke in the [employer] -none since    Past Surgical History:  Procedure Laterality Date  . CARDIAC CATHETERIZATION  03/2010   No sig coronary disease, EF ~40%  . CARDIOVERSION  1989  . TOTAL KNEE ARTHROPLASTY Left     Current Outpatient Medications  Medication Sig Dispense Refill  . allopurinol (ZYLOPRIM) 100 MG tablet take 2 tablets by mouth once daily 180 tablet 3  . allopurinol (ZYLOPRIM) 300 MG tablet take 1 tablet by mouth once daily 90 tablet 0  . atorvastatin (LIPITOR) 20 MG tablet take 1 tablet by mouth once daily 90 tablet 2  . benazepril  (LOTENSIN) 10 MG tablet Take 1 tablet (10 mg total) by mouth daily. 30 tablet 3  . carvedilol (COREG) 12.5 MG tablet take 1 tablet by mouth twice a day with food 60 tablet 0  . colchicine 0.6 MG tablet Take 1 tablet (0.6 mg total) by mouth daily. (Patient taking differently: Take 0.6 mg by mouth daily as needed. ) 30 tablet 0  . FLUoxetine (PROZAC) 20 MG capsule take 3 capsules by mouth once daily 270 capsule 0  . HYDROcodone-acetaminophen (NORCO/VICODIN) 5-325 MG tablet take 1 to 2 tablets by mouth every 4 hours if needed  0  . Ibuprofen 200 MG CAPS Take 800 mg by mouth 2 (two) times daily as needed.     . Melatonin 5 MG CAPS Take 10 mg by mouth at bedtime. Takes 2 capsules po at bedtime for sleep.    . Multiple Vitamin (MULTIVITAMIN) tablet Take 1 tablet by mouth daily.    . vitamin C (ASCORBIC ACID) 500 MG tablet Take 500 mg by mouth daily.     Alveda Reasons 20 MG TABS tablet take 1 tablet by mouth once daily 30 tablet  5   No current facility-administered medications for this visit.     No Known Allergies  Review of Systems negative except from HPI and PMH  Physical Exam BP 122/84   Pulse 73   Ht 5' 10.5" (1.791 m)   Wt 231 lb 9.6 oz (105.1 kg)   SpO2 98%   BMI 32.76 kg/m  Well developed and nourished in no acute distress HENT normal Neck supple with JVP-flat Clear Regular rate and rhythm, no murmurs or gallops Abd-soft with active BS No Clubbing cyanosis edema Skin-warm and dry A & Oriented  Grossly normal sensory and motor function v ECG demonstrates  sinuis 73 18/08/38    Assessment and  Plan  PVCs  Atrial Fibrillation   Cardiomyopathy--nonischemic  Hyperlipidemia  Squamous cell cancer of the head and neck with recent chemotherapy    On Anticoagulation;  No bleeding issues   Will check an echocardiogram following his chemotherapy  No symptomatic PVCs  He would like to stop his statins.  We will hold them for 1 month and then check his fasting lipid  profile.  We will then use the ACC risk calculator and he can make a determination as to whether the benefit is worth it  We spent more than 50% of our >25 min visit in face to face counseling regarding the above

## 2017-09-26 NOTE — Patient Instructions (Signed)
Medication Instructions:  Your physician recommends that you continue on your current medications as directed. Please refer to the Current Medication list given to you today.  Labwork: None ordered.  Testing/Procedures:  Your physician has requested that you have an echocardiogram. Echocardiography is a painless test that uses sound waves to create images of your heart. It provides your doctor with information about the size and shape of your heart and how well your heart's chambers and valves are working. This procedure takes approximately one hour. There are no restrictions for this procedure.    Follow-Up: Your physician recommends that you schedule a follow-up appointment in: One Year with Dr Caryl Comes in [place].  Any Other Special Instructions Will Be Listed Below (If Applicable).     If you need a refill on your cardiac medications before your next appointment, please call your pharmacy.

## 2017-09-27 DIAGNOSIS — M19012 Primary osteoarthritis, left shoulder: Secondary | ICD-10-CM | POA: Diagnosis not present

## 2017-09-27 DIAGNOSIS — I480 Paroxysmal atrial fibrillation: Secondary | ICD-10-CM | POA: Diagnosis not present

## 2017-09-29 ENCOUNTER — Telehealth: Payer: Self-pay | Admitting: Internal Medicine

## 2017-09-30 ENCOUNTER — Other Ambulatory Visit: Payer: Self-pay | Admitting: Podiatry

## 2017-09-30 NOTE — Telephone Encounter (Signed)
Pharmacy did not receive per patient. Please advise

## 2017-09-30 NOTE — Telephone Encounter (Signed)
Called [facility] pharmacy re: question of receiving the Fluoxetine Rx today.  Was advised that the pt. Was dispensed a 90 day supply of this medication on 07/29/17, and the insurance will not authorize a refill until 10/04/17.  Attempted to notify the pt., of the above; left voice message re: need to contact the pharmacy with further questions, as was advised it is too soon to refill this medication; informed he can refill it on 10/04/17.

## 2017-10-17 DIAGNOSIS — I890 Lymphedema, not elsewhere classified: Secondary | ICD-10-CM | POA: Diagnosis not present

## 2017-10-17 DIAGNOSIS — C770 Secondary and unspecified malignant neoplasm of lymph nodes of head, face and neck: Secondary | ICD-10-CM | POA: Diagnosis not present

## 2017-10-17 DIAGNOSIS — C099 Malignant neoplasm of tonsil, unspecified: Secondary | ICD-10-CM | POA: Diagnosis not present

## 2017-10-17 DIAGNOSIS — Z923 Personal history of irradiation: Secondary | ICD-10-CM | POA: Diagnosis not present

## 2017-10-17 DIAGNOSIS — K1233 Oral mucositis (ulcerative) due to radiation: Secondary | ICD-10-CM | POA: Diagnosis not present

## 2017-10-24 DIAGNOSIS — C099 Malignant neoplasm of tonsil, unspecified: Secondary | ICD-10-CM | POA: Diagnosis not present

## 2017-10-24 DIAGNOSIS — Z08 Encounter for follow-up examination after completed treatment for malignant neoplasm: Secondary | ICD-10-CM | POA: Diagnosis not present

## 2017-10-24 DIAGNOSIS — R682 Dry mouth, unspecified: Secondary | ICD-10-CM | POA: Diagnosis not present

## 2017-10-24 DIAGNOSIS — C779 Secondary and unspecified malignant neoplasm of lymph node, unspecified: Secondary | ICD-10-CM | POA: Diagnosis not present

## 2017-10-24 DIAGNOSIS — I890 Lymphedema, not elsewhere classified: Secondary | ICD-10-CM | POA: Diagnosis not present

## 2017-10-24 DIAGNOSIS — R070 Pain in throat: Secondary | ICD-10-CM | POA: Diagnosis not present

## 2017-10-24 DIAGNOSIS — Z9221 Personal history of antineoplastic chemotherapy: Secondary | ICD-10-CM | POA: Diagnosis not present

## 2017-10-24 DIAGNOSIS — C770 Secondary and unspecified malignant neoplasm of lymph nodes of head, face and neck: Secondary | ICD-10-CM | POA: Diagnosis not present

## 2017-10-24 DIAGNOSIS — Z923 Personal history of irradiation: Secondary | ICD-10-CM | POA: Diagnosis not present

## 2017-10-24 DIAGNOSIS — Z85818 Personal history of malignant neoplasm of other sites of lip, oral cavity, and pharynx: Secondary | ICD-10-CM | POA: Diagnosis not present

## 2017-10-25 ENCOUNTER — Other Ambulatory Visit (INDEPENDENT_AMBULATORY_CARE_PROVIDER_SITE_OTHER): Payer: BLUE CROSS/BLUE SHIELD

## 2017-10-25 ENCOUNTER — Ambulatory Visit (INDEPENDENT_AMBULATORY_CARE_PROVIDER_SITE_OTHER): Payer: BLUE CROSS/BLUE SHIELD

## 2017-10-25 ENCOUNTER — Other Ambulatory Visit: Payer: Self-pay

## 2017-10-25 DIAGNOSIS — I428 Other cardiomyopathies: Secondary | ICD-10-CM | POA: Diagnosis not present

## 2017-10-25 DIAGNOSIS — C099 Malignant neoplasm of tonsil, unspecified: Secondary | ICD-10-CM | POA: Diagnosis not present

## 2017-10-25 DIAGNOSIS — I480 Paroxysmal atrial fibrillation: Secondary | ICD-10-CM

## 2017-10-25 DIAGNOSIS — Z923 Personal history of irradiation: Secondary | ICD-10-CM | POA: Diagnosis not present

## 2017-10-25 DIAGNOSIS — I493 Ventricular premature depolarization: Secondary | ICD-10-CM

## 2017-10-25 DIAGNOSIS — Z5111 Encounter for antineoplastic chemotherapy: Secondary | ICD-10-CM | POA: Diagnosis not present

## 2017-10-25 DIAGNOSIS — C779 Secondary and unspecified malignant neoplasm of lymph node, unspecified: Secondary | ICD-10-CM | POA: Diagnosis not present

## 2017-10-26 LAB — LIPID PANEL
CHOLESTEROL TOTAL: 253 mg/dL — AB (ref 100–199)
Chol/HDL Ratio: 6.7 ratio — ABNORMAL HIGH (ref 0.0–5.0)
HDL: 38 mg/dL — ABNORMAL LOW (ref >39)
LDL Calculated: 157 mg/dL — ABNORMAL HIGH (ref 0–99)
TRIGLYCERIDES: 289 mg/dL — AB (ref 0–149)
VLDL Cholesterol Cal: 58 mg/dL — ABNORMAL HIGH (ref 5–40)

## 2017-10-28 ENCOUNTER — Other Ambulatory Visit: Payer: BLUE CROSS/BLUE SHIELD

## 2017-10-31 ENCOUNTER — Telehealth: Payer: Self-pay

## 2017-10-31 DIAGNOSIS — I429 Cardiomyopathy, unspecified: Secondary | ICD-10-CM

## 2017-10-31 DIAGNOSIS — Z79899 Other long term (current) drug therapy: Secondary | ICD-10-CM

## 2017-10-31 DIAGNOSIS — I5043 Acute on chronic combined systolic (congestive) and diastolic (congestive) heart failure: Secondary | ICD-10-CM

## 2017-10-31 MED ORDER — SACUBITRIL-VALSARTAN 24-26 MG PO TABS: 1.0000 | ORAL_TABLET | Freq: Two times a day (BID) | ORAL | 11 refills | Status: DC

## 2017-10-31 NOTE — Telephone Encounter (Signed)
-----   Message from Deboraha Sprang, MD sent at 10/30/2017  8:38 AM EDT ----- His 10 yr risk is estimated at 7.6 % This puts him into a category where Ca scoring has been used to help decision making If it were 0 the recommendation would be NO statin,but it > 0 then recommended Is he interested ?

## 2017-10-31 NOTE — Telephone Encounter (Signed)
Spoke with pt. He is agreeable to starting Entresto 24/26. He will hold his Benazepril tomorrow morning (3/29) and begin Entresto that evening (36hrs since last dose of Benazepril). He understands it is to be taken, one tablet, two times per day. He will also come in April 15 for a BMP to titrate. He had no additional questions and will call back if he needs anything further.

## 2017-10-31 NOTE — Telephone Encounter (Signed)
Reviewed labs with pt, he would like to stay off statins so we will get him scheduled for a Ca scoring CT. He is aware of the fee and ready to do the test.

## 2017-11-14 ENCOUNTER — Telehealth: Payer: Self-pay

## 2017-11-14 NOTE — Telephone Encounter (Signed)
Pt called to verify he was taking Entresto as ordered. He will be in on Monday 4/15 for repeat BMP and his Ca Scoring CT as well. He had no additional questions.

## 2017-11-18 ENCOUNTER — Ambulatory Visit (INDEPENDENT_AMBULATORY_CARE_PROVIDER_SITE_OTHER)
Admission: RE | Admit: 2017-11-18 | Discharge: 2017-11-18 | Disposition: A | Discharge status: Home / Self Care | Payer: Self-pay | Source: Ambulatory Visit | Attending: Internal Medicine | Admitting: Internal Medicine

## 2017-11-18 ENCOUNTER — Other Ambulatory Visit: Payer: BLUE CROSS/BLUE SHIELD | Admitting: *Deleted

## 2017-11-18 DIAGNOSIS — I429 Cardiomyopathy, unspecified: Secondary | ICD-10-CM

## 2017-11-18 DIAGNOSIS — I5043 Acute on chronic combined systolic (congestive) and diastolic (congestive) heart failure: Secondary | ICD-10-CM | POA: Diagnosis not present

## 2017-11-18 DIAGNOSIS — Z79899 Other long term (current) drug therapy: Secondary | ICD-10-CM

## 2017-11-18 LAB — BASIC METABOLIC PANEL
BUN / CREAT RATIO: 14 (ref 9–20)
BUN: 14 mg/dL (ref 6–24)
CALCIUM: 9.3 mg/dL (ref 8.7–10.2)
CO2: 23 mmol/L (ref 20–29)
Chloride: 102 mmol/L (ref 96–106)
Creatinine, Ser: 0.98 mg/dL (ref 0.76–1.27)
GFR calc Af Amer: 103 mL/min/{1.73_m2} (ref >59)
GFR calc non Af Amer: 89 mL/min/{1.73_m2} (ref >59)
GLUCOSE: 94 mg/dL (ref 65–99)
Potassium: 4 mmol/L (ref 3.5–5.2)
Sodium: 139 mmol/L (ref 134–144)

## 2017-11-26 ENCOUNTER — Telehealth: Payer: Self-pay | Admitting: Internal Medicine

## 2017-11-26 DIAGNOSIS — Z125 Encounter for screening for malignant neoplasm of prostate: Secondary | ICD-10-CM | POA: Diagnosis not present

## 2017-11-26 DIAGNOSIS — I5043 Acute on chronic combined systolic (congestive) and diastolic (congestive) heart failure: Secondary | ICD-10-CM

## 2017-11-26 DIAGNOSIS — I480 Paroxysmal atrial fibrillation: Secondary | ICD-10-CM | POA: Diagnosis not present

## 2017-11-26 DIAGNOSIS — I10 Essential (primary) hypertension: Secondary | ICD-10-CM | POA: Diagnosis not present

## 2017-11-26 DIAGNOSIS — I428 Other cardiomyopathies: Secondary | ICD-10-CM | POA: Diagnosis not present

## 2017-11-26 MED ORDER — SACUBITRIL-VALSARTAN 49-51 MG PO TABS: 1.0000 | ORAL_TABLET | Freq: Two times a day (BID) | ORAL | 11 refills | Status: DC

## 2017-11-26 NOTE — Telephone Encounter (Signed)
New Message:     Pt calling back with some follow up questions about his labs

## 2017-11-26 NOTE — Telephone Encounter (Signed)
Per Dr Caryl Comes, pt is to increase his Entresto dosage to 49/51 based off his last BMP. Pt will pick up sample in [place] on Thursday when Dr Caryl Comes is in the office. He also is agreeable to a repeat BMP on May 9th.

## 2017-12-03 ENCOUNTER — Telehealth: Payer: Self-pay | Admitting: Internal Medicine

## 2017-12-03 NOTE — Telephone Encounter (Signed)
Sent pt MyChart message regarding rescheduling repeat bmp.

## 2017-12-03 NOTE — Telephone Encounter (Signed)
Pt c/o medication issue:  1. Name of Medication:  sacubitril-valsartan (ENTRESTO) 49-51 MG    2. How are you currently taking this medication (dosage and times per day)? Take 1 tablet by mouth 2 (two) times daily. 3. Are you having a reaction (difficulty breathing--STAT)? no  4. What is your medication issue? Pt left medication in his truck and he wants to inform rn that he has started taking it today

## 2017-12-12 ENCOUNTER — Other Ambulatory Visit: Payer: BLUE CROSS/BLUE SHIELD

## 2017-12-13 DIAGNOSIS — G8929 Other chronic pain: Secondary | ICD-10-CM | POA: Diagnosis not present

## 2017-12-13 DIAGNOSIS — M109 Gout, unspecified: Secondary | ICD-10-CM | POA: Diagnosis not present

## 2017-12-13 DIAGNOSIS — I10 Essential (primary) hypertension: Secondary | ICD-10-CM | POA: Diagnosis not present

## 2017-12-13 DIAGNOSIS — Z6834 Body mass index (BMI) 34.0-34.9, adult: Secondary | ICD-10-CM | POA: Diagnosis not present

## 2017-12-13 DIAGNOSIS — E785 Hyperlipidemia, unspecified: Secondary | ICD-10-CM | POA: Diagnosis not present

## 2017-12-13 DIAGNOSIS — M545 Low back pain: Secondary | ICD-10-CM | POA: Diagnosis not present

## 2017-12-16 ENCOUNTER — Other Ambulatory Visit: Payer: BLUE CROSS/BLUE SHIELD

## 2017-12-16 ENCOUNTER — Other Ambulatory Visit (INDEPENDENT_AMBULATORY_CARE_PROVIDER_SITE_OTHER): Payer: BLUE CROSS/BLUE SHIELD

## 2017-12-16 DIAGNOSIS — I5043 Acute on chronic combined systolic (congestive) and diastolic (congestive) heart failure: Secondary | ICD-10-CM

## 2017-12-17 ENCOUNTER — Other Ambulatory Visit: Payer: Self-pay | Admitting: *Deleted

## 2017-12-17 ENCOUNTER — Telehealth: Payer: Self-pay | Admitting: Internal Medicine

## 2017-12-17 ENCOUNTER — Other Ambulatory Visit: Payer: Self-pay

## 2017-12-17 LAB — BASIC METABOLIC PANEL
BUN/Creatinine Ratio: 15 (ref 9–20)
BUN: 15 mg/dL (ref 6–24)
CALCIUM: 9.4 mg/dL (ref 8.7–10.2)
CO2: 25 mmol/L (ref 20–29)
Chloride: 101 mmol/L (ref 96–106)
Creatinine, Ser: 0.97 mg/dL (ref 0.76–1.27)
GFR calc non Af Amer: 90 mL/min/{1.73_m2} (ref >59)
GFR, EST AFRICAN AMERICAN: 104 mL/min/{1.73_m2} (ref >59)
Glucose: 108 mg/dL — ABNORMAL HIGH (ref 65–99)
POTASSIUM: 4.8 mmol/L (ref 3.5–5.2)
Sodium: 139 mmol/L (ref 134–144)

## 2017-12-17 NOTE — Telephone Encounter (Signed)
New message ° ° ° ° °*STAT* If patient is at the pharmacy, call can be transferred to refill team. ° ° °1. Which medications need to be refilled? (please list name of each medication and dose if known) sacubitril-valsartan (ENTRESTO) 49-51 MG ° °2. Which pharmacy/location (including street and city if local pharmacy) is medication to be sent to?[id number] - [place], [place] - [address] ° °3. Do they need a 30 day or 90 day supply? 30 ° °

## 2017-12-17 NOTE — Telephone Encounter (Signed)
Patient was able to have refills transferred from [facility].

## 2017-12-17 NOTE — Telephone Encounter (Signed)
Please review for refill on Xarelto 20 mg.

## 2017-12-18 ENCOUNTER — Other Ambulatory Visit: Payer: Self-pay

## 2017-12-18 MED ORDER — RIVAROXABAN 20 MG PO TABS: 20.0000 mg | ORAL_TABLET | Freq: Every day | ORAL | 5 refills | Status: AC

## 2017-12-18 MED ORDER — RIVAROXABAN 20 MG PO TABS: 20.0000 mg | ORAL_TABLET | Freq: Every day | ORAL | 5 refills | Status: DC

## 2017-12-18 NOTE — Telephone Encounter (Signed)
Age 52 years Wt 105.1 kg  09/26/2017 09/26/2017 Saw Dr Caryl Comes 12/16/2017 SrCr 0.97 CrCl 133.93  Refill done for Xarelto 20 mg daily as requested

## 2017-12-20 ENCOUNTER — Encounter: Payer: Self-pay | Admitting: Internal Medicine

## 2017-12-31 ENCOUNTER — Other Ambulatory Visit: Payer: Self-pay | Admitting: Internal Medicine

## 2018-01-02 DIAGNOSIS — M5137 Other intervertebral disc degeneration, lumbosacral region: Secondary | ICD-10-CM | POA: Diagnosis not present

## 2018-01-02 DIAGNOSIS — M5136 Other intervertebral disc degeneration, lumbar region: Secondary | ICD-10-CM | POA: Diagnosis not present

## 2018-01-02 DIAGNOSIS — M48061 Spinal stenosis, lumbar region without neurogenic claudication: Secondary | ICD-10-CM | POA: Diagnosis not present

## 2018-01-06 DIAGNOSIS — I890 Lymphedema, not elsewhere classified: Secondary | ICD-10-CM | POA: Diagnosis not present

## 2018-01-06 DIAGNOSIS — M542 Cervicalgia: Secondary | ICD-10-CM | POA: Diagnosis not present

## 2018-01-06 DIAGNOSIS — Z510 Encounter for antineoplastic radiation therapy: Secondary | ICD-10-CM | POA: Diagnosis not present

## 2018-01-06 DIAGNOSIS — C770 Secondary and unspecified malignant neoplasm of lymph nodes of head, face and neck: Secondary | ICD-10-CM | POA: Diagnosis not present

## 2018-01-15 DIAGNOSIS — Z79899 Other long term (current) drug therapy: Secondary | ICD-10-CM | POA: Diagnosis not present

## 2018-01-15 DIAGNOSIS — F4310 Post-traumatic stress disorder, unspecified: Secondary | ICD-10-CM | POA: Diagnosis not present

## 2018-01-15 DIAGNOSIS — Z0289 Encounter for other administrative examinations: Secondary | ICD-10-CM | POA: Diagnosis not present

## 2018-01-30 DIAGNOSIS — K117 Disturbances of salivary secretion: Secondary | ICD-10-CM | POA: Diagnosis not present

## 2018-01-30 DIAGNOSIS — Z923 Personal history of irradiation: Secondary | ICD-10-CM | POA: Diagnosis not present

## 2018-01-30 DIAGNOSIS — Z08 Encounter for follow-up examination after completed treatment for malignant neoplasm: Secondary | ICD-10-CM | POA: Diagnosis not present

## 2018-01-30 DIAGNOSIS — C770 Secondary and unspecified malignant neoplasm of lymph nodes of head, face and neck: Secondary | ICD-10-CM | POA: Diagnosis not present

## 2018-01-30 DIAGNOSIS — I890 Lymphedema, not elsewhere classified: Secondary | ICD-10-CM | POA: Diagnosis not present

## 2018-01-30 DIAGNOSIS — C099 Malignant neoplasm of tonsil, unspecified: Secondary | ICD-10-CM | POA: Diagnosis not present

## 2018-01-30 DIAGNOSIS — Z9221 Personal history of antineoplastic chemotherapy: Secondary | ICD-10-CM | POA: Diagnosis not present

## 2018-01-30 DIAGNOSIS — Z85818 Personal history of malignant neoplasm of other sites of lip, oral cavity, and pharynx: Secondary | ICD-10-CM | POA: Diagnosis not present

## 2018-02-03 DIAGNOSIS — I890 Lymphedema, not elsewhere classified: Secondary | ICD-10-CM | POA: Diagnosis not present

## 2018-02-03 DIAGNOSIS — M542 Cervicalgia: Secondary | ICD-10-CM | POA: Diagnosis not present

## 2018-02-03 DIAGNOSIS — C770 Secondary and unspecified malignant neoplasm of lymph nodes of head, face and neck: Secondary | ICD-10-CM | POA: Diagnosis not present

## 2018-02-03 DIAGNOSIS — Z510 Encounter for antineoplastic radiation therapy: Secondary | ICD-10-CM | POA: Diagnosis not present

## 2018-02-14 DIAGNOSIS — L812 Freckles: Secondary | ICD-10-CM | POA: Diagnosis not present

## 2018-02-14 DIAGNOSIS — L570 Actinic keratosis: Secondary | ICD-10-CM | POA: Diagnosis not present

## 2018-02-14 DIAGNOSIS — D225 Melanocytic nevi of trunk: Secondary | ICD-10-CM | POA: Diagnosis not present

## 2018-02-14 DIAGNOSIS — Z1283 Encounter for screening for malignant neoplasm of skin: Secondary | ICD-10-CM | POA: Diagnosis not present

## 2018-02-21 ENCOUNTER — Other Ambulatory Visit: Payer: Self-pay | Admitting: Internal Medicine

## 2018-02-21 ENCOUNTER — Other Ambulatory Visit: Payer: Self-pay | Admitting: Podiatry

## 2018-02-21 DIAGNOSIS — M109 Gout, unspecified: Secondary | ICD-10-CM | POA: Diagnosis not present

## 2018-02-21 DIAGNOSIS — I10 Essential (primary) hypertension: Secondary | ICD-10-CM | POA: Diagnosis not present

## 2018-02-21 DIAGNOSIS — Z125 Encounter for screening for malignant neoplasm of prostate: Secondary | ICD-10-CM | POA: Diagnosis not present

## 2018-02-21 DIAGNOSIS — E7849 Other hyperlipidemia: Secondary | ICD-10-CM | POA: Diagnosis not present

## 2018-02-21 MED ORDER — ALLOPURINOL 300 MG PO TABS: 300.0000 mg | ORAL_TABLET | Freq: Every day | ORAL | 0 refills | Status: DC

## 2018-02-21 MED ORDER — FLUOXETINE HCL 20 MG PO CAPS: 20.0000 mg | ORAL_CAPSULE | Freq: Every day | ORAL | 0 refills | Status: DC

## 2018-02-26 ENCOUNTER — Encounter: Payer: Self-pay | Admitting: Internal Medicine

## 2018-03-11 DIAGNOSIS — I480 Paroxysmal atrial fibrillation: Secondary | ICD-10-CM | POA: Diagnosis not present

## 2018-03-11 DIAGNOSIS — I428 Other cardiomyopathies: Secondary | ICD-10-CM | POA: Diagnosis not present

## 2018-03-11 DIAGNOSIS — C779 Secondary and unspecified malignant neoplasm of lymph node, unspecified: Secondary | ICD-10-CM | POA: Diagnosis not present

## 2018-03-11 DIAGNOSIS — I10 Essential (primary) hypertension: Secondary | ICD-10-CM | POA: Diagnosis not present

## 2018-03-13 ENCOUNTER — Other Ambulatory Visit: Payer: Self-pay

## 2018-03-13 ENCOUNTER — Encounter: Payer: Self-pay | Admitting: Student in an Organized Health Care Education/Training Program

## 2018-03-13 ENCOUNTER — Ambulatory Visit
Payer: BLUE CROSS/BLUE SHIELD | Attending: Student in an Organized Health Care Education/Training Program | Admitting: Student in an Organized Health Care Education/Training Program

## 2018-03-13 VITALS — BP 117/74 | HR 68 | Temp 97.8°F | Resp 18 | Ht 71.0 in | Wt 240.0 lb

## 2018-03-13 DIAGNOSIS — M47816 Spondylosis without myelopathy or radiculopathy, lumbar region: Secondary | ICD-10-CM

## 2018-03-13 DIAGNOSIS — Z79899 Other long term (current) drug therapy: Secondary | ICD-10-CM | POA: Insufficient documentation

## 2018-03-13 DIAGNOSIS — G8929 Other chronic pain: Secondary | ICD-10-CM | POA: Diagnosis not present

## 2018-03-13 DIAGNOSIS — G894 Chronic pain syndrome: Secondary | ICD-10-CM

## 2018-03-13 DIAGNOSIS — M5136 Other intervertebral disc degeneration, lumbar region: Secondary | ICD-10-CM

## 2018-03-13 DIAGNOSIS — Z791 Long term (current) use of non-steroidal anti-inflammatories (NSAID): Secondary | ICD-10-CM | POA: Insufficient documentation

## 2018-03-13 DIAGNOSIS — Z96652 Presence of left artificial knee joint: Secondary | ICD-10-CM | POA: Diagnosis not present

## 2018-03-13 DIAGNOSIS — I429 Cardiomyopathy, unspecified: Secondary | ICD-10-CM | POA: Insufficient documentation

## 2018-03-13 DIAGNOSIS — I4891 Unspecified atrial fibrillation: Secondary | ICD-10-CM | POA: Diagnosis not present

## 2018-03-13 DIAGNOSIS — E669 Obesity, unspecified: Secondary | ICD-10-CM | POA: Diagnosis not present

## 2018-03-13 DIAGNOSIS — Z9889 Other specified postprocedural states: Secondary | ICD-10-CM | POA: Diagnosis not present

## 2018-03-13 DIAGNOSIS — I10 Essential (primary) hypertension: Secondary | ICD-10-CM | POA: Insufficient documentation

## 2018-03-13 DIAGNOSIS — K219 Gastro-esophageal reflux disease without esophagitis: Secondary | ICD-10-CM | POA: Diagnosis not present

## 2018-03-13 DIAGNOSIS — K760 Fatty (change of) liver, not elsewhere classified: Secondary | ICD-10-CM | POA: Diagnosis not present

## 2018-03-13 DIAGNOSIS — M109 Gout, unspecified: Secondary | ICD-10-CM | POA: Diagnosis not present

## 2018-03-13 DIAGNOSIS — Z6833 Body mass index (BMI) 33.0-33.9, adult: Secondary | ICD-10-CM | POA: Insufficient documentation

## 2018-03-13 DIAGNOSIS — F329 Major depressive disorder, single episode, unspecified: Secondary | ICD-10-CM | POA: Insufficient documentation

## 2018-03-13 DIAGNOSIS — E785 Hyperlipidemia, unspecified: Secondary | ICD-10-CM | POA: Insufficient documentation

## 2018-03-13 DIAGNOSIS — F39 Unspecified mood [affective] disorder: Secondary | ICD-10-CM | POA: Insufficient documentation

## 2018-03-13 NOTE — Progress Notes (Signed)
Safety precautions to be maintained throughout the outpatient stay will include: orient to surroundings, keep bed in low position, maintain call bell within reach at all times, provide assistance with transfer out of bed and ambulation.  

## 2018-03-13 NOTE — Progress Notes (Signed)
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 Thyroid cancer in his paternal aunt.  Past Surgical History:  Procedure Laterality Date  . CARDIAC CATHETERIZATION  03/2010   No sig coronary disease, EF ~40%  . CARDIOVERSION  1989  . TOTAL KNEE ARTHROPLASTY Left    Active Ambulatory Problems    Diagnosis Date Noted  . Hyperlipemia 08/04/2007  . GOUT 04/25/2007  . Mood disorder ([place]) 08/04/2007  . Essential hypertension 04/25/2007  . ATRIAL FIBRILLATION 04/25/2007  . GERD 06/13/2010  . FATTY LIVER DISEASE 11/04/2008  . Long term current use of anticoagulant 10/24/2010  . Cardiomyopathy, nonischemic ([place]) 08/06/2009  . Obesity 08/27/2011  . Ventricular tachycardia, non-sustained//PVCs-Sx 11/27/2011  . Preventative health care 06/22/2015  . Primary gout 11/10/2015  . Skin lesion 11/10/2015  . Chronic low back pain with right-sided sciatica 12/23/2015  . Acute bronchitis 06/26/2016  . Cough,  persistent 07/25/2016  . Acute pain of right knee 10/03/2016  . Patellar tendinitis of left knee 10/12/2016  . Left ear pain 04/10/2017   Resolved Ambulatory Problems    Diagnosis Date Noted  . Stridor 01/24/2010  . CHEST PAIN 01/24/2010  . Impaired fasting glucose 08/27/2011  . Low back sprain 12/03/2011  . Routine general medical examination at a health care facility 10/31/2012  . Thoracic back pain 10/31/2012  . RLQ abdominal pain 10/31/2012  . RUQ pain 06/14/2014  . Chest pain 12/14/2014   Past Medical History:  Diagnosis Date  . Anxiety   . Arthritis   . Bicuspid aortic valve   . Cardiomyopathy 08/2009  . Depression   . GERD (gastroesophageal reflux disease)   . Gout   . Heat stroke 1998  . Hyperlipidemia   . Hypertension   . Seizures ([place]) 1998   Constitutional Exam  General appearance: Well nourished, well developed, and well hydrated. In no apparent acute distress Vitals:   03/13/18 0937  BP: 117/74  Pulse: 68  Resp: 18  Temp: 97.8 F (36.6 C)  TempSrc: Oral  SpO2: 99%  Weight: 240 lb (108.9 kg)  Height: _0  (1.803 m)   BMI Assessment: Estimated body mass index is 33.47 kg/m as calculated from the following:   Height as of this encounter: _1  (1.803 m).   Weight as of this encounter: 240 lb (108.9 kg).  BMI interpretation table: BMI level Category Range association with higher incidence of chronic pain  <18 kg/m2 Underweight   18.5-24.9 kg/m2 Ideal body weight   25-29.9 kg/m2 Overweight Increased incidence by 20%  30-34.9 kg/m2 Obese (Class I) Increased incidence by 68%  35-39.9 kg/m2 Severe obesity (Class II) Increased incidence by 136%  >40 kg/m2 Extreme obesity (Class III) Increased incidence by 254%   Patient's current BMI Ideal Body weight  Body mass index is 33.47 kg/m. Ideal body weight: 75.3 kg (166 lb 0.1 oz) Adjusted ideal body weight: 88.7 kg (195 lb 9.7 oz)   BMI Readings from Last 4 Encounters:  03/13/18 33.47 kg/m   09/26/17 32.76 kg/m  05/15/17 35.66 kg/m  05/13/17 34.76 kg/m   Wt Readings from Last 4 Encounters:  03/13/18 240 lb (108.9 kg)  09/26/17 231 lb 9.6 oz (105.1 kg)  05/15/17 250 lb 5.3 oz (113.6 kg)  05/13/17 244 lb (110.7 kg)  Psych/Mental status: Alert, oriented x 3 (person, place, & time)       Eyes:  PERLA Respiratory: No evidence of acute respiratory distress  Cervical Spine Area Exam  Skin & Axial Inspection: No masses, redness, edema, swelling, or associated skin lesions Alignment: Symmetrical Functional ROM: Unrestricted ROM      Stability: No instability detected Muscle Tone/Strength: Functionally intact. No obvious neuro-muscular anomalies detected. Sensory (Neurological): Unimpaired Palpation: No palpable anomalies              Upper Extremity (UE) Exam    Side: Right upper extremity  Side: Left upper extremity  Skin & Extremity Inspection: Skin color, temperature, and hair growth are WNL. No peripheral edema or cyanosis. No masses, redness, swelling, asymmetry, or associated skin lesions. No contractures.  Skin & Extremity Inspection: Skin color, temperature, and hair growth are WNL. No peripheral edema or cyanosis. No masses, redness, swelling, asymmetry, or associated skin lesions. No contractures.  Functional ROM: Unrestricted ROM          Functional ROM: Unrestricted ROM          Muscle Tone/Strength: Functionally intact. No obvious neuro-muscular anomalies detected.  Muscle Tone/Strength: Functionally intact. No obvious neuro-muscular anomalies detected.  Sensory (Neurological): Unimpaired          Sensory (Neurological): Unimpaired          Palpation: No palpable anomalies              Palpation: No palpable anomalies              Provocative Test(s):  Phalen's test: deferred Tinel's test: deferred Apley's scratch test (touch opposite shoulder):  Action 1 (Across chest): deferred Action 2 (Overhead): deferred Action 3 (LB reach): deferred   Provocative  Test(s):  Phalen's test: deferred Tinel's test: deferred Apley's scratch test (touch opposite shoulder):  Action 1 (Across chest): deferred Action 2 (Overhead): deferred Action 3 (LB reach): deferred    Thoracic Spine Area Exam  Skin & Axial Inspection: No masses, redness, or swelling Alignment: Symmetrical Functional ROM: Unrestricted ROM Stability: No instability detected Muscle Tone/Strength: Functionally intact. No obvious neuro-muscular anomalies detected. Sensory (Neurological): Unimpaired Muscle strength & Tone: No palpable anomalies  Lumbar Spine Area Exam  Skin & Axial Inspection: No masses, redness, or swelling Alignment: Symmetrical Functional ROM: Decreased ROM       Stability: No instability detected Muscle Tone/Strength: Functionally intact. No obvious neuro-muscular anomalies detected. Sensory (Neurological): Articular pain pattern +MSK Palpation: No palpable anomalies       Provocative Tests: Hyperextension/rotation test: (+) bilaterally for facet joint pain. Lumbar quadrant test (Kemp's test): deferred today       Lateral bending test: deferred today       Patrick's Maneuver: deferred today                   FABER test: deferred today                   S-I anterior distraction/compression test: deferred today         S-I lateral compression test: deferred today         S-I Thigh-thrust test: deferred today         S-I Gaenslen's test: deferred today           Gait & Posture Assessment  Ambulation: Unassisted Gait: Relatively normal for age and body habitus Posture: WNL   Lower Extremity Exam    Side: Right lower extremity  Side: Left lower extremity  Stability: No instability observed          Stability: No instability  observed          Skin & Extremity Inspection: Skin color, temperature, and hair growth are WNL. No peripheral edema or cyanosis. No masses, redness, swelling, asymmetry, or associated skin lesions. No contractures.  Skin & Extremity  Inspection: Skin color, temperature, and hair growth are WNL. No peripheral edema or cyanosis. No masses, redness, swelling, asymmetry, or associated skin lesions. No contractures.  Functional ROM: Unrestricted ROM                  Functional ROM: Unrestricted ROM                  Muscle Tone/Strength: Functionally intact. No obvious neuro-muscular anomalies detected.  Muscle Tone/Strength: Functionally intact. No obvious neuro-muscular anomalies detected.  Sensory (Neurological): Unimpaired  Sensory (Neurological): Unimpaired  Palpation: No palpable anomalies  Palpation: No palpable anomalies   Assessment  Primary Diagnosis & Pertinent Problem List: The primary encounter diagnosis was Lumbar spondylosis. Diagnoses of Lumbar facet arthropathy, Chronic pain syndrome, and Lumbar degenerative disc disease were also pertinent to this visit.  Visit Diagnosis (New problems to examiner): 1. Lumbar spondylosis   2. Lumbar facet arthropathy   3. Chronic pain syndrome   4. Lumbar degenerative disc disease    General Recommendations: The pain condition that the patient suffers from is best treated with a multidisciplinary approach that involves an increase in physical activity to prevent de-conditioning and worsening of the pain cycle, as well as psychological counseling (formal and/or informal) to address the co-morbid psychological affects of pain. Treatment will often involve judicious use of pain medications and interventional procedures to decrease the pain, allowing the patient to participate in the physical activity that will ultimately produce long-lasting pain reductions. The goal of the multidisciplinary approach is to return the patient to a higher level of overall function and to restore their ability to perform activities of daily living.  52 year old male with a history of axial low back pain, left greater than right, who presents as a patient from the [place] for lumbar facet blocks.  Patient was  previously being seen at the [facility] for left-sided L3, L4, L5 facet medial branch nerve blocks which he would obtain every 6 months.  Patient was scheduled to have a radiofrequency ablation of these nerves however his pain provider moved.  Patient's last lumbar facet medial branch nerve blocks were in 2016 patient did find benefit with these blocks.  Patient was previously in the [employer].  He states that his low back pain started after he jumped off a truck wearing a heavy backpack in the 1990s.  Patient denies any lower extremity weakness or paresthesias in his legs.  Majority of his pain is localized to his lower lumbar spine and upper buttock region.  Denies any bowel or bladder dysfunction.  Currently not on any opioid medications.  Does work out with high intensity interval training.  Of note patient is on Xarelto for his atrial fibrillation.  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  We will focus on interventional pain management specifically lumbar facet medial branch nerve blocks and lumbar radio frequency ablation.  [name] has a history of greater than 3 months of moderate to severe pain which is resulted in functional impairment.  The patient has tried various conservative therapeutic options such as NSAIDs, Tylenol, muscle relaxants, physical therapy which was inadequately effective.  Patient's pain is predominantly axial with physical exam findings suggestive of facet arthropathy.  He has benefited from lumbar facet blocks in the past, his last one being in 2016.  Lumbar facet medial branch nerve blocks were discussed with the patient.  Risks and benefits were reviewed.  Patient would like to proceed with bilateral L3, L4, L5 medial branch nerve block.  Patient instructed to stop Xarelto 5 days prior to scheduled procedure.  Plan of Care (Initial workup plan)  Note: Please be advised that as per protocol, today's visit has been an  evaluation only. We have not taken over the patient's controlled substance management.  Problem-specific plan: No problem-specific Assessment & Plan notes found for this encounter.  Ordered Lab-work, Procedure(s), Referral(s), & Consult(s): Orders Placed This Encounter  Procedures  . LUMBAR FACET(MEDIAL BRANCH NERVE BLOCK) MBNB   Interventional management options: [name] was informed that there is no guarantee that he would be a candidate for interventional therapies. The decision will be based on the results of diagnostic studies, as well as [name]'s risk profile.  Procedure(s) under consideration:  Bilateral L3, L4, L5 facet medial branch nerve block followed by radio frequency ablation.   Provider-requested follow-up: Return in about 2 weeks (around 03/27/2018) for Procedure.  Future Appointments  Date Time Provider [place]  03/24/2018 11:00 AM Gillis Santa, MD [facility] None    Primary Care Physician: Adin Hector, MD Location: [facility] Note by: Gillis Santa, M.D, Date: 03/13/2018; Time: 11:41 AM  Patient Instructions  1. Plan for bilateral lumbar facet blocks at L3,4,5 with sedation. Please stop Xarelto 4 days prior to scheduled procedure.  Facet Blocks Patient Information  Description: The facets are joints in the spine between the vertebrae.  Like any joints in the body, facets can become irritated and painful.  Arthritis can also effect the facets.  By injecting steroids and local anesthetic in and around these joints, we can temporarily block the nerve supply to them.  Steroids act directly on irritated nerves and tissues to reduce selling and inflammation which often leads to decreased pain.  Facet blocks may be done anywhere along the spine from the neck to the low back depending upon the location of your pain.   After numbing the skin with local anesthetic (like Novocaine), a small needle is passed onto the facet joints  under x-ray guidance.  You may experience a sensation of pressure while this is being done.  The entire block usually lasts about 15-25 minutes.   Conditions which may be treated by facet blocks:   Low back/buttock pain  Neck/shoulder pain  Certain types of headaches  Preparation for the injection:  1. Do not eat any solid food or dairy products within 8 hours of your appointment. 2. You may drink clear liquid up to 3 hours before appointment.  Clear liquids include water, black coffee, juice or soda.  No milk or cream please. 3. You may take your regular medication, including pain medications, with a sip of water before your appointment.  Diabetics should hold regular insulin (if taken separately) and take 1/2 normal NPH dose the morning of the procedure.  Carry some sugar containing items with you to your appointment. 4. A driver must accompany you and be prepared to drive you home after your procedure. 5. Bring all your current medications with you. 6. An IV may be inserted and sedation may be given at the discretion of the physician. 7. A blood pressure cuff, EKG and other monitors will often be applied during the procedure.  Some  patients may need to have extra oxygen administered for a short period. 8. You will be asked to provide medical information, including your allergies and medications, prior to the procedure.  We must know immediately if you are taking blood thinners (like Coumadin/Warfarin) or if you are allergic to IV iodine contrast (dye).  We must know if you could possible be pregnant.  Possible side-effects:   Bleeding from needle site  Infection (rare, may require surgery)  Nerve injury (rare)  Numbness & tingling (temporary)  Difficulty urinating (rare, temporary)  Spinal headache (a headache worse with upright posture)  Light-headedness (temporary)  Pain at injection site (serveral days)  Decreased blood pressure (rare, temporary)  Weakness in arm/leg  (temporary)  Pressure sensation in back/neck (temporary)   Call if you experience:   Fever/chills associated with headache or increased back/neck pain  Headache worsened by an upright position  New onset, weakness or numbness of an extremity below the injection site  Hives or difficulty breathing (go to the emergency room)  Inflammation or drainage at the injection site(s)  Severe back/neck pain greater than usual  New symptoms which are concerning to you  Please note:  Although the local anesthetic injected can often make your back or neck feel good for several hours after the injection, the pain will likely return. It takes 3-7 days for steroids to work.  You may not notice any pain relief for at least one week.  If effective, we will often do a series of 2-3 injections spaced 3-6 weeks apart to maximally decrease your pain.  After the initial series, you may be a candidate for a more permanent nerve block of the facets.  If you have any questions, please call #[phone]) [facility]

## 2018-03-13 NOTE — Patient Instructions (Addendum)
1. Plan for bilateral lumbar facet blocks at L3,4,5 with sedation. Please stop Xarelto 4 days prior to scheduled procedure.  Facet Blocks Patient Information  Description: The facets are joints in the spine between the vertebrae.  Like any joints in the body, facets can become irritated and painful.  Arthritis can also effect the facets.  By injecting steroids and local anesthetic in and around these joints, we can temporarily block the nerve supply to them.  Steroids act directly on irritated nerves and tissues to reduce selling and inflammation which often leads to decreased pain.  Facet blocks may be done anywhere along the spine from the neck to the low back depending upon the location of your pain.   After numbing the skin with local anesthetic (like Novocaine), a small needle is passed onto the facet joints under x-ray guidance.  You may experience a sensation of pressure while this is being done.  The entire block usually lasts about 15-25 minutes.   Conditions which may be treated by facet blocks:   Low back/buttock pain  Neck/shoulder pain  Certain types of headaches  Preparation for the injection:  1. Do not eat any solid food or dairy products within 8 hours of your appointment. 2. You may drink clear liquid up to 3 hours before appointment.  Clear liquids include water, black coffee, juice or soda.  No milk or cream please. 3. You may take your regular medication, including pain medications, with a sip of water before your appointment.  Diabetics should hold regular insulin (if taken separately) and take 1/2 normal NPH dose the morning of the procedure.  Carry some sugar containing items with you to your appointment. 4. A driver must accompany you and be prepared to drive you home after your procedure. 5. Bring all your current medications with you. 6. An IV may be inserted and sedation may be given at the discretion of the physician. 7. A blood pressure cuff, EKG and other  monitors will often be applied during the procedure.  Some patients may need to have extra oxygen administered for a short period. 8. You will be asked to provide medical information, including your allergies and medications, prior to the procedure.  We must know immediately if you are taking blood thinners (like Coumadin/Warfarin) or if you are allergic to IV iodine contrast (dye).  We must know if you could possible be pregnant.  Possible side-effects:   Bleeding from needle site  Infection (rare, may require surgery)  Nerve injury (rare)  Numbness & tingling (temporary)  Difficulty urinating (rare, temporary)  Spinal headache (a headache worse with upright posture)  Light-headedness (temporary)  Pain at injection site (serveral days)  Decreased blood pressure (rare, temporary)  Weakness in arm/leg (temporary)  Pressure sensation in back/neck (temporary)   Call if you experience:   Fever/chills associated with headache or increased back/neck pain  Headache worsened by an upright position  New onset, weakness or numbness of an extremity below the injection site  Hives or difficulty breathing (go to the emergency room)  Inflammation or drainage at the injection site(s)  Severe back/neck pain greater than usual  New symptoms which are concerning to you  Please note:  Although the local anesthetic injected can often make your back or neck feel good for several hours after the injection, the pain will likely return. It takes 3-7 days for steroids to work.  You may not notice any pain relief for at least one week.  If effective, we  will often do a series of 2-3 injections spaced 3-6 weeks apart to maximally decrease your pain.  After the initial series, you may be a candidate for a more permanent nerve block of the facets.  If you have any questions, please call #[phone]) [facility]

## 2018-03-17 ENCOUNTER — Other Ambulatory Visit: Payer: Self-pay | Admitting: Internal Medicine

## 2018-03-19 DIAGNOSIS — E039 Hypothyroidism, unspecified: Secondary | ICD-10-CM | POA: Diagnosis not present

## 2018-03-19 DIAGNOSIS — E7849 Other hyperlipidemia: Secondary | ICD-10-CM | POA: Diagnosis not present

## 2018-03-24 ENCOUNTER — Encounter: Payer: Self-pay | Admitting: Student in an Organized Health Care Education/Training Program

## 2018-03-24 ENCOUNTER — Ambulatory Visit (HOSPITAL_BASED_OUTPATIENT_CLINIC_OR_DEPARTMENT_OTHER)
Payer: No Typology Code available for payment source | Admitting: Student in an Organized Health Care Education/Training Program

## 2018-03-24 ENCOUNTER — Other Ambulatory Visit: Payer: Self-pay

## 2018-03-24 ENCOUNTER — Ambulatory Visit
Admission: RE | Admit: 2018-03-24 | Discharge: 2018-03-24 | Disposition: A | Discharge status: Home / Self Care | Payer: No Typology Code available for payment source | Source: Ambulatory Visit | Attending: Student in an Organized Health Care Education/Training Program | Admitting: Student in an Organized Health Care Education/Training Program

## 2018-03-24 VITALS — BP 136/72 | HR 61 | Temp 98.6°F | Resp 12 | Ht 71.0 in | Wt 240.0 lb

## 2018-03-24 DIAGNOSIS — G894 Chronic pain syndrome: Secondary | ICD-10-CM | POA: Diagnosis not present

## 2018-03-24 DIAGNOSIS — M47816 Spondylosis without myelopathy or radiculopathy, lumbar region: Secondary | ICD-10-CM | POA: Diagnosis not present

## 2018-03-24 DIAGNOSIS — Z96652 Presence of left artificial knee joint: Secondary | ICD-10-CM | POA: Insufficient documentation

## 2018-03-24 DIAGNOSIS — M5136 Other intervertebral disc degeneration, lumbar region: Secondary | ICD-10-CM | POA: Insufficient documentation

## 2018-03-24 DIAGNOSIS — M47896 Other spondylosis, lumbar region: Secondary | ICD-10-CM | POA: Diagnosis present

## 2018-03-24 MED ORDER — LACTATED RINGERS IV SOLN
1000.0000 mL | Freq: Once | INTRAVENOUS | Status: AC
  Administered 2018-03-24: 1000 mL via INTRAVENOUS

## 2018-03-24 MED ORDER — MIDAZOLAM HCL 5 MG/5ML IJ SOLN
1.0000 mg | INTRAMUSCULAR | Status: DC | PRN
  Filled 2018-03-24: qty 5

## 2018-03-24 MED ORDER — ROPIVACAINE HCL 2 MG/ML IJ SOLN
10.0000 mL | Freq: Once | INTRAMUSCULAR | Status: AC
  Administered 2018-03-24: 10 mL
  Filled 2018-03-24: qty 10

## 2018-03-24 MED ORDER — DEXAMETHASONE SODIUM PHOSPHATE 10 MG/ML IJ SOLN
10.0000 mg | Freq: Once | INTRAMUSCULAR | Status: AC
  Administered 2018-03-24: 10 mg
  Filled 2018-03-24: qty 1

## 2018-03-24 MED ORDER — LIDOCAINE HCL 2 % IJ SOLN
20.0000 mL | Freq: Once | INTRAMUSCULAR | Status: AC
  Administered 2018-03-24: 400 mg
  Filled 2018-03-24: qty 40

## 2018-03-24 NOTE — Progress Notes (Signed)
Safety precautions to be maintained throughout the outpatient stay will include: orient to surroundings, keep bed in low position, maintain call bell within reach at all times, provide assistance with transfer out of bed and ambulation.  

## 2018-03-24 NOTE — Progress Notes (Signed)
Patient's Name: [name]  MRN: [id number]  Referring Provider: Adin Hector, MD  DOB: 1965/12/07  PCP: Adin Hector, MD  DOS: 03/24/2018  Note by: Gillis Santa, MD  Service setting: Ambulatory outpatient  Specialty: Interventional Pain Management  Patient type: Established  Location: [facility] (AMB) Pain Management Facility  Visit type: Interventional Procedure   Primary Reason for Visit: Interventional Pain Management Treatment. CC: Back Pain (lower)  Procedure:          Anesthesia, Analgesia, Anxiolysis:  Type: Lumbar Facet, Medial Branch Block(s)          Primary Purpose: Diagnostic Region: Posterolateral Lumbosacral Spine Level: L3, L4, L5, Medial Branch Level(s). Injecting these levels blocks the L4-5, and L5-S1 lumbar facet joints. Laterality: Bilateral  Type: Moderate (Conscious) Sedation combined with Local Anesthesia Indication(s): Analgesia and Anxiety Route: Intravenous (IV) IV Access: Secured Sedation: Meaningful verbal contact was maintained at all times during the procedure  Local Anesthetic: Lidocaine 1-2%   Indications: 1. Lumbar spondylosis   2. Lumbar facet arthropathy   3. Chronic pain syndrome   4. Lumbar degenerative disc disease    Pain Score: Pre-procedure: 7 /10 Post-procedure: 2 /10  Pre-op Assessment:  [name] is a 52 y.o. (year old), male patient, seen today for interventional treatment. He  has a past surgical history that includes Cardioversion (1989); Cardiac catheterization (03/2010); and Total knee arthroplasty (Left). [name] has a current medication list which includes the following prescription(s): allopurinol, atorvastatin, carvedilol, colchicine, fluoxetine, hydrocodone-acetaminophen, ibuprofen, levothyroxine, melatonin, multivitamin, rivaroxaban, sacubitril-valsartan, and vitamin c, and the following Facility-Administered Medications: midazolam. His primarily concern today is the Back Pain (lower)  Initial Vital Signs:    Pulse/HCG Rate: 61ECG Heart Rate: 61 Temp: 98.6 F (37 C) Resp: 16 BP: 113/69 SpO2: 99 %  BMI: Estimated body mass index is 33.47 kg/m as calculated from the following:   Height as of this encounter: 5\' 11"  (1.803 m).   Weight as of this encounter: 240 lb (108.9 kg).  Risk Assessment: Allergies: Reviewed. He has No Known Allergies.  Allergy Precautions: None required Coagulopathies: Reviewed. None identified.  Blood-thinner therapy: None at this time Active Infection(s): Reviewed. None identified. [name] is afebrile  Site Confirmation: [name] was asked to confirm the procedure and laterality before marking the site Procedure checklist: Completed Consent: Before the procedure and under the influence of no sedative(s), amnesic(s), or anxiolytics, the patient was informed of the treatment options, risks and possible complications. To fulfill our ethical and legal obligations, as recommended by the American Medical Association's Code of Ethics, I have informed the patient of my clinical impression; the nature and purpose of the treatment or procedure; the risks, benefits, and possible complications of the intervention; the alternatives, including doing nothing; the risk(s) and benefit(s) of the alternative treatment(s) or procedure(s); and the risk(s) and benefit(s) of doing nothing. The patient was provided information about the general risks and possible complications associated with the procedure. These may include, but are not limited to: failure to achieve desired goals, infection, bleeding, organ or nerve damage, allergic reactions, paralysis, and death. In addition, the patient was informed of those risks and complications associated to Spine-related procedures, such as failure to decrease pain; infection (i.e.: Meningitis, epidural or intraspinal abscess); bleeding (i.e.: epidural hematoma, subarachnoid hemorrhage, or any other type of intraspinal or peri-dural bleeding); organ  or nerve damage (i.e.: Any type of peripheral nerve, nerve root, or spinal cord injury) with subsequent damage to sensory, motor, and/or  autonomic systems, resulting in permanent pain, numbness, and/or weakness of one or several areas of the body; allergic reactions; (i.e.: anaphylactic reaction); and/or death. Furthermore, the patient was informed of those risks and complications associated with the medications. These include, but are not limited to: allergic reactions (i.e.: anaphylactic or anaphylactoid reaction(s)); adrenal axis suppression; blood sugar elevation that in diabetics may result in ketoacidosis or comma; water retention that in patients with history of congestive heart failure may result in shortness of breath, pulmonary edema, and decompensation with resultant heart failure; weight gain; swelling or edema; medication-induced neural toxicity; particulate matter embolism and blood vessel occlusion with resultant organ, and/or nervous system infarction; and/or aseptic necrosis of one or more joints. Finally, the patient was informed that Medicine is not an exact science; therefore, there is also the possibility of unforeseen or unpredictable risks and/or possible complications that may result in a catastrophic outcome. The patient indicated having understood very clearly. We have given the patient no guarantees and we have made no promises. Enough time was given to the patient to ask questions, all of which were answered to the patient's satisfaction. [name] has indicated that he wanted to continue with the procedure. Attestation: I, the ordering provider, attest that I have discussed with the patient the benefits, risks, side-effects, alternatives, likelihood of achieving goals, and potential problems during recovery for the procedure that I have provided informed consent. Date  Time: 03/24/2018 10:53 AM  Pre-Procedure Preparation:  Monitoring: As per clinic protocol. Respiration, ETCO2,  SpO2, BP, heart rate and rhythm monitor placed and checked for adequate function Safety Precautions: Patient was assessed for positional comfort and pressure points before starting the procedure. Time-out: I initiated and conducted the "Time-out" before starting the procedure, as per protocol. The patient was asked to participate by confirming the accuracy of the "Time Out" information. Verification of the correct person, site, and procedure were performed and confirmed by me, the nursing staff, and the patient. "Time-out" conducted as per Joint Commission's Universal Protocol (UP.01.01.01). Time: 1144  Description of Procedure:          Position: Prone Laterality: Bilateral. The procedure was performed in identical fashion on both sides. Levels:   L3, L4, L5, Medial Branch Level(s) Area Prepped: Posterior Lumbosacral Region Prepping solution: ChloraPrep (2% chlorhexidine gluconate and 70% isopropyl alcohol) Safety Precautions: Aspiration looking for blood return was conducted prior to all injections. At no point did we inject any substances, as a needle was being advanced. Before injecting, the patient was told to immediately notify me if he was experiencing any new onset of "ringing in the ears, or metallic taste in the mouth". No attempts were made at seeking any paresthesias. Safe injection practices and needle disposal techniques used. Medications properly checked for expiration dates. SDV (single dose vial) medications used. After the completion of the procedure, all disposable equipment used was discarded in the proper designated medical waste containers. Local Anesthesia: Protocol guidelines were followed. The patient was positioned over the fluoroscopy table. The area was prepped in the usual manner. The time-out was completed. The target area was identified using fluoroscopy. A 12-in long, straight, sterile hemostat was used with fluoroscopic guidance to locate the targets for each level  blocked. Once located, the skin was marked with an approved surgical skin marker. Once all sites were marked, the skin (epidermis, dermis, and hypodermis), as well as deeper tissues (fat, connective tissue and muscle) were infiltrated with a small amount of a short-acting local anesthetic, loaded on  a 10cc syringe with a 25G, 1.5-in  Needle. An appropriate amount of time was allowed for local anesthetics to take effect before proceeding to the next step. Local Anesthetic: Lidocaine 2.0% The unused portion of the local anesthetic was discarded in the proper designated containers. Technical explanation of process:   L3 Medial Branch Nerve Block (MBB): The target area for the L3 medial branch is at the junction of the postero-lateral aspect of the superior articular process and the superior, posterior, and medial edge of the transverse process of L4. Under fluoroscopic guidance, a Quincke needle was inserted until contact was made with os over the superior postero-lateral aspect of the pedicular shadow (target area). After negative aspiration for blood, 1.5 mL of the nerve block solution was injected without difficulty or complication. The needle was removed intact. L4 Medial Branch Nerve Block (MBB): The target area for the L4 medial branch is at the junction of the postero-lateral aspect of the superior articular process and the superior, posterior, and medial edge of the transverse process of L5. Under fluoroscopic guidance, a Quincke needle was inserted until contact was made with os over the superior postero-lateral aspect of the pedicular shadow (target area). After negative aspiration for blood, 1.5 mL of the nerve block solution was injected without difficulty or complication. The needle was removed intact. L5 Medial Branch Nerve Block (MBB): The target area for the L5 medial branch is at the junction of the postero-lateral aspect of the superior articular process and the superior, posterior, and medial  edge of the sacral ala. Under fluoroscopic guidance, a Quincke needle was inserted until contact was made with os over the superior postero-lateral aspect of the pedicular shadow (target area). After negative aspiration for blood,1.3mL of the nerve block solution was injected without difficulty or complication. The needle was removed intact.  Procedural Needles: 22-gauge, 3.5-inch, Quincke needles used for all levels. Nerve block solution: 10 cc solution made of 9 cc of 0.2% ropivacaine, 1 cc of Decadron 10 mg/cc.  1.5 cc injected at each level above bilaterally.  The unused portion of the solution was discarded in the proper designated containers.  Once the entire procedure was completed, the treated area was cleaned, making sure to leave some of the prepping solution back to take advantage of its long term bactericidal properties.   Illustration of the posterior view of the lumbar spine and the posterior neural structures. Laminae of L2 through S1 are labeled. DPRL5, dorsal primary ramus of L5; DPRS1, dorsal primary ramus of S1; DPR3, dorsal primary ramus of L3; FJ, facet (zygapophyseal) joint L3-L4; I, inferior articular process of L4; LB1, lateral branch of dorsal primary ramus of L1; IAB, inferior articular branches from L3 medial branch (supplies L4-L5 facet joint); IBP, intermediate branch plexus; MB3, medial branch of dorsal primary ramus of L3; NR3, third lumbar nerve root; S, superior articular process of L5; SAB, superior articular branches from L4 (supplies L4-5 facet joint also); TP3, transverse process of L3.  Vitals:   03/24/18 1200 03/24/18 1210 03/24/18 1220 03/24/18 1229  BP: 117/74 110/70 116/73 136/72  Pulse:      Resp: 15 12 13 12   Temp:      TempSrc:      SpO2: 99% 100% 100% 100%  Weight:      Height:        Start Time: 1144 hrs. End Time: 1200 hrs.  Imaging Guidance (Spinal):          Type of Imaging Technique: Fluoroscopy Guidance (  Spinal) Indication(s): Assistance  in needle guidance and placement for procedures requiring needle placement in or near specific anatomical locations not easily accessible without such assistance. Exposure Time: Please see nurses notes. Contrast: None used. Fluoroscopic Guidance: I was personally present during the use of fluoroscopy. "Tunnel Vision Technique" used to obtain the best possible view of the target area. Parallax error corrected before commencing the procedure. "Direction-depth-direction" technique used to introduce the needle under continuous pulsed fluoroscopy. Once target was reached, antero-posterior, oblique, and lateral fluoroscopic projection used confirm needle placement in all planes. Images permanently stored in EMR. Interpretation: No contrast injected. I personally interpreted the imaging intraoperatively. Adequate needle placement confirmed in multiple planes. Permanent images saved into the patient's record.  Antibiotic Prophylaxis:   Anti-infectives (From admission, onward)   None     Indication(s): None identified  Post-operative Assessment:  Post-procedure Vital Signs:  Pulse/HCG Rate: 6164 Temp: 98.6 F (37 C) Resp: 12 BP: 136/72 SpO2: 100 %  EBL: None  Complications: No immediate post-treatment complications observed by team, or reported by patient.  Note: The patient tolerated the entire procedure well. A repeat set of vitals were taken after the procedure and the patient was kept under observation following institutional policy, for this type of procedure. Post-procedural neurological assessment was performed, showing return to baseline, prior to discharge. The patient was provided with post-procedure discharge instructions, including a section on how to identify potential problems. Should any problems arise concerning this procedure, the patient was given instructions to immediately contact us, at any time, without hesitation. In any case, we plan to contact the patient by telephone for a  follow-up status report regarding this interventional procedure.  Comments:  No additional relevant information. 5 out of 5 strength bilateral lower extremity: Plantar flexion, dorsiflexion, knee flexion, knee extension.  Plan of Care   Imaging Orders     DG C-Arm 1-60 Min-No Report Procedure Orders    No procedure(s) ordered today    Medications ordered for procedure: Meds ordered this encounter  Medications  . lactated ringers infusion 1,000 mL  . midazolam (VERSED) 5 MG/5ML injection 1-2 mg    Make sure Flumazenil is available in the pyxis when using this medication. If oversedation occurs, administer 0.2 mg IV over 15 sec. If after 45 sec no response, administer 0.2 mg again over 1 min; may repeat at 1 min intervals; not to exceed 4 doses (1 mg)  . ropivacaine (PF) 2 mg/mL (0.2%) (NAROPIN) injection 10 mL  . lidocaine (XYLOCAINE) 2 % (with pres) injection 400 mg  . dexamethasone (DECADRON) injection 10 mg   Medications administered: We administered lactated ringers, ropivacaine (PF) 2 mg/mL (0.2%), lidocaine, and dexamethasone.  See the medical record for exact dosing, route, and time of administration.  New Prescriptions   No medications on file   Disposition: Discharge home  Discharge Date & Time: 03/24/2018; 1232 hrs.   Physician-requested Follow-up: Return in about 3 weeks (around 04/14/2018) for Post Procedure Evaluation.  Future Appointments  Date Time Provider [place]  04/14/2018  7:50 AM Gillis Santa, MD [facility] None   Primary Care Physician: Adin Hector, MD Location: [facility] Note by: Gillis Santa, MD Date: 03/24/2018; Time: 12:43 PM  Disclaimer:  Medicine is not an exact science. The only guarantee in medicine is that nothing is guaranteed. It is important to note that the decision to proceed with this intervention was based on the information collected from the patient. The Data and conclusions  were drawn from  the patient's questionnaire, the interview, and the physical examination. Because the information was provided in large part by the patient, it cannot be guaranteed that it has not been purposely or unconsciously manipulated. Every effort has been made to obtain as much relevant data as possible for this evaluation. It is important to note that the conclusions that lead to this procedure are derived in large part from the available data. Always take into account that the treatment will also be dependent on availability of resources and existing treatment guidelines, considered by other Pain Management Practitioners as being common knowledge and practice, at the time of the intervention. For Medico-Legal purposes, it is also important to point out that variation in procedural techniques and pharmacological choices are the acceptable norm. The indications, contraindications, technique, and results of the above procedure should only be interpreted and judged by a Board-Certified Interventional Pain Specialist with extensive familiarity and expertise in the same exact procedure and technique.

## 2018-03-24 NOTE — Patient Instructions (Signed)
____________________________________________________________________________________________  Post-Procedure Discharge Instructions  Instructions:  Apply ice: Fill a plastic sandwich bag with crushed ice. Cover it with a small towel and apply to injection site. Apply for 15 minutes then remove x 15 minutes. Repeat sequence on day of procedure, until you go to bed. The purpose is to minimize swelling and discomfort after procedure.  Apply heat: Apply heat to procedure site starting the day following the procedure. The purpose is to treat any soreness and discomfort from the procedure.  Food intake: Start with clear liquids (like water) and advance to regular food, as tolerated.   Physical activities: Keep activities to a minimum for the first 8 hours after the procedure.   Driving: If you have received any sedation, you are not allowed to drive for 24 hours after your procedure.  Blood thinner: Restart your blood thinner 6 hours after your procedure. (Only for those taking blood thinners)  Insulin: As soon as you can eat, you may resume your normal dosing schedule. (Only for those taking insulin)  Infection prevention: Keep procedure site clean and dry.  Post-procedure Pain Diary: Extremely important that this be done correctly and accurately. Recorded information will be used to determine the next step in treatment.  Pain evaluated is that of treated area only. Do not include pain from an untreated area.  Complete every hour, on the hour, for the initial 8 hours. Set an alarm to help you do this part accurately.  Do not go to sleep and have it completed later. It will not be accurate.  Follow-up appointment: Keep your follow-up appointment after the procedure. Usually 2 weeks for most procedures. (6 weeks in the case of radiofrequency.) Bring you pain diary.   Expect:  From numbing medicine (AKA: Local Anesthetics): Numbness or decrease in pain.  Onset: Full effect within 15  minutes of injected.  Duration: It will depend on the type of local anesthetic used. On the average, 1 to 8 hours.   From steroids: Decrease in swelling or inflammation. Once inflammation is improved, relief of the pain will follow.  Onset of benefits: Depends on the amount of swelling present. The more swelling, the longer it will take for the benefits to be seen. In some cases, up to 10 days.  Duration: Steroids will stay in the system x 2 weeks. Duration of benefits will depend on multiple posibilities including persistent irritating factors.  From procedure: Some discomfort is to be expected once the numbing medicine wears off. This should be minimal if ice and heat are applied as instructed.  Call if:  You experience numbness and weakness that gets worse with time, as opposed to wearing off.  New onset bowel or bladder incontinence. (This applies to Spinal procedures only)  Emergency Numbers:  [facility] business hours (Monday - Thursday, 8:00 AM - 4:00 PM) (Friday, 9:00 AM - 12:00 Noon): [phone]  After hours: [phone] ____________________________________________________________________________________________   Trigger Point Injection Trigger points are areas where you have pain. A trigger point injection is a shot given in the trigger point to help relieve pain for a few days to a few months. Common places for trigger points include:  The neck.  The shoulders.  The upper back.  The lower back.  A trigger point injection will not cure long-lasting (chronic) pain permanently. These injections do not always work for every person, but for some people they can help to relieve pain for a few days to a few months. Tell a health care provider  about:  Any allergies you have.  All medicines you are taking, including vitamins, herbs, eye drops, creams, and over-the-counter medicines.  Any problems you or family members have had with anesthetic medicines.  Any blood  disorders you have.  Any surgeries you have had.  Any medical conditions you have. What are the risks? Generally, this is a safe procedure. However, problems may occur, including:  Infection.  Bleeding.  Allergic reaction to the injected medicine.  Irritation of the skin around the injection site.  What happens before the procedure?  Ask your health care provider about changing or stopping your regular medicines. This is especially important if you are taking diabetes medicines or blood thinners. What happens during the procedure?  Your health care provider will feel for trigger points. A marker may be used to circle the area for the injection.  The skin over the trigger point will be washed with a germ-killing (antiseptic) solution.  A thin needle is used for the shot. You may feel pain or a twitching feeling when the needle enters the trigger point.  A numbing solution may be injected into the trigger point. Sometimes a medicine to keep down swelling, redness, and warmth (inflammation) is also injected.  Your health care provider may move the needle around the area where the trigger point is located until the tightness and twitching goes away.  After the injection, your health care provider may put gentle pressure over the injection site.  The injection site will be covered with a bandage (dressing). The procedure may vary among health care providers and hospitals. What happens after the procedure?  The dressing can be taken off in a few hours or as told by your health care provider.  You may feel sore and stiff for 1-2 days. This information is not intended to replace advice given to you by your health care provider. Make sure you discuss any questions you have with your health care provider. Document Released: 07/12/2011 Document Revised: 03/25/2016 Document Reviewed: 01/10/2015 Elsevier Interactive Patient Education  2018 [employer].

## 2018-03-25 ENCOUNTER — Telehealth: Payer: Self-pay | Admitting: *Deleted

## 2018-03-25 NOTE — Telephone Encounter (Signed)
Spoke with Remo Lipps, verbalizes no questions or concerns re; procedure on yesterday.

## 2018-03-26 ENCOUNTER — Ambulatory Visit: Payer: Non-veteran care | Admitting: Student in an Organized Health Care Education/Training Program

## 2018-04-01 DIAGNOSIS — I429 Cardiomyopathy, unspecified: Secondary | ICD-10-CM | POA: Diagnosis not present

## 2018-04-01 DIAGNOSIS — H47391 Other disorders of optic disc, right eye: Secondary | ICD-10-CM | POA: Diagnosis not present

## 2018-04-01 DIAGNOSIS — I480 Paroxysmal atrial fibrillation: Secondary | ICD-10-CM | POA: Diagnosis not present

## 2018-04-01 DIAGNOSIS — H2513 Age-related nuclear cataract, bilateral: Secondary | ICD-10-CM | POA: Diagnosis not present

## 2018-04-01 DIAGNOSIS — Z8521 Personal history of malignant neoplasm of larynx: Secondary | ICD-10-CM | POA: Diagnosis not present

## 2018-04-01 DIAGNOSIS — Z7901 Long term (current) use of anticoagulants: Secondary | ICD-10-CM | POA: Diagnosis not present

## 2018-04-01 DIAGNOSIS — H524 Presbyopia: Secondary | ICD-10-CM | POA: Diagnosis not present

## 2018-04-01 DIAGNOSIS — I10 Essential (primary) hypertension: Secondary | ICD-10-CM | POA: Diagnosis not present

## 2018-04-03 DIAGNOSIS — M7701 Medial epicondylitis, right elbow: Secondary | ICD-10-CM | POA: Diagnosis not present

## 2018-04-08 DIAGNOSIS — I480 Paroxysmal atrial fibrillation: Secondary | ICD-10-CM | POA: Diagnosis not present

## 2018-04-08 DIAGNOSIS — I517 Cardiomegaly: Secondary | ICD-10-CM | POA: Diagnosis not present

## 2018-04-08 DIAGNOSIS — I700 Atherosclerosis of aorta: Secondary | ICD-10-CM | POA: Diagnosis not present

## 2018-04-10 ENCOUNTER — Other Ambulatory Visit: Payer: Self-pay | Admitting: Internal Medicine

## 2018-04-14 ENCOUNTER — Ambulatory Visit
Payer: No Typology Code available for payment source | Attending: Student in an Organized Health Care Education/Training Program | Admitting: Student in an Organized Health Care Education/Training Program

## 2018-04-14 ENCOUNTER — Other Ambulatory Visit: Payer: Self-pay

## 2018-04-14 ENCOUNTER — Encounter: Payer: Self-pay | Admitting: Student in an Organized Health Care Education/Training Program

## 2018-04-14 VITALS — BP 132/74 | HR 66 | Temp 98.1°F | Resp 16 | Ht 71.0 in | Wt 240.0 lb

## 2018-04-14 DIAGNOSIS — K760 Fatty (change of) liver, not elsewhere classified: Secondary | ICD-10-CM | POA: Insufficient documentation

## 2018-04-14 DIAGNOSIS — F329 Major depressive disorder, single episode, unspecified: Secondary | ICD-10-CM | POA: Diagnosis not present

## 2018-04-14 DIAGNOSIS — I4891 Unspecified atrial fibrillation: Secondary | ICD-10-CM | POA: Diagnosis not present

## 2018-04-14 DIAGNOSIS — Z8249 Family history of ischemic heart disease and other diseases of the circulatory system: Secondary | ICD-10-CM | POA: Insufficient documentation

## 2018-04-14 DIAGNOSIS — J209 Acute bronchitis, unspecified: Secondary | ICD-10-CM | POA: Diagnosis not present

## 2018-04-14 DIAGNOSIS — M5136 Other intervertebral disc degeneration, lumbar region: Secondary | ICD-10-CM | POA: Diagnosis not present

## 2018-04-14 DIAGNOSIS — E785 Hyperlipidemia, unspecified: Secondary | ICD-10-CM | POA: Diagnosis not present

## 2018-04-14 DIAGNOSIS — Z79899 Other long term (current) drug therapy: Secondary | ICD-10-CM | POA: Diagnosis not present

## 2018-04-14 DIAGNOSIS — L989 Disorder of the skin and subcutaneous tissue, unspecified: Secondary | ICD-10-CM | POA: Insufficient documentation

## 2018-04-14 DIAGNOSIS — I472 Ventricular tachycardia: Secondary | ICD-10-CM | POA: Insufficient documentation

## 2018-04-14 DIAGNOSIS — K219 Gastro-esophageal reflux disease without esophagitis: Secondary | ICD-10-CM | POA: Insufficient documentation

## 2018-04-14 DIAGNOSIS — M47816 Spondylosis without myelopathy or radiculopathy, lumbar region: Secondary | ICD-10-CM | POA: Insufficient documentation

## 2018-04-14 DIAGNOSIS — E669 Obesity, unspecified: Secondary | ICD-10-CM | POA: Insufficient documentation

## 2018-04-14 DIAGNOSIS — M545 Low back pain: Secondary | ICD-10-CM | POA: Diagnosis present

## 2018-04-14 DIAGNOSIS — M109 Gout, unspecified: Secondary | ICD-10-CM | POA: Insufficient documentation

## 2018-04-14 DIAGNOSIS — M25561 Pain in right knee: Secondary | ICD-10-CM | POA: Diagnosis not present

## 2018-04-14 DIAGNOSIS — G894 Chronic pain syndrome: Secondary | ICD-10-CM | POA: Diagnosis not present

## 2018-04-14 DIAGNOSIS — I10 Essential (primary) hypertension: Secondary | ICD-10-CM | POA: Diagnosis not present

## 2018-04-14 DIAGNOSIS — M5441 Lumbago with sciatica, right side: Secondary | ICD-10-CM | POA: Insufficient documentation

## 2018-04-14 DIAGNOSIS — Z7901 Long term (current) use of anticoagulants: Secondary | ICD-10-CM | POA: Diagnosis not present

## 2018-04-14 DIAGNOSIS — I429 Cardiomyopathy, unspecified: Secondary | ICD-10-CM | POA: Insufficient documentation

## 2018-04-14 DIAGNOSIS — H9202 Otalgia, left ear: Secondary | ICD-10-CM | POA: Diagnosis not present

## 2018-04-14 NOTE — Progress Notes (Signed)
Safety precautions to be maintained throughout the outpatient stay will include: orient to surroundings, keep bed in low position, maintain call bell within reach at all times, provide assistance with transfer out of bed and ambulation.  

## 2018-04-14 NOTE — Progress Notes (Signed)
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 ____________________________________________________________________________________________  ____________________________________________________________________________________________  Preparing for Procedure with Sedation  Instructions: . Oral Intake: Do not eat or drink anything for at least 8 hours prior to your procedure. . Transportation: Public transportation is not allowed. Bring an adult driver. The driver must be physically present in our waiting room before any procedure can be started. Marland Kitchen Physical Assistance: Bring an adult physically capable of assisting you, in the event you need help. This adult should keep you company at home for at least 6 hours after the procedure. . Blood Pressure Medicine: Take your blood pressure medicine with a sip of water the morning of the procedure. . Blood thinners: Notify our staff if you are taking any blood thinners. Depending on which one you take, there will be specific instructions on how and when to stop it. . Diabetics on insulin: Notify the staff so that you can be scheduled 1st  case in the morning. If your diabetes requires high dose insulin, take only  of your normal insulin dose the morning of the procedure and notify the staff that you have done so. . Preventing infections: Shower with an antibacterial soap the morning of your procedure. . Build-up your immune system: Take 1000 mg of Vitamin C with every meal (3 times a day) the day prior to your procedure. Marland Kitchen Antibiotics: Inform the staff if you have a condition or reason that requires you to take antibiotics before dental procedures. . Pregnancy: If you are pregnant, call and cancel the procedure. . Sickness: If you have a cold, fever, or any active infections, call and cancel the procedure. . Arrival: You must be in the facility at least 30 minutes prior to your scheduled procedure. . Children: Do not bring children with you. . Dress appropriately: Bring dark clothing that you would not mind if they get stained. . Valuables: Do not bring any jewelry or valuables.  Procedure appointments are reserved for interventional treatments only. Marland Kitchen No Prescription Refills. . No medication changes will be discussed during procedure appointments. . No disability issues will be discussed.  Reasons to call and reschedule or cancel your procedure: (Following these recommendations will minimize the risk of a serious complication.) . Surgeries: Avoid having procedures within 2 weeks of any surgery. (Avoid for 2 weeks before or after any surgery). . Flu Shots: Avoid having procedures within 2 weeks of a flu shots or . (Avoid for 2 weeks before or after immunizations). . Barium: Avoid having a procedure within 7-10 days after having had a radiological study involving the use of radiological contrast. (Myelograms, Barium swallow or enema study). . Heart attacks: Avoid any elective procedures or surgeries for the initial 6 months after a "Myocardial Infarction" (Heart Attack). . Blood thinners: It is imperative that you stop these  medications before  procedures. Let us know if you if you take any blood thinner.  . Infection: Avoid procedures during or within two weeks of an infection (including chest colds or gastrointestinal problems). Symptoms associated with infections include: Localized redness, fever, chills, night sweats or profuse sweating, burning sensation when voiding, cough, congestion, stuffiness, runny nose, sore throat, diarrhea, nausea, vomiting, cold or Flu symptoms, recent or current infections. It is specially important if the infection is over the area that we intend to treat. Marland Kitchen Heart and lung problems: Symptoms that may suggest an active cardiopulmonary problem include: cough, chest pain, breathing difficulties or shortness of breath, dizziness, ankle swelling, uncontrolled high or unusually low blood pressure, and/or palpitations. If you are experiencing any of these symptoms, cancel your procedure and contact your primary care physician for an evaluation.  Remember:  Regular Business hours are:  Monday to Thursday 8:00 AM to 4:00 PM  Provider's Schedule: Milinda Pointer, MD:  Procedure days: Tuesday and Thursday 7:30 AM to 4:00 PM  Gillis Santa, MD:  Procedure days: Monday and Wednesday 7:30 AM to 4:00 PM ____________________________________________________________________________________________  Facet Blocks Patient Information  Description: The facets are joints in the spine between the vertebrae.  Like any joints in the body, facets can become irritated and painful.  Arthritis can also effect the facets.  By injecting steroids and local anesthetic in and around these joints, we can temporarily block the nerve supply to them.  Steroids act directly on irritated nerves and tissues to reduce selling and inflammation which often leads to decreased pain.  Facet blocks may be done anywhere along the spine from the neck to the low back depending upon the location of your pain.   After numbing the skin  with local anesthetic (like Novocaine), a small needle is passed onto the facet joints under x-ray guidance.  You may experience a sensation of pressure while this is being done.  The entire block usually lasts about 15-25 minutes.   Conditions which may be treated by facet blocks:   Low back/buttock pain  Neck/shoulder pain  Certain types of headaches  Preparation for the injection:  1. Do not eat any solid food or dairy products within 8 hours of your appointment. 2. You may drink clear liquid up to 3 hours before appointment.  Clear liquids include water, black coffee, juice or soda.  No milk or cream please. 3. You may take your regular medication, including pain medications, with a sip of water before your appointment.  Diabetics should hold regular insulin (if taken separately) and take 1/2 normal NPH dose the morning of the procedure.  Carry some sugar containing items with you to your appointment. 4. A driver must accompany you and be prepared to drive you home after your procedure. 5. Bring all your current medications with you. 6. An IV may be inserted and sedation may be given at the discretion of the physician. 7. A blood pressure cuff, EKG and other monitors will often be applied during the procedure.  Some patients may need to have extra oxygen administered for a short period. 8. You will be asked to provide medical information, including your allergies and medications, prior to the procedure.  We must know immediately if you are taking blood thinners (like Coumadin/Warfarin) or if you are allergic to IV iodine contrast (dye).  We must know if you could possible be pregnant.  Possible side-effects:   Bleeding from needle site  Infection (rare, may require surgery)  Nerve injury (rare)  Numbness &  tingling (temporary)  Difficulty urinating (rare, temporary)  Spinal headache (a headache worse with upright posture)  Light-headedness (temporary)  Pain at injection  site (serveral days)  Decreased blood pressure (rare, temporary)  Weakness in arm/leg (temporary)  Pressure sensation in back/neck (temporary)   Call if you experience:   Fever/chills associated with headache or increased back/neck pain  Headache worsened by an upright position  New onset, weakness or numbness of an extremity below the injection site  Hives or difficulty breathing (go to the emergency room)  Inflammation or drainage at the injection site(s)  Severe back/neck pain greater than usual  New symptoms which are concerning to you  Please note:  Although the local anesthetic injected can often make your back or neck feel good for several hours after the injection, the pain will likely return. It takes 3-7 days for steroids to work.  You may not notice any pain relief for at least one week.  If effective, we will often do a series of 2-3 injections spaced 3-6 weeks apart to maximally decrease your pain.  After the initial series, you may be a candidate for a more permanent nerve block of the facets.  If you have any questions, please call #[phone]) [facility]  Patient instructed on pre sedation instructions. Will stop Xarelto x 3 days prior to procedure.

## 2018-04-14 NOTE — Patient Instructions (Addendum)
____________________________________________________________________________________________  Preparing for Procedure with Sedation  Instructions: . Oral Intake: Do not eat or drink anything for at least 8 hours prior to your procedure. . Transportation: Public transportation is not allowed. Bring an adult driver. The driver must be physically present in our waiting room before any procedure can be started. . Physical Assistance: Bring an adult physically capable of assisting you, in the event you need help. This adult should keep you company at home for at least 6 hours after the procedure. . Blood Pressure Medicine: Take your blood pressure medicine with a sip of water the morning of the procedure. . Blood thinners: Notify our staff if you are taking any blood thinners. Depending on which one you take, there will be specific instructions on how and when to stop it. . Diabetics on insulin: Notify the staff so that you can be scheduled 1st case in the morning. If your diabetes requires high dose insulin, take only  of your normal insulin dose the morning of the procedure and notify the staff that you have done so. . Preventing infections: Shower with an antibacterial soap the morning of your procedure. . Build-up your immune system: Take 1000 mg of Vitamin C with every meal (3 times a day) the day prior to your procedure. . Antibiotics: Inform the staff if you have a condition or reason that requires you to take antibiotics before dental procedures. . Pregnancy: If you are pregnant, call and cancel the procedure. . Sickness: If you have a cold, fever, or any active infections, call and cancel the procedure. . Arrival: You must be in the facility at least 30 minutes prior to your scheduled procedure. . Children: Do not bring children with you. . Dress appropriately: Bring dark clothing that you would not mind if they get stained. . Valuables: Do not bring any jewelry or valuables.  Procedure  appointments are reserved for interventional treatments only. . No Prescription Refills. . No medication changes will be discussed during procedure appointments. . No disability issues will be discussed.  Reasons to call and reschedule or cancel your procedure: (Following these recommendations will minimize the risk of a serious complication.) . Surgeries: Avoid having procedures within 2 weeks of any surgery. (Avoid for 2 weeks before or after any surgery). . Flu Shots: Avoid having procedures within 2 weeks of a flu shots or . (Avoid for 2 weeks before or after immunizations). . Barium: Avoid having a procedure within 7-10 days after having had a radiological study involving the use of radiological contrast. (Myelograms, Barium swallow or enema study). . Heart attacks: Avoid any elective procedures or surgeries for the initial 6 months after a "Myocardial Infarction" (Heart Attack). . Blood thinners: It is imperative that you stop these medications before procedures. Let us know if you if you take any blood thinner.  . Infection: Avoid procedures during or within two weeks of an infection (including chest colds or gastrointestinal problems). Symptoms associated with infections include: Localized redness, fever, chills, night sweats or profuse sweating, burning sensation when voiding, cough, congestion, stuffiness, runny nose, sore throat, diarrhea, nausea, vomiting, cold or Flu symptoms, recent or current infections. It is specially important if the infection is over the area that we intend to treat. . Heart and lung problems: Symptoms that may suggest an active cardiopulmonary problem include: cough, chest pain, breathing difficulties or shortness of breath, dizziness, ankle swelling, uncontrolled high or unusually low blood pressure, and/or palpitations. If you are experiencing any of these symptoms, cancel   your procedure and contact your primary care physician for an evaluation.  Remember:   Regular Business hours are:  Monday to Thursday 8:00 AM to 4:00 PM  Provider's Schedule: Milinda Pointer, MD:  Procedure days: Tuesday and Thursday 7:30 AM to 4:00 PM  Gillis Santa, MD:  Procedure days: Monday and Wednesday 7:30 AM to 4:00 PM ____________________________________________________________________________________________  ____________________________________________________________________________________________  Preparing for Procedure with Sedation  Instructions: . Oral Intake: Do not eat or drink anything for at least 8 hours prior to your procedure. . Transportation: Public transportation is not allowed. Bring an adult driver. The driver must be physically present in our waiting room before any procedure can be started. Marland Kitchen Physical Assistance: Bring an adult physically capable of assisting you, in the event you need help. This adult should keep you company at home for at least 6 hours after the procedure. . Blood Pressure Medicine: Take your blood pressure medicine with a sip of water the morning of the procedure. . Blood thinners: Notify our staff if you are taking any blood thinners. Depending on which one you take, there will be specific instructions on how and when to stop it. . Diabetics on insulin: Notify the staff so that you can be scheduled 1st case in the morning. If your diabetes requires high dose insulin, take only  of your normal insulin dose the morning of the procedure and notify the staff that you have done so. . Preventing infections: Shower with an antibacterial soap the morning of your procedure. . Build-up your immune system: Take 1000 mg of Vitamin C with every meal (3 times a day) the day prior to your procedure. Marland Kitchen Antibiotics: Inform the staff if you have a condition or reason that requires you to take antibiotics before dental procedures. . Pregnancy: If you are pregnant, call and cancel the procedure. . Sickness: If you have a cold, fever, or  any active infections, call and cancel the procedure. . Arrival: You must be in the facility at least 30 minutes prior to your scheduled procedure. . Children: Do not bring children with you. . Dress appropriately: Bring dark clothing that you would not mind if they get stained. . Valuables: Do not bring any jewelry or valuables.  Procedure appointments are reserved for interventional treatments only. Marland Kitchen No Prescription Refills. . No medication changes will be discussed during procedure appointments. . No disability issues will be discussed.  Reasons to call and reschedule or cancel your procedure: (Following these recommendations will minimize the risk of a serious complication.) . Surgeries: Avoid having procedures within 2 weeks of any surgery. (Avoid for 2 weeks before or after any surgery). . Flu Shots: Avoid having procedures within 2 weeks of a flu shots or . (Avoid for 2 weeks before or after immunizations). . Barium: Avoid having a procedure within 7-10 days after having had a radiological study involving the use of radiological contrast. (Myelograms, Barium swallow or enema study). . Heart attacks: Avoid any elective procedures or surgeries for the initial 6 months after a "Myocardial Infarction" (Heart Attack). . Blood thinners: It is imperative that you stop these medications before procedures. Let us know if you if you take any blood thinner.  . Infection: Avoid procedures during or within two weeks of an infection (including chest colds or gastrointestinal problems). Symptoms associated with infections include: Localized redness, fever, chills, night sweats or profuse sweating, burning sensation when voiding, cough, congestion, stuffiness, runny nose, sore throat, diarrhea, nausea, vomiting, cold or Flu symptoms, recent or  current infections. It is specially important if the infection is over the area that we intend to treat. Marland Kitchen Heart and lung problems: Symptoms that may suggest an  active cardiopulmonary problem include: cough, chest pain, breathing difficulties or shortness of breath, dizziness, ankle swelling, uncontrolled high or unusually low blood pressure, and/or palpitations. If you are experiencing any of these symptoms, cancel your procedure and contact your primary care physician for an evaluation.  Remember:  Regular Business hours are:  Monday to Thursday 8:00 AM to 4:00 PM  Provider's Schedule: Milinda Pointer, MD:  Procedure days: Tuesday and Thursday 7:30 AM to 4:00 PM  Gillis Santa, MD:  Procedure days: Monday and Wednesday 7:30 AM to 4:00 PM ____________________________________________________________________________________________  Facet Blocks Patient Information  Description: The facets are joints in the spine between the vertebrae.  Like any joints in the body, facets can become irritated and painful.  Arthritis can also effect the facets.  By injecting steroids and local anesthetic in and around these joints, we can temporarily block the nerve supply to them.  Steroids act directly on irritated nerves and tissues to reduce selling and inflammation which often leads to decreased pain.  Facet blocks may be done anywhere along the spine from the neck to the low back depending upon the location of your pain.   After numbing the skin with local anesthetic (like Novocaine), a small needle is passed onto the facet joints under x-ray guidance.  You may experience a sensation of pressure while this is being done.  The entire block usually lasts about 15-25 minutes.   Conditions which may be treated by facet blocks:   Low back/buttock pain  Neck/shoulder pain  Certain types of headaches  Preparation for the injection:  1. Do not eat any solid food or dairy products within 8 hours of your appointment. 2. You may drink clear liquid up to 3 hours before appointment.  Clear liquids include water, black coffee, juice or soda.  No milk or cream  please. 3. You may take your regular medication, including pain medications, with a sip of water before your appointment.  Diabetics should hold regular insulin (if taken separately) and take 1/2 normal NPH dose the morning of the procedure.  Carry some sugar containing items with you to your appointment. 4. A driver must accompany you and be prepared to drive you home after your procedure. 5. Bring all your current medications with you. 6. An IV may be inserted and sedation may be given at the discretion of the physician. 7. A blood pressure cuff, EKG and other monitors will often be applied during the procedure.  Some patients may need to have extra oxygen administered for a short period. 8. You will be asked to provide medical information, including your allergies and medications, prior to the procedure.  We must know immediately if you are taking blood thinners (like Coumadin/Warfarin) or if you are allergic to IV iodine contrast (dye).  We must know if you could possible be pregnant.  Possible side-effects:   Bleeding from needle site  Infection (rare, may require surgery)  Nerve injury (rare)  Numbness & tingling (temporary)  Difficulty urinating (rare, temporary)  Spinal headache (a headache worse with upright posture)  Light-headedness (temporary)  Pain at injection site (serveral days)  Decreased blood pressure (rare, temporary)  Weakness in arm/leg (temporary)  Pressure sensation in back/neck (temporary)   Call if you experience:   Fever/chills associated with headache or increased back/neck pain  Headache worsened by  an upright position  New onset, weakness or numbness of an extremity below the injection site  Hives or difficulty breathing (go to the emergency room)  Inflammation or drainage at the injection site(s)  Severe back/neck pain greater than usual  New symptoms which are concerning to you  Please note:  Although the local anesthetic injected  can often make your back or neck feel good for several hours after the injection, the pain will likely return. It takes 3-7 days for steroids to work.  You may not notice any pain relief for at least one week.  If effective, we will often do a series of 2-3 injections spaced 3-6 weeks apart to maximally decrease your pain.  After the initial series, you may be a candidate for a more permanent nerve block of the facets.  If you have any questions, please call #[phone]) [facility]  Patient instructed on pre sedation instructions. Will stop Xarelto x 3 days prior to procedure.

## 2018-04-15 ENCOUNTER — Ambulatory Visit: Payer: Non-veteran care | Admitting: Student in an Organized Health Care Education/Training Program

## 2018-04-30 DIAGNOSIS — L570 Actinic keratosis: Secondary | ICD-10-CM | POA: Diagnosis not present

## 2018-05-01 DIAGNOSIS — C779 Secondary and unspecified malignant neoplasm of lymph node, unspecified: Secondary | ICD-10-CM | POA: Diagnosis not present

## 2018-05-01 DIAGNOSIS — I890 Lymphedema, not elsewhere classified: Secondary | ICD-10-CM | POA: Diagnosis not present

## 2018-05-01 DIAGNOSIS — C770 Secondary and unspecified malignant neoplasm of lymph nodes of head, face and neck: Secondary | ICD-10-CM | POA: Diagnosis not present

## 2018-05-01 DIAGNOSIS — Z85810 Personal history of malignant neoplasm of tongue: Secondary | ICD-10-CM | POA: Diagnosis not present

## 2018-05-01 DIAGNOSIS — I429 Cardiomyopathy, unspecified: Secondary | ICD-10-CM | POA: Diagnosis not present

## 2018-05-01 DIAGNOSIS — E039 Hypothyroidism, unspecified: Secondary | ICD-10-CM | POA: Diagnosis not present

## 2018-05-01 DIAGNOSIS — Z9221 Personal history of antineoplastic chemotherapy: Secondary | ICD-10-CM | POA: Diagnosis not present

## 2018-05-01 DIAGNOSIS — K117 Disturbances of salivary secretion: Secondary | ICD-10-CM | POA: Diagnosis not present

## 2018-05-01 DIAGNOSIS — Z923 Personal history of irradiation: Secondary | ICD-10-CM | POA: Diagnosis not present

## 2018-05-01 DIAGNOSIS — Z08 Encounter for follow-up examination after completed treatment for malignant neoplasm: Secondary | ICD-10-CM | POA: Diagnosis not present

## 2018-05-01 DIAGNOSIS — C099 Malignant neoplasm of tonsil, unspecified: Secondary | ICD-10-CM | POA: Diagnosis not present

## 2018-05-01 DIAGNOSIS — Z87891 Personal history of nicotine dependence: Secondary | ICD-10-CM | POA: Diagnosis not present

## 2018-05-14 ENCOUNTER — Ambulatory Visit: Payer: Non-veteran care | Admitting: Student in an Organized Health Care Education/Training Program

## 2018-05-16 DIAGNOSIS — L570 Actinic keratosis: Secondary | ICD-10-CM | POA: Diagnosis not present

## 2018-05-16 DIAGNOSIS — L820 Inflamed seborrheic keratosis: Secondary | ICD-10-CM | POA: Diagnosis not present

## 2018-05-30 DIAGNOSIS — I428 Other cardiomyopathies: Secondary | ICD-10-CM | POA: Diagnosis not present

## 2018-05-30 DIAGNOSIS — R1031 Right lower quadrant pain: Secondary | ICD-10-CM | POA: Diagnosis not present

## 2018-05-30 DIAGNOSIS — I10 Essential (primary) hypertension: Secondary | ICD-10-CM | POA: Diagnosis not present

## 2018-05-30 DIAGNOSIS — I480 Paroxysmal atrial fibrillation: Secondary | ICD-10-CM | POA: Diagnosis not present

## 2018-06-02 DIAGNOSIS — I10 Essential (primary) hypertension: Secondary | ICD-10-CM | POA: Diagnosis not present

## 2018-06-02 DIAGNOSIS — M109 Gout, unspecified: Secondary | ICD-10-CM | POA: Diagnosis not present

## 2018-06-02 DIAGNOSIS — G5603 Carpal tunnel syndrome, bilateral upper limbs: Secondary | ICD-10-CM | POA: Diagnosis not present

## 2018-06-02 DIAGNOSIS — I4891 Unspecified atrial fibrillation: Secondary | ICD-10-CM | POA: Diagnosis not present

## 2018-06-04 ENCOUNTER — Other Ambulatory Visit: Payer: Self-pay

## 2018-06-04 ENCOUNTER — Encounter: Payer: Self-pay | Admitting: Student in an Organized Health Care Education/Training Program

## 2018-06-04 ENCOUNTER — Ambulatory Visit (HOSPITAL_BASED_OUTPATIENT_CLINIC_OR_DEPARTMENT_OTHER)
Payer: No Typology Code available for payment source | Admitting: Student in an Organized Health Care Education/Training Program

## 2018-06-04 ENCOUNTER — Ambulatory Visit
Admission: RE | Admit: 2018-06-04 | Discharge: 2018-06-04 | Disposition: A | Discharge status: Home / Self Care | Payer: No Typology Code available for payment source | Source: Ambulatory Visit | Attending: Student in an Organized Health Care Education/Training Program | Admitting: Student in an Organized Health Care Education/Training Program

## 2018-06-04 VITALS — BP 106/61 | HR 53 | Temp 98.1°F | Resp 13 | Ht 70.0 in | Wt 250.0 lb

## 2018-06-04 DIAGNOSIS — M47816 Spondylosis without myelopathy or radiculopathy, lumbar region: Secondary | ICD-10-CM | POA: Insufficient documentation

## 2018-06-04 DIAGNOSIS — Z791 Long term (current) use of non-steroidal anti-inflammatories (NSAID): Secondary | ICD-10-CM | POA: Insufficient documentation

## 2018-06-04 DIAGNOSIS — Z79899 Other long term (current) drug therapy: Secondary | ICD-10-CM | POA: Diagnosis not present

## 2018-06-04 DIAGNOSIS — Z96652 Presence of left artificial knee joint: Secondary | ICD-10-CM | POA: Diagnosis not present

## 2018-06-04 DIAGNOSIS — Z79890 Hormone replacement therapy (postmenopausal): Secondary | ICD-10-CM | POA: Diagnosis not present

## 2018-06-04 MED ORDER — LIDOCAINE HCL 2 % IJ SOLN
20.0000 mL | Freq: Once | INTRAMUSCULAR | Status: AC
  Administered 2018-06-04: 400 mg

## 2018-06-04 MED ORDER — LACTATED RINGERS IV SOLN
1000.0000 mL | Freq: Once | INTRAVENOUS | Status: AC
  Administered 2018-06-04: 1000 mL via INTRAVENOUS

## 2018-06-04 MED ORDER — DEXAMETHASONE SODIUM PHOSPHATE 10 MG/ML IJ SOLN
10.0000 mg | Freq: Once | INTRAMUSCULAR | Status: AC
  Administered 2018-06-04: 10 mg

## 2018-06-04 MED ORDER — DEXAMETHASONE SODIUM PHOSPHATE 10 MG/ML IJ SOLN
INTRAMUSCULAR | Status: AC
  Filled 2018-06-04: qty 1

## 2018-06-04 MED ORDER — LIDOCAINE HCL 2 % IJ SOLN
INTRAMUSCULAR | Status: AC
  Filled 2018-06-04: qty 20

## 2018-06-04 MED ORDER — FENTANYL CITRATE (PF) 100 MCG/2ML IJ SOLN
25.0000 ug | INTRAMUSCULAR | Status: DC | PRN
  Administered 2018-06-04: 50 ug via INTRAVENOUS
  Filled 2018-06-04: qty 2

## 2018-06-04 MED ORDER — ROPIVACAINE HCL 2 MG/ML IJ SOLN
10.0000 mL | Freq: Once | INTRAMUSCULAR | Status: AC
  Administered 2018-06-04: 10 mL
  Filled 2018-06-04: qty 10

## 2018-06-04 NOTE — Patient Instructions (Signed)

## 2018-06-04 NOTE — Progress Notes (Signed)
Patient's Name: [name]  MRN: [id number]  Referring Provider: Adin Hector, MD  DOB: Apr 04, 1966  PCP: Adin Hector, MD  DOS: 06/04/2018  Note by: Gillis Santa, MD  Service setting: Ambulatory outpatient  Specialty: Interventional Pain Management  Patient type: Established  Location: [facility] (AMB) Pain Management Facility  Visit type: Interventional Procedure   Primary Reason for Visit: Interventional Pain Management Treatment. CC: Procedure  Procedure:          Anesthesia, Analgesia, Anxiolysis:  Type: Lumbar Facet, Medial Branch Block(s) #2  Primary Purpose: Diagnostic Region: Posterolateral Lumbosacral Spine Level: L3, L4, L5, Medial Branch Level(s). Injecting these levels blocks the L3/4 and L4/5 lumbar facet joints. Laterality: Bilateral  Type: Moderate (Conscious) Sedation combined with Local Anesthesia Indication(s): Analgesia and Anxiety Route: Intravenous (IV) IV Access: Secured Sedation: Meaningful verbal contact was maintained at all times during the procedure  Local Anesthetic: Lidocaine 1-2%   Indications: 1. Lumbar spondylosis    Pain Score: Pre-procedure: 6 /10 Post-procedure: 0-No pain/10  Pre-op Assessment:  [name] is a 52 y.o. (year old), male patient, seen today for interventional treatment. He  has a past surgical history that includes Cardioversion (1989); Cardiac catheterization (03/2010); and Total knee arthroplasty (Left). [name] has a current medication list which includes the following prescription(s): allopurinol, atorvastatin, carvedilol, colchicine, fluoxetine, hydrocodone-acetaminophen, ibuprofen, levothyroxine, melatonin, multivitamin, sacubitril-valsartan, vitamin c, and rivaroxaban, and the following Facility-Administered Medications: fentanyl. His primarily concern today is the Procedure  Patient's last dose of Xarelto was on Saturday.  Initial Vital Signs:  Pulse/HCG Rate: 64ECG Heart Rate: 66 Temp: 98.1 F (36.7  C) Resp: 18 BP: 128/68 SpO2: 97 %  BMI: Estimated body mass index is 35.87 kg/m as calculated from the following:   Height as of this encounter: 5\' 10"  (1.778 m).   Weight as of this encounter: 250 lb (113.4 kg).  Risk Assessment: Allergies: Reviewed. He has No Known Allergies.  Allergy Precautions: None required Coagulopathies: Reviewed. None identified.  Blood-thinner therapy: None at this time Active Infection(s): Reviewed. None identified. [name] is afebrile  Site Confirmation: [name] was asked to confirm the procedure and laterality before marking the site Procedure checklist: Completed Consent: Before the procedure and under the influence of no sedative(s), amnesic(s), or anxiolytics, the patient was informed of the treatment options, risks and possible complications. To fulfill our ethical and legal obligations, as recommended by the American Medical Association's Code of Ethics, I have informed the patient of my clinical impression; the nature and purpose of the treatment or procedure; the risks, benefits, and possible complications of the intervention; the alternatives, including doing nothing; the risk(s) and benefit(s) of the alternative treatment(s) or procedure(s); and the risk(s) and benefit(s) of doing nothing. The patient was provided information about the general risks and possible complications associated with the procedure. These may include, but are not limited to: failure to achieve desired goals, infection, bleeding, organ or nerve damage, allergic reactions, paralysis, and death. In addition, the patient was informed of those risks and complications associated to Spine-related procedures, such as failure to decrease pain; infection (i.e.: Meningitis, epidural or intraspinal abscess); bleeding (i.e.: epidural hematoma, subarachnoid hemorrhage, or any other type of intraspinal or peri-dural bleeding); organ or nerve damage (i.e.: Any type of peripheral nerve, nerve  root, or spinal cord injury) with subsequent damage to sensory, motor, and/or autonomic systems, resulting in permanent pain, numbness, and/or weakness of one or several areas of the body; allergic reactions; (i.e.: anaphylactic reaction);  and/or death. Furthermore, the patient was informed of those risks and complications associated with the medications. These include, but are not limited to: allergic reactions (i.e.: anaphylactic or anaphylactoid reaction(s)); adrenal axis suppression; blood sugar elevation that in diabetics may result in ketoacidosis or comma; water retention that in patients with history of congestive heart failure may result in shortness of breath, pulmonary edema, and decompensation with resultant heart failure; weight gain; swelling or edema; medication-induced neural toxicity; particulate matter embolism and blood vessel occlusion with resultant organ, and/or nervous system infarction; and/or aseptic necrosis of one or more joints. Finally, the patient was informed that Medicine is not an exact science; therefore, there is also the possibility of unforeseen or unpredictable risks and/or possible complications that may result in a catastrophic outcome. The patient indicated having understood very clearly. We have given the patient no guarantees and we have made no promises. Enough time was given to the patient to ask questions, all of which were answered to the patient's satisfaction. [name] has indicated that he wanted to continue with the procedure. Attestation: I, the ordering provider, attest that I have discussed with the patient the benefits, risks, side-effects, alternatives, likelihood of achieving goals, and potential problems during recovery for the procedure that I have provided informed consent. Date  Time: 06/04/2018  8:19 AM  Pre-Procedure Preparation:  Monitoring: As per clinic protocol. Respiration, ETCO2, SpO2, BP, heart rate and rhythm monitor placed and checked  for adequate function Safety Precautions: Patient was assessed for positional comfort and pressure points before starting the procedure. Time-out: I initiated and conducted the "Time-out" before starting the procedure, as per protocol. The patient was asked to participate by confirming the accuracy of the "Time Out" information. Verification of the correct person, site, and procedure were performed and confirmed by me, the nursing staff, and the patient. "Time-out" conducted as per Joint Commission's Universal Protocol (UP.01.01.01). Time: 0921  Description of Procedure:          Position: Prone Laterality: Bilateral. The procedure was performed in identical fashion on both sides. Levels:   L3, L4, L5, Medial Branch Level(s) Area Prepped: Posterior Lumbosacral Region Prepping solution: ChloraPrep (2% chlorhexidine gluconate and 70% isopropyl alcohol) Safety Precautions: Aspiration looking for blood return was conducted prior to all injections. At no point did we inject any substances, as a needle was being advanced. Before injecting, the patient was told to immediately notify me if he was experiencing any new onset of "ringing in the ears, or metallic taste in the mouth". No attempts were made at seeking any paresthesias. Safe injection practices and needle disposal techniques used. Medications properly checked for expiration dates. SDV (single dose vial) medications used. After the completion of the procedure, all disposable equipment used was discarded in the proper designated medical waste containers. Local Anesthesia: Protocol guidelines were followed. The patient was positioned over the fluoroscopy table. The area was prepped in the usual manner. The time-out was completed. The target area was identified using fluoroscopy. A 12-in long, straight, sterile hemostat was used with fluoroscopic guidance to locate the targets for each level blocked. Once located, the skin was marked with an approved  surgical skin marker. Once all sites were marked, the skin (epidermis, dermis, and hypodermis), as well as deeper tissues (fat, connective tissue and muscle) were infiltrated with a small amount of a short-acting local anesthetic, loaded on a 10cc syringe with a 25G, 1.5-in  Needle. An appropriate amount of time was allowed for local anesthetics to take  effect before proceeding to the next step. Local Anesthetic: Lidocaine 2.0% The unused portion of the local anesthetic was discarded in the proper designated containers. Technical explanation of process:   L3 Medial Branch Nerve Block (MBB): The target area for the L3 medial branch is at the junction of the postero-lateral aspect of the superior articular process and the superior, posterior, and medial edge of the transverse process of L4. Under fluoroscopic guidance, a Quincke needle was inserted until contact was made with os over the superior postero-lateral aspect of the pedicular shadow (target area). After negative aspiration for blood, 1.5 mL of the nerve block solution was injected without difficulty or complication. The needle was removed intact. L4 Medial Branch Nerve Block (MBB): The target area for the L4 medial branch is at the junction of the postero-lateral aspect of the superior articular process and the superior, posterior, and medial edge of the transverse process of L5. Under fluoroscopic guidance, a Quincke needle was inserted until contact was made with os over the superior postero-lateral aspect of the pedicular shadow (target area). After negative aspiration for blood, 1.5 mL of the nerve block solution was injected without difficulty or complication. The needle was removed intact. L5 Medial Branch Nerve Block (MBB): The target area for the L5 medial branch is at the junction of the postero-lateral aspect of the superior articular process and the superior, posterior, and medial edge of the sacral ala. Under fluoroscopic guidance, a  Quincke needle was inserted until contact was made with os over the superior postero-lateral aspect of the pedicular shadow (target area). After negative aspiration for blood,1.73mL of the nerve block solution was injected without difficulty or complication. The needle was removed intact.  Procedural Needles: 22-gauge, 3.5-inch, Quincke needles used for all levels. Nerve block solution: 10 cc solution made of 9 cc of 0.2% ropivacaine, 1 cc of Decadron 10 mg/cc.  1.5 cc injected at each level above bilaterally.  The unused portion of the solution was discarded in the proper designated containers.  Once the entire procedure was completed, the treated area was cleaned, making sure to leave some of the prepping solution back to take advantage of its long term bactericidal properties.   Illustration of the posterior view of the lumbar spine and the posterior neural structures. Laminae of L2 through S1 are labeled. DPRL5, dorsal primary ramus of L5; DPRS1, dorsal primary ramus of S1; DPR3, dorsal primary ramus of L3; FJ, facet (zygapophyseal) joint L3-L4; I, inferior articular process of L4; LB1, lateral branch of dorsal primary ramus of L1; IAB, inferior articular branches from L3 medial branch (supplies L4-L5 facet joint); IBP, intermediate branch plexus; MB3, medial branch of dorsal primary ramus of L3; NR3, third lumbar nerve root; S, superior articular process of L5; SAB, superior articular branches from L4 (supplies L4-5 facet joint also); TP3, transverse process of L3.  Vitals:   06/04/18 0940 06/04/18 0950 06/04/18 0959 06/04/18 1000  BP: 105/61 105/70 (!) 104/57 106/61  Pulse: (!) 53     Resp: 14 10 12 13   Temp:      SpO2: 98% 98% 98% 100%  Weight:      Height:        Start Time: 0921 hrs. End Time: 0936 hrs.  Imaging Guidance (Spinal):          Type of Imaging Technique: Fluoroscopy Guidance (Spinal) Indication(s): Assistance in needle guidance and placement for procedures requiring  needle placement in or near specific anatomical locations not easily accessible without such  assistance. Exposure Time: Please see nurses notes. Contrast: None used. Fluoroscopic Guidance: I was personally present during the use of fluoroscopy. "Tunnel Vision Technique" used to obtain the best possible view of the target area. Parallax error corrected before commencing the procedure. "Direction-depth-direction" technique used to introduce the needle under continuous pulsed fluoroscopy. Once target was reached, antero-posterior, oblique, and lateral fluoroscopic projection used confirm needle placement in all planes. Images permanently stored in EMR. Interpretation: No contrast injected. I personally interpreted the imaging intraoperatively. Adequate needle placement confirmed in multiple planes. Permanent images saved into the patient's record.  Antibiotic Prophylaxis:   Anti-infectives (From admission, onward)   None     Indication(s): None identified  Post-operative Assessment:  Post-procedure Vital Signs:  Pulse/HCG Rate: (!) 53(!) 58 Temp: 98.1 F (36.7 C) Resp: 13 BP: 106/61 SpO2: 100 %  EBL: None  Complications: No immediate post-treatment complications observed by team, or reported by patient.  Note: The patient tolerated the entire procedure well. A repeat set of vitals were taken after the procedure and the patient was kept under observation following institutional policy, for this type of procedure. Post-procedural neurological assessment was performed, showing return to baseline, prior to discharge. The patient was provided with post-procedure discharge instructions, including a section on how to identify potential problems. Should any problems arise concerning this procedure, the patient was given instructions to immediately contact us, at any time, without hesitation. In any case, we plan to contact the patient by telephone for a follow-up status report regarding this  interventional procedure.  Comments:  No additional relevant information. 5 out of 5 strength bilateral lower extremity: Plantar flexion, dorsiflexion, knee flexion, knee extension.  Plan of Care   Imaging Orders     DG C-Arm 1-60 Min-No Report Procedure Orders    No procedure(s) ordered today   Patient can restart Xarelto tomorrow after nursing staff calls him for postprocedural assessment and as long as patient is not having any new lower extreme any weakness.  Patient endorsed understanding. Medications ordered for procedure: Meds ordered this encounter  Medications  . lactated ringers infusion 1,000 mL  . fentaNYL (SUBLIMAZE) injection 25-100 mcg    Make sure Narcan is available in the pyxis when using this medication. In the event of respiratory depression (RR< 8/min): Titrate NARCAN (naloxone) in increments of 0.1 to 0.2 mg IV at 2-3 minute intervals, until desired degree of reversal.  . ropivacaine (PF) 2 mg/mL (0.2%) (NAROPIN) injection 10 mL  . lidocaine (XYLOCAINE) 2 % (with pres) injection 400 mg  . dexamethasone (DECADRON) injection 10 mg  . dexamethasone (DECADRON) injection 10 mg   Medications administered: We administered lactated ringers, fentaNYL, ropivacaine (PF) 2 mg/mL (0.2%), lidocaine, dexamethasone, and dexamethasone.  See the medical record for exact dosing, route, and time of administration.  New Prescriptions   No medications on file   Disposition: Discharge home  Discharge Date & Time: 06/04/2018; 1011 hrs.   Physician-requested Follow-up: Return in about 6 weeks (around 07/16/2018) for Post Procedure Evaluation.  Future Appointments  Date Time Provider [place]  07/16/2018  1:30 PM Gillis Santa, MD [facility] None   Primary Care Physician: Adin Hector, MD Location: [facility] Note by: Gillis Santa, MD Date: 06/04/2018; Time: 12:35 PM  Disclaimer:  Medicine is not an exact science. The only  guarantee in medicine is that nothing is guaranteed. It is important to note that the decision to proceed with this intervention was based on the information collected  from the patient. The Data and conclusions were drawn from the patient's questionnaire, the interview, and the physical examination. Because the information was provided in large part by the patient, it cannot be guaranteed that it has not been purposely or unconsciously manipulated. Every effort has been made to obtain as much relevant data as possible for this evaluation. It is important to note that the conclusions that lead to this procedure are derived in large part from the available data. Always take into account that the treatment will also be dependent on availability of resources and existing treatment guidelines, considered by other Pain Management Practitioners as being common knowledge and practice, at the time of the intervention. For Medico-Legal purposes, it is also important to point out that variation in procedural techniques and pharmacological choices are the acceptable norm. The indications, contraindications, technique, and results of the above procedure should only be interpreted and judged by a Board-Certified Interventional Pain Specialist with extensive familiarity and expertise in the same exact procedure and technique.

## 2018-06-04 NOTE — Progress Notes (Signed)
Safety precautions to be maintained throughout the outpatient stay will include: orient to surroundings, keep bed in low position, maintain call bell within reach at all times, provide assistance with transfer out of bed and ambulation.  

## 2018-06-05 ENCOUNTER — Telehealth: Payer: Self-pay

## 2018-06-05 NOTE — Telephone Encounter (Signed)
Post procedure phone call.  Patient states he is doing good.  

## 2018-06-25 DIAGNOSIS — L814 Other melanin hyperpigmentation: Secondary | ICD-10-CM | POA: Diagnosis not present

## 2018-06-25 DIAGNOSIS — L820 Inflamed seborrheic keratosis: Secondary | ICD-10-CM | POA: Diagnosis not present

## 2018-06-25 DIAGNOSIS — B078 Other viral warts: Secondary | ICD-10-CM | POA: Diagnosis not present

## 2018-06-25 DIAGNOSIS — L821 Other seborrheic keratosis: Secondary | ICD-10-CM | POA: Diagnosis not present

## 2018-06-25 DIAGNOSIS — L570 Actinic keratosis: Secondary | ICD-10-CM | POA: Diagnosis not present

## 2018-06-26 IMAGING — CT NM PET TUM IMG INITIAL (PI) SKULL BASE T - THIGH
1 of 10 series · 1 of 25 positions shown · non-contrast
Comparison: CT from 05/07/2017.

CLINICAL DATA: Initial Treatment strategy for tonsil mass and lung
nodule..

EXAM:
NUCLEAR MEDICINE PET SKULL BASE TO THIGH
TECHNIQUE: 13.14 mCi F-18 FDG was injected intravenously. Full-ring PET imaging
was performed from the skull base to thigh after the radiotracer. CT
data was obtained and used for attenuation correction and anatomic
localization.
FASTING BLOOD GLUCOSE:  Value: 106 mg/dl

[Series 3: ct wb 5.0 b30f · axial · 5.0mm · 0.98mm/px · 1 of 329 slices shown]
[im 329/329  brain]
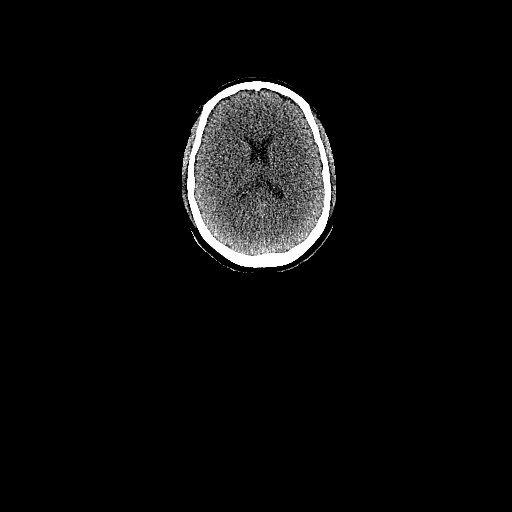

[1 of 25 positions shown; findings below may reference images not displayed]

FINDINGS: NECK: Intense asymmetric increased radiotracer uptake localizing to
the left tonsillar mass is identified. SUV max equals 20.68.
Enlarged left level-IIa lymph node exhibits increased uptake within
SUV max equal to 11.3. Left level IIb node measures 1 cm and has an
SUV max equal to 11.86. No hypermetabolic lymph nodes within the
right side of neck.

CHEST: No hypermetabolic mediastinal or hilar nodes. No suspicious
pulmonary nodules on the CT scan.

ABDOMEN/PELVIS: No abnormal hypermetabolic activity within the
liver, pancreas, adrenal glands, or spleen. No hypermetabolic lymph
nodes in the abdomen or pelvis.

SKELETON: No focal hypermetabolic activity to suggest skeletal
metastasis.
IMPRESSION: 1. Intense FDG uptake localizes to the large left tonsillar mass
compatible with primary left tonsillar carcinoma.
2. Hypermetabolic ipsilateral nodal metastasis.
3. No evidence for distant metastatic disease.

## 2018-07-16 ENCOUNTER — Ambulatory Visit: Payer: Non-veteran care | Admitting: Student in an Organized Health Care Education/Training Program

## 2018-07-24 DIAGNOSIS — Z08 Encounter for follow-up examination after completed treatment for malignant neoplasm: Secondary | ICD-10-CM | POA: Diagnosis not present

## 2018-07-24 DIAGNOSIS — C099 Malignant neoplasm of tonsil, unspecified: Secondary | ICD-10-CM | POA: Diagnosis not present

## 2018-07-24 DIAGNOSIS — Z9221 Personal history of antineoplastic chemotherapy: Secondary | ICD-10-CM | POA: Diagnosis not present

## 2018-07-24 DIAGNOSIS — Z923 Personal history of irradiation: Secondary | ICD-10-CM | POA: Diagnosis not present

## 2018-07-24 DIAGNOSIS — I4891 Unspecified atrial fibrillation: Secondary | ICD-10-CM | POA: Diagnosis not present

## 2018-07-24 DIAGNOSIS — Z79899 Other long term (current) drug therapy: Secondary | ICD-10-CM | POA: Diagnosis not present

## 2018-07-24 DIAGNOSIS — R1011 Right upper quadrant pain: Secondary | ICD-10-CM | POA: Diagnosis not present

## 2018-07-24 DIAGNOSIS — I10 Essential (primary) hypertension: Secondary | ICD-10-CM | POA: Diagnosis not present

## 2018-07-24 DIAGNOSIS — J383 Other diseases of vocal cords: Secondary | ICD-10-CM | POA: Diagnosis not present

## 2018-07-24 DIAGNOSIS — E039 Hypothyroidism, unspecified: Secondary | ICD-10-CM | POA: Diagnosis not present

## 2018-07-24 DIAGNOSIS — I429 Cardiomyopathy, unspecified: Secondary | ICD-10-CM | POA: Diagnosis not present

## 2018-07-24 DIAGNOSIS — I781 Nevus, non-neoplastic: Secondary | ICD-10-CM | POA: Diagnosis not present

## 2018-07-24 DIAGNOSIS — Z85818 Personal history of malignant neoplasm of other sites of lip, oral cavity, and pharynx: Secondary | ICD-10-CM | POA: Diagnosis not present

## 2018-07-24 DIAGNOSIS — C770 Secondary and unspecified malignant neoplasm of lymph nodes of head, face and neck: Secondary | ICD-10-CM | POA: Diagnosis not present

## 2018-07-24 DIAGNOSIS — I890 Lymphedema, not elsewhere classified: Secondary | ICD-10-CM | POA: Diagnosis not present

## 2018-07-28 DIAGNOSIS — J383 Other diseases of vocal cords: Secondary | ICD-10-CM | POA: Diagnosis not present

## 2018-07-28 DIAGNOSIS — R490 Dysphonia: Secondary | ICD-10-CM | POA: Diagnosis not present

## 2018-07-28 DIAGNOSIS — Z85819 Personal history of malignant neoplasm of unspecified site of lip, oral cavity, and pharynx: Secondary | ICD-10-CM | POA: Diagnosis not present

## 2018-08-07 DIAGNOSIS — I890 Lymphedema, not elsewhere classified: Secondary | ICD-10-CM | POA: Diagnosis not present

## 2018-08-07 DIAGNOSIS — M542 Cervicalgia: Secondary | ICD-10-CM | POA: Diagnosis not present

## 2018-08-08 DIAGNOSIS — L570 Actinic keratosis: Secondary | ICD-10-CM | POA: Diagnosis not present

## 2018-08-08 DIAGNOSIS — B078 Other viral warts: Secondary | ICD-10-CM | POA: Diagnosis not present

## 2018-08-08 DIAGNOSIS — L578 Other skin changes due to chronic exposure to nonionizing radiation: Secondary | ICD-10-CM | POA: Diagnosis not present

## 2018-08-15 DIAGNOSIS — I890 Lymphedema, not elsewhere classified: Secondary | ICD-10-CM | POA: Diagnosis not present

## 2018-08-15 DIAGNOSIS — M542 Cervicalgia: Secondary | ICD-10-CM | POA: Diagnosis not present

## 2018-09-01 DIAGNOSIS — I890 Lymphedema, not elsewhere classified: Secondary | ICD-10-CM | POA: Diagnosis not present

## 2018-09-01 DIAGNOSIS — M542 Cervicalgia: Secondary | ICD-10-CM | POA: Diagnosis not present

## 2018-09-02 ENCOUNTER — Ambulatory Visit
Payer: No Typology Code available for payment source | Admitting: Student in an Organized Health Care Education/Training Program

## 2018-09-08 DIAGNOSIS — I10 Essential (primary) hypertension: Secondary | ICD-10-CM | POA: Diagnosis not present

## 2018-09-08 DIAGNOSIS — E7849 Other hyperlipidemia: Secondary | ICD-10-CM | POA: Diagnosis not present

## 2018-09-10 ENCOUNTER — Encounter: Payer: Self-pay | Admitting: Student in an Organized Health Care Education/Training Program

## 2018-09-10 DIAGNOSIS — R1011 Right upper quadrant pain: Secondary | ICD-10-CM | POA: Diagnosis not present

## 2018-09-10 DIAGNOSIS — C099 Malignant neoplasm of tonsil, unspecified: Secondary | ICD-10-CM | POA: Diagnosis not present

## 2018-09-10 DIAGNOSIS — Z9221 Personal history of antineoplastic chemotherapy: Secondary | ICD-10-CM | POA: Diagnosis not present

## 2018-09-10 DIAGNOSIS — Z923 Personal history of irradiation: Secondary | ICD-10-CM | POA: Diagnosis not present

## 2018-09-11 ENCOUNTER — Encounter: Payer: Self-pay | Admitting: Student in an Organized Health Care Education/Training Program

## 2018-09-11 ENCOUNTER — Other Ambulatory Visit: Payer: Self-pay

## 2018-09-11 ENCOUNTER — Ambulatory Visit
Payer: BLUE CROSS/BLUE SHIELD | Attending: Student in an Organized Health Care Education/Training Program | Admitting: Student in an Organized Health Care Education/Training Program

## 2018-09-11 VITALS — BP 128/71 | HR 64 | Temp 97.4°F | Resp 14 | Ht 70.0 in | Wt 250.0 lb

## 2018-09-11 DIAGNOSIS — M5136 Other intervertebral disc degeneration, lumbar region: Secondary | ICD-10-CM | POA: Diagnosis not present

## 2018-09-11 DIAGNOSIS — L570 Actinic keratosis: Secondary | ICD-10-CM | POA: Diagnosis not present

## 2018-09-11 DIAGNOSIS — G894 Chronic pain syndrome: Secondary | ICD-10-CM | POA: Insufficient documentation

## 2018-09-11 DIAGNOSIS — L821 Other seborrheic keratosis: Secondary | ICD-10-CM | POA: Diagnosis not present

## 2018-09-11 DIAGNOSIS — M47816 Spondylosis without myelopathy or radiculopathy, lumbar region: Secondary | ICD-10-CM | POA: Insufficient documentation

## 2018-09-11 NOTE — Progress Notes (Signed)
Safety precautions to be maintained throughout the outpatient stay will include: orient to surroundings, keep bed in low position, maintain call bell within reach at all times, provide assistance with transfer out of bed and ambulation.  

## 2018-09-11 NOTE — Patient Instructions (Addendum)
Stop Xarelto 3 days prior ____________________________________________________________________________________________  Preparing for Procedure with Sedation  Instructions: . Oral Intake: Do not eat or drink anything for at least 8 hours prior to your procedure. . Transportation: Public transportation is not allowed. Bring an adult driver. The driver must be physically present in our waiting room before any procedure can be started. Mar[place] Physical Assistance: Bring an adult physically capable of assisting you, in the event you need help. This adult should keep you company at home for at least 6 hours after the procedure. . Blood Pressure Medicine: Take your blood pressure medicine with a sip of water the morning of the procedure. . Blood thinners: Notify our staff if you are taking any blood thinners. Depending on which one you take, there will be specific instructions on how and when to stop it. . Diabetics on insulin: Notify the staff so that you can be scheduled 1st case in the morning. If your diabetes requires high dose insulin, take only  of your normal insulin dose the morning of the procedure and notify the staff that you have done so. . Preventing infections: Shower with an antibacterial soap the morning of your procedure. . Build-up your immune system: Take 1000 mg of Vitamin C with every meal (3 times a day) the day prior to your procedure. Marland Kitchen Antibiotics: Inform the staff if you have a condition or reason that requires you to take antibiotics before dental procedures. . Pregnancy: If you are pregnant, call and cancel the procedure. . Sickness: If you have a cold, fever, or any active infections, call and cancel the procedure. . Arrival: You must be in the facility at least 30 minutes prior to your scheduled procedure. . Children: Do not bring children with you. . Dress appropriately: Bring dark clothing that you would not mind if they get stained. . Valuables: Do not bring any jewelry or  valuables.  Procedure appointments are reserved for interventional treatments only. Marland Kitchen No Prescription Refills. . No medication changes will be discussed during procedure appointments. . No disability issues will be discussed.  Reasons to call and reschedule or cancel your procedure: (Following these recommendations will minimize the risk of a serious complication.) . Surgeries: Avoid having procedures within 2 weeks of any surgery. (Avoid for 2 weeks before or after any surgery). . Flu Shots: Avoid having procedures within 2 weeks of a flu shots or . (Avoid for 2 weeks before or after immunizations). . Barium: Avoid having a procedure within 7-10 days after having had a radiological study involving the use of radiological contrast. (Myelograms, Barium swallow or enema study). . Heart attacks: Avoid any elective procedures or surgeries for the initial 6 months after a "Myocardial Infarction" (Heart Attack). . Blood thinners: It is imperative that you stop these medications before procedures. Let us know if you if you take any blood thinner.  . Infection: Avoid procedures during or within two weeks of an infection (including chest colds or gastrointestinal problems). Symptoms associated with infections include: Localized redness, fever, chills, night sweats or profuse sweating, burning sensation when voiding, cough, congestion, stuffiness, runny nose, sore throat, diarrhea, nausea, vomiting, cold or Flu symptoms, recent or current infections. It is specially important if the infection is over the area that we intend to treat. Marland Kitchen Heart and lung problems: Symptoms that may suggest an active cardiopulmonary problem include: cough, chest pain, breathing difficulties or shortness of breath, dizziness, ankle swelling, uncontrolled high or unusually low blood pressure, and/or palpitations. If you are experiencing  any of these symptoms, cancel your procedure and contact your primary care physician for an  evaluation.  Remember:  Regular Business hours are:  Monday to Thursday 8:00 AM to 4:00 PM  Provider's Schedule: Milinda Pointer, MD:  Procedure days: Tuesday and Thursday 7:30 AM to 4:00 PM  Gillis Santa, MD:  Procedure days: Monday and Wednesday 7:30 AM to 4:00 PM ____________________________________________________________________________________________

## 2018-09-11 NOTE — Progress Notes (Signed)
Patient's Name: [name]  MRN: [id number]  Referring Provider: Adin Hector, MD  DOB: 04/16/1966  PCP: Adin Hector, MD  DOS: 09/11/2018  Note by: Gillis Santa, MD  Service setting: Ambulatory outpatient  Specialty: Interventional Pain Management  Location: [facility] (AMB) Pain Management Facility    Patient type: Established   Primary Reason(s) for Visit: Encounter for post-procedure evaluation of chronic illness with mild to moderate exacerbation CC: Back Pain (lower)  HPI  [name] is a 53 y.o. year old, male patient, who comes today for a post-procedure evaluation. He has Hyperlipemia; GOUT; Mood disorder ([place]); Essential hypertension; ATRIAL FIBRILLATION; GERD; FATTY LIVER DISEASE; Long term current use of anticoagulant; Cardiomyopathy, nonischemic ([place]); Obesity; Ventricular tachycardia, non-sustained//PVCs-Sx; Preventative health care; Primary gout; Skin lesion; Chronic low back pain with right-sided sciatica; Acute bronchitis; Cough, persistent; Acute pain of right knee; Patellar tendinitis of left knee; Left ear pain; Lumbar spondylosis; Lumbar facet arthropathy; and Chronic pain syndrome on their problem list. His primarily concern today is the Back Pain (lower)  Pain Assessment: Location: Lower Back Radiating: side of left leg to the knee Onset: More than a month ago Duration: Chronic pain Quality: Aching, Dull Severity: 4 /10 (subjective, self-reported pain score)  Note: Reported level is compatible with observation.                         When using our objective Pain Scale, levels between 6 and 10/10 are said to belong in an emergency room, as it progressively worsens from a 6/10, described as severely limiting, requiring emergency care not usually available at an outpatient pain management facility. At a 6/10 level, communication becomes difficult and requires great effort. Assistance to reach the emergency department may be required. Facial flushing and profuse  sweating along with potentially dangerous increases in heart rate and blood pressure will be evident. Effect on ADL:   Timing: Constant Modifying factors: Ibuprofen BP: 128/71  HR: 64  [name] comes in today for post-procedure evaluation.  Further details on both, my assessment(s), as well as the proposed treatment plan, please see below.  Post-Procedure Assessment  06/04/2018 Procedure: bilateral L3, L4, L5 facet medial branch nerve block #2 Pre-procedure pain score:  6/10 Post-procedure pain score: 0/10         Influential Factors: BMI: 35.87 kg/m Intra-procedural challenges: None observed.         Assessment challenges: None detected.              Reported side-effects: None.        Post-procedural adverse reactions or complications: None reported         Sedation: Please see nurses note. When no sedatives are used, the analgesic levels obtained are directly associated to the effectiveness of the local anesthetics. However, when sedation is provided, the level of analgesia obtained during the initial 1 hour following the intervention, is believed to be the result of a combination of factors. These factors may include, but are not limited to: 1. The effectiveness of the local anesthetics used. 2. The effects of the analgesic(s) and/or anxiolytic(s) used. 3. The degree of discomfort experienced by the patient at the time of the procedure. 4. The patients ability and reliability in recalling and recording the events. 5. The presence and influence of possible secondary gains and/or psychosocial factors. Reported result: Relief experienced during the 1st hour after the procedure: 75 % (Ultra-Short Term Relief)  Interpretative annotation: Clinically appropriate result. Analgesia during this period is likely to be Local Anesthetic and/or IV Sedative (Analgesic/Anxiolytic) related.          Effects of local anesthetic: The analgesic effects attained during this period are  directly associated to the localized infiltration of local anesthetics and therefore cary significant diagnostic value as to the etiological location, or anatomical origin, of the pain. Expected duration of relief is directly dependent on the pharmacodynamics of the local anesthetic used. Long-acting (4-6 hours) anesthetics used.  Reported result: Relief during the next 4 to 6 hour after the procedure: 75 % (Short-Term Relief)            Interpretative annotation: Clinically appropriate result. Analgesia during this period is likely to be Local Anesthetic-related.          Long-term benefit: Defined as the period of time past the expected duration of local anesthetics (1 hour for short-acting and 4-6 hours for long-acting). With the possible exception of prolonged sympathetic blockade from the local anesthetics, benefits during this period are typically attributed to, or associated with, other factors such as analgesic sensory neuropraxia, antiinflammatory effects, or beneficial biochemical changes provided by agents other than the local anesthetics.  Reported result: Extended relief following procedure: 75 %(lasted 30 days) (Long-Term Relief)            Interpretative annotation: Clinically possible results. Good relief. No permanent benefit expected. Inflammation plays a part in the etiology to the pain.          Current benefits: Defined as reported results that persistent at this point in time.   Analgesia: 25-50 %            Function: Somewhat improved ROM: Somewhat improved Interpretative annotation: Recurrence of symptoms. No permanent benefit expected. Effective diagnostic intervention.          Interpretation: Results would suggest a successful diagnostic intervention.                  Plan:  Proceed with Radiofrequency Ablation for the purpose of attaining long-term benefits.                Laboratory Chemistry  Inflammation Markers (CRP: Acute Phase) (ESR: Chronic Phase) Lab Results   Component Value Date   CRP 2.7 07/27/2014   ESRSEDRATE 9 07/27/2014                         Rheumatology Markers Lab Results  Component Value Date   LABURIC 6.5 06/26/2016                        Renal Function Markers Lab Results  Component Value Date   BUN 15 12/16/2017   CREATININE 0.97 12/16/2017   BCR 15 12/16/2017   GFRAA 104 12/16/2017   GFRNONAA 90 12/16/2017                             Hepatic Function Markers Lab Results  Component Value Date   AST 34 05/09/2017   ALT 34 05/09/2017   ALBUMIN 4.5 05/09/2017   ALKPHOS 66 05/09/2017   HCVAB NEG 10/29/2008   AMYLASE 41 11/06/2008   LIPASE 19 11/06/2008                        Electrolytes Lab Results  Component Value Date   NA 139  12/16/2017   K 4.8 12/16/2017   CL 101 12/16/2017   CALCIUM 9.4 12/16/2017   MG 2.1 04/16/2013   PHOS 2.5 06/13/2010                        Neuropathy Markers Lab Results  Component Value Date   HGBA1C 5.5 08/27/2011                        CNS Tests No results found for: COLORCSF, APPEARCSF, RBCCOUNTCSF, WBCCSF, POLYSCSF, LYMPHSCSF, EOSCSF, PROTEINCSF, GLUCCSF, JCVIRUS, CSFOLI, IGGCSF                      Bone Pathology Markers No results found for: VD25OH, [id number], [id number], TK2409BD5, 25OHVITD1, 25OHVITD2, 25OHVITD3, TESTOFREE, TESTOSTERONE                       Coagulation Parameters Lab Results  Component Value Date   INR 1.05 05/13/2017   LABPROT 13.6 05/13/2017   PLT 263 05/09/2017                        Cardiovascular Markers Lab Results  Component Value Date   TROPONINI < 0.02 02/20/2013   HGB 13.3 05/09/2017   HCT 38.0 (L) 05/09/2017                         CA Markers No results found for: CEA, CA125, LABCA2                      Endocrine Markers Lab Results  Component Value Date   TSH 4.066 05/09/2017   FREET4 0.70 06/26/2016                        Note: Lab results reviewed.  Recent Diagnostic Imaging Results  DG C-Arm 1-60 Min-No  Report Fluoroscopy was utilized by the requesting physician.  No radiographic  interpretation.   Complexity Note: Imaging results reviewed. Results shared with [name], using Layman's terms.                         Meds   Current Outpatient Medications:  .  allopurinol (ZYLOPRIM) 100 MG tablet, Take 300 mg by mouth daily., Disp: , Rfl:  .  atorvastatin (LIPITOR) 20 MG tablet, take 1 tablet by mouth once daily, Disp: 90 tablet, Rfl: 2 .  FLUoxetine (PROZAC) 20 MG capsule, Take 1 capsule (20 mg total) by mouth daily., Disp: 90 capsule, Rfl: 0 .  HYDROcodone-acetaminophen (NORCO/VICODIN) 5-325 MG tablet, take 1 to 2 tablets by mouth every 4 hours if needed, Disp: , Rfl: 0 .  Ibuprofen 200 MG CAPS, Take 800 mg by mouth 2 (two) times daily as needed. , Disp: , Rfl:  .  levothyroxine (SYNTHROID, LEVOTHROID) 50 MCG tablet, Take 50 mcg by mouth daily before breakfast., Disp: , Rfl:  .  Melatonin 5 MG CAPS, Take 10 mg by mouth at bedtime. Takes 2 capsules po at bedtime for sleep., Disp: , Rfl:  .  Multiple Vitamin (MULTIVITAMIN) tablet, Take 1 tablet by mouth daily., Disp: , Rfl:  .  rivaroxaban (XARELTO) 20 MG TABS tablet, Take 1 tablet (20 mg total) by mouth daily., Disp: 30 tablet, Rfl: 5 .  sacubitril-valsartan (ENTRESTO) 49-51 MG, Take 1 tablet by mouth 2 (two)  times daily., Disp: 60 tablet, Rfl: 11 .  vitamin C (ASCORBIC ACID) 500 MG tablet, Take 500 mg by mouth daily. , Disp: , Rfl:   ROS  Constitutional: Denies any fever or chills Gastrointestinal: No reported hemesis, hematochezia, vomiting, or acute GI distress Musculoskeletal: Denies any acute onset joint swelling, redness, loss of ROM, or weakness Neurological: No reported episodes of acute onset apraxia, aphasia, dysarthria, agnosia, amnesia, paralysis, loss of coordination, or loss of consciousness  Allergies  [name] has No Known Allergies.  [place]  Drug: [name]  reports no history of drug use. Alcohol:  reports current  alcohol use of about 2.0 - 3.0 standard drinks of alcohol per week. Tobacco:  reports that he has never smoked. He has never used smokeless tobacco. Medical:  has a past medical history of Anxiety, Arthritis, Atrial fibrillation ([place]) (1989), Bicuspid aortic valve, Cardiomyopathy (08/2009), Depression, GERD (gastroesophageal reflux disease), Gout, Heat stroke (1998), Hyperlipidemia, Hypertension, and Seizures ([place]) (1998). Surgical: [name]  has a past surgical history that includes Cardioversion (1989); Cardiac catheterization (03/2010); and Total knee arthroplasty (Left). Family: family history includes Arthritis in his mother; COPD in his mother; Coronary artery disease in his paternal grandfather; Crohn's disease in his brother; Depression in his mother; Diabetes in his mother; Heart attack in his father; Lung cancer in his maternal grandfather, paternal uncle, and paternal uncle; Pancreatic cancer in his paternal uncle; Thyroid cancer in his paternal aunt.  Constitutional Exam  General appearance: Well nourished, well developed, and well hydrated. In no apparent acute distress Vitals:   09/11/18 1141  BP: 128/71  Pulse: 64  Resp: 14  Temp: (!) 97.4 F (36.3 C)  TempSrc: Oral  SpO2: 100%  Weight: 250 lb (113.4 kg)  Height: _0  (1.778 m)   BMI Assessment: Estimated body mass index is 35.87 kg/m as calculated from the following:   Height as of this encounter: _1  (1.778 m).   Weight as of this encounter: 250 lb (113.4 kg).  BMI interpretation table: BMI level Category Range association with higher incidence of chronic pain  <18 kg/m2 Underweight   18.5-24.9 kg/m2 Ideal body weight   25-29.9 kg/m2 Overweight Increased incidence by 20%  30-34.9 kg/m2 Obese (Class I) Increased incidence by 68%  35-39.9 kg/m2 Severe obesity (Class II) Increased incidence by 136%  >40 kg/m2 Extreme obesity (Class III) Increased incidence by 254%   Patient's current BMI Ideal Body weight  Body  mass index is 35.87 kg/m. Ideal body weight: 73 kg (160 lb 15 oz) Adjusted ideal body weight: 89.2 kg (196 lb 9 oz)   BMI Readings from Last 4 Encounters:  09/11/18 35.87 kg/m  06/04/18 35.87 kg/m  04/14/18 33.47 kg/m  03/24/18 33.47 kg/m   Wt Readings from Last 4 Encounters:  09/11/18 250 lb (113.4 kg)  06/04/18 250 lb (113.4 kg)  04/14/18 240 lb (108.9 kg)  03/24/18 240 lb (108.9 kg)  Psych/Mental status: Alert, oriented x 3 (person, place, & time)       Eyes: PERLA Respiratory: No evidence of acute respiratory distress  Cervical Spine Area Exam  Skin & Axial Inspection: No masses, redness, edema, swelling, or associated skin lesions Alignment: Symmetrical Functional ROM: Unrestricted ROM      Stability: No instability detected Muscle Tone/Strength: Functionally intact. No obvious neuro-muscular anomalies detected. Sensory (Neurological): Unimpaired Palpation: No palpable anomalies              Upper Extremity (UE) Exam    Side: Right upper  extremity  Side: Left upper extremity  Skin & Extremity Inspection: Skin color, temperature, and hair growth are WNL. No peripheral edema or cyanosis. No masses, redness, swelling, asymmetry, or associated skin lesions. No contractures.  Skin & Extremity Inspection: Skin color, temperature, and hair growth are WNL. No peripheral edema or cyanosis. No masses, redness, swelling, asymmetry, or associated skin lesions. No contractures.  Functional ROM: Unrestricted ROM          Functional ROM: Unrestricted ROM          Muscle Tone/Strength: Functionally intact. No obvious neuro-muscular anomalies detected.  Muscle Tone/Strength: Functionally intact. No obvious neuro-muscular anomalies detected.  Sensory (Neurological): Unimpaired          Sensory (Neurological): Unimpaired          Palpation: No palpable anomalies              Palpation: No palpable anomalies              Provocative Test(s):  Phalen's test: deferred Tinel's test:  deferred Apley's scratch test (touch opposite shoulder):  Action 1 (Across chest): deferred Action 2 (Overhead): deferred Action 3 (LB reach): deferred   Provocative Test(s):  Phalen's test: deferred Tinel's test: deferred Apley's scratch test (touch opposite shoulder):  Action 1 (Across chest): deferred Action 2 (Overhead): deferred Action 3 (LB reach): deferred    Thoracic Spine Area Exam  Skin & Axial Inspection: No masses, redness, or swelling Alignment: Symmetrical Functional ROM: Unrestricted ROM Stability: No instability detected Muscle Tone/Strength: Functionally intact. No obvious neuro-muscular anomalies detected. Sensory (Neurological): Unimpaired Muscle strength & Tone: No palpable anomalies  Lumbar Spine Area Exam  Skin & Axial Inspection: No masses, redness, or swelling Alignment: Symmetrical Functional ROM: Pain restricted ROM       Stability: No instability detected Muscle Tone/Strength: Functionally intact. No obvious neuro-muscular anomalies detected. Sensory (Neurological): Musculoskeletal pain pattern Palpation: No palpable anomalies       Provocative Tests: Hyperextension/rotation test: (+) bilaterally for facet joint pain. Lumbar quadrant test (Kemp's test): (+) bilaterally for facet joint pain. Lateral bending test: deferred today       Patrick's Maneuver: deferred today                   FABER* test: deferred today                   S-I anterior distraction/compression test: deferred today         S-I lateral compression test: deferred today         S-I Thigh-thrust test: deferred today         S-I Gaenslen's test: deferred today         *(Flexion, ABduction and External Rotation)  Gait & Posture Assessment  Ambulation: Unassisted Gait: Relatively normal for age and body habitus Posture: WNL   Lower Extremity Exam    Side: Right lower extremity  Side: Left lower extremity  Stability: No instability observed          Stability: No instability  observed          Skin & Extremity Inspection: Skin color, temperature, and hair growth are WNL. No peripheral edema or cyanosis. No masses, redness, swelling, asymmetry, or associated skin lesions. No contractures.  Skin & Extremity Inspection: Skin color, temperature, and hair growth are WNL. No peripheral edema or cyanosis. No masses, redness, swelling, asymmetry, or associated skin lesions. No contractures.  Functional ROM: Unrestricted ROM  Functional ROM: Unrestricted ROM                  Muscle Tone/Strength: Functionally intact. No obvious neuro-muscular anomalies detected.  Muscle Tone/Strength: Functionally intact. No obvious neuro-muscular anomalies detected.  Sensory (Neurological): Unimpaired        Sensory (Neurological): Unimpaired        DTR: Patellar: deferred today Achilles: deferred today Plantar: deferred today  DTR: Patellar: deferred today Achilles: deferred today Plantar: deferred today  Palpation: No palpable anomalies  Palpation: No palpable anomalies   Assessment  Primary Diagnosis & Pertinent Problem List: The primary encounter diagnosis was Lumbar spondylosis. Diagnoses of Lumbar facet arthropathy, Chronic pain syndrome, and Lumbar degenerative disc disease were also pertinent to this visit.  Status Diagnosis  Responding Responding Responding 1. Lumbar spondylosis   2. Lumbar facet arthropathy   3. Chronic pain syndrome   4. Lumbar degenerative disc disease     Problems updated and reviewed during this visit: Problem  Lumbar Spondylosis  Lumbar Facet Arthropathy  Chronic Pain Syndrome    Patient is status post 2 diagnostic bilateral lumbar facet medial branch nerve blocks at L3, L4, L5 both of which provided him with significant pain relief, greater than 80% for approximately 30 days with gradual return of pain thereafter.  Patient is interested in proceeding with lumbar radiofrequency ablation.  Risks and benefits of this procedure were  discussed and patient like to proceed.  We will have patient discontinue his Xarelto for 3 days prior to scheduled procedure.  Patient endorsed understanding.  We will start with the right side first.  Patient also endorsing left-sided hip pain that started approximately 30 days ago.  Patient has been engaging in a new exercise.  Patient could be experiencing musculoskeletal strain, iliotibial band syndrome, possible GTB bursitis.  We will continue to follow.   Plan: -Right L3, L4, L5 RFA followed by left -Patient instructed to discontinue Xarelto 3 days prior.  Lab-work, procedure(s), and/or referral(s): Orders Placed This Encounter  Procedures  . Radiofrequency,Lumbar    Time Note: Greater than 50% of the 25 minute(s) of face-to-face time spent with [name], was spent in counseling/coordination of care regarding: [name] primary cause of pain, the treatment plan, treatment alternatives, the risks and possible complications of proposed treatment, the results, interpretation and significance of  his recent diagnostic interventional treatment(s) and realistic expectations.  Provider-requested follow-up: Return in about 2 weeks (around 09/25/2018) for Procedure.  No future appointments.  Primary Care Physician: Adin Hector, MD Location: [facility] Note by: Gillis Santa, M.D Date: 09/11/2018; Time: 2:55 PM  Patient Instructions  Stop Xarelto 3 days prior ____________________________________________________________________________________________  Preparing for Procedure with Sedation  Instructions: . Oral Intake: Do not eat or drink anything for at least 8 hours prior to your procedure. . Transportation: Public transportation is not allowed. Bring an adult driver. The driver must be physically present in our waiting room before any procedure can be started. Marland Kitchen Physical Assistance: Bring an adult physically capable of assisting you, in the event  you need help. This adult should keep you company at home for at least 6 hours after the procedure. . Blood Pressure Medicine: Take your blood pressure medicine with a sip of water the morning of the procedure. . Blood thinners: Notify our staff if you are taking any blood thinners. Depending on which one you take, there will be specific instructions on how and when to stop it. . Diabetics on  insulin: Notify the staff so that you can be scheduled 1st case in the morning. If your diabetes requires high dose insulin, take only  of your normal insulin dose the morning of the procedure and notify the staff that you have done so. . Preventing infections: Shower with an antibacterial soap the morning of your procedure. . Build-up your immune system: Take 1000 mg of Vitamin C with every meal (3 times a day) the day prior to your procedure. Marland Kitchen Antibiotics: Inform the staff if you have a condition or reason that requires you to take antibiotics before dental procedures. . Pregnancy: If you are pregnant, call and cancel the procedure. . Sickness: If you have a cold, fever, or any active infections, call and cancel the procedure. . Arrival: You must be in the facility at least 30 minutes prior to your scheduled procedure. . Children: Do not bring children with you. . Dress appropriately: Bring dark clothing that you would not mind if they get stained. . Valuables: Do not bring any jewelry or valuables.  Procedure appointments are reserved for interventional treatments only. Marland Kitchen No Prescription Refills. . No medication changes will be discussed during procedure appointments. . No disability issues will be discussed.  Reasons to call and reschedule or cancel your procedure: (Following these recommendations will minimize the risk of a serious complication.) . Surgeries: Avoid having procedures within 2 weeks of any surgery. (Avoid for 2 weeks before or after any surgery). . Flu Shots: Avoid having procedures  within 2 weeks of a flu shots or . (Avoid for 2 weeks before or after immunizations). . Barium: Avoid having a procedure within 7-10 days after having had a radiological study involving the use of radiological contrast. (Myelograms, Barium swallow or enema study). . Heart attacks: Avoid any elective procedures or surgeries for the initial 6 months after a "Myocardial Infarction" (Heart Attack). . Blood thinners: It is imperative that you stop these medications before procedures. Let us know if you if you take any blood thinner.  . Infection: Avoid procedures during or within two weeks of an infection (including chest colds or gastrointestinal problems). Symptoms associated with infections include: Localized redness, fever, chills, night sweats or profuse sweating, burning sensation when voiding, cough, congestion, stuffiness, runny nose, sore throat, diarrhea, nausea, vomiting, cold or Flu symptoms, recent or current infections. It is specially important if the infection is over the area that we intend to treat. Marland Kitchen Heart and lung problems: Symptoms that may suggest an active cardiopulmonary problem include: cough, chest pain, breathing difficulties or shortness of breath, dizziness, ankle swelling, uncontrolled high or unusually low blood pressure, and/or palpitations. If you are experiencing any of these symptoms, cancel your procedure and contact your primary care physician for an evaluation.  Remember:  Regular Business hours are:  Monday to Thursday 8:00 AM to 4:00 PM  Provider's Schedule: Milinda Pointer, MD:  Procedure days: Tuesday and Thursday 7:30 AM to 4:00 PM  Gillis Santa, MD:  Procedure days: Monday and Wednesday 7:30 AM to 4:00 PM ____________________________________________________________________________________________

## 2018-09-12 DIAGNOSIS — I480 Paroxysmal atrial fibrillation: Secondary | ICD-10-CM | POA: Diagnosis not present

## 2018-09-12 DIAGNOSIS — E039 Hypothyroidism, unspecified: Secondary | ICD-10-CM | POA: Diagnosis not present

## 2018-09-12 DIAGNOSIS — I10 Essential (primary) hypertension: Secondary | ICD-10-CM | POA: Diagnosis not present

## 2018-09-12 DIAGNOSIS — I428 Other cardiomyopathies: Secondary | ICD-10-CM | POA: Diagnosis not present

## 2018-09-17 ENCOUNTER — Other Ambulatory Visit: Payer: Self-pay | Admitting: Internal Medicine

## 2018-09-17 DIAGNOSIS — R1031 Right lower quadrant pain: Secondary | ICD-10-CM

## 2018-09-19 DIAGNOSIS — I10 Essential (primary) hypertension: Secondary | ICD-10-CM | POA: Diagnosis not present

## 2018-09-19 DIAGNOSIS — I480 Paroxysmal atrial fibrillation: Secondary | ICD-10-CM | POA: Diagnosis not present

## 2018-09-19 DIAGNOSIS — Z1159 Encounter for screening for other viral diseases: Secondary | ICD-10-CM | POA: Diagnosis not present

## 2018-09-19 DIAGNOSIS — E785 Hyperlipidemia, unspecified: Secondary | ICD-10-CM | POA: Diagnosis not present

## 2018-09-19 DIAGNOSIS — F329 Major depressive disorder, single episode, unspecified: Secondary | ICD-10-CM | POA: Diagnosis not present

## 2018-09-19 DIAGNOSIS — C4442 Squamous cell carcinoma of skin of scalp and neck: Secondary | ICD-10-CM | POA: Diagnosis not present

## 2018-09-19 DIAGNOSIS — Z0189 Encounter for other specified special examinations: Secondary | ICD-10-CM | POA: Diagnosis not present

## 2018-09-26 ENCOUNTER — Ambulatory Visit: Payer: No Typology Code available for payment source

## 2018-10-06 ENCOUNTER — Ambulatory Visit
Admission: RE | Admit: 2018-10-06 | Discharge: 2018-10-06 | Disposition: A | Discharge status: Home / Self Care | Payer: BLUE CROSS/BLUE SHIELD | Source: Ambulatory Visit | Attending: Internal Medicine | Admitting: Internal Medicine

## 2018-10-06 DIAGNOSIS — R1031 Right lower quadrant pain: Secondary | ICD-10-CM | POA: Diagnosis not present

## 2018-10-06 MED ORDER — IOHEXOL 300 MG/ML  SOLN
100.0000 mL | Freq: Once | INTRAMUSCULAR | Status: AC | PRN
  Administered 2018-10-06: 100 mL via INTRAVENOUS

## 2018-10-20 ENCOUNTER — Ambulatory Visit (HOSPITAL_BASED_OUTPATIENT_CLINIC_OR_DEPARTMENT_OTHER)
Payer: No Typology Code available for payment source | Admitting: Student in an Organized Health Care Education/Training Program

## 2018-10-20 ENCOUNTER — Other Ambulatory Visit: Payer: Self-pay

## 2018-10-20 ENCOUNTER — Ambulatory Visit
Admission: RE | Admit: 2018-10-20 | Discharge: 2018-10-20 | Disposition: A | Discharge status: Home / Self Care | Payer: No Typology Code available for payment source | Source: Ambulatory Visit | Attending: Student in an Organized Health Care Education/Training Program | Admitting: Student in an Organized Health Care Education/Training Program

## 2018-10-20 ENCOUNTER — Encounter: Payer: Self-pay | Admitting: Student in an Organized Health Care Education/Training Program

## 2018-10-20 DIAGNOSIS — M47816 Spondylosis without myelopathy or radiculopathy, lumbar region: Secondary | ICD-10-CM | POA: Diagnosis present

## 2018-10-20 MED ORDER — ROPIVACAINE HCL 2 MG/ML IJ SOLN
9.0000 mL | Freq: Once | INTRAMUSCULAR | Status: AC
  Administered 2018-10-20: 9 mL via PERINEURAL

## 2018-10-20 MED ORDER — LIDOCAINE HCL 2 % IJ SOLN
20.0000 mL | Freq: Once | INTRAMUSCULAR | Status: AC
  Administered 2018-10-20: 400 mg

## 2018-10-20 MED ORDER — FENTANYL CITRATE (PF) 100 MCG/2ML IJ SOLN
25.0000 ug | INTRAMUSCULAR | Status: DC | PRN
  Administered 2018-10-20: 50 ug via INTRAVENOUS

## 2018-10-20 MED ORDER — LACTATED RINGERS IV SOLN: 1000.0000 mL | Freq: Once | INTRAVENOUS | Status: DC

## 2018-10-20 MED ORDER — LIDOCAINE HCL 2 % IJ SOLN
INTRAMUSCULAR | Status: AC
  Filled 2018-10-20: qty 20

## 2018-10-20 MED ORDER — ROPIVACAINE HCL 2 MG/ML IJ SOLN
INTRAMUSCULAR | Status: AC
  Filled 2018-10-20: qty 10

## 2018-10-20 MED ORDER — DEXAMETHASONE SODIUM PHOSPHATE 10 MG/ML IJ SOLN
10.0000 mg | Freq: Once | INTRAMUSCULAR | Status: AC
  Administered 2018-10-20: 10 mg

## 2018-10-20 MED ORDER — DEXAMETHASONE SODIUM PHOSPHATE 10 MG/ML IJ SOLN
INTRAMUSCULAR | Status: AC
  Filled 2018-10-20: qty 1

## 2018-10-20 MED ORDER — FENTANYL CITRATE (PF) 100 MCG/2ML IJ SOLN
INTRAMUSCULAR | Status: AC
  Filled 2018-10-20: qty 2

## 2018-10-20 NOTE — Patient Instructions (Addendum)
Follow up for Left L3, L4, L5 RFA, stop Xarelto 3 days priorPost-procedure Information What to expect: Most procedures involve the use of a local anesthetic (numbing medicine), and a steroid (anti-inflammatory medicine).  The local anesthetics may cause temporary numbness and weakness of the legs or arms, depending on the location of the block. This numbness/weakness may last 4-6 hours, depending on the local anesthetic used. In rare instances, it can last up to 24 hours. While numb, you must be very careful not to injure the extremity.  After any procedure, you could expect the pain to get better within 15-20 minutes. This relief is temporary and may last 4-6 hours. Once the local anesthetics wears off, you could experience discomfort, possibly more than usual, for up to 10 (ten) days. In the case of radiofrequencies, it may last up to 6 weeks. Surgeries may take up to 8 weeks for the healing process. The discomfort is due to the irritation caused by needles going through skin and muscle. To minimize the discomfort, we recommend using ice the first day, and heat from then on. The ice should be applied for 15 minutes on, and 15 minutes off. Keep repeating this cycle until bedtime. Avoid applying the ice directly to the skin, to prevent frostbite. Heat should be used daily, until the pain improves (4-10 days). Be careful not to burn yourself.  Occasionally you may experience muscle spasms or cramps. These occur as a consequence of the irritation caused by the needle sticks to the muscle and the blood that will inevitably be lost into the surrounding muscle tissue. Blood tends to be very irritating to tissues, which tend to react by going into spasm. These spasms may start the same day of your procedure, but they may also take days to develop. This late onset type of spasm or cramp is usually caused by electrolyte imbalances triggered by the steroids, at the level of the kidney. Cramps and spasms tend to  respond well to muscle relaxants, multivitamins (some are triggered by the procedure, but may have their origins in vitamin deficiencies), and "Gatorade", or any sports drinks that can replenish any electrolyte imbalances. (If you are a diabetic, ask your pharmacist to get you a sugar-free brand.) Warm showers or baths may also be helpful. Stretching exercises are highly recommended. General Instructions:  Be alert for signs of possible infection: redness, swelling, heat, red streaks, elevated temperature, and/or fever. These typically appear 4 to 6 days after the procedure. Immediately notify your doctor if you experience unusual bleeding, difficulty breathing, or loss of bowel or bladder control. If you experience increased pain, do not increase your pain medicine intake, unless instructed by your pain physician. Post-Procedure Care:  Be careful in moving about. Muscle spasms in the area of the injection may occur. Applying ice or heat to the area is often helpful. The incidence of spinal headaches after epidural injections ranges between 1.4% and 6%. If you develop a headache that does not seem to respond to conservative therapy, please let your physician know. This can be treated with an epidural blood patch.   Post-procedure numbness or redness is to be expected, however it should average 4 to 6 hours. If numbness and weakness of your extremities begins to develop 4 to 6 hours after your procedure, and is felt to be progressing and worsening, immediately contact your physician.   Diet:  If you experience nausea, do not eat until this sensation goes away. If you had a "Stellate Ganglion Block" for  upper extremity "Reflex Sympathetic Dystrophy", do not eat or drink until your hoarseness goes away. In any case, always start with liquids first and if you tolerate them well, then slowly progress to more solid foods. Activity:  For the first 4 to 6 hours after the procedure, use caution in moving about as  you may experience numbness and/or weakness. Use caution in cooking, using household electrical appliances, and climbing steps. If you need to reach your Doctor call our office: [phone]) [phone] Monday-Thursday 8:00 am - 4:00 PM    Fridays: Closed     In case of an emergency: In case of emergency, call 911 or go to the nearest emergency room and have the physician there call us.  Interpretation of Procedure Every nerve block has two components: a diagnostic component, and a treatment component. Unrealistic expectations are the most common causes of "perceived failure".  In a perfect world, a single nerve block should be able to completely and permanently eliminate the pain. Sadly, the world is not perfect.  Most pain management nerve blocks are performed using local anesthetics and steroids. Steroids are responsible for any long-term benefit that you may experience. Their purpose is to decrease any chronic swelling that may exist in the area. Steroids begin to work immediately after being injected. However, most patients will not experience any benefits until 5 to 10 days after the injection, when the swelling has come down to the point where they can tell a difference. Steroids will only help if there is swelling to be treated. As such, they can assist with the diagnosis. If effective, they suggest an inflammatory component to the pain, and if ineffective, they rule out inflammation as the main cause or component of the problem. If the problem is one of mechanical compression, you will get no benefit from those steroids.   In the case of local anesthetics, they have a crucial role in the diagnosis of your condition. Most will begin to work within15 to 20 minutes after injection. The duration will depend on the type used (short- vs. Long-acting). It is of outmost importance that patients keep tract of their pain, after the procedure. To assist with this matter, a "Post-procedure Pain Diary" is provided.  Make sure to complete it and to bring it back to your follow-up appointment.  As long as the patient keeps accurate, detailed records of their symptoms after every procedure, and returns to have those interpreted, every procedure will provide [place] with invaluable information. Even a block that does not provide the patient with any relief, will always provide [place] with information about the mechanism and the origin of the pain. The only time a nerve block can be considered a waste of time is when patients do not keep track of the results, or do not keep their post-procedure appointment.  Reporting the results back to your physician The Pain Score  Pain is a subjective complaint. It cannot be seen, touched, or measured. We depend entirely on the patient's report of the pain in order to assess your condition and treatment. To evaluate the pain, we use a pain scale, where "0" means "No Pain", and a "10" is "the worst possible pain that you can even imagine" (i.e. something like been eaten alive by a shark or being torn apart by a lion).   You will frequently be asked to rate your pain. Please be as accurate, remember that medical decisions will be based on your responses. Please do not rate your pain above  a 10. Doing so is actually interpreted as "symptom magnification" (exaggeration), as well as lack of understanding with regards to the scale. To put this into perspective, when you tell us that your pain is at a 10 (ten), what you are saying is that there is nothing we can do to make this pain any worse. (Carefully think about that.)Preparing for Procedure with Sedation Instructions: . Oral Intake: Do not eat or drink anything for at least 8 hours prior to your procedure. . Transportation: Public transportation is not allowed. Bring an adult driver. The driver must be physically present in our waiting room before any procedure can be started. [facility] Physical Assistance: Bring an adult capable of physically  assisting you, in the event you need help. . Blood Pressure Medicine: Take your blood pressure medicine with a sip of water the morning of the procedure. . Insulin: Take only  of your normal insulin dose. . Preventing infections: Shower with an antibacterial soap the morning of your procedure. . Build-up your immune system: Take 1000 mg of Vitamin C with every meal (3 times a day) the day prior to your procedure. . Pregnancy: If you are pregnant, call and cancel the procedure. . Sickness: If you have a cold, fever, or any active infections, call and cancel the procedure. . Arrival: You must be in the facility at least 30 minutes prior to your scheduled procedure. . Children: Do not bring children with you. . Dress appropriately: Bring dark clothing that you would not mind if they get stained. . Valuables: Do not bring any jewelry or valuables. Procedure appointments are reserved for interventional treatments only. Marland Kitchen No Prescription Refills. . No medication changes will be discussed during procedure appointments. No disability issues will be discussed.

## 2018-10-20 NOTE — Progress Notes (Signed)
Safety precautions to be maintained throughout the outpatient stay will include: orient to surroundings, keep bed in low position, maintain call bell within reach at all times, provide assistance with transfer out of bed and ambulation.  

## 2018-10-20 NOTE — Progress Notes (Signed)
Patient's Name: [name]  MRN: [id number]  Referring Provider: Gillis Santa, MD  DOB: February 02, 1966  PCP: Adin Hector, MD  DOS: 10/20/2018  Note by: Gillis Santa, MD  Service setting: Ambulatory outpatient  Specialty: Interventional Pain Management  Patient type: Established  Location: [facility] (AMB) Pain Management Facility  Visit type: Interventional Procedure   Primary Reason for Visit: Interventional Pain Management Treatment. CC: Back Pain (lower)  Procedure:          Anesthesia, Analgesia, Anxiolysis:  Type: Thermal Lumbar Facet, Medial Branch Radiofrequency Ablation/Neurotomy           Primary Purpose: Therapeutic Region: Posterolateral Lumbosacral Spine Level:  L3, L4, L5, Medial Branch Level(s). These levels will denervate the L3-4, L4-5,lumbar facet joints. Laterality: Right  Type: Moderate (Conscious) Sedation combined with Local Anesthesia Indication(s): Analgesia and Anxiety Route: Intravenous (IV) IV Access: Secured Sedation: Meaningful verbal contact was maintained at all times during the procedure  Local Anesthetic: Lidocaine 1-2%  Position: Prone   Indications: 1. Lumbar spondylosis    [name] has been dealing with the above chronic pain for longer than three months and has either failed to respond, was unable to tolerate, or simply did not get enough benefit from other more conservative therapies including, but not limited to: 1. Over-the-counter medications 2. Anti-inflammatory medications 3. Muscle relaxants 4. Membrane stabilizers 5. Opioids 6. Physical therapy and/or chiropractic manipulation 7. Modalities (Heat, ice, etc.) 8. Invasive techniques such as nerve blocks. Mr. [name] has attained more than 50% relief of the pain from a series of diagnostic injections conducted in separate occasions.  Pain Score: Pre-procedure: 4 /10 Post-procedure: 0-No pain/10  Stopped Xarelto 3 days ago, last dose Thurs evening.  Pre-op Assessment:  Mr.  [name] is a 53 y.o. (year old), male patient, seen today for interventional treatment. He  has a past surgical history that includes Cardioversion (1989); Cardiac catheterization (03/2010); and Total knee arthroplasty (Left). [name] has a current medication list which includes the following prescription(s): allopurinol, atorvastatin, fluoxetine, ibuprofen, levothyroxine, melatonin, multivitamin, sacubitril-valsartan, vitamin c, hydrocodone-acetaminophen, and rivaroxaban, and the following Facility-Administered Medications: fentanyl and lactated ringers. His primarily concern today is the Back Pain (lower)  Initial Vital Signs:  Pulse/HCG Rate: 64ECG Heart Rate: 67 Temp: 98.5 F (36.9 C) Resp: 16 BP: 108/74 SpO2: 99 %  BMI: Estimated body mass index is 35.87 kg/m as calculated from the following:   Height as of this encounter: 5\' 10"  (1.778 m).   Weight as of this encounter: 250 lb (113.4 kg).  Risk Assessment: Allergies: Reviewed. He has No Known Allergies.  Allergy Precautions: None required Coagulopathies: Reviewed. None identified.  Blood-thinner therapy: None at this time Active Infection(s): Reviewed. None identified. [name] is afebrile  Site Confirmation: [name] was asked to confirm the procedure and laterality before marking the site Procedure checklist: Completed Consent: Before the procedure and under the influence of no sedative(s), amnesic(s), or anxiolytics, the patient was informed of the treatment options, risks and possible complications. To fulfill our ethical and legal obligations, as recommended by the American Medical Association's Code of Ethics, I have informed the patient of my clinical impression; the nature and purpose of the treatment or procedure; the risks, benefits, and possible complications of the intervention; the alternatives, including doing nothing; the risk(s) and benefit(s) of the alternative treatment(s) or procedure(s); and the risk(s) and  benefit(s) of doing nothing. The patient was provided information about the general risks and possible complications associated with the procedure.  These may include, but are not limited to: failure to achieve desired goals, infection, bleeding, organ or nerve damage, allergic reactions, paralysis, and death. In addition, the patient was informed of those risks and complications associated to Spine-related procedures, such as failure to decrease pain; infection (i.e.: Meningitis, epidural or intraspinal abscess); bleeding (i.e.: epidural hematoma, subarachnoid hemorrhage, or any other type of intraspinal or peri-dural bleeding); organ or nerve damage (i.e.: Any type of peripheral nerve, nerve root, or spinal cord injury) with subsequent damage to sensory, motor, and/or autonomic systems, resulting in permanent pain, numbness, and/or weakness of one or several areas of the body; allergic reactions; (i.e.: anaphylactic reaction); and/or death. Furthermore, the patient was informed of those risks and complications associated with the medications. These include, but are not limited to: allergic reactions (i.e.: anaphylactic or anaphylactoid reaction(s)); adrenal axis suppression; blood sugar elevation that in diabetics may result in ketoacidosis or comma; water retention that in patients with history of congestive heart failure may result in shortness of breath, pulmonary edema, and decompensation with resultant heart failure; weight gain; swelling or edema; medication-induced neural toxicity; particulate matter embolism and blood vessel occlusion with resultant organ, and/or nervous system infarction; and/or aseptic necrosis of one or more joints. Finally, the patient was informed that Medicine is not an exact science; therefore, there is also the possibility of unforeseen or unpredictable risks and/or possible complications that may result in a catastrophic outcome. The patient indicated having understood very  clearly. We have given the patient no guarantees and we have made no promises. Enough time was given to the patient to ask questions, all of which were answered to the patient's satisfaction. [name] has indicated that he wanted to continue with the procedure. Attestation: I, the ordering provider, attest that I have discussed with the patient the benefits, risks, side-effects, alternatives, likelihood of achieving goals, and potential problems during recovery for the procedure that I have provided informed consent. Date   Time: 10/20/2018  8:02 AM  Pre-Procedure Preparation:  Monitoring: As per clinic protocol. Respiration, ETCO2, SpO2, BP, heart rate and rhythm monitor placed and checked for adequate function Safety Precautions: Patient was assessed for positional comfort and pressure points before starting the procedure. Time-out: I initiated and conducted the "Time-out" before starting the procedure, as per protocol. The patient was asked to participate by confirming the accuracy of the "Time Out" information. Verification of the correct person, site, and procedure were performed and confirmed by me, the nursing staff, and the patient. "Time-out" conducted as per Joint Commission's Universal Protocol (UP.01.01.01). Time: 45  Description of Procedure:          Laterality: Right Levels: L3, L4, L5,Medial Branch Level(s), at the L3-4, L4-5,  lumbar facet joints. Area Prepped: Lumbosacral Prepping solution: ChloraPrep (2% chlorhexidine gluconate and 70% isopropyl alcohol) Safety Precautions: Aspiration looking for blood return was conducted prior to all injections. At no point did we inject any substances, as a needle was being advanced. Before injecting, the patient was told to immediately notify me if he was experiencing any new onset of "ringing in the ears, or metallic taste in the mouth". No attempts were made at seeking any paresthesias. Safe injection practices and needle disposal  techniques used. Medications properly checked for expiration dates. SDV (single dose vial) medications used. After the completion of the procedure, all disposable equipment used was discarded in the proper designated medical waste containers. Local Anesthesia: Protocol guidelines were followed. The patient was positioned over the fluoroscopy table. The  area was prepped in the usual manner. The time-out was completed. The target area was identified using fluoroscopy. A 12-in long, straight, sterile hemostat was used with fluoroscopic guidance to locate the targets for each level blocked. Once located, the skin was marked with an approved surgical skin marker. Once all sites were marked, the skin (epidermis, dermis, and hypodermis), as well as deeper tissues (fat, connective tissue and muscle) were infiltrated with a small amount of a short-acting local anesthetic, loaded on a 10cc syringe with a 25G, 1.5-in  Needle. An appropriate amount of time was allowed for local anesthetics to take effect before proceeding to the next step. Local Anesthetic: Lidocaine 2.0% The unused portion of the local anesthetic was discarded in the proper designated containers. Technical explanation of process:  Radiofrequency Ablation (RFA)  L3 Medial Branch Nerve RFA: The target area for the L3 medial branch is at the junction of the postero-lateral aspect of the superior articular process and the superior, posterior, and medial edge of the transverse process of L4. Under fluoroscopic guidance, a Radiofrequency needle was inserted until contact was made with os over the superior postero-lateral aspect of the pedicular shadow (target area). Sensory and motor testing was conducted to properly adjust the position of the needle. Once satisfactory placement of the needle was achieved, the numbing solution was slowly injected after negative aspiration for blood. 71mL of the nerve block solution was injected without difficulty or  complication. After waiting for at least 3 minutes, the ablation was performed. Once completed, the needle was removed intact. L4 Medial Branch Nerve RFA: The target area for the L4 medial branch is at the junction of the postero-lateral aspect of the superior articular process and the superior, posterior, and medial edge of the transverse process of L5. Under fluoroscopic guidance, a Radiofrequency needle was inserted until contact was made with os over the superior postero-lateral aspect of the pedicular shadow (target area). Sensory and motor testing was conducted to properly adjust the position of the needle. Once satisfactory placement of the needle was achieved, the numbing solution was slowly injected after negative aspiration for blood. 23mL of the nerve block solution was injected without difficulty or complication. After waiting for at least 3 minutes, the ablation was performed. Once completed, the needle was removed intact. L5 Medial Branch Nerve RFA: The target area for the L5 medial branch is at the junction of the postero-lateral aspect of the superior articular process of S1 and the superior, posterior, and medial edge of the sacral ala. Under fluoroscopic guidance, a Radiofrequency needle was inserted until contact was made with os over the superior postero-lateral aspect of the pedicular shadow (target area). Sensory and motor testing was conducted to properly adjust the position of the needle. Once satisfactory placement of the needle was achieved, the numbing solution was slowly injected after negative aspiration for blood.2 mL of the nerve block solution was injected without difficulty or complication. After waiting for at least 3 minutes, the ablation was performed. Once completed, the needle was removed intact.   Radiofrequency lesioning (ablation):  Radiofrequency Generator: NeuroTherm [id number] Sensory Stimulation Parameters: 50 Hz was used to locate & identify the nerve, making sure that  the needle was positioned such that there was no sensory stimulation below 0.3 V or above 0.7 V. Motor Stimulation Parameters: 2 Hz was used to evaluate the motor component. Care was taken not to lesion any nerves that demonstrated motor stimulation of the lower extremities at an output of less than 2.5  times that of the sensory threshold, or a maximum of 2.0 V. Lesioning Technique Parameters: Standard Radiofrequency settings. (Not bipolar or pulsed.) Temperature Settings: 80 degrees C Lesioning time: 60 seconds Intra-operative Compliance: Compliant Materials & Medications: Needle(s) (Electrode/Cannula) Type: Teflon-coated, curved tip, Radiofrequency needle(s) Gauge: 22G Length: 10cm Numbing solution: 6 cc solution made of 5 cc of 0.2% ropivacaine, 1 cc of Decadron 10 mg/cc.  2 cc injected at each level above on the right prior to ablation.  The unused portion of the solution was discarded in the proper designated containers.  Once the entire procedure was completed, the treated area was cleaned, making sure to leave some of the prepping solution back to take advantage of its long term bactericidal properties.  Illustration of the posterior view of the lumbar spine and the posterior neural structures. Laminae of L2 through S1 are labeled. DPRL5, dorsal primary ramus of L5; DPRS1, dorsal primary ramus of S1; DPR3, dorsal primary ramus of L3; FJ, facet (zygapophyseal) joint L3-L4; I, inferior articular process of L4; LB1, lateral branch of dorsal primary ramus of L1; IAB, inferior articular branches from L3 medial branch (supplies L4-L5 facet joint); IBP, intermediate branch plexus; MB3, medial branch of dorsal primary ramus of L3; NR3, third lumbar nerve root; S, superior articular process of L5; SAB, superior articular branches from L4 (supplies L4-5 facet joint also); TP3, transverse process of L3.  Vitals:   10/20/18 0901 10/20/18 0910 10/20/18 0920 10/20/18 0930  BP: 105/64 103/63 99/67 106/61   Pulse:      Resp: 10 12 15 13   Temp:  (!) 97.3 F (36.3 C)  98 F (36.7 C)  TempSrc:      SpO2: 97% 97% 97% 98%  Weight:      Height:        Start Time: 0840 hrs. End Time: 0901 hrs.  Imaging Guidance (Spinal):          Type of Imaging Technique: Fluoroscopy Guidance (Spinal) Indication(s): Assistance in needle guidance and placement for procedures requiring needle placement in or near specific anatomical locations not easily accessible without such assistance. Exposure Time: Please see nurses notes. Contrast: None used. Fluoroscopic Guidance: I was personally present during the use of fluoroscopy. "Tunnel Vision Technique" used to obtain the best possible view of the target area. Parallax error corrected before commencing the procedure. "Direction-depth-direction" technique used to introduce the needle under continuous pulsed fluoroscopy. Once target was reached, antero-posterior, oblique, and lateral fluoroscopic projection used confirm needle placement in all planes. Images permanently stored in EMR. Interpretation: No contrast injected. I personally interpreted the imaging intraoperatively. Adequate needle placement confirmed in multiple planes. Permanent images saved into the patient's record.  Antibiotic Prophylaxis:   Anti-infectives (From admission, onward)   None     Indication(s): None identified  Post-operative Assessment:  Post-procedure Vital Signs:  Pulse/HCG Rate: 6461 Temp: 98 F (36.7 C) Resp: 13 BP: 106/61 SpO2: 98 %  EBL: None  Complications: No immediate post-treatment complications observed by team, or reported by patient.  Note: The patient tolerated the entire procedure well. A repeat set of vitals were taken after the procedure and the patient was kept under observation following institutional policy, for this type of procedure. Post-procedural neurological assessment was performed, showing return to baseline, prior to discharge. The patient was  provided with post-procedure discharge instructions, including a section on how to identify potential problems. Should any problems arise concerning this procedure, the patient was given instructions to immediately contact us, at any time,  without hesitation. In any case, we plan to contact the patient by telephone for a follow-up status report regarding this interventional procedure.  Comments:  No additional relevant information.  Plan of Care  Orders:  Orders Placed This Encounter  Procedures   Radiofrequency,Lumbar    Standing Status:   Future    Standing Expiration Date:   04/21/2020    Scheduling Instructions:     Side(s): Left-sided     Level: L3-4, L4-5,  Facets ( L3, L4, L5,  Medial Branch Nerves)     Sedation: With Sedation     Scheduling Timeframe: 2 weeks from now    Order Specific Question:   Where will this procedure be performed?    Answer:   [facility]   DG C-Arm 1-60 Min-No Report    Intraoperative interpretation by procedural physician at [place].    Standing Status:   Standing    Number of Occurrences:   1    Order Specific Question:   Reason for exam:    Answer:   Assistance in needle guidance and placement for procedures requiring needle placement in or near specific anatomical locations not easily accessible without such assistance.   Patient instructed to restart Xarelto tomorrow so long as he is not having any LE weakness.  Will return for left L3, L4, L5 RFA in 2 weeks.  Patient instructed to stop Xarelto again 3 days prior to left RFA.  Medications ordered for procedure: Meds ordered this encounter  Medications   fentaNYL (SUBLIMAZE) injection 25-50 mcg    Make sure Narcan is available in the pyxis when using this medication. In the event of respiratory depression (RR< 8/min): Titrate NARCAN (naloxone) in increments of 0.1 to 0.2 mg IV at 2-3 minute intervals, until desired degree of reversal.   ropivacaine (PF) 2 mg/mL (0.2%)  (NAROPIN) injection 9 mL   dexamethasone (DECADRON) injection 10 mg   lidocaine (XYLOCAINE) 2 % (with pres) injection 400 mg   lactated ringers infusion 1,000 mL   Medications administered: We administered fentaNYL, ropivacaine (PF) 2 mg/mL (0.2%), dexamethasone, and lidocaine.  See the medical record for exact dosing, route, and time of administration.  Disposition: Discharge home  Discharge Date & Time: 10/20/2018; 0931 hrs.   Follow-up plan:   Return in about 2 weeks (around 11/03/2018) for Contra-lateral RFA.     Future Appointments  Date Time Provider [place]  11/05/2018  8:00 AM Gillis Santa, MD [facility] None   Primary Care Physician: Adin Hector, MD Location: [facility] Note by: Gillis Santa, MD Date: 10/20/2018; Time: 11:04 AM  Disclaimer:  Medicine is not an exact science. The only guarantee in medicine is that nothing is guaranteed. It is important to note that the decision to proceed with this intervention was based on the information collected from the patient. The Data and conclusions were drawn from the patient's questionnaire, the interview, and the physical examination. Because the information was provided in large part by the patient, it cannot be guaranteed that it has not been purposely or unconsciously manipulated. Every effort has been made to obtain as much relevant data as possible for this evaluation. It is important to note that the conclusions that lead to this procedure are derived in large part from the available data. Always take into account that the treatment will also be dependent on availability of resources and existing treatment guidelines, considered by other Pain Management Practitioners as being common knowledge and  practice, at the time of the intervention. For Medico-Legal purposes, it is also important to point out that variation in procedural techniques and pharmacological choices are the acceptable norm.  The indications, contraindications, technique, and results of the above procedure should only be interpreted and judged by a Board-Certified Interventional Pain Specialist with extensive familiarity and expertise in the same exact procedure and technique.

## 2018-10-21 ENCOUNTER — Telehealth: Payer: Self-pay

## 2018-10-21 NOTE — Telephone Encounter (Signed)
States he was sore yesterday but is doing better today. Instructed to call us if he needed anything.

## 2018-11-05 ENCOUNTER — Ambulatory Visit
Payer: No Typology Code available for payment source | Admitting: Student in an Organized Health Care Education/Training Program

## 2018-11-12 ENCOUNTER — Ambulatory Visit
Payer: No Typology Code available for payment source | Admitting: Student in an Organized Health Care Education/Training Program

## 2018-12-05 DIAGNOSIS — Z125 Encounter for screening for malignant neoplasm of prostate: Secondary | ICD-10-CM | POA: Diagnosis not present

## 2018-12-05 DIAGNOSIS — G8929 Other chronic pain: Secondary | ICD-10-CM | POA: Diagnosis not present

## 2018-12-05 DIAGNOSIS — M545 Low back pain: Secondary | ICD-10-CM | POA: Diagnosis not present

## 2018-12-05 DIAGNOSIS — E785 Hyperlipidemia, unspecified: Secondary | ICD-10-CM | POA: Diagnosis not present

## 2018-12-11 DIAGNOSIS — C099 Malignant neoplasm of tonsil, unspecified: Secondary | ICD-10-CM | POA: Diagnosis not present

## 2018-12-11 DIAGNOSIS — H9202 Otalgia, left ear: Secondary | ICD-10-CM | POA: Diagnosis not present

## 2018-12-15 ENCOUNTER — Other Ambulatory Visit: Payer: Self-pay

## 2018-12-15 MED ORDER — SACUBITRIL-VALSARTAN 49-51 MG PO TABS: 1.0000 | ORAL_TABLET | Freq: Two times a day (BID) | ORAL | 11 refills | Status: DC

## 2018-12-16 ENCOUNTER — Telehealth: Payer: Self-pay

## 2018-12-16 DIAGNOSIS — E7849 Other hyperlipidemia: Secondary | ICD-10-CM | POA: Diagnosis not present

## 2018-12-16 DIAGNOSIS — E039 Hypothyroidism, unspecified: Secondary | ICD-10-CM | POA: Diagnosis not present

## 2018-12-16 DIAGNOSIS — C099 Malignant neoplasm of tonsil, unspecified: Secondary | ICD-10-CM | POA: Diagnosis not present

## 2018-12-16 DIAGNOSIS — R1031 Right lower quadrant pain: Secondary | ICD-10-CM | POA: Diagnosis not present

## 2018-12-16 NOTE — Telephone Encounter (Signed)
Virtual Visit Pre-Appointment Phone Call 1. Confirm consent - "In the setting of the current Covid19 crisis, you are scheduled for a (phone or video) visit with your provider on (date) at (time).  Just as we do with many in-office visits, in order for you to participate in this visit, we must obtain consent.  If you'd like, I can send this to your mychart (if signed up) or email for you to review.  Otherwise, I can obtain your verbal consent now.  All virtual visits are billed to your insurance company just like a normal visit would be.  By agreeing to a virtual visit, we'd like you to understand that the technology does not allow for your provider to perform an examination, and thus may limit your provider's ability to fully assess your condition. If your provider identifies any concerns that need to be evaluated in person, we will make arrangements to do so.  Finally, though the technology is pretty good, we cannot assure that it will always work on either your or our end, and in the setting of a video visit, we may have to convert it to a phone-only visit.  In either situation, we cannot ensure that we have a secure connection.  Are you willing to proceed?"  YES  2. Advise patient to be prepared - "Two hours prior to your appointment, go ahead and check your blood pressure, pulse, oxygen saturation, and your weight (if you have the equipment to check those) and write them all down. When your visit starts, your provider will ask you for this information. If you have an Apple Watch or Kardia device, please plan to have heart rate information ready on the day of your appointment. Please have a pen and paper handy nearby the day of the visit as well."  3.  Doximity/Doxy.me as below if video visit (depending on what platform provider is using)  4. Inform patient they will receive a phone call 15 minutes prior to their appointment time (may be from unknown caller ID) so they should be prepared to answer     [name] has been deemed a candidate for a follow-up tele-health visit to limit community exposure during the Covid-19 pandemic. I spoke with the patient via phone to ensure availability of phone/video source, confirm preferred email & phone number, and discuss instructions and expectations.  I reminded [name] to be prepared with any vital sign and/or heart rhythm information that could potentially be obtained via home monitoring, at the time of his visit. I reminded [name] to expect a phone call prior to his visit.  Alba Destine, RMA 12/16/2018 8:36 AM   IF USING DOXIMITY or DOXY.ME - The patient will receive a link just prior to their visit by text.     FULL LENGTH CONSENT FOR TELE-HEALTH VISIT   I hereby voluntarily request, consent and authorize [facility] and its employed or contracted physicians, physician assistants, nurse practitioners or other licensed health care professionals (the Practitioner), to provide me with telemedicine health care services (the "Services") as deemed necessary by the treating Practitioner. I acknowledge and consent to receive the Services by the Practitioner via telemedicine. I understand that the telemedicine visit will involve communicating with the Practitioner through live audiovisual communication technology and the disclosure of certain medical information by electronic transmission. I acknowledge that I have been given the opportunity to request an in-person assessment or other available alternative prior to  the telemedicine visit and am voluntarily participating in the telemedicine visit.  I understand that I have the right to withhold or withdraw my consent to the use of telemedicine in the course of my care at any time, without affecting my right to future care or treatment, and that the Practitioner or I may terminate the telemedicine visit at any time. I understand that I have the  right to inspect all information obtained and/or recorded in the course of the telemedicine visit and may receive copies of available information for a reasonable fee.  I understand that some of the potential risks of receiving the Services via telemedicine include:  Marland Kitchen Delay or interruption in medical evaluation due to technological equipment failure or disruption; . Information transmitted may not be sufficient (e.g. poor resolution of images) to allow for appropriate medical decision making by the Practitioner; and/or  . In rare instances, security protocols could fail, causing a breach of personal health information.  Furthermore, I acknowledge that it is my responsibility to provide information about my medical history, conditions and care that is complete and accurate to the best of my ability. I acknowledge that Practitioner's advice, recommendations, and/or decision may be based on factors not within their control, such as incomplete or inaccurate data provided by me or distortions of diagnostic images or specimens that may result from electronic transmissions. I understand that the practice of medicine is not an exact science and that Practitioner makes no warranties or guarantees regarding treatment outcomes. I acknowledge that I will receive a copy of this consent concurrently upon execution via email to the email address I last provided but may also request a printed copy by calling the office of [facility].    I understand that my insurance will be billed for this visit.   I have read or had this consent read to me. . I understand the contents of this consent, which adequately explains the benefits and risks of the Services being provided via telemedicine.  . I have been provided ample opportunity to ask questions regarding this consent and the Services and have had my questions answered to my satisfaction. . I give my informed consent for the services to be provided through the use of  telemedicine in my medical care  By participating in this telemedicine visit I agree to the above.

## 2018-12-18 ENCOUNTER — Telehealth (INDEPENDENT_AMBULATORY_CARE_PROVIDER_SITE_OTHER): Payer: BLUE CROSS/BLUE SHIELD | Admitting: Internal Medicine

## 2018-12-18 ENCOUNTER — Other Ambulatory Visit: Payer: Self-pay

## 2018-12-18 VITALS — Ht 71.0 in | Wt 258.0 lb

## 2018-12-18 DIAGNOSIS — E669 Obesity, unspecified: Secondary | ICD-10-CM

## 2018-12-18 DIAGNOSIS — I429 Cardiomyopathy, unspecified: Secondary | ICD-10-CM | POA: Diagnosis not present

## 2018-12-18 DIAGNOSIS — E038 Other specified hypothyroidism: Secondary | ICD-10-CM

## 2018-12-18 DIAGNOSIS — I480 Paroxysmal atrial fibrillation: Secondary | ICD-10-CM | POA: Diagnosis not present

## 2018-12-18 NOTE — Patient Instructions (Signed)
Medication Instructions:  - Your physician recommends that you continue on your current medications as directed. Please refer to the Current Medication list given to you today.  If you need a refill on your cardiac medications before your next appointment, please call your pharmacy.   Lab work: - none ordered  If you have labs (blood work) drawn today and your tests are completely normal, you will receive your results only by: Marland Kitchen MyChart Message (if you have MyChart) OR . A paper copy in the mail If you have any lab test that is abnormal or we need to change your treatment, we will call you to review the results.  Testing/Procedures: - none ordered  Follow-Up: At [facility], you and your health needs are our priority.  As part of our continuing mission to provide you with exceptional heart care, we have created designated Provider Care Teams.  These Care Teams include your primary Cardiologist (physician) and Advanced Practice Providers (APPs -  Physician Assistants and Nurse Practitioners) who all work together to provide you with the care you need, when you need it.  . You will need a follow up appointment in 6 months (November) with Dr. Caryl Comes . Please call our office 2 months in advance to schedule this appointment. (call in early September to schedule).    Any Other Special Instructions Will Be Listed Below (If Applicable). - N/A

## 2018-12-18 NOTE — Progress Notes (Signed)
Electrophysiology TeleHealth Note   Due to national recommendations of social distancing due to COVID 19, an audio/video telehealth visit is felt to be most appropriate for this patient at this time.  See MyChart message from today for the patient's consent to telehealth for [facility].   Date:  12/18/2018   ID:  [name], DOB 02/22/1966, MRN [id number]  Location: patient's home  Provider location: [address], [place]  Evaluation Performed: Follow-up visit  PCP:  Adin Hector, MD  Cardiologist:     Electrophysiologist:  SK   Chief Complaint: PVCs and atrial fibrillation  History of Present Illness:    Trent Gabler is a 53 y.o. male who presents via audio/video conferencing for a telehealth visit today.  Since last being seen in our clinic for PVCs and atrial fibrillation in the context of a mild cardiomyopathy the patient reports overall improvement; almost no palpitations.  Intermittent left-sided neck pain but unassociated with his exercise routine which is quite vigorous.  No shortness of breath or edema.  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  Echocardiogram 3/19 EF 35-40%  The patient denies symptoms of fevers, chills, cough, or new SOB worrisome for COVID 19.    Past Medical History:  Diagnosis Date  . Anxiety   . Arthritis    gout  . Atrial fibrillation ([place]) 1989  . Bicuspid aortic valve    Last echo looks trileaflet  . Cardiomyopathy 08/2009   Mildly decreased EF  . Depression   . GERD (gastroesophageal reflux disease)   . Gout   . 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 Hyperlipidemia   . Hypertension   . Seizures ([place]) 1998   with heat stroke in the [employer] -none since    Past Surgical History:  Procedure Laterality Date  . CARDIAC  CATHETERIZATION  03/2010   No sig coronary disease, EF ~40%  . CARDIOVERSION  1989  . TOTAL KNEE ARTHROPLASTY Left     Current Outpatient Medications  Medication Sig Dispense Refill  . allopurinol (ZYLOPRIM) 100 MG tablet Take 300 mg by mouth daily.    Marland Kitchen atorvastatin (LIPITOR) 20 MG tablet take 1 tablet by mouth once daily 90 tablet 2  . carvedilol (COREG) 25 MG tablet 12.5 mg 2 (two) times daily with a meal.     . cholecalciferol (VITAMIN D3) 25 MCG (1000 UT) tablet Take 1,000 Units by mouth 2 (two) times a day.    Mariane Baumgarten Sodium (STOOL SOFTENER) 100 MG capsule Take 100 mg by mouth 2 (two) times daily.    Marland Kitchen FLUoxetine (PROZAC) 20 MG capsule Take 1 capsule (20 mg total) by mouth daily. 90 capsule 0  . glucosamine-chondroitin 500-400 MG tablet Take 1 tablet by mouth 3 (three) times daily.    . Ibuprofen 200 MG CAPS Take 800 mg by mouth 2 (two) times daily as needed.     Marland Kitchen levothyroxine (SYNTHROID, LEVOTHROID) 50 MCG tablet Take 50 mcg by mouth daily before breakfast.    . magnesium oxide (MAG-OX) 400 MG tablet Take 400 mg by mouth daily.    . Melatonin 5 MG CAPS Take 20 mg by mouth at bedtime. Takes 2 capsules po at bedtime for sleep.     . Multiple Vitamin (MULTIVITAMIN) tablet Take  1 tablet by mouth daily.    . rivaroxaban (XARELTO) 20 MG TABS tablet Take 1 tablet (20 mg total) by mouth daily. 30 tablet 5  . sacubitril-valsartan (ENTRESTO) 49-51 MG Take 1 tablet by mouth 2 (two) times daily. 60 tablet 11  . vitamin C (ASCORBIC ACID) 500 MG tablet Take 500 mg by mouth daily.     Marland Kitchen HYDROcodone-acetaminophen (NORCO/VICODIN) 5-325 MG tablet take 1 to 2 tablets by mouth every 4 hours if needed  0   No current facility-administered medications for this visit.     Allergies:   Patient has no known allergies.   Social History:  The patient  reports that he has never smoked. He has never used smokeless tobacco. He reports current alcohol use of about 2.0 - 3.0 standard drinks of alcohol per  week. He reports that he does not use drugs.   Family History:  The patient's   family history includes Arthritis in his mother; COPD in his mother; Coronary artery disease in his paternal grandfather; Crohn's disease in his brother; Depression in his mother; Diabetes in his mother; Heart attack in his father; Lung cancer in his maternal grandfather, paternal uncle, and paternal uncle; Pancreatic cancer in his paternal uncle; Thyroid cancer in his paternal aunt.   ROS:  Please see the history of present illness.   All other systems are personally reviewed and negative.    Exam:    Vital Signs:  Ht 5\' 11"  (1.803 m)   Wt 258 lb (117 kg)   BMI 35.98 kg/m     Well appearing, alert and conversant, regular work of breathing,  good skin color Eyes- anicteric, neuro- grossly intact, skin- no apparent rash or lesions or cyanosis, mouth- oral mucosa is pink   Labs/Other Tests and Data Reviewed:    Recent Labs: No results found for requested labs within last 8760 hours.   Wt Readings from Last 3 Encounters:  12/18/18 258 lb (117 kg)  10/20/18 250 lb (113.4 kg)  09/11/18 250 lb (113.4 kg)     Other studies personally reviewed: Additional studies/ records that were reviewed today include: TSH  Review of the above records today demonstrates:   Prior radiographs:      ASSESSMENT & PLAN:    PVCs  Atrial Fibrillation   Cardiomyopathy--nonischemic  Hypothyroidism  Squamous cell cancer of the head and neck with recent chemotherapy  Scant palpitations.  Continue current medications  TSH is elevated at 10.66 he is to meet with Dr. Caryn Section next week.  Hypothyroidism as a consequence of radiation therapy for neck cancer.  I tried to explain to the patient the feedback mechanisms of TSH and told him I suspect his Synthroid dose will be increased.  Reviewed the importance of ongoing exercise given his obesity; he has put on 40 pounds since the completion of his radiation therapy  COVID 19 screen The patient denies symptoms of COVID 19 at this time.  The importance of social distancing was discussed today.  Follow-up:  6 m    Current medicines are reviewed at length with the patient today.   The patient does not have concerns regarding his medicines.  The following changes were made today:  none  Labs/ tests ordered today include:  No orders of the defined types were placed in this encounter.   Future tests ( post COVID ) 6 months at the time of follow-up visit    Patient Risk:  after full review of this patients clinical status,  I feel that they are at moderate  risk at this time.  Today, I have spent 14 minutes with the patient with telehealth technology discussing the above.  Signed, Virl Axe, MD  12/18/2018 12:51 PM     [facility] [address] [place] [address] [phone] (office) [phone] (fax)

## 2018-12-24 DIAGNOSIS — I480 Paroxysmal atrial fibrillation: Secondary | ICD-10-CM | POA: Diagnosis not present

## 2018-12-24 DIAGNOSIS — E039 Hypothyroidism, unspecified: Secondary | ICD-10-CM | POA: Diagnosis not present

## 2018-12-24 DIAGNOSIS — I10 Essential (primary) hypertension: Secondary | ICD-10-CM | POA: Diagnosis not present

## 2018-12-24 DIAGNOSIS — I428 Other cardiomyopathies: Secondary | ICD-10-CM | POA: Diagnosis not present

## 2018-12-25 DIAGNOSIS — G5603 Carpal tunnel syndrome, bilateral upper limbs: Secondary | ICD-10-CM | POA: Diagnosis not present

## 2018-12-25 DIAGNOSIS — Z8521 Personal history of malignant neoplasm of larynx: Secondary | ICD-10-CM | POA: Diagnosis not present

## 2018-12-31 DIAGNOSIS — B078 Other viral warts: Secondary | ICD-10-CM | POA: Diagnosis not present

## 2018-12-31 DIAGNOSIS — L570 Actinic keratosis: Secondary | ICD-10-CM | POA: Diagnosis not present

## 2018-12-31 DIAGNOSIS — L739 Follicular disorder, unspecified: Secondary | ICD-10-CM | POA: Diagnosis not present

## 2018-12-31 DIAGNOSIS — L820 Inflamed seborrheic keratosis: Secondary | ICD-10-CM | POA: Diagnosis not present

## 2019-01-07 DIAGNOSIS — G5603 Carpal tunnel syndrome, bilateral upper limbs: Secondary | ICD-10-CM | POA: Diagnosis not present

## 2019-01-07 DIAGNOSIS — M7021 Olecranon bursitis, right elbow: Secondary | ICD-10-CM | POA: Diagnosis not present

## 2019-01-07 DIAGNOSIS — M25521 Pain in right elbow: Secondary | ICD-10-CM | POA: Diagnosis not present

## 2019-01-07 DIAGNOSIS — M25522 Pain in left elbow: Secondary | ICD-10-CM | POA: Diagnosis not present

## 2019-01-07 DIAGNOSIS — M7712 Lateral epicondylitis, left elbow: Secondary | ICD-10-CM | POA: Diagnosis not present

## 2019-01-15 DIAGNOSIS — K148 Other diseases of tongue: Secondary | ICD-10-CM | POA: Diagnosis not present

## 2019-01-15 DIAGNOSIS — C099 Malignant neoplasm of tonsil, unspecified: Secondary | ICD-10-CM | POA: Diagnosis not present

## 2019-01-22 DIAGNOSIS — G5601 Carpal tunnel syndrome, right upper limb: Secondary | ICD-10-CM | POA: Diagnosis not present

## 2019-01-22 DIAGNOSIS — M25531 Pain in right wrist: Secondary | ICD-10-CM | POA: Diagnosis not present

## 2019-01-23 DIAGNOSIS — M25532 Pain in left wrist: Secondary | ICD-10-CM | POA: Diagnosis not present

## 2019-01-23 DIAGNOSIS — E7849 Other hyperlipidemia: Secondary | ICD-10-CM | POA: Diagnosis not present

## 2019-01-23 DIAGNOSIS — I10 Essential (primary) hypertension: Secondary | ICD-10-CM | POA: Diagnosis not present

## 2019-01-23 DIAGNOSIS — E039 Hypothyroidism, unspecified: Secondary | ICD-10-CM | POA: Diagnosis not present

## 2019-01-23 DIAGNOSIS — C779 Secondary and unspecified malignant neoplasm of lymph node, unspecified: Secondary | ICD-10-CM | POA: Diagnosis not present

## 2019-01-23 DIAGNOSIS — M109 Gout, unspecified: Secondary | ICD-10-CM | POA: Diagnosis not present

## 2019-01-30 DIAGNOSIS — C099 Malignant neoplasm of tonsil, unspecified: Secondary | ICD-10-CM | POA: Diagnosis not present

## 2019-01-30 DIAGNOSIS — I428 Other cardiomyopathies: Secondary | ICD-10-CM | POA: Diagnosis not present

## 2019-01-30 DIAGNOSIS — I10 Essential (primary) hypertension: Secondary | ICD-10-CM | POA: Diagnosis not present

## 2019-01-30 DIAGNOSIS — I480 Paroxysmal atrial fibrillation: Secondary | ICD-10-CM | POA: Diagnosis not present

## 2019-02-02 ENCOUNTER — Encounter: Payer: Self-pay | Admitting: Student in an Organized Health Care Education/Training Program

## 2019-02-03 ENCOUNTER — Ambulatory Visit
Payer: No Typology Code available for payment source | Attending: Student in an Organized Health Care Education/Training Program | Admitting: Student in an Organized Health Care Education/Training Program

## 2019-02-03 ENCOUNTER — Encounter: Payer: Self-pay | Admitting: Student in an Organized Health Care Education/Training Program

## 2019-02-03 ENCOUNTER — Other Ambulatory Visit: Payer: Self-pay

## 2019-02-03 DIAGNOSIS — M47816 Spondylosis without myelopathy or radiculopathy, lumbar region: Secondary | ICD-10-CM | POA: Diagnosis not present

## 2019-02-03 DIAGNOSIS — G894 Chronic pain syndrome: Secondary | ICD-10-CM

## 2019-02-03 DIAGNOSIS — M5136 Other intervertebral disc degeneration, lumbar region: Secondary | ICD-10-CM

## 2019-02-03 NOTE — Progress Notes (Signed)
Pain Management Virtual Encounter Note - Virtual Visit via Telephone Telehealth (real-time audio visits between healthcare provider and patient).   Patient's Phone No. & Preferred Pharmacy:  [phone] (home); [phone] (mobile); (Preferred) [phone] [email or web address]  [id number] Lorina Rabon, [facility] [place] [address] [place] [address] Phone: [phone] Fax: [phone]  [id number] - [place], [place] - [place] [address] [place] [address] [place] [address] Phone: [phone] Fax: [phone]    Pre-screening note:  Our staff contacted Mr. Tucholski and offered him an "in person", "face-to-face" appointment versus a telephone encounter. He indicated preferring the telephone encounter, at this time.   Reason for Virtual Visit: COVID-19*  Social distancing based on CDC and AMA recommendations.   I contacted [name] on 02/03/2019 via telephone.      I clearly identified myself as Gillis Santa, MD. I verified that I was speaking with the correct person using two identifiers (Name: Davit Vassar, and date of birth: 15-Aug-1965).  Advanced Informed Consent I sought verbal advanced consent from [name] for virtual visit interactions. I informed Mr. Harari of possible security and privacy concerns, risks, and limitations associated with providing "not-in-person" medical evaluation and management services. I also informed Mr. Mastel of the availability of "in-person" appointments. Finally, I informed him that there would be a charge for the virtual visit and that he could be  personally, fully or partially, financially responsible for it. Mr. Kurowski expressed understanding and agreed to proceed.   Historic Elements   Mr. Jaysion Ramseyer is a 53 y.o. year old, male patient evaluated today after his last encounter by our practice on 10/21/2018. Mr.  Grobe  has a past medical history of Anxiety, Arthritis, Atrial fibrillation ([place]) (1989), Bicuspid aortic valve, Cardiomyopathy (08/2009), Depression, GERD (gastroesophageal reflux disease), Gout, Heat stroke (1998), Hyperlipidemia, Hypertension, and Seizures ([place]) (1998). He also  has a past surgical history that includes Cardioversion (1989); Cardiac catheterization (03/2010); and Total knee arthroplasty (Left). Mr. Babington has a current medication list which includes the following prescription(s): allopurinol, atorvastatin, carvedilol, cholecalciferol, docusate sodium, fluoxetine, glucosamine-chondroitin, hydrocodone-acetaminophen, ibuprofen, levothyroxine, magnesium oxide, melatonin, multivitamin, rivaroxaban, sacubitril-valsartan, and vitamin c. He  reports that he has never smoked. He has never used smokeless tobacco. He reports current alcohol use of about 2.0 - 3.0 standard drinks of alcohol per week. He reports that he does not use drugs. Mr. Nadeem has No Known Allergies.   HPI  Today, he is being contacted for worsening of previously known (established) problem  Patient's last visit with me was on 10/20/2018.  Patient is we performed a right L3, L4, L5 radiofrequency ablation at that time.  He states that he is obtaining pain relief on his right side.  He is scheduled for a left-sided  L3, L4, L5 lumbar radiofrequency ablation in the upcoming weeks as this was postponed given COVID-19 restrictions for elective procedures.  Patient is somewhat concerned about his prognosis.  He feels that his condition is deteriorating and that while procedures do help he has noticed that his baseline functional status and pain have worsened over the last 5 years.  He is requesting a letter be sent to the [place] describing his chronic pain condition its associated severity and potential chronicity  Assessment  The primary encounter diagnosis was Lumbar spondylosis. Diagnoses of Lumbar facet arthropathy, Chronic pain  syndrome, and Lumbar degenerative disc disease were also pertinent to this visit.  Plan of Care  I am having Carrie Mew maintain his multivitamin, atorvastatin, vitamin C, Ibuprofen, HYDROcodone-acetaminophen, Melatonin, rivaroxaban, FLUoxetine, allopurinol, levothyroxine, sacubitril-valsartan, carvedilol, cholecalciferol, Docusate Sodium, magnesium oxide, and glucosamine-chondroitin.  Follow-up for contralateral, left-sided RFA at L3, L4, L5 at the end of July.  I will also write letter for the patient that he can submit to the [place] per his request.  Recent Visits No visits were found meeting these conditions.  Showing recent visits within past 90 days and meeting all other requirements   Today's Visits Date Type Provider Dept  02/03/19 Office Visit Gillis Santa, MD [facility]  Showing today's visits and meeting all other requirements   Future Appointments Date Type Provider Dept  02/23/19 Appointment Gillis Santa, MD [facility]  Showing future appointments within next 90 days and meeting all other requirements   I discussed the assessment and treatment plan with the patient. The patient was provided an opportunity to ask questions and all were answered. The patient agreed with the plan and demonstrated an understanding of the instructions.  Patient advised to call back or seek an in-person evaluation if the symptoms or condition worsens.  Total duration of non-face-to-face encounter: 15 minutes.  Note by: Gillis Santa, MD Date: 02/03/2019; Time: 3:30 PM  Note: This dictation was prepared with Dragon dictation. Any transcriptional errors that may result from this process are unintentional.  Disclaimer:  * Given the special circumstances of the COVID-19 pandemic, the federal government has announced that the Office for Civil Rights (OCR) will exercise its enforcement discretion and will not impose penalties on physicians using telehealth in the event of  noncompliance with regulatory requirements under the [place] (HIPAA) in connection with the good faith provision of telehealth during the GYFVC-94 national public health emergency. ([place])

## 2019-02-05 DIAGNOSIS — C099 Malignant neoplasm of tonsil, unspecified: Secondary | ICD-10-CM | POA: Diagnosis not present

## 2019-02-05 DIAGNOSIS — C770 Secondary and unspecified malignant neoplasm of lymph nodes of head, face and neck: Secondary | ICD-10-CM | POA: Diagnosis not present

## 2019-02-05 DIAGNOSIS — E039 Hypothyroidism, unspecified: Secondary | ICD-10-CM | POA: Diagnosis not present

## 2019-02-05 DIAGNOSIS — J383 Other diseases of vocal cords: Secondary | ICD-10-CM | POA: Diagnosis not present

## 2019-02-11 ENCOUNTER — Ambulatory Visit
Payer: No Typology Code available for payment source | Admitting: Student in an Organized Health Care Education/Training Program

## 2019-02-18 ENCOUNTER — Other Ambulatory Visit
Admission: RE | Admit: 2019-02-18 | Discharge: 2019-02-18 | Disposition: A | Discharge status: Home / Self Care | Payer: BC Managed Care – PPO | Source: Ambulatory Visit | Attending: Student in an Organized Health Care Education/Training Program | Admitting: Student in an Organized Health Care Education/Training Program

## 2019-02-18 ENCOUNTER — Other Ambulatory Visit: Payer: Self-pay

## 2019-02-18 DIAGNOSIS — Z1159 Encounter for screening for other viral diseases: Secondary | ICD-10-CM | POA: Insufficient documentation

## 2019-02-18 LAB — SARS CORONAVIRUS 2 (TAT 6-24 HRS): SARS Coronavirus 2: NEGATIVE

## 2019-02-23 ENCOUNTER — Ambulatory Visit (HOSPITAL_BASED_OUTPATIENT_CLINIC_OR_DEPARTMENT_OTHER)
Payer: No Typology Code available for payment source | Admitting: Student in an Organized Health Care Education/Training Program

## 2019-02-23 ENCOUNTER — Ambulatory Visit
Admission: RE | Admit: 2019-02-23 | Discharge: 2019-02-23 | Disposition: A | Discharge status: Home / Self Care | Payer: No Typology Code available for payment source | Source: Ambulatory Visit | Attending: Student in an Organized Health Care Education/Training Program | Admitting: Student in an Organized Health Care Education/Training Program

## 2019-02-23 ENCOUNTER — Other Ambulatory Visit: Payer: Self-pay

## 2019-02-23 ENCOUNTER — Encounter: Payer: Self-pay | Admitting: Student in an Organized Health Care Education/Training Program

## 2019-02-23 VITALS — BP 105/63 | HR 75 | Temp 98.2°F | Resp 13 | Ht 71.0 in | Wt 236.0 lb

## 2019-02-23 DIAGNOSIS — M47816 Spondylosis without myelopathy or radiculopathy, lumbar region: Secondary | ICD-10-CM

## 2019-02-23 MED ORDER — FENTANYL CITRATE (PF) 100 MCG/2ML IJ SOLN
25.0000 ug | INTRAMUSCULAR | Status: DC | PRN
  Administered 2019-02-23: 11:00:00 100 ug via INTRAVENOUS

## 2019-02-23 MED ORDER — LIDOCAINE HCL 2 % IJ SOLN
20.0000 mL | Freq: Once | INTRAMUSCULAR | Status: AC
  Administered 2019-02-23: 400 mg

## 2019-02-23 MED ORDER — ROPIVACAINE HCL 2 MG/ML IJ SOLN
2.0000 mL | Freq: Once | INTRAMUSCULAR | Status: AC
  Administered 2019-02-23: 10 mL via EPIDURAL

## 2019-02-23 MED ORDER — DEXAMETHASONE SODIUM PHOSPHATE 10 MG/ML IJ SOLN
10.0000 mg | Freq: Once | INTRAMUSCULAR | Status: AC
  Administered 2019-02-23: 10 mg

## 2019-02-23 MED ORDER — FENTANYL CITRATE (PF) 100 MCG/2ML IJ SOLN
INTRAMUSCULAR | Status: AC
  Filled 2019-02-23: qty 2

## 2019-02-23 MED ORDER — ROPIVACAINE HCL 2 MG/ML IJ SOLN
INTRAMUSCULAR | Status: AC
  Filled 2019-02-23: qty 10

## 2019-02-23 MED ORDER — DEXAMETHASONE SODIUM PHOSPHATE 10 MG/ML IJ SOLN
INTRAMUSCULAR | Status: AC
  Filled 2019-02-23: qty 1

## 2019-02-23 MED ORDER — LIDOCAINE HCL 2 % IJ SOLN
INTRAMUSCULAR | Status: AC
  Filled 2019-02-23: qty 20

## 2019-02-23 NOTE — Progress Notes (Signed)
Patient's Name: [name]  MRN: [id number]  Referring Provider: Adin Hector, MD  DOB: 1965/11/30  PCP: Adin Hector, MD  DOS: 02/23/2019  Note by: Gillis Santa, MD  Service setting: Ambulatory outpatient  Specialty: Interventional Pain Management  Patient type: Established  Location: [facility] (AMB) Pain Management Facility  Visit type: Interventional Procedure   Primary Reason for Visit: Interventional Pain Management Treatment. CC: Back Pain (lower)  Procedure:          Anesthesia, Analgesia, Anxiolysis:  Type: Thermal Lumbar Facet, Medial Branch Radiofrequency Ablation/Neurotomy           Primary Purpose: Therapeutic Region: Posterolateral Lumbosacral Spine Level:  L3, L4, L5, Medial Branch Level(s). These levels will denervate the L3-4, L4-5,lumbar facet joints. Laterality: Left  Type: Moderate (Conscious) Sedation combined with Local Anesthesia Indication(s): Analgesia and Anxiety Route: Intravenous (IV) IV Access: Secured Sedation: Meaningful verbal contact was maintained at all times during the procedure  Local Anesthetic: Lidocaine 1-2%  Position: Prone   Indications: 1. Lumbar spondylosis   2. Lumbar facet arthropathy    Mr. Maclin has been dealing with the above chronic pain for longer than three months and has either failed to respond, was unable to tolerate, or simply did not get enough benefit from other more conservative therapies including, but not limited to: 1. Over-the-counter medications 2. Anti-inflammatory medications 3. Muscle relaxants 4. Membrane stabilizers 5. Opioids 6. Physical therapy and/or chiropractic manipulation 7. Modalities (Heat, ice, etc.) 8. Invasive techniques such as nerve blocks. Mr. Kron has attained more than 50% relief of the pain from a series of diagnostic injections conducted in separate occasions.  Pain Score: Pre-procedure: 5 /10 Post-procedure: 0-No pain/10  Stopped Xarelto 3 days ago, last dose Thurs  evening.  Pre-op Assessment:  Mr. Schnabel is a 53 y.o. (year old), male patient, seen today for interventional treatment. He  has a past surgical history that includes Cardioversion (1989); Cardiac catheterization (03/2010); and Total knee arthroplasty (Left). Mr. Derasmo has a current medication list which includes the following prescription(s): allopurinol, atorvastatin, carvedilol, cholecalciferol, docusate sodium, fluoxetine, glucosamine-chondroitin, hydrocodone-acetaminophen, ibuprofen, levothyroxine, magnesium oxide, melatonin, multivitamin, rivaroxaban, sacubitril-valsartan, and vitamin c, and the following Facility-Administered Medications: fentanyl. His primarily concern today is the Back Pain (lower)  Initial Vital Signs:  Pulse/HCG Rate: 75ECG Heart Rate: 67 Temp: 98.2 F (36.8 C) Resp: 16 BP: 112/68 SpO2: 99 %  BMI: Estimated body mass index is 32.92 kg/m as calculated from the following:   Height as of this encounter: 5\' 11"  (1.803 m).   Weight as of this encounter: 236 lb (107 kg).  Risk Assessment: Allergies: Reviewed. He has No Known Allergies.  Allergy Precautions: None required Coagulopathies: Reviewed. None identified.  Blood-thinner therapy: None at this time Active Infection(s): Reviewed. None identified. Mr. Harbor is afebrile  Site Confirmation: Mr. Romack was asked to confirm the procedure and laterality before marking the site Procedure checklist: Completed Consent: Before the procedure and under the influence of no sedative(s), amnesic(s), or anxiolytics, the patient was informed of the treatment options, risks and possible complications. To fulfill our ethical and legal obligations, as recommended by the American Medical Association's Code of Ethics, I have informed the patient of my clinical impression; the nature and purpose of the treatment or procedure; the risks, benefits, and possible complications of the intervention; the alternatives, including doing  nothing; the risk(s) and benefit(s) of the alternative treatment(s) or procedure(s); and the risk(s) and benefit(s) of doing nothing. The patient was provided  information about the general risks and possible complications associated with the procedure. These may include, but are not limited to: failure to achieve desired goals, infection, bleeding, organ or nerve damage, allergic reactions, paralysis, and death. In addition, the patient was informed of those risks and complications associated to Spine-related procedures, such as failure to decrease pain; infection (i.e.: Meningitis, epidural or intraspinal abscess); bleeding (i.e.: epidural hematoma, subarachnoid hemorrhage, or any other type of intraspinal or peri-dural bleeding); organ or nerve damage (i.e.: Any type of peripheral nerve, nerve root, or spinal cord injury) with subsequent damage to sensory, motor, and/or autonomic systems, resulting in permanent pain, numbness, and/or weakness of one or several areas of the body; allergic reactions; (i.e.: anaphylactic reaction); and/or death. Furthermore, the patient was informed of those risks and complications associated with the medications. These include, but are not limited to: allergic reactions (i.e.: anaphylactic or anaphylactoid reaction(s)); adrenal axis suppression; blood sugar elevation that in diabetics may result in ketoacidosis or comma; water retention that in patients with history of congestive heart failure may result in shortness of breath, pulmonary edema, and decompensation with resultant heart failure; weight gain; swelling or edema; medication-induced neural toxicity; particulate matter embolism and blood vessel occlusion with resultant organ, and/or nervous system infarction; and/or aseptic necrosis of one or more joints. Finally, the patient was informed that Medicine is not an exact science; therefore, there is also the possibility of unforeseen or unpredictable risks and/or possible  complications that may result in a catastrophic outcome. The patient indicated having understood very clearly. We have given the patient no guarantees and we have made no promises. Enough time was given to the patient to ask questions, all of which were answered to the patient's satisfaction. Mr. Hamad has indicated that he wanted to continue with the procedure. Attestation: I, the ordering provider, attest that I have discussed with the patient the benefits, risks, side-effects, alternatives, likelihood of achieving goals, and potential problems during recovery for the procedure that I have provided informed consent. Date   Time: 02/23/2019  9:10 AM  Pre-Procedure Preparation:  Monitoring: As per clinic protocol. Respiration, ETCO2, SpO2, BP, heart rate and rhythm monitor placed and checked for adequate function Safety Precautions: Patient was assessed for positional comfort and pressure points before starting the procedure. Time-out: I initiated and conducted the "Time-out" before starting the procedure, as per protocol. The patient was asked to participate by confirming the accuracy of the "Time Out" information. Verification of the correct person, site, and procedure were performed and confirmed by me, the nursing staff, and the patient. "Time-out" conducted as per Joint Commission's Universal Protocol (UP.01.01.01). Time: 1022  Description of Procedure:          Laterality: Left Levels: L3, L4, L5,Medial Branch Level(s), at the L3-4, L4-5,  lumbar facet joints. Area Prepped: Lumbosacral Prepping solution: ChloraPrep (2% chlorhexidine gluconate and 70% isopropyl alcohol) Safety Precautions: Aspiration looking for blood return was conducted prior to all injections. At no point did we inject any substances, as a needle was being advanced. Before injecting, the patient was told to immediately notify me if he was experiencing any new onset of "ringing in the ears, or metallic taste in the mouth". No  attempts were made at seeking any paresthesias. Safe injection practices and needle disposal techniques used. Medications properly checked for expiration dates. SDV (single dose vial) medications used. After the completion of the procedure, all disposable equipment used was discarded in the proper designated medical waste containers. Local Anesthesia: Protocol  guidelines were followed. The patient was positioned over the fluoroscopy table. The area was prepped in the usual manner. The time-out was completed. The target area was identified using fluoroscopy. A 12-in long, straight, sterile hemostat was used with fluoroscopic guidance to locate the targets for each level blocked. Once located, the skin was marked with an approved surgical skin marker. Once all sites were marked, the skin (epidermis, dermis, and hypodermis), as well as deeper tissues (fat, connective tissue and muscle) were infiltrated with a small amount of a short-acting local anesthetic, loaded on a 10cc syringe with a 25G, 1.5-in  Needle. An appropriate amount of time was allowed for local anesthetics to take effect before proceeding to the next step. Local Anesthetic: Lidocaine 2.0% The unused portion of the local anesthetic was discarded in the proper designated containers. Technical explanation of process:  Radiofrequency Ablation (RFA)  L3 Medial Branch Nerve RFA: The target area for the L3 medial branch is at the junction of the postero-lateral aspect of the superior articular process and the superior, posterior, and medial edge of the transverse process of L4. Under fluoroscopic guidance, a Radiofrequency needle was inserted until contact was made with os over the superior postero-lateral aspect of the pedicular shadow (target area). Sensory and motor testing was conducted to properly adjust the position of the needle. Once satisfactory placement of the needle was achieved, the numbing solution was slowly injected after negative  aspiration for blood. 52mL of the nerve block solution was injected without difficulty or complication. After waiting for at least 3 minutes, the ablation was performed. Once completed, the needle was removed intact. L4 Medial Branch Nerve RFA: The target area for the L4 medial branch is at the junction of the postero-lateral aspect of the superior articular process and the superior, posterior, and medial edge of the transverse process of L5. Under fluoroscopic guidance, a Radiofrequency needle was inserted until contact was made with os over the superior postero-lateral aspect of the pedicular shadow (target area). Sensory and motor testing was conducted to properly adjust the position of the needle. Once satisfactory placement of the needle was achieved, the numbing solution was slowly injected after negative aspiration for blood. 74mL of the nerve block solution was injected without difficulty or complication. After waiting for at least 3 minutes, the ablation was performed. Once completed, the needle was removed intact. L5 Medial Branch Nerve RFA: The target area for the L5 medial branch is at the junction of the postero-lateral aspect of the superior articular process of S1 and the superior, posterior, and medial edge of the sacral ala. Under fluoroscopic guidance, a Radiofrequency needle was inserted until contact was made with os over the superior postero-lateral aspect of the pedicular shadow (target area). Sensory and motor testing was conducted to properly adjust the position of the needle. Once satisfactory placement of the needle was achieved, the numbing solution was slowly injected after negative aspiration for blood.2 mL of the nerve block solution was injected without difficulty or complication. After waiting for at least 3 minutes, the ablation was performed. Once completed, the needle was removed intact.   Radiofrequency lesioning (ablation):  Radiofrequency Generator: NeuroTherm  [id number] Sensory Stimulation Parameters: 50 Hz was used to locate & identify the nerve, making sure that the needle was positioned such that there was no sensory stimulation below 0.3 V or above 0.7 V. Motor Stimulation Parameters: 2 Hz was used to evaluate the motor component. Care was taken not to lesion any nerves that demonstrated motor  stimulation of the lower extremities at an output of less than 2.5 times that of the sensory threshold, or a maximum of 2.0 V. Lesioning Technique Parameters: Standard Radiofrequency settings. (Not bipolar or pulsed.) Temperature Settings: 80 degrees C Lesioning time: 60 seconds Intra-operative Compliance: Compliant Materials & Medications: Needle(s) (Electrode/Cannula) Type: Teflon-coated, curved tip, Radiofrequency needle(s) Gauge: 22G Length: 10cm Numbing solution: 6 cc solution made of 5 cc of 0.2% ropivacaine, 1 cc of Decadron 10 mg/cc.  2 cc injected at each level above on the LEFT prior to ablation.  The unused portion of the solution was discarded in the proper designated containers.  Once the entire procedure was completed, the treated area was cleaned, making sure to leave some of the prepping solution back to take advantage of its long term bactericidal properties.  Illustration of the posterior view of the lumbar spine and the posterior neural structures. Laminae of L2 through S1 are labeled. DPRL5, dorsal primary ramus of L5; DPRS1, dorsal primary ramus of S1; DPR3, dorsal primary ramus of L3; FJ, facet (zygapophyseal) joint L3-L4; I, inferior articular process of L4; LB1, lateral branch of dorsal primary ramus of L1; IAB, inferior articular branches from L3 medial branch (supplies L4-L5 facet joint); IBP, intermediate branch plexus; MB3, medial branch of dorsal primary ramus of L3; NR3, third lumbar nerve root; S, superior articular process of L5; SAB, superior articular branches from L4 (supplies L4-5 facet joint also); TP3, transverse process of  L3.  Vitals:   02/23/19 1044 02/23/19 1055 02/23/19 1106 02/23/19 1114  BP: 125/79 114/71 103/68 105/63  Pulse:      Resp: 16 17 12 13   Temp:      SpO2: 98% 99% 100% 100%  Weight:      Height:        Start Time: 1022 hrs. End Time: 1040 hrs.  Imaging Guidance (Spinal):          Type of Imaging Technique: Fluoroscopy Guidance (Spinal) Indication(s): Assistance in needle guidance and placement for procedures requiring needle placement in or near specific anatomical locations not easily accessible without such assistance. Exposure Time: Please see nurses notes. Contrast: None used. Fluoroscopic Guidance: I was personally present during the use of fluoroscopy. "Tunnel Vision Technique" used to obtain the best possible view of the target area. Parallax error corrected before commencing the procedure. "Direction-depth-direction" technique used to introduce the needle under continuous pulsed fluoroscopy. Once target was reached, antero-posterior, oblique, and lateral fluoroscopic projection used confirm needle placement in all planes. Images permanently stored in EMR. Interpretation: No contrast injected. I personally interpreted the imaging intraoperatively. Adequate needle placement confirmed in multiple planes. Permanent images saved into the patient's record.  Antibiotic Prophylaxis:   Anti-infectives (From admission, onward)   None     Indication(s): None identified  Post-operative Assessment:  Post-procedure Vital Signs:  Pulse/HCG Rate: 7563 Temp: 98.2 F (36.8 C) Resp: 13 BP: 105/63 SpO2: 100 %  EBL: None  Complications: No immediate post-treatment complications observed by team, or reported by patient.  Note: The patient tolerated the entire procedure well. A repeat set of vitals were taken after the procedure and the patient was kept under observation following institutional policy, for this type of procedure. Post-procedural neurological assessment was performed,  showing return to baseline, prior to discharge. The patient was provided with post-procedure discharge instructions, including a section on how to identify potential problems. Should any problems arise concerning this procedure, the patient was given instructions to immediately contact us, at any time, without  hesitation. In any case, we plan to contact the patient by telephone for a follow-up status report regarding this interventional procedure.  Comments:  No additional relevant information.  Plan of Care  Orders:  Orders Placed This Encounter  Procedures   DG PAIN CLINIC C-ARM 1-60 MIN NO REPORT    Intraoperative interpretation by procedural physician at [place].    Standing Status:   Standing    Number of Occurrences:   1    Order Specific Question:   Reason for exam:    Answer:   Assistance in needle guidance and placement for procedures requiring needle placement in or near specific anatomical locations not easily accessible without such assistance.   Patient instructed to restart Xarelto tomorrow so long as he is not having any LE weakness.    Medications ordered for procedure: Meds ordered this encounter  Medications   lidocaine (XYLOCAINE) 2 % (with pres) injection 400 mg   fentaNYL (SUBLIMAZE) injection 25-50 mcg    Make sure Narcan is available in the pyxis when using this medication. In the event of respiratory depression (RR< 8/min): Titrate NARCAN (naloxone) in increments of 0.1 to 0.2 mg IV at 2-3 minute intervals, until desired degree of reversal.   ropivacaine (PF) 2 mg/mL (0.2%) (NAROPIN) injection 2 mL   dexamethasone (DECADRON) injection 10 mg   Medications administered: We administered lidocaine, fentaNYL, ropivacaine (PF) 2 mg/mL (0.2%), and dexamethasone.  See the medical record for exact dosing, route, and time of administration.  Disposition: Discharge home  Discharge Date & Time: 02/23/2019; 1015 hrs.   Follow-up plan:   Return in about  5 weeks (around 03/30/2019) for Post Procedure Evaluation, virtual.     Future Appointments  Date Time Provider [place]  04/07/2019  8:45 AM Gillis Santa, MD [facility] None   Primary Care Physician: Adin Hector, MD Location: [facility] Note by: Gillis Santa, MD Date: 02/23/2019; Time: 11:49 AM  Disclaimer:  Medicine is not an exact science. The only guarantee in medicine is that nothing is guaranteed. It is important to note that the decision to proceed with this intervention was based on the information collected from the patient. The Data and conclusions were drawn from the patient's questionnaire, the interview, and the physical examination. Because the information was provided in large part by the patient, it cannot be guaranteed that it has not been purposely or unconsciously manipulated. Every effort has been made to obtain as much relevant data as possible for this evaluation. It is important to note that the conclusions that lead to this procedure are derived in large part from the available data. Always take into account that the treatment will also be dependent on availability of resources and existing treatment guidelines, considered by other Pain Management Practitioners as being common knowledge and practice, at the time of the intervention. For Medico-Legal purposes, it is also important to point out that variation in procedural techniques and pharmacological choices are the acceptable norm. The indications, contraindications, technique, and results of the above procedure should only be interpreted and judged by a Board-Certified Interventional Pain Specialist with extensive familiarity and expertise in the same exact procedure and technique.

## 2019-02-23 NOTE — Progress Notes (Signed)
Safety precautions to be maintained throughout the outpatient stay will include: orient to surroundings, keep bed in low position, maintain call bell within reach at all times, provide assistance with transfer out of bed and ambulation.  

## 2019-02-23 NOTE — Patient Instructions (Signed)

## 2019-02-24 ENCOUNTER — Telehealth: Payer: Self-pay | Admitting: *Deleted

## 2019-02-24 NOTE — Telephone Encounter (Signed)
No answer. LVM to call for any post procedure issues.

## 2019-03-16 DIAGNOSIS — C099 Malignant neoplasm of tonsil, unspecified: Secondary | ICD-10-CM | POA: Diagnosis not present

## 2019-04-06 ENCOUNTER — Telehealth: Payer: Self-pay | Admitting: *Deleted

## 2019-04-07 ENCOUNTER — Other Ambulatory Visit: Payer: Self-pay

## 2019-04-07 ENCOUNTER — Ambulatory Visit
Payer: No Typology Code available for payment source | Attending: Student in an Organized Health Care Education/Training Program | Admitting: Student in an Organized Health Care Education/Training Program

## 2019-04-07 ENCOUNTER — Encounter: Payer: Self-pay | Admitting: Student in an Organized Health Care Education/Training Program

## 2019-04-07 DIAGNOSIS — M533 Sacrococcygeal disorders, not elsewhere classified: Secondary | ICD-10-CM | POA: Insufficient documentation

## 2019-04-07 DIAGNOSIS — M25552 Pain in left hip: Secondary | ICD-10-CM

## 2019-04-07 DIAGNOSIS — G894 Chronic pain syndrome: Secondary | ICD-10-CM | POA: Diagnosis not present

## 2019-04-07 DIAGNOSIS — M25551 Pain in right hip: Secondary | ICD-10-CM | POA: Insufficient documentation

## 2019-04-07 DIAGNOSIS — M47816 Spondylosis without myelopathy or radiculopathy, lumbar region: Secondary | ICD-10-CM | POA: Diagnosis not present
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
## 2019-04-16 ENCOUNTER — Ambulatory Visit: Payer: BC Managed Care – PPO | Admitting: Student in an Organized Health Care Education/Training Program

## 2019-04-17 ENCOUNTER — Ambulatory Visit
Admission: RE | Admit: 2019-04-17 | Discharge: 2019-04-17 | Disposition: A | Discharge status: Home / Self Care | Payer: No Typology Code available for payment source | Attending: Student in an Organized Health Care Education/Training Program | Admitting: Student in an Organized Health Care Education/Training Program

## 2019-04-17 ENCOUNTER — Ambulatory Visit
Admission: RE | Admit: 2019-04-17 | Discharge: 2019-04-17 | Disposition: A | Discharge status: Home / Self Care | Payer: No Typology Code available for payment source | Source: Ambulatory Visit | Attending: Student in an Organized Health Care Education/Training Program | Admitting: Student in an Organized Health Care Education/Training Program

## 2019-04-17 DIAGNOSIS — M25552 Pain in left hip: Secondary | ICD-10-CM | POA: Insufficient documentation

## 2019-04-17 DIAGNOSIS — M1611 Unilateral primary osteoarthritis, right hip: Secondary | ICD-10-CM | POA: Diagnosis not present

## 2019-04-17 DIAGNOSIS — M533 Sacrococcygeal disorders, not elsewhere classified: Secondary | ICD-10-CM | POA: Diagnosis not present

## 2019-04-17 DIAGNOSIS — M25551 Pain in right hip: Secondary | ICD-10-CM | POA: Diagnosis not present

## 2019-04-17 DIAGNOSIS — M1612 Unilateral primary osteoarthritis, left hip: Secondary | ICD-10-CM | POA: Diagnosis not present

## 2019-04-27 ENCOUNTER — Encounter: Payer: Self-pay | Admitting: Student in an Organized Health Care Education/Training Program

## 2019-04-29 ENCOUNTER — Other Ambulatory Visit: Payer: Self-pay

## 2019-04-29 ENCOUNTER — Emergency Department
Admission: EM | Admit: 2019-04-29 | Discharge: 2019-04-29 | Disposition: A | Discharge status: Home / Self Care | Payer: No Typology Code available for payment source | Attending: Emergency Medicine | Admitting: Emergency Medicine

## 2019-04-29 ENCOUNTER — Emergency Department: Payer: No Typology Code available for payment source

## 2019-04-29 DIAGNOSIS — R42 Dizziness and giddiness: Secondary | ICD-10-CM | POA: Insufficient documentation

## 2019-04-29 DIAGNOSIS — Z20828 Contact with and (suspected) exposure to other viral communicable diseases: Secondary | ICD-10-CM | POA: Insufficient documentation

## 2019-04-29 DIAGNOSIS — Z79899 Other long term (current) drug therapy: Secondary | ICD-10-CM | POA: Insufficient documentation

## 2019-04-29 DIAGNOSIS — Z85818 Personal history of malignant neoplasm of other sites of lip, oral cavity, and pharynx: Secondary | ICD-10-CM | POA: Diagnosis not present

## 2019-04-29 DIAGNOSIS — Z96652 Presence of left artificial knee joint: Secondary | ICD-10-CM | POA: Insufficient documentation

## 2019-04-29 DIAGNOSIS — J029 Acute pharyngitis, unspecified: Secondary | ICD-10-CM | POA: Insufficient documentation

## 2019-04-29 DIAGNOSIS — R197 Diarrhea, unspecified: Secondary | ICD-10-CM | POA: Insufficient documentation

## 2019-04-29 DIAGNOSIS — Z7901 Long term (current) use of anticoagulants: Secondary | ICD-10-CM | POA: Diagnosis not present

## 2019-04-29 DIAGNOSIS — I10 Essential (primary) hypertension: Secondary | ICD-10-CM | POA: Insufficient documentation

## 2019-04-29 DIAGNOSIS — G5603 Carpal tunnel syndrome, bilateral upper limbs: Secondary | ICD-10-CM | POA: Diagnosis not present

## 2019-04-29 HISTORY — DX: Malignant (primary) neoplasm, unspecified: C80.1

## 2019-04-29 LAB — CBC
HCT: 38.7 % — ABNORMAL LOW (ref 39.0–52.0)
Hemoglobin: 12.9 g/dL — ABNORMAL LOW (ref 13.0–17.0)
MCH: 30.9 pg (ref 26.0–34.0)
MCHC: 33.3 g/dL (ref 30.0–36.0)
MCV: 92.8 fL (ref 80.0–100.0)
Platelets: 211 10*3/uL (ref 150–400)
RBC: 4.17 MIL/uL — ABNORMAL LOW (ref 4.22–5.81)
RDW: 12.5 % (ref 11.5–15.5)
WBC: 5.8 10*3/uL (ref 4.0–10.5)
nRBC: 0 % (ref 0.0–0.2)

## 2019-04-29 LAB — BASIC METABOLIC PANEL
Anion gap: 11 (ref 5–15)
BUN: 23 mg/dL — ABNORMAL HIGH (ref 6–20)
CO2: 26 mmol/L (ref 22–32)
Calcium: 9.5 mg/dL (ref 8.9–10.3)
Chloride: 100 mmol/L (ref 98–111)
Creatinine, Ser: 1.21 mg/dL (ref 0.61–1.24)
GFR calc Af Amer: >60 mL/min (ref >60)
GFR calc non Af Amer: >60 mL/min (ref >60)
Glucose, Bld: 102 mg/dL — ABNORMAL HIGH (ref 70–99)
Potassium: 4 mmol/L (ref 3.5–5.1)
Sodium: 137 mmol/L (ref 135–145)

## 2019-04-29 LAB — URINALYSIS, COMPLETE (UACMP) WITH MICROSCOPIC
Bacteria, UA: NONE SEEN
Bilirubin Urine: NEGATIVE
Glucose, UA: NEGATIVE mg/dL
Hgb urine dipstick: NEGATIVE
Ketones, ur: NEGATIVE mg/dL
Leukocytes,Ua: NEGATIVE
Nitrite: NEGATIVE
Protein, ur: NEGATIVE mg/dL
Specific Gravity, Urine: 1.006 (ref 1.005–1.030)
Squamous Epithelial / HPF: NONE SEEN (ref 0–5)
WBC, UA: NONE SEEN WBC/hpf (ref 0–5)
pH: 5 (ref 5.0–8.0)

## 2019-04-29 LAB — SARS CORONAVIRUS 2 BY RT PCR (HOSPITAL ORDER, PERFORMED IN ~~LOC~~ HOSPITAL LAB): SARS Coronavirus 2: NEGATIVE

## 2019-04-29 LAB — TROPONIN I (HIGH SENSITIVITY): Troponin I (High Sensitivity): 4 ng/L (ref <18)

## 2019-04-29 LAB — GLUCOSE, CAPILLARY: Glucose-Capillary: 99 mg/dL (ref 70–99)

## 2019-04-29 MED ORDER — MECLIZINE HCL 25 MG PO TABS: 25.0000 mg | ORAL_TABLET | Freq: Three times a day (TID) | ORAL | 0 refills | Status: DC | PRN

## 2019-04-29 MED ORDER — MECLIZINE HCL 25 MG PO TABS
50.0000 mg | ORAL_TABLET | Freq: Once | ORAL | Status: AC
  Administered 2019-04-29: 50 mg via ORAL
  Filled 2019-04-29: qty 2

## 2019-04-29 MED ORDER — SODIUM CHLORIDE 0.9 % IV BOLUS
1000.0000 mL | Freq: Once | INTRAVENOUS | Status: AC
  Administered 2019-04-29: 1000 mL via INTRAVENOUS

## 2019-04-29 NOTE — ED Triage Notes (Signed)
Reports feeling off balance upon awakening this AM, dizziness worse with lying down. Pt states that he has fallen X 3 times today, no LOC or injuries from falls. Pt denies any recent changes in mediations. Reports recent diarrhea. Denies abdominal pain. Pt has to walk slowly when ambulating. Pt alert and oriented X4, cooperative, RR even and unlabored, color WNL. Pt in NAD.

## 2019-04-29 NOTE — Discharge Instructions (Addendum)
Your work-up was reassuring including negative CT scan, negative cardiac markers, negative urine, negative covid.  Will trial some meclizine to see if that helps.  Can follow-up with your doctors appointment tomorrow.  Return to the ER with any other concerns.

## 2019-04-29 NOTE — ED Notes (Signed)
Pt to STAT desk, wanting to leave now due to wait time; pt ambulatory to triage without difficulty or distress noted; vs retaken; pt reports dizziness upon awakening this am that has persisted throughout the day; denies pain, denies any recent illness, denies hx of same; reports her BP was low today (SBP 90's) as taken by his wife; pt encouraged to stay & be evaluated further; voices agreeance

## 2019-04-29 NOTE — ED Provider Notes (Signed)
[facility] Emergency Department Provider Note  ____________________________________________   First MD Initiated Contact with Patient 04/29/19 2022     (approximate)  I have reviewed the triage vital signs and the nursing notes.   HISTORY  Chief Complaint Dizziness    HPI [name] is a 53 y.o. male with atrial fibrillation on Xarelto, throat cancer who presents with dizziness.  Patient stated past 2 weeks has had intermittent episodes of lightheadedness.  However today he had 3 episodes which is why he presented today.  He says he just feels the swimmyness in his head.  He says the episodes happen when he stands up, intermittent, better with sitting down.  Denies any chest pain, shortness of breath, abdominal pain, leg swelling.  He has had some diarrhea yesterday.  Has had a very mild sore throat that is since resolved.  He has no known coronavirus contacts.  For his cancer he was diagnosed with throat cancer status post chemotherapy and radiation 2 years ago.  Had his follow-up appointment did not have recurrence.  Patient does have a follow-up appointment tomorrow with his primary care doctor.     Past Medical History:  Diagnosis Date  . Anxiety   . Arthritis    gout  . Atrial fibrillation ([place]) 1989  . Bicuspid aortic valve    Last echo looks trileaflet  . Cardiomyopathy 08/2009   Mildly decreased EF  . Depression   . GERD (gastroesophageal reflux disease)   . Gout   . 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 Hyperlipidemia   . Hypertension   . Seizures ([place]) 1998   with heat stroke in the [employer] -none since  . throat cancer     Patient Active Problem List   Diagnosis Date Noted  . Sacroiliac joint pain 04/07/2019  . Bilateral hip pain (L>R) 04/07/2019  . Lumbar spondylosis 09/11/2018  . Lumbar facet arthropathy 09/11/2018  . Chronic pain syndrome 09/11/2018  . Left  ear pain 04/10/2017  . Patellar tendinitis of left knee 10/12/2016  . Acute pain of right knee 10/03/2016  . Cough, persistent 07/25/2016  . Acute bronchitis 06/26/2016  . Chronic low back pain with right-sided sciatica 12/23/2015  . Primary gout 11/10/2015  . Skin lesion 11/10/2015  . Preventative health care 06/22/2015  . Ventricular tachycardia, non-sustained//PVCs-Sx 11/27/2011  . Obesity 08/27/2011  . Long term current use of anticoagulant 10/24/2010  . GERD 06/13/2010  . Cardiomyopathy, nonischemic ([place]) 08/06/2009  . FATTY LIVER DISEASE 11/04/2008  . Hyperlipemia 08/04/2007  . Mood disorder ([place]) 08/04/2007  . GOUT 04/25/2007  . Essential hypertension 04/25/2007  . ATRIAL FIBRILLATION 04/25/2007    Past Surgical History:  Procedure Laterality Date  . CARDIAC CATHETERIZATION  03/2010   No sig coronary disease, EF ~40%  . CARDIOVERSION  1989  . TOTAL KNEE ARTHROPLASTY Left     Prior to Admission medications   Medication Sig Start Date End Date Taking? Authorizing Provider  allopurinol (ZYLOPRIM) 100 MG tablet Take 300 mg by mouth daily.    [provider]  atorvastatin (LIPITOR) 20 MG tablet take 1 tablet by mouth once daily 10/08/16   Venia Carbon, MD  carvedilol (COREG) 25 MG tablet 12.5 mg 2 (two) times daily with a meal.  11/03/18   [provider]  cholecalciferol (VITAMIN D3) 25 MCG (1000 UT) tablet Take 1,000 Units by mouth 2 (two) times  a day.    [provider]  Docusate Sodium (STOOL SOFTENER) 100 MG capsule Take 100 mg by mouth 2 (two) times daily.    [provider]  FLUoxetine (PROZAC) 20 MG capsule Take 1 capsule (20 mg total) by mouth daily. 02/21/18   Venia Carbon, MD  glucosamine-chondroitin 500-400 MG tablet Take 1 tablet by mouth 3 (three) times daily.    [provider]  HYDROcodone-acetaminophen (NORCO/VICODIN) 5-325 MG tablet take 1 to 2 tablets by mouth every 4 hours if needed 05/07/17   [provider]  Ibuprofen 200 MG CAPS Take 800 mg by mouth 2 (two) times daily as needed.     [provider]  levothyroxine (SYNTHROID, LEVOTHROID) 50 MCG tablet Take 50 mcg by mouth daily before breakfast.    [provider]  magnesium oxide (MAG-OX) 400 MG tablet Take 400 mg by mouth daily.    [provider]  Melatonin 5 MG CAPS Take 20 mg by mouth at bedtime. Takes 2 capsules po at bedtime for sleep.     [provider]  Multiple Vitamin (MULTIVITAMIN) tablet Take 1 tablet by mouth daily.    [provider]  rivaroxaban (XARELTO) 20 MG TABS tablet Take 1 tablet (20 mg total) by mouth daily. 12/18/17   Deboraha Sprang, MD  sacubitril-valsartan (ENTRESTO) 49-51 MG Take 1 tablet by mouth 2 (two) times daily. 12/15/18   Deboraha Sprang, MD  vitamin C (ASCORBIC ACID) 500 MG tablet Take 500 mg by mouth daily.     [provider]    Allergies Patient has no known allergies.  Family History  Problem Relation Age of Onset  . Heart attack Father        MI  . Crohn's disease Brother   . Diabetes Mother   . Depression Mother   . Arthritis Mother   . COPD Mother   . Coronary artery disease Paternal Grandfather   . Pancreatic cancer Paternal Uncle   . Lung cancer Paternal Uncle   . Lung cancer Maternal Grandfather   . Thyroid cancer Paternal Aunt   . Lung cancer Paternal Uncle   . Hypertension Neg Hx   . Colon cancer Neg Hx   . Colon polyps Neg Hx   . Esophageal cancer Neg Hx   . Rectal cancer Neg Hx   . Stomach cancer Neg Hx     Social History Social History   Tobacco Use  . Smoking status: Never Smoker  . Smokeless tobacco: Never Used  Substance Use Topics  . Alcohol use: Yes    Alcohol/week: 2.0 - 3.0 standard drinks    Types: 2 - 3 Cans of beer per week    Comment: occassional  . Drug use: No      Review of Systems Constitutional: No fever/chills, positive lightheadedness Eyes: No visual changes. ENT: Positive sore  throat now resolved Cardiovascular: Denies chest pain. Respiratory: Denies shortness of breath. Gastrointestinal: No abdominal pain.  No nausea, no vomiting.  Positive diarrhea.  No constipation. Genitourinary: Negative for dysuria. Musculoskeletal: Negative for back pain. Skin: Negative for rash. Neurological: Negative for headaches, focal weakness or numbness. All other ROS negative ____________________________________________   PHYSICAL EXAM:  VITAL SIGNS: ED Triage Vitals  Enc Vitals Group     BP 04/29/19 1815 (!) 145/64     Pulse Rate 04/29/19 1815 68     Resp 04/29/19 1815 18     Temp 04/29/19 1815 98 F (36.7 C)  Temp Source 04/29/19 1815 Oral     SpO2 04/29/19 1815 98 %     Weight 04/29/19 1816 235 lb (106.6 kg)     Height 04/29/19 1816 5\' 10"  (1.778 m)     Head Circumference --      Peak Flow --      Pain Score 04/29/19 1815 0     Pain Loc --      Pain Edu? --      Excl. in [place]? --     Constitutional: Alert and oriented. Well appearing and in no acute distress. Eyes: Conjunctivae are normal. EOMI. Head: Atraumatic. Nose: No congestion/rhinnorhea. Mouth/Throat: Mucous membranes are moist.   Neck: No stridor. Trachea Midline. FROM Cardiovascular: Normal rate, regular rhythm. Grossly normal heart sounds.  Good peripheral circulation. Respiratory: Normal respiratory effort.  No retractions. Lungs CTAB. Gastrointestinal: Soft and nontender. No distention. No abdominal bruits.  Musculoskeletal: No lower extremity tenderness nor edema.  No joint effusions. Neurologic:  Normal speech and language.  Cranial 2 through 12 are intact Skin:  Skin is warm, dry and intact. No rash noted. Psychiatric: Mood and affect are normal. Speech and behavior are normal. GU: Deferred   ____________________________________________   LABS (all labs ordered are listed, but only abnormal results are displayed)  Labs Reviewed  BASIC METABOLIC PANEL - Abnormal; Notable for the  following components:      Result Value   Glucose, Bld 102 (*)    BUN 23 (*)    All other components within normal limits  CBC - Abnormal; Notable for the following components:   RBC 4.17 (*)    Hemoglobin 12.9 (*)    HCT 38.7 (*)    All other components within normal limits  URINALYSIS, COMPLETE (UACMP) WITH MICROSCOPIC - Abnormal; Notable for the following components:   Color, Urine STRAW (*)    APPearance CLEAR (*)    All other components within normal limits  SARS CORONAVIRUS 2 (HOSPITAL ORDER, [place] [facility])  GLUCOSE, CAPILLARY  TROPONIN I (HIGH SENSITIVITY)  TROPONIN I (HIGH SENSITIVITY)   ____________________________________________   ED ECG REPORT I, Vanessa Omer, the attending physician, personally viewed and interpreted this ECG.  EKG is normal sinus rate of 64, no ST elevation, no T wave inversion, normal intervals ____________________________________________  RADIOLOGY  Official radiology report(s): Ct Head Wo Contrast  Result Date: 04/29/2019 CLINICAL DATA:  Reports feeling off balance upon awakening this AM, dizziness worse with lying down. Pt states that he has fallen X 3 times today, no LOC or injuries from falls. EXAM: CT HEAD WITHOUT CONTRAST TECHNIQUE: Contiguous axial images were obtained from the base of the skull through the vertex without intravenous contrast. COMPARISON:  None. FINDINGS: Brain: No evidence of acute infarction, hemorrhage, hydrocephalus, extra-axial collection or mass lesion/mass effect. Vascular: No hyperdense vessel or unexpected calcification. Skull: Normal. Negative for fracture or focal lesion. Sinuses/Orbits: No acute finding. Other: None. IMPRESSION: No acute intracranial process. Electronically Signed   By: Audie Pinto M.D.   On: 04/29/2019 21:13    ____________________________________________   PROCEDURES  Procedure(s) performed (including Critical Care):  Procedures    ____________________________________________   INITIAL IMPRESSION / ASSESSMENT AND PLAN / ED COURSE  Dorin Xie was evaluated in Emergency Department on 04/29/2019 for the symptoms described in the history of present illness. He was evaluated in the context of the global COVID-19 pandemic, which necessitated consideration that the patient might be at risk for infection with the SARS-CoV-2  virus that causes COVID-19. Institutional protocols and algorithms that pertain to the evaluation of patients at risk for COVID-19 are in a state of rapid change based on information released by regulatory bodies including the CDC and federal and state organizations. These policies and algorithms were followed during the patient's care in the ED.    Patient is a 53 year old with A. fib on blood thinner who presents with 3 episodes today of feeling lightheaded with standing.  He did have one episode where he fell back onto a wall.  Did not ever lose consciousness.  Given patient is on a blood thinner will get CT scan to evaluate for epidural subdural hematoma.  Also get CT scan to evaluate for metastatic cancer although lower suspicion given his is currently in remission.  Will get labs to evaluate for anemia, AKI, electrolyte abnormalities.  Cardiac markers will be obtained to evaluate for ACS.  Also get orthostatic vitals.   Troponin is 4.  CT head was negative.  Hemoglobin is around baseline.  Patient's orthostatics were negative.  Patient was given 1 L of fluid.  I reexamined patient's ears no TM perforation.  I said this could be due to some vertigo.  Will give some meclizine and IV fluid.  Patient feels better after the meclizine.  Patient is requesting to go home at this time.  Patient is got follow-up tomorrow with his PCP.  We will give a prescription of meclizine.  Patient understands return precautions.   ____________________________________________   FINAL CLINICAL IMPRESSION(S) / ED  DIAGNOSES   Final diagnoses:  Vertigo      MEDICATIONS GIVEN DURING THIS VISIT:  Medications  sodium chloride 0.9 % bolus 1,000 mL (0 mLs Intravenous Stopped 04/29/19 2241)  meclizine (ANTIVERT) tablet 50 mg (50 mg Oral Given 04/29/19 2155)     ED Discharge Orders         Ordered    meclizine (ANTIVERT) 25 MG tablet  3 times daily PRN     04/29/19 2245           Note:  This document was prepared using Dragon voice recognition software and may include unintentional dictation errors.   Vanessa Benton, MD 04/29/19 2259

## 2019-04-30 DIAGNOSIS — I428 Other cardiomyopathies: Secondary | ICD-10-CM | POA: Diagnosis not present

## 2019-04-30 DIAGNOSIS — I10 Essential (primary) hypertension: Secondary | ICD-10-CM | POA: Diagnosis not present

## 2019-04-30 DIAGNOSIS — R42 Dizziness and giddiness: Secondary | ICD-10-CM | POA: Diagnosis not present

## 2019-04-30 DIAGNOSIS — I480 Paroxysmal atrial fibrillation: Secondary | ICD-10-CM | POA: Diagnosis not present

## 2019-05-05 ENCOUNTER — Encounter: Payer: Self-pay | Admitting: Student in an Organized Health Care Education/Training Program

## 2019-05-05 ENCOUNTER — Telehealth: Payer: Self-pay | Admitting: Student in an Organized Health Care Education/Training Program

## 2019-05-05 DIAGNOSIS — M47816 Spondylosis without myelopathy or radiculopathy, lumbar region: Secondary | ICD-10-CM

## 2019-05-05 DIAGNOSIS — G5602 Carpal tunnel syndrome, left upper limb: Secondary | ICD-10-CM | POA: Diagnosis not present

## 2019-05-05 DIAGNOSIS — M25532 Pain in left wrist: Secondary | ICD-10-CM | POA: Diagnosis not present

## 2019-05-05 DIAGNOSIS — M25531 Pain in right wrist: Secondary | ICD-10-CM | POA: Diagnosis not present

## 2019-05-05 DIAGNOSIS — G5601 Carpal tunnel syndrome, right upper limb: Secondary | ICD-10-CM | POA: Diagnosis not present

## 2019-05-05 NOTE — Telephone Encounter (Signed)
Patient states his pain is worse and he wants another RF. Is it ok to schedule this?

## 2019-05-05 NOTE — Telephone Encounter (Signed)
He has already sent an e-mail and I have sent a message to Dr. Holley Raring to ask about this. Thanks

## 2019-05-07 DIAGNOSIS — Z923 Personal history of irradiation: Secondary | ICD-10-CM | POA: Diagnosis not present

## 2019-05-07 DIAGNOSIS — C099 Malignant neoplasm of tonsil, unspecified: Secondary | ICD-10-CM | POA: Diagnosis not present

## 2019-05-07 DIAGNOSIS — H8111 Benign paroxysmal vertigo, right ear: Secondary | ICD-10-CM | POA: Diagnosis not present

## 2019-05-20 DIAGNOSIS — Z872 Personal history of diseases of the skin and subcutaneous tissue: Secondary | ICD-10-CM | POA: Diagnosis not present

## 2019-05-20 DIAGNOSIS — L821 Other seborrheic keratosis: Secondary | ICD-10-CM | POA: Diagnosis not present

## 2019-05-20 DIAGNOSIS — L814 Other melanin hyperpigmentation: Secondary | ICD-10-CM | POA: Diagnosis not present

## 2019-05-20 DIAGNOSIS — L738 Other specified follicular disorders: Secondary | ICD-10-CM | POA: Diagnosis not present

## 2019-05-25 ENCOUNTER — Encounter: Payer: Self-pay | Admitting: Student in an Organized Health Care Education/Training Program

## 2019-05-25 ENCOUNTER — Other Ambulatory Visit: Payer: Self-pay

## 2019-05-25 ENCOUNTER — Ambulatory Visit (HOSPITAL_BASED_OUTPATIENT_CLINIC_OR_DEPARTMENT_OTHER)
Payer: No Typology Code available for payment source | Admitting: Student in an Organized Health Care Education/Training Program

## 2019-05-25 ENCOUNTER — Ambulatory Visit
Admission: RE | Admit: 2019-05-25 | Discharge: 2019-05-25 | Disposition: A | Discharge status: Home / Self Care | Payer: No Typology Code available for payment source | Source: Ambulatory Visit | Attending: Student in an Organized Health Care Education/Training Program | Admitting: Student in an Organized Health Care Education/Training Program

## 2019-05-25 DIAGNOSIS — M47816 Spondylosis without myelopathy or radiculopathy, lumbar region: Secondary | ICD-10-CM | POA: Insufficient documentation

## 2019-05-25 MED ORDER — DEXAMETHASONE SODIUM PHOSPHATE 10 MG/ML IJ SOLN
10.0000 mg | Freq: Once | INTRAMUSCULAR | Status: AC
  Administered 2019-05-25: 10 mg
  Filled 2019-05-25: qty 1

## 2019-05-25 MED ORDER — TRIAMCINOLONE ACETONIDE 40 MG/ML IJ SUSP
INTRAMUSCULAR | Status: AC
  Filled 2019-05-25: qty 1

## 2019-05-25 MED ORDER — FENTANYL CITRATE (PF) 100 MCG/2ML IJ SOLN
25.0000 ug | INTRAMUSCULAR | Status: DC | PRN
  Administered 2019-05-25: 50 ug via INTRAVENOUS

## 2019-05-25 MED ORDER — ROPIVACAINE HCL 2 MG/ML IJ SOLN
1.0000 mL | Freq: Once | INTRAMUSCULAR | Status: AC
  Administered 2019-05-25: 1 mL via EPIDURAL

## 2019-05-25 MED ORDER — ROPIVACAINE HCL 2 MG/ML IJ SOLN
INTRAMUSCULAR | Status: AC
  Filled 2019-05-25: qty 10

## 2019-05-25 MED ORDER — FENTANYL CITRATE (PF) 100 MCG/2ML IJ SOLN
INTRAMUSCULAR | Status: AC
  Filled 2019-05-25: qty 2

## 2019-05-25 MED ORDER — LIDOCAINE HCL 2 % IJ SOLN
INTRAMUSCULAR | Status: AC
  Filled 2019-05-25: qty 20

## 2019-05-25 MED ORDER — MIDAZOLAM HCL 5 MG/5ML IJ SOLN
INTRAMUSCULAR | Status: AC
  Filled 2019-05-25: qty 5

## 2019-05-25 MED ORDER — LIDOCAINE HCL 2 % IJ SOLN
20.0000 mL | Freq: Once | INTRAMUSCULAR | Status: AC
  Administered 2019-05-25: 400 mg

## 2019-05-25 NOTE — Progress Notes (Signed)
Patient's Name: [name]  MRN: [id number]  Referring Provider: Gillis Santa, MD  DOB: [birth year]  PCP: Adin Hector, MD  DOS: 05/25/2019  Note by: Gillis Santa, MD  Service setting: Ambulatory outpatient  Specialty: Interventional Pain Management  Patient type: Established  Location: [facility] (AMB) Pain Management Facility  Visit type: Interventional Procedure   Primary Reason for Visit: Interventional Pain Management Treatment. CC: Back Pain (lower)  Procedure:          Anesthesia, Analgesia, Anxiolysis:  Type: Thermal Lumbar Facet, Medial Branch Radiofrequency Ablation/Neurotomy  #2 (previously done 3/16)  Primary Purpose: Therapeutic Region: Posterolateral Lumbosacral Spine Level:  L3, L4, L5, Medial Branch Level(s). These levels will denervate the L3-4, L4-5,lumbar facet joints. Laterality: Right  Type: Moderate (Conscious) Sedation combined with Local Anesthesia Indication(s): Analgesia and Anxiety Route: Intravenous (IV) IV Access: Secured Sedation: Meaningful verbal contact was maintained at all times during the procedure  Local Anesthetic: Lidocaine 1-2%  Position: Prone   Indications: 1. Lumbar spondylosis    Mr. Tefft has been dealing with the above chronic pain for longer than three months and has either failed to respond, was unable to tolerate, or simply did not get enough benefit from other more conservative therapies including, but not limited to: 1. Over-the-counter medications 2. Anti-inflammatory medications 3. Muscle relaxants 4. Membrane stabilizers 5. Opioids 6. Physical therapy and/or chiropractic manipulation 7. Modalities (Heat, ice, etc.) 8. Invasive techniques such as nerve blocks. Mr. Partington has attained more than 50% relief of the pain from a series of diagnostic injections conducted in separate occasions.  Pain Score: Pre-procedure: 5 /10 Post-procedure: 0-No pain/10  Stopped Xarelto 3 days ago, last dose Thurs evening.  Pre-op  Assessment:  Mr. Dowtin is a 53 y.o. (year old), male patient, seen today for interventional treatment. He  has a past surgical history that includes Cardioversion (1989); Cardiac catheterization (03/2010); and Total knee arthroplasty (Left). Mr. Scavetta has a current medication list which includes the following prescription(s): allopurinol, atorvastatin, carvedilol, cholecalciferol, docusate sodium, fluoxetine, glucosamine-chondroitin, hydrocodone-acetaminophen, ibuprofen, levothyroxine, magnesium oxide, meclizine, melatonin, multivitamin, sacubitril-valsartan, vitamin c, and rivaroxaban, and the following Facility-Administered Medications: fentanyl. His primarily concern today is the Back Pain (lower)  Initial Vital Signs:  Pulse/HCG Rate: 65  Temp: 98.3 F (36.8 C) Resp: 16 BP: 115/61 SpO2: 98 %  BMI: Estimated body mass index is 33.72 kg/m as calculated from the following:   Height as of this encounter: 5\' 10"  (1.778 m).   Weight as of this encounter: 235 lb (106.6 kg).  Risk Assessment: Allergies: Reviewed. He has No Known Allergies.  Allergy Precautions: None required Coagulopathies: Reviewed. None identified.  Blood-thinner therapy: None at this time Active Infection(s): Reviewed. None identified. Mr. Terhaar is afebrile  Site Confirmation: Mr. Jennette was asked to confirm the procedure and laterality before marking the site Procedure checklist: Completed Consent: Before the procedure and under the influence of no sedative(s), amnesic(s), or anxiolytics, the patient was informed of the treatment options, risks and possible complications. To fulfill our ethical and legal obligations, as recommended by the American Medical Association's Code of Ethics, I have informed the patient of my clinical impression; the nature and purpose of the treatment or procedure; the risks, benefits, and possible complications of the intervention; the alternatives, including doing nothing; the risk(s) and  benefit(s) of the alternative treatment(s) or procedure(s); and the risk(s) and benefit(s) of doing nothing. The patient was provided information about the general risks and possible complications associated with the procedure. These  may include, but are not limited to: failure to achieve desired goals, infection, bleeding, organ or nerve damage, allergic reactions, paralysis, and death. In addition, the patient was informed of those risks and complications associated to Spine-related procedures, such as failure to decrease pain; infection (i.e.: Meningitis, epidural or intraspinal abscess); bleeding (i.e.: epidural hematoma, subarachnoid hemorrhage, or any other type of intraspinal or peri-dural bleeding); organ or nerve damage (i.e.: Any type of peripheral nerve, nerve root, or spinal cord injury) with subsequent damage to sensory, motor, and/or autonomic systems, resulting in permanent pain, numbness, and/or weakness of one or several areas of the body; allergic reactions; (i.e.: anaphylactic reaction); and/or death. Furthermore, the patient was informed of those risks and complications associated with the medications. These include, but are not limited to: allergic reactions (i.e.: anaphylactic or anaphylactoid reaction(s)); adrenal axis suppression; blood sugar elevation that in diabetics may result in ketoacidosis or comma; water retention that in patients with history of congestive heart failure may result in shortness of breath, pulmonary edema, and decompensation with resultant heart failure; weight gain; swelling or edema; medication-induced neural toxicity; particulate matter embolism and blood vessel occlusion with resultant organ, and/or nervous system infarction; and/or aseptic necrosis of one or more joints. Finally, the patient was informed that Medicine is not an exact science; therefore, there is also the possibility of unforeseen or unpredictable risks and/or possible complications that may  result in a catastrophic outcome. The patient indicated having understood very clearly. We have given the patient no guarantees and we have made no promises. Enough time was given to the patient to ask questions, all of which were answered to the patient's satisfaction. Mr. Morles has indicated that he wanted to continue with the procedure. Attestation: I, the ordering provider, attest that I have discussed with the patient the benefits, risks, side-effects, alternatives, likelihood of achieving goals, and potential problems during recovery for the procedure that I have provided informed consent. Date   Time: 05/25/2019  7:53 AM  Pre-Procedure Preparation:  Monitoring: As per clinic protocol. Respiration, ETCO2, SpO2, BP, heart rate and rhythm monitor placed and checked for adequate function Safety Precautions: Patient was assessed for positional comfort and pressure points before starting the procedure. Time-out: I initiated and conducted the "Time-out" before starting the procedure, as per protocol. The patient was asked to participate by confirming the accuracy of the "Time Out" information. Verification of the correct person, site, and procedure were performed and confirmed by me, the nursing staff, and the patient. "Time-out" conducted as per Joint Commission's Universal Protocol (UP.01.01.01). Time: [id number]  Description of Procedure:          Laterality: Right Levels: L3, L4, L5,Medial Branch Level(s), at the L3-4, L4-5,  lumbar facet joints. Area Prepped: Lumbosacral Prepping solution: ChloraPrep (2% chlorhexidine gluconate and 70% isopropyl alcohol) Safety Precautions: Aspiration looking for blood return was conducted prior to all injections. At no point did we inject any substances, as a needle was being advanced. Before injecting, the patient was told to immediately notify me if he was experiencing any new onset of "ringing in the ears, or metallic taste in the mouth". No attempts were made at  seeking any paresthesias. Safe injection practices and needle disposal techniques used. Medications properly checked for expiration dates. SDV (single dose vial) medications used. After the completion of the procedure, all disposable equipment used was discarded in the proper designated medical waste containers. Local Anesthesia: Protocol guidelines were followed. The patient was positioned over the fluoroscopy table. The area  was prepped in the usual manner. The time-out was completed. The target area was identified using fluoroscopy. A 12-in long, straight, sterile hemostat was used with fluoroscopic guidance to locate the targets for each level blocked. Once located, the skin was marked with an approved surgical skin marker. Once all sites were marked, the skin (epidermis, dermis, and hypodermis), as well as deeper tissues (fat, connective tissue and muscle) were infiltrated with a small amount of a short-acting local anesthetic, loaded on a 10cc syringe with a 25G, 1.5-in  Needle. An appropriate amount of time was allowed for local anesthetics to take effect before proceeding to the next step. Local Anesthetic: Lidocaine 2.0% The unused portion of the local anesthetic was discarded in the proper designated containers. Technical explanation of process:  Radiofrequency Ablation (RFA)  L3 Medial Branch Nerve RFA: The target area for the L3 medial branch is at the junction of the postero-lateral aspect of the superior articular process and the superior, posterior, and medial edge of the transverse process of L4. Under fluoroscopic guidance, a Radiofrequency needle was inserted until contact was made with os over the superior postero-lateral aspect of the pedicular shadow (target area). Sensory and motor testing was conducted to properly adjust the position of the needle. Once satisfactory placement of the needle was achieved, the numbing solution was slowly injected after negative aspiration for blood. 67mL of  the nerve block solution was injected without difficulty or complication. After waiting for at least 3 minutes, the ablation was performed. Once completed, the needle was removed intact. L4 Medial Branch Nerve RFA: The target area for the L4 medial branch is at the junction of the postero-lateral aspect of the superior articular process and the superior, posterior, and medial edge of the transverse process of L5. Under fluoroscopic guidance, a Radiofrequency needle was inserted until contact was made with os over the superior postero-lateral aspect of the pedicular shadow (target area). Sensory and motor testing was conducted to properly adjust the position of the needle. Once satisfactory placement of the needle was achieved, the numbing solution was slowly injected after negative aspiration for blood. 52mL of the nerve block solution was injected without difficulty or complication. After waiting for at least 3 minutes, the ablation was performed. Once completed, the needle was removed intact. L5 Medial Branch Nerve RFA: The target area for the L5 medial branch is at the junction of the postero-lateral aspect of the superior articular process of S1 and the superior, posterior, and medial edge of the sacral ala. Under fluoroscopic guidance, a Radiofrequency needle was inserted until contact was made with os over the superior postero-lateral aspect of the pedicular shadow (target area). Sensory and motor testing was conducted to properly adjust the position of the needle. Once satisfactory placement of the needle was achieved, the numbing solution was slowly injected after negative aspiration for blood.2 mL of the nerve block solution was injected without difficulty or complication. After waiting for at least 3 minutes, the ablation was performed. Once completed, the needle was removed intact.   Radiofrequency lesioning (ablation):  Radiofrequency Generator: NeuroTherm [id number] Sensory Stimulation Parameters: 50  Hz was used to locate & identify the nerve, making sure that the needle was positioned such that there was no sensory stimulation below 0.3 V or above 0.7 V. Motor Stimulation Parameters: 2 Hz was used to evaluate the motor component. Care was taken not to lesion any nerves that demonstrated motor stimulation of the lower extremities at an output of less than 2.5 times  that of the sensory threshold, or a maximum of 2.0 V. Lesioning Technique Parameters: Standard Radiofrequency settings. (Not bipolar or pulsed.) Temperature Settings: 80 degrees C Lesioning time: 60 seconds Intra-operative Compliance: Compliant Materials & Medications: Needle(s) (Electrode/Cannula) Type: Teflon-coated, curved tip, Radiofrequency needle(s) Gauge: 22G Length: 10cm Numbing solution: 6 cc solution made of 5 cc of 0.2% ropivacaine, 1 cc of Decadron 10 mg/cc.  2 cc injected at each level above on the right prior to ablation.  The unused portion of the solution was discarded in the proper designated containers.  Once the entire procedure was completed, the treated area was cleaned, making sure to leave some of the prepping solution back to take advantage of its long term bactericidal properties.  Illustration of the posterior view of the lumbar spine and the posterior neural structures. Laminae of L2 through S1 are labeled. DPRL5, dorsal primary ramus of L5; DPRS1, dorsal primary ramus of S1; DPR3, dorsal primary ramus of L3; FJ, facet (zygapophyseal) joint L3-L4; I, inferior articular process of L4; LB1, lateral branch of dorsal primary ramus of L1; IAB, inferior articular branches from L3 medial branch (supplies L4-L5 facet joint); IBP, intermediate branch plexus; MB3, medial branch of dorsal primary ramus of L3; NR3, third lumbar nerve root; S, superior articular process of L5; SAB, superior articular branches from L4 (supplies L4-5 facet joint also); TP3, transverse process of L3.  Vitals:   05/25/19 0850 05/25/19 0900  05/25/19 0910 05/25/19 0921  BP: 100/60 106/62 110/64 106/63  Pulse: 65 60 66 65  Resp: 12 11 13 14   Temp:  98.1 F (36.7 C)  98.1 F (36.7 C)  TempSrc:      SpO2: 95% 96% 99% 99%  Weight:      Height:        Start Time: 0835 hrs. End Time: 0850 hrs.  Imaging Guidance (Spinal):          Type of Imaging Technique: Fluoroscopy Guidance (Spinal) Indication(s): Assistance in needle guidance and placement for procedures requiring needle placement in or near specific anatomical locations not easily accessible without such assistance. Exposure Time: Please see nurses notes. Contrast: None used. Fluoroscopic Guidance: I was personally present during the use of fluoroscopy. "Tunnel Vision Technique" used to obtain the best possible view of the target area. Parallax error corrected before commencing the procedure. "Direction-depth-direction" technique used to introduce the needle under continuous pulsed fluoroscopy. Once target was reached, antero-posterior, oblique, and lateral fluoroscopic projection used confirm needle placement in all planes. Images permanently stored in EMR. Interpretation: No contrast injected. I personally interpreted the imaging intraoperatively. Adequate needle placement confirmed in multiple planes. Permanent images saved into the patient's record.  Antibiotic Prophylaxis:   Anti-infectives (From admission, onward)   None     Indication(s): None identified  Post-operative Assessment:  Post-procedure Vital Signs:  Pulse/HCG Rate: 65  Temp: 98.1 F (36.7 C) Resp: 14 BP: 106/63 SpO2: 99 %  EBL: None  Complications: No immediate post-treatment complications observed by team, or reported by patient.  Note: The patient tolerated the entire procedure well. A repeat set of vitals were taken after the procedure and the patient was kept under observation following institutional policy, for this type of procedure. Post-procedural neurological assessment was  performed, showing return to baseline, prior to discharge. The patient was provided with post-procedure discharge instructions, including a section on how to identify potential problems. Should any problems arise concerning this procedure, the patient was given instructions to immediately contact us, at any time, without  hesitation. In any case, we plan to contact the patient by telephone for a follow-up status report regarding this interventional procedure.  Comments:  No additional relevant information.  Plan of Care  Orders:  Orders Placed This Encounter  Procedures   Radiofrequency,Lumbar    Standing Status:   Future    Standing Expiration Date:   11/22/2020    Scheduling Instructions:     Side(s): Left-sided     Level: L3-4, L4-5,  Facets ( L3, L4, L5,  Medial Branch Nerves)     Sedation: With Sedation     Scheduling Timeframe: 2 weeks    Order Specific Question:   Where will this procedure be performed?    Answer:   [facility]   DG PAIN [facility] C-ARM 1-60 MIN NO REPORT    Intraoperative interpretation by procedural physician at [facility].    Standing Status:   Standing    Number of Occurrences:   1    Order Specific Question:   Reason for exam:    Answer:   Assistance in needle guidance and placement for procedures requiring needle placement in or near specific anatomical locations not easily accessible without such assistance.   Patient instructed to restart Xarelto tomorrow so long as he is not having any LE weakness.  Will return for left L3, L4, L5 RFA in 2 weeks.  Patient instructed to stop Xarelto again 3 days prior to left RFA.  Medications ordered for procedure: Meds ordered this encounter  Medications   fentaNYL (SUBLIMAZE) injection 25-50 mcg    Make sure Narcan is available in the pyxis when using this medication. In the event of respiratory depression (RR< 8/min): Titrate NARCAN (naloxone) in increments of 0.1 to 0.2 mg IV at 2-3 minute  intervals, until desired degree of reversal.   dexamethasone (DECADRON) injection 10 mg   ropivacaine (PF) 2 mg/mL (0.2%) (NAROPIN) injection 1 mL   lidocaine (XYLOCAINE) 2 % (with pres) injection 400 mg   Medications administered: We administered fentaNYL, dexamethasone, ropivacaine (PF) 2 mg/mL (0.2%), and lidocaine.  See the medical record for exact dosing, route, and time of administration.  Disposition: Discharge home  Discharge Date & Time: 05/25/2019; 0932 hrs.   Follow-up plan:   Return in about 2 weeks (around 06/08/2019) for Contra-lateral RFA Left L3, 4, 5 RFA, with sedation (stop Xarelto 3 days prior).     Future Appointments  Date Time Provider [place]  06/10/2019  8:00 AM Gillis Santa, MD [facility] None   Primary Care Physician: Adin Hector, MD Location: [facility] Note by: Gillis Santa, MD Date: 05/25/2019; Time: 10:01 AM  Disclaimer:  Medicine is not an exact science. The only guarantee in medicine is that nothing is guaranteed. It is important to note that the decision to proceed with this intervention was based on the information collected from the patient. The Data and conclusions were drawn from the patient's questionnaire, the interview, and the physical examination. Because the information was provided in large part by the patient, it cannot be guaranteed that it has not been purposely or unconsciously manipulated. Every effort has been made to obtain as much relevant data as possible for this evaluation. It is important to note that the conclusions that lead to this procedure are derived in large part from the available data. Always take into account that the treatment will also be dependent on availability of resources and existing treatment guidelines, considered by other Pain Management Practitioners  as being common knowledge and practice, at the time of the intervention. For Medico-Legal purposes, it is also important  to point out that variation in procedural techniques and pharmacological choices are the acceptable norm. The indications, contraindications, technique, and results of the above procedure should only be interpreted and judged by a Board-Certified Interventional Pain Specialist with extensive familiarity and expertise in the same exact procedure and technique.

## 2019-05-25 NOTE — Progress Notes (Signed)
Safety precautions to be maintained throughout the outpatient stay will include: orient to surroundings, keep bed in low position, maintain call bell within reach at all times, provide assistance with transfer out of bed and ambulation.  

## 2019-05-25 NOTE — Patient Instructions (Signed)
Contra-lateral RFA Left L3, 4, 5 RFA, with sedation (stop Xarelto 3 days prior)

## 2019-05-26 ENCOUNTER — Telehealth: Payer: Self-pay | Admitting: *Deleted

## 2019-05-26 NOTE — Telephone Encounter (Signed)
No answer. LVM ?

## 2019-06-08 ENCOUNTER — Ambulatory Visit
Payer: No Typology Code available for payment source | Admitting: Student in an Organized Health Care Education/Training Program

## 2019-06-10 ENCOUNTER — Ambulatory Visit
Admission: RE | Admit: 2019-06-10 | Discharge: 2019-06-10 | Disposition: A | Discharge status: Home / Self Care | Payer: No Typology Code available for payment source | Source: Ambulatory Visit | Attending: Student in an Organized Health Care Education/Training Program | Admitting: Student in an Organized Health Care Education/Training Program

## 2019-06-10 ENCOUNTER — Encounter: Payer: Self-pay | Admitting: Student in an Organized Health Care Education/Training Program

## 2019-06-10 ENCOUNTER — Ambulatory Visit (HOSPITAL_BASED_OUTPATIENT_CLINIC_OR_DEPARTMENT_OTHER)
Payer: No Typology Code available for payment source | Admitting: Student in an Organized Health Care Education/Training Program

## 2019-06-10 ENCOUNTER — Other Ambulatory Visit: Payer: Self-pay

## 2019-06-10 VITALS — BP 100/73 | HR 77 | Temp 97.7°F | Resp 16 | Ht 70.0 in | Wt 235.0 lb

## 2019-06-10 DIAGNOSIS — M47816 Spondylosis without myelopathy or radiculopathy, lumbar region: Secondary | ICD-10-CM

## 2019-06-10 MED ORDER — ONDANSETRON HCL 4 MG/2ML IJ SOLN
INTRAMUSCULAR | Status: AC
  Filled 2019-06-10: qty 2

## 2019-06-10 MED ORDER — LIDOCAINE HCL 2 % IJ SOLN
20.0000 mL | Freq: Once | INTRAMUSCULAR | Status: AC
  Administered 2019-06-10: 400 mg

## 2019-06-10 MED ORDER — ROPIVACAINE HCL 2 MG/ML IJ SOLN
1.0000 mL | Freq: Once | INTRAMUSCULAR | Status: AC
  Administered 2019-06-10: 10 mL via EPIDURAL

## 2019-06-10 MED ORDER — MIDAZOLAM HCL 5 MG/5ML IJ SOLN
1.0000 mg | INTRAMUSCULAR | Status: DC | PRN
  Administered 2019-06-10: 1 mg via INTRAVENOUS
  Filled 2019-06-10: qty 5

## 2019-06-10 MED ORDER — FENTANYL CITRATE (PF) 100 MCG/2ML IJ SOLN
25.0000 ug | INTRAMUSCULAR | Status: DC | PRN
  Filled 2019-06-10: qty 2

## 2019-06-10 MED ORDER — DEXAMETHASONE SODIUM PHOSPHATE 10 MG/ML IJ SOLN
10.0000 mg | Freq: Once | INTRAMUSCULAR | Status: AC
  Administered 2019-06-10: 09:00:00 10 mg
  Filled 2019-06-10: qty 1

## 2019-06-10 MED ORDER — ONDANSETRON HCL 4 MG/2ML IJ SOLN
4.0000 mg | Freq: Once | INTRAMUSCULAR | Status: AC
  Administered 2019-06-10: 4 mg via INTRAVENOUS
  Filled 2019-06-10: qty 2

## 2019-06-10 MED ORDER — LIDOCAINE HCL 2 % IJ SOLN
INTRAMUSCULAR | Status: AC
  Filled 2019-06-10: qty 20

## 2019-06-10 MED ORDER — IOHEXOL 180 MG/ML  SOLN: 10.0000 mL | Freq: Once | INTRAMUSCULAR | Status: DC

## 2019-06-10 MED ORDER — ROPIVACAINE HCL 2 MG/ML IJ SOLN
INTRAMUSCULAR | Status: AC
  Filled 2019-06-10: qty 10

## 2019-06-10 NOTE — Progress Notes (Signed)
Safety precautions to be maintained throughout the outpatient stay will include: orient to surroundings, keep bed in low position, maintain call bell within reach at all times, provide assistance with transfer out of bed and ambulation.  

## 2019-06-10 NOTE — Patient Instructions (Addendum)
You may restart Xarelto tomorrow as long as you are not having any new weakness in your legs.  ____________________________________________________________________________________________  Post-procedure Information What to expect: Most procedures involve the use of a local anesthetic (numbing medicine), and a steroid (anti-inflammatory medicine).  The local anesthetics may cause temporary numbness and weakness of the legs or arms, depending on the location of the block. This numbness/weakness may last 4-6 hours, depending on the local anesthetic used. In rare instances, it can last up to 24 hours. While numb, you must be very careful not to injure the extremity.  After any procedure, you could expect the pain to get better within 15-20 minutes. This relief is temporary and may last 4-6 hours. Once the local anesthetics wears off, you could experience discomfort, possibly more than usual, for up to 10 (ten) days. In the case of radiofrequencies, it may last up to 6 weeks. Surgeries may take up to 8 weeks for the healing process. The discomfort is due to the irritation caused by needles going through skin and muscle. To minimize the discomfort, we recommend using ice the first day, and heat from then on. The ice should be applied for 15 minutes on, and 15 minutes off. Keep repeating this cycle until bedtime. Avoid applying the ice directly to the skin, to prevent frostbite. Heat should be used daily, until the pain improves (4-10 days). Be careful not to burn yourself.  Occasionally you may experience muscle spasms or cramps. These occur as a consequence of the irritation caused by the needle sticks to the muscle and the blood that will inevitably be lost into the surrounding muscle tissue. Blood tends to be very irritating to tissues, which tend to react by going into spasm. These spasms may start the same day of your procedure, but they may also take days to develop. This late onset type of spasm or  cramp is usually caused by electrolyte imbalances triggered by the steroids, at the level of the kidney. Cramps and spasms tend to respond well to muscle relaxants, multivitamins (some are triggered by the procedure, but may have their origins in vitamin deficiencies), and "Gatorade", or any sports drinks that can replenish any electrolyte imbalances. (If you are a diabetic, ask your pharmacist to get you a sugar-free brand.) Warm showers or baths may also be helpful. Stretching exercises are highly recommended.  General Instructions:  Be alert for signs of possible infection: redness, swelling, heat, red streaks, elevated temperature, and/or fever. These typically appear 4 to 6 days after the procedure. Immediately notify your doctor if you experience unusual bleeding, difficulty breathing, or loss of bowel or bladder control. If you experience increased pain, do not increase your pain medicine intake, unless instructed by your pain physician.  Post-Procedure Care:  Be careful in moving about. Muscle spasms in the area of the injection may occur. Applying ice or heat to the area is often helpful. The incidence of spinal headaches after epidural injections ranges between 1.4% and 6%. If you develop a headache that does not seem to respond to conservative therapy, please let your physician know. This can be treated with an epidural blood patch.   Post-procedure numbness or redness is to be expected, however it should average 4 to 6 hours. If numbness and weakness of your extremities begins to develop 4 to 6 hours after your procedure, and is felt to be progressing and worsening, immediately contact your physician.  Diet:  If you experience nausea, do not eat until this sensation  goes away. If you had a "Stellate Ganglion Block" for upper extremity "Reflex Sympathetic Dystrophy", do not eat or drink until your hoarseness goes away. In any case, always start with liquids first and if you tolerate them well,  then slowly progress to more solid foods.  Activity:  For the first 4 to 6 hours after the procedure, use caution in moving about as you may experience numbness and/or weakness. Use caution in cooking, using household electrical appliances, and climbing steps. If you need to reach your Doctor call our office: [phone] (During business hours) or [phone] (After business hours).  Business Hours: Monday-Thursday 8:00 am - 4:00 PM    Fridays: Closed     In case of an emergency: In case of emergency, call 911 or go to the nearest emergency room and have the physician there call us.  Interpretation of Procedure Every nerve block has two components: a diagnostic component, and a treatment component. Unrealistic expectations are the most common causes of "perceived failure".  In a perfect world, a single nerve block should be able to completely and permanently eliminate the pain. Sadly, the world is not perfect.  Most pain management nerve blocks are performed using local anesthetics and steroids. Steroids are responsible for any long-term benefit that you may experience. Their purpose is to decrease any chronic swelling that may exist in the area. Steroids begin to work immediately after being injected. However, most patients will not experience any benefits until 5 to 10 days after the injection, when the swelling has come down to the point where they can tell a difference. Steroids will only help if there is swelling to be treated. As such, they can assist with the diagnosis. If effective, they suggest an inflammatory component to the pain, and if ineffective, they rule out inflammation as the main cause or component of the problem. If the problem is one of mechanical compression, you will get no benefit from those steroids.   In the case of local anesthetics, they have a crucial role in the diagnosis of your condition. Most will begin to work within15 to 20 minutes after injection. The  duration will depend on the type used (short- vs. Long-acting). It is of outmost importance that patients keep tract of their pain, after the procedure. To assist with this matter, a "Post-procedure Pain Diary" is provided. Make sure to complete it and to bring it back to your follow-up appointment.  As long as the patient keeps accurate, detailed records of their symptoms after every procedure, and returns to have those interpreted, every procedure will provide [place] with invaluable information. Even a block that does not provide the patient with any relief, will always provide [place] with information about the mechanism and the origin of the pain. The only time a nerve block can be considered a waste of time is when patients do not keep track of the results, or do not keep their post-procedure appointment.  Reporting the results back to your physician The Pain Score  Pain is a subjective complaint. It cannot be seen, touched, or measured. We depend entirely on the patient's report of the pain in order to assess your condition and treatment. To evaluate the pain, we use a pain scale, where "0" means "No Pain", and a "10" is "the worst possible pain that you can even imagine" (i.e. something like been eaten alive by a shark or being torn apart by a lion).   Use the Pain Scale provided. You will  frequently be asked to rate your pain. Please be accurate, remember that medical decisions will be based on your responses. Please do not rate your pain above a 10. Doing so is actually interpreted as "symptom magnification" (exaggeration). To put this into perspective, when you tell us that your pain is at a 10 (ten), what you are saying is that there is nothing we can do to make this pain any worse. (Carefully think about that.) ____________________________________________________________________________________________ May start Xarelto back tomorrow as long as there is no weakness in your legs. Post-procedure  Information What to expect: Most procedures involve the use of a local anesthetic (numbing medicine), and a steroid (anti-inflammatory medicine).  The local anesthetics may cause temporary numbness and weakness of the legs or arms, depending on the location of the block. This numbness/weakness may last 4-6 hours, depending on the local anesthetic used. In rare instances, it can last up to 24 hours. While numb, you must be very careful not to injure the extremity.  After any procedure, you could expect the pain to get better within 15-20 minutes. This relief is temporary and may last 4-6 hours. Once the local anesthetics wears off, you could experience discomfort, possibly more than usual, for up to 10 (ten) days. In the case of radiofrequencies, it may last up to 6 weeks. Surgeries may take up to 8 weeks for the healing process. The discomfort is due to the irritation caused by needles going through skin and muscle. To minimize the discomfort, we recommend using ice the first day, and heat from then on. The ice should be applied for 15 minutes on, and 15 minutes off. Keep repeating this cycle until bedtime. Avoid applying the ice directly to the skin, to prevent frostbite. Heat should be used daily, until the pain improves (4-10 days). Be careful not to burn yourself.  Occasionally you may experience muscle spasms or cramps. These occur as a consequence of the irritation caused by the needle sticks to the muscle and the blood that will inevitably be lost into the surrounding muscle tissue. Blood tends to be very irritating to tissues, which tend to react by going into spasm. These spasms may start the same day of your procedure, but they may also take days to develop. This late onset type of spasm or cramp is usually caused by electrolyte imbalances triggered by the steroids, at the level of the kidney. Cramps and spasms tend to respond well to muscle relaxants, multivitamins (some are triggered by the  procedure, but may have their origins in vitamin deficiencies), and "Gatorade", or any sports drinks that can replenish any electrolyte imbalances. (If you are a diabetic, ask your pharmacist to get you a sugar-free brand.) Warm showers or baths may also be helpful. Stretching exercises are highly recommended. General Instructions:  Be alert for signs of possible infection: redness, swelling, heat, red streaks, elevated temperature, and/or fever. These typically appear 4 to 6 days after the procedure. Immediately notify your doctor if you experience unusual bleeding, difficulty breathing, or loss of bowel or bladder control. If you experience increased pain, do not increase your pain medicine intake, unless instructed by your pain physician. Post-Procedure Care:  Be careful in moving about. Muscle spasms in the area of the injection may occur. Applying ice or heat to the area is often helpful. The incidence of spinal headaches after epidural injections ranges between 1.4% and 6%. If you develop a headache that does not seem to respond to conservative therapy, please let your  physician know. This can be treated with an epidural blood patch.   Post-procedure numbness or redness is to be expected, however it should average 4 to 6 hours. If numbness and weakness of your extremities begins to develop 4 to 6 hours after your procedure, and is felt to be progressing and worsening, immediately contact your physician.   Diet:  If you experience nausea, do not eat until this sensation goes away. If you had a "Stellate Ganglion Block" for upper extremity "Reflex Sympathetic Dystrophy", do not eat or drink until your hoarseness goes away. In any case, always start with liquids first and if you tolerate them well, then slowly progress to more solid foods. Activity:  For the first 4 to 6 hours after the procedure, use caution in moving about as you may experience numbness and/or weakness. Use caution in cooking, using  household electrical appliances, and climbing steps. If you need to reach your Doctor call our office: [phone]) [phone] Monday-Thursday 8:00 am - 4:00 PM    Fridays: Closed     In case of an emergency: In case of emergency, call 911 or go to the nearest emergency room and have the physician there call us.  Interpretation of Procedure Every nerve block has two components: a diagnostic component, and a treatment component. Unrealistic expectations are the most common causes of "perceived failure".  In a perfect world, a single nerve block should be able to completely and permanently eliminate the pain. Sadly, the world is not perfect.  Most pain management nerve blocks are performed using local anesthetics and steroids. Steroids are responsible for any long-term benefit that you may experience. Their purpose is to decrease any chronic swelling that may exist in the area. Steroids begin to work immediately after being injected. However, most patients will not experience any benefits until 5 to 10 days after the injection, when the swelling has come down to the point where they can tell a difference. Steroids will only help if there is swelling to be treated. As such, they can assist with the diagnosis. If effective, they suggest an inflammatory component to the pain, and if ineffective, they rule out inflammation as the main cause or component of the problem. If the problem is one of mechanical compression, you will get no benefit from those steroids.   In the case of local anesthetics, they have a crucial role in the diagnosis of your condition. Most will begin to work within15 to 20 minutes after injection. The duration will depend on the type used (short- vs. Long-acting). It is of outmost importance that patients keep tract of their pain, after the procedure. To assist with this matter, a "Post-procedure Pain Diary" is provided. Make sure to complete it and to bring it back to your follow-up  appointment.  As long as the patient keeps accurate, detailed records of their symptoms after every procedure, and returns to have those interpreted, every procedure will provide [place] with invaluable information. Even a block that does not provide the patient with any relief, will always provide [place] with information about the mechanism and the origin of the pain. The only time a nerve block can be considered a waste of time is when patients do not keep track of the results, or do not keep their post-procedure appointment.  Reporting the results back to your physician The Pain Score  Pain is a subjective complaint. It cannot be seen, touched, or measured. We depend entirely on the patient's report of the pain in  order to assess your condition and treatment. To evaluate the pain, we use a pain scale, where "0" means "No Pain", and a "10" is "the worst possible pain that you can even imagine" (i.e. something like been eaten alive by a shark or being torn apart by a lion).   You will frequently be asked to rate your pain. Please be as accurate, remember that medical decisions will be based on your responses. Please do not rate your pain above a 10. Doing so is actually interpreted as "symptom magnification" (exaggeration), as well as lack of understanding with regards to the scale. To put this into perspective, when you tell us that your pain is at a 10 (ten), what you are saying is that there is nothing we can do to make this pain any worse. (Carefully think about that.)

## 2019-06-10 NOTE — Progress Notes (Signed)
Patient's Name: [name]  MRN: [id number]  Referring Provider: Adin Hector, MD  DOB: March 25, 1966  PCP: Adin Hector, MD  DOS: 06/10/2019  Note by: Gillis Santa, MD  Service setting: Ambulatory outpatient  Specialty: Interventional Pain Management  Patient type: Established  Location: [facility] (AMB) Pain Management Facility  Visit type: Interventional Procedure   Primary Reason for Visit: Interventional Pain Management Treatment. CC: Back Pain (low and left)  Procedure:          Anesthesia, Analgesia, Anxiolysis:  Type: Thermal Lumbar Facet, Medial Branch Radiofrequency Ablation/Neurotomy  #2 (previously done 02/23/2019 ) Primary Purpose: Therapeutic Region: Posterolateral Lumbosacral Spine Level:  L3, L4, L5, Medial Branch Level(s). These levels will denervate the L3-4, L4-5,lumbar facet joints. Laterality: Left  Type: Moderate (Conscious) Sedation combined with Local Anesthesia Indication(s): Analgesia and Anxiety Route: Intravenous (IV) IV Access: Secured Sedation: Meaningful verbal contact was maintained at all times during the procedure  Local Anesthetic: Lidocaine 1-2%  Position: Prone   Indications: 1. Lumbar spondylosis    [name] has been dealing with the above chronic pain for longer than three months and has either failed to respond, was unable to tolerate, or simply did not get enough benefit from other more conservative therapies including, but not limited to: 1. Over-the-counter medications 2. Anti-inflammatory medications 3. Muscle relaxants 4. Membrane stabilizers 5. Opioids 6. Physical therapy and/or chiropractic manipulation 7. Modalities (Heat, ice, etc.) 8. Invasive techniques such as nerve blocks. [name] has attained more than 50% relief of the pain from a series of diagnostic injections conducted in separate occasions.  Pain Score: Pre-procedure: 5 /10 Post-procedure: 3 /10  Stopped Xarelto 3 days ago, last dose Sat.  Pre-op  Assessment:  [name] is a 53 y.o. (year old), male patient, seen today for interventional treatment. He  has a past surgical history that includes Cardioversion (1989); Cardiac catheterization (03/2010); and Total knee arthroplasty (Left). [name] has a current medication list which includes the following prescription(s): allopurinol, atorvastatin, carvedilol, cholecalciferol, docusate sodium, fluoxetine, glucosamine-chondroitin, hydrocodone-acetaminophen, ibuprofen, levothyroxine, magnesium oxide, meclizine, melatonin, multivitamin, rivaroxaban, sacubitril-valsartan, and vitamin c, and the following Facility-Administered Medications: fentanyl, iohexol, and midazolam. His primarily concern today is the Back Pain (low and left)  Initial Vital Signs:  Pulse/HCG Rate: 77ECG Heart Rate: 60 Temp: 98.1 F (36.7 C) Resp: 18 BP: 109/76 SpO2: 98 %  BMI: Estimated body mass index is 33.72 kg/m as calculated from the following:   Height as of this encounter: 5\' 10"  (1.778 m).   Weight as of this encounter: 235 lb (106.6 kg).  Risk Assessment: Allergies: Reviewed. He has No Known Allergies.  Allergy Precautions: None required Coagulopathies: Reviewed. None identified.  Blood-thinner therapy: None at this time Active Infection(s): Reviewed. None identified. [name] is afebrile  Site Confirmation: [name] was asked to confirm the procedure and laterality before marking the site Procedure checklist: Completed Consent: Before the procedure and under the influence of no sedative(s), amnesic(s), or anxiolytics, the patient was informed of the treatment options, risks and possible complications. To fulfill our ethical and legal obligations, as recommended by the American Medical Association's Code of Ethics, I have informed the patient of my clinical impression; the nature and purpose of the treatment or procedure; the risks, benefits, and possible complications of the intervention; the  alternatives, including doing nothing; the risk(s) and benefit(s) of the alternative treatment(s) or procedure(s); and the risk(s) and benefit(s) of doing nothing. The patient was provided information about the  general risks and possible complications associated with the procedure. These may include, but are not limited to: failure to achieve desired goals, infection, bleeding, organ or nerve damage, allergic reactions, paralysis, and death. In addition, the patient was informed of those risks and complications associated to Spine-related procedures, such as failure to decrease pain; infection (i.e.: Meningitis, epidural or intraspinal abscess); bleeding (i.e.: epidural hematoma, subarachnoid hemorrhage, or any other type of intraspinal or peri-dural bleeding); organ or nerve damage (i.e.: Any type of peripheral nerve, nerve root, or spinal cord injury) with subsequent damage to sensory, motor, and/or autonomic systems, resulting in permanent pain, numbness, and/or weakness of one or several areas of the body; allergic reactions; (i.e.: anaphylactic reaction); and/or death. Furthermore, the patient was informed of those risks and complications associated with the medications. These include, but are not limited to: allergic reactions (i.e.: anaphylactic or anaphylactoid reaction(s)); adrenal axis suppression; blood sugar elevation that in diabetics may result in ketoacidosis or comma; water retention that in patients with history of congestive heart failure may result in shortness of breath, pulmonary edema, and decompensation with resultant heart failure; weight gain; swelling or edema; medication-induced neural toxicity; particulate matter embolism and blood vessel occlusion with resultant organ, and/or nervous system infarction; and/or aseptic necrosis of one or more joints. Finally, the patient was informed that Medicine is not an exact science; therefore, there is also the possibility of unforeseen or  unpredictable risks and/or possible complications that may result in a catastrophic outcome. The patient indicated having understood very clearly. We have given the patient no guarantees and we have made no promises. Enough time was given to the patient to ask questions, all of which were answered to the patient's satisfaction. [name] has indicated that he wanted to continue with the procedure. Attestation: I, the ordering provider, attest that I have discussed with the patient the benefits, risks, side-effects, alternatives, likelihood of achieving goals, and potential problems during recovery for the procedure that I have provided informed consent. Date  Time: 06/10/2019  7:52 AM  Pre-Procedure Preparation:  Monitoring: As per clinic protocol. Respiration, ETCO2, SpO2, BP, heart rate and rhythm monitor placed and checked for adequate function Safety Precautions: Patient was assessed for positional comfort and pressure points before starting the procedure. Time-out: I initiated and conducted the "Time-out" before starting the procedure, as per protocol. The patient was asked to participate by confirming the accuracy of the "Time Out" information. Verification of the correct person, site, and procedure were performed and confirmed by me, the nursing staff, and the patient. "Time-out" conducted as per Joint Commission's Universal Protocol (UP.01.01.01). Time: 0902  Description of Procedure:          Laterality: Left Levels: L3, L4, L5,Medial Branch Level(s), at the L3-4, L4-5,  lumbar facet joints. Area Prepped: Lumbosacral Prepping solution: ChloraPrep (2% chlorhexidine gluconate and 70% isopropyl alcohol) Safety Precautions: Aspiration looking for blood return was conducted prior to all injections. At no point did we inject any substances, as a needle was being advanced. Before injecting, the patient was told to immediately notify me if he was experiencing any new onset of "ringing in the ears,  or metallic taste in the mouth". No attempts were made at seeking any paresthesias. Safe injection practices and needle disposal techniques used. Medications properly checked for expiration dates. SDV (single dose vial) medications used. After the completion of the procedure, all disposable equipment used was discarded in the proper designated medical waste containers. Local Anesthesia: Protocol guidelines were followed. The  patient was positioned over the fluoroscopy table. The area was prepped in the usual manner. The time-out was completed. The target area was identified using fluoroscopy. A 12-in long, straight, sterile hemostat was used with fluoroscopic guidance to locate the targets for each level blocked. Once located, the skin was marked with an approved surgical skin marker. Once all sites were marked, the skin (epidermis, dermis, and hypodermis), as well as deeper tissues (fat, connective tissue and muscle) were infiltrated with a small amount of a short-acting local anesthetic, loaded on a 10cc syringe with a 25G, 1.5-in  Needle. An appropriate amount of time was allowed for local anesthetics to take effect before proceeding to the next step. Local Anesthetic: Lidocaine 2.0% The unused portion of the local anesthetic was discarded in the proper designated containers. Technical explanation of process:  Radiofrequency Ablation (RFA)  L3 Medial Branch Nerve RFA: The target area for the L3 medial branch is at the junction of the postero-lateral aspect of the superior articular process and the superior, posterior, and medial edge of the transverse process of L4. Under fluoroscopic guidance, a Radiofrequency needle was inserted until contact was made with os over the superior postero-lateral aspect of the pedicular shadow (target area). Sensory and motor testing was conducted to properly adjust the position of the needle. Once satisfactory placement of the needle was achieved, the numbing solution was  slowly injected after negative aspiration for blood. 24mL of the nerve block solution was injected without difficulty or complication. After waiting for at least 3 minutes, the ablation was performed. Once completed, the needle was removed intact. L4 Medial Branch Nerve RFA: The target area for the L4 medial branch is at the junction of the postero-lateral aspect of the superior articular process and the superior, posterior, and medial edge of the transverse process of L5. Under fluoroscopic guidance, a Radiofrequency needle was inserted until contact was made with os over the superior postero-lateral aspect of the pedicular shadow (target area). Sensory and motor testing was conducted to properly adjust the position of the needle. Once satisfactory placement of the needle was achieved, the numbing solution was slowly injected after negative aspiration for blood. 18mL of the nerve block solution was injected without difficulty or complication. After waiting for at least 3 minutes, the ablation was performed. Once completed, the needle was removed intact. L5 Medial Branch Nerve RFA: The target area for the L5 medial branch is at the junction of the postero-lateral aspect of the superior articular process of S1 and the superior, posterior, and medial edge of the sacral ala. Under fluoroscopic guidance, a Radiofrequency needle was inserted until contact was made with os over the superior postero-lateral aspect of the pedicular shadow (target area). Sensory and motor testing was conducted to properly adjust the position of the needle. Once satisfactory placement of the needle was achieved, the numbing solution was slowly injected after negative aspiration for blood.2 mL of the nerve block solution was injected without difficulty or complication. After waiting for at least 3 minutes, the ablation was performed. Once completed, the needle was removed intact.   Radiofrequency lesioning (ablation):  Radiofrequency  Generator: NeuroTherm [id number] Sensory Stimulation Parameters: 50 Hz was used to locate & identify the nerve, making sure that the needle was positioned such that there was no sensory stimulation below 0.3 V or above 0.7 V. Motor Stimulation Parameters: 2 Hz was used to evaluate the motor component. Care was taken not to lesion any nerves that demonstrated motor stimulation of the lower  extremities at an output of less than 2.5 times that of the sensory threshold, or a maximum of 2.0 V. Lesioning Technique Parameters: Standard Radiofrequency settings. (Not bipolar or pulsed.) Temperature Settings: 80 degrees C Lesioning time: 60 seconds Intra-operative Compliance: Compliant Materials & Medications: Needle(s) (Electrode/Cannula) Type: Teflon-coated, curved tip, Radiofrequency needle(s) Gauge: 22G Length: 10cm Numbing solution: 6 cc solution made of 5 cc of 0.2% ropivacaine, 1 cc of Decadron 10 mg/cc.  2 cc injected at each level above on the right prior to ablation.  The unused portion of the solution was discarded in the proper designated containers.  Once the entire procedure was completed, the treated area was cleaned, making sure to leave some of the prepping solution back to take advantage of its long term bactericidal properties.  Illustration of the posterior view of the lumbar spine and the posterior neural structures. Laminae of L2 through S1 are labeled. DPRL5, dorsal primary ramus of L5; DPRS1, dorsal primary ramus of S1; DPR3, dorsal primary ramus of L3; FJ, facet (zygapophyseal) joint L3-L4; I, inferior articular process of L4; LB1, lateral branch of dorsal primary ramus of L1; IAB, inferior articular branches from L3 medial branch (supplies L4-L5 facet joint); IBP, intermediate branch plexus; MB3, medial branch of dorsal primary ramus of L3; NR3, third lumbar nerve root; S, superior articular process of L5; SAB, superior articular branches from L4 (supplies L4-5 facet joint also); TP3,  transverse process of L3.  Vitals:   06/10/19 0906 06/10/19 0911 06/10/19 0916 06/10/19 0921  BP: 115/80 119/74 117/75 114/83  Pulse:      Resp: 12 13 11 12   Temp:      TempSrc:      SpO2: 96% 95% 95% 96%  Weight:      Height:        Start Time: 0902 hrs. End Time: 0919 hrs.  Imaging Guidance (Spinal):          Type of Imaging Technique: Fluoroscopy Guidance (Spinal) Indication(s): Assistance in needle guidance and placement for procedures requiring needle placement in or near specific anatomical locations not easily accessible without such assistance. Exposure Time: Please see nurses notes. Contrast: None used. Fluoroscopic Guidance: I was personally present during the use of fluoroscopy. "Tunnel Vision Technique" used to obtain the best possible view of the target area. Parallax error corrected before commencing the procedure. "Direction-depth-direction" technique used to introduce the needle under continuous pulsed fluoroscopy. Once target was reached, antero-posterior, oblique, and lateral fluoroscopic projection used confirm needle placement in all planes. Images permanently stored in EMR. Interpretation: No contrast injected. I personally interpreted the imaging intraoperatively. Adequate needle placement confirmed in multiple planes. Permanent images saved into the patient's record.  Antibiotic Prophylaxis:   Anti-infectives (From admission, onward)   None     Indication(s): None identified  Post-operative Assessment:  Post-procedure Vital Signs:  Pulse/HCG Rate: 7769 Temp: 98.1 F (36.7 C) Resp: 12 BP: 114/83 SpO2: 96 %  EBL: None  Complications: No immediate post-treatment complications observed by team, or reported by patient.  Note: The patient tolerated the entire procedure well. A repeat set of vitals were taken after the procedure and the patient was kept under observation following institutional policy, for this type of procedure. Post-procedural  neurological assessment was performed, showing return to baseline, prior to discharge. The patient was provided with post-procedure discharge instructions, including a section on how to identify potential problems. Should any problems arise concerning this procedure, the patient was given instructions to immediately contact us, at any  time, without hesitation. In any case, we plan to contact the patient by telephone for a follow-up status report regarding this interventional procedure.  Comments:  No additional relevant information.  Plan of Care  Orders:  Orders Placed This Encounter  Procedures  . DG PAIN [facility]IN[facility] C-ARM 1-60 MIN NO REPORT    Intraoperative interpretation by procedural physician at [place].    Standing Status:   Standing    Number of Occurrences:   1    Order Specific Question:   Reason for exam:    Answer:   Assistance in needle guidance and placement for procedures requiring needle placement in or near specific anatomical locations not easily accessible without such assistance.   Patient instructed to restart Xarelto tomorrow so long as he is not having any LE weakness.    Medications ordered for procedure: Meds ordered this encounter  Medications  . iohexol (OMNIPAQUE) 180 MG/ML injection 10 mL    Must be Myelogram-compatible. If not available, you may substitute with a water-soluble, non-ionic, hypoallergenic, myelogram-compatible radiological contrast medium.  Marland Kitchen lidocaine (XYLOCAINE) 2 % (with pres) injection 400 mg  . fentaNYL (SUBLIMAZE) injection 25 mcg    Make sure Narcan is available in the pyxis when using this medication. In the event of respiratory depression (RR< 8/min): Titrate NARCAN (naloxone) in increments of 0.1 to 0.2 mg IV at 2-3 minute intervals, until desired degree of reversal.  . ropivacaine (PF) 2 mg/mL (0.2%) (NAROPIN) injection 1 mL  . dexamethasone (DECADRON) injection 10 mg  . ondansetron (ZOFRAN) injection 4 mg  . midazolam  (VERSED) 5 MG/5ML injection 1 mg    Make sure Flumazenil is available in the pyxis when using this medication. If oversedation occurs, administer 0.2 mg IV over 15 sec. If after 45 sec no response, administer 0.2 mg again over 1 min; may repeat at 1 min intervals; not to exceed 4 doses (1 mg)   Medications administered: We administered lidocaine, ropivacaine (PF) 2 mg/mL (0.2%), dexamethasone, ondansetron, and midazolam.  See the medical record for exact dosing, route, and time of administration.  Disposition: Discharge home  Discharge Date & Time: 06/10/2019;   hrs.   Follow-up plan:   Return in about 10 weeks (around 08/19/2019) for Post Procedure Evaluation, virtual.     Future Appointments  Date Time Provider [place]  08/18/2019  2:00 PM Gillis Santa, MD [facility] None   Primary Care Physician: Adin Hector, MD Location: [facility] Note by: Gillis Santa, MD Date: 06/10/2019; Time: 9:40 AM  Disclaimer:  Medicine is not an exact science. The only guarantee in medicine is that nothing is guaranteed. It is important to note that the decision to proceed with this intervention was based on the information collected from the patient. The Data and conclusions were drawn from the patient's questionnaire, the interview, and the physical examination. Because the information was provided in large part by the patient, it cannot be guaranteed that it has not been purposely or unconsciously manipulated. Every effort has been made to obtain as much relevant data as possible for this evaluation. It is important to note that the conclusions that lead to this procedure are derived in large part from the available data. Always take into account that the treatment will also be dependent on availability of resources and existing treatment guidelines, considered by other Pain Management Practitioners as being common knowledge and practice, at the time of the  intervention. For Medico-Legal purposes, it is  also important to point out that variation in procedural techniques and pharmacological choices are the acceptable norm. The indications, contraindications, technique, and results of the above procedure should only be interpreted and judged by a Board-Certified Interventional Pain Specialist with extensive familiarity and expertise in the same exact procedure and technique.

## 2019-06-11 ENCOUNTER — Telehealth: Payer: Self-pay | Admitting: *Deleted

## 2019-06-11 NOTE — Telephone Encounter (Signed)
Left voicemail with patient re; procedure on yesterday to call if there are any questions or concerns.

## 2019-06-11 NOTE — Telephone Encounter (Signed)
No problems post procedure. 

## 2019-07-06 DIAGNOSIS — I480 Paroxysmal atrial fibrillation: Secondary | ICD-10-CM | POA: Diagnosis not present

## 2019-07-06 DIAGNOSIS — Z125 Encounter for screening for malignant neoplasm of prostate: Secondary | ICD-10-CM | POA: Diagnosis not present

## 2019-07-06 DIAGNOSIS — I10 Essential (primary) hypertension: Secondary | ICD-10-CM | POA: Diagnosis not present

## 2019-07-06 DIAGNOSIS — M1A00X0 Idiopathic chronic gout, unspecified site, without tophus (tophi): Secondary | ICD-10-CM | POA: Diagnosis not present

## 2019-07-06 DIAGNOSIS — E7849 Other hyperlipidemia: Secondary | ICD-10-CM | POA: Diagnosis not present

## 2019-07-10 DIAGNOSIS — I10 Essential (primary) hypertension: Secondary | ICD-10-CM | POA: Diagnosis not present

## 2019-07-10 DIAGNOSIS — I480 Paroxysmal atrial fibrillation: Secondary | ICD-10-CM | POA: Diagnosis not present

## 2019-07-10 DIAGNOSIS — Z0000 Encounter for general adult medical examination without abnormal findings: Secondary | ICD-10-CM | POA: Diagnosis not present

## 2019-07-10 DIAGNOSIS — I428 Other cardiomyopathies: Secondary | ICD-10-CM | POA: Diagnosis not present

## 2019-07-13 ENCOUNTER — Encounter: Payer: Self-pay | Admitting: Student in an Organized Health Care Education/Training Program

## 2019-07-13 ENCOUNTER — Encounter: Payer: Self-pay | Admitting: *Deleted

## 2019-07-13 ENCOUNTER — Telehealth: Payer: Self-pay | Admitting: *Deleted

## 2019-07-14 DIAGNOSIS — E039 Hypothyroidism, unspecified: Secondary | ICD-10-CM | POA: Diagnosis not present

## 2019-07-14 DIAGNOSIS — Z125 Encounter for screening for malignant neoplasm of prostate: Secondary | ICD-10-CM | POA: Diagnosis not present

## 2019-07-14 DIAGNOSIS — M545 Low back pain: Secondary | ICD-10-CM | POA: Diagnosis not present

## 2019-08-17 ENCOUNTER — Encounter: Payer: Self-pay | Admitting: Student in an Organized Health Care Education/Training Program

## 2019-08-17 ENCOUNTER — Telehealth: Payer: Self-pay

## 2019-08-17 NOTE — Telephone Encounter (Signed)
LM for patient to call office

## 2019-08-18 ENCOUNTER — Ambulatory Visit
Payer: BC Managed Care – PPO | Attending: Student in an Organized Health Care Education/Training Program | Admitting: Student in an Organized Health Care Education/Training Program

## 2019-08-18 ENCOUNTER — Other Ambulatory Visit: Payer: Self-pay

## 2019-08-18 DIAGNOSIS — G894 Chronic pain syndrome: Secondary | ICD-10-CM

## 2019-08-18 DIAGNOSIS — M5416 Radiculopathy, lumbar region: Secondary | ICD-10-CM

## 2019-08-18 NOTE — Progress Notes (Signed)
Patient: [name]  Service Category: E/M  Provider: Gillis Santa, MD  DOB: 13-Apr-1966  DOS: 08/18/2019  Location: Office  MRN: [id number]  Setting: Ambulatory outpatient  Referring Provider: Adin Hector, MD  Type: Established Patient  Specialty: Interventional Pain Management  PCP: Adin Hector, MD  Location: Home  Delivery: TeleHealth     Virtual Encounter - Pain Management PROVIDER NOTE: Information contained herein reflects review and annotations entered in association with encounter. Interpretation of such information and data should be left to medically-trained personnel. Information provided to patient can be located elsewhere in the medical record under "Patient Instructions". Document created using STT-dictation technology, any transcriptional errors that may result from process are unintentional.    Contact & Pharmacy Preferred: [place]: [phone] (home) Mobile: [phone] (mobile) E-mail: [email or web address]  [id number] Lorina Rabon, [place] [address] [place] [address] Phone: [phone] Fax: [phone]  [id number] - [place], [place] - [place] [address] [place] [address] Phone: [phone] Fax: [phone]   Pre-screening  [name] offered "in-person" vs "virtual" encounter. He indicated preferring virtual for this encounter.   Reason COVID-19*  Social distancing based on CDC and AMA recommendations.   I contacted [name] on 08/18/2019 via telephone.      I clearly identified myself as Gillis Santa, MD. I verified that I was speaking with the correct person using two identifiers (Name: [name], and date of birth: 19-Apr-1966).  This visit was completed via telephone due to the restrictions of the COVID-19 pandemic. All issues as above were discussed and addressed but no physical exam  was performed. If it was felt that the patient should be evaluated in the office, they were directed there. The patient verbally consented to this visit. Patient was unable to complete an audio/visual visit due to Technical difficulties and/or Lack of internet. Due to the catastrophic nature of the COVID-19 pandemic, this visit was done through audio contact only.  Location of the patient: home address (see Epic for details)  Location of the provider: office  Consent I sought verbal advanced consent from [name] for virtual visit interactions. I informed [name] of possible security and privacy concerns, risks, and limitations associated with providing "not-in-person" medical evaluation and management services. I also informed [name] of the availability of "in-person" appointments. Finally, I informed him that there would be a charge for the virtual visit and that he could be  personally, fully or partially, financially responsible for it. [name] expressed understanding and agreed to proceed.   Historic Elements   [name] is a 54 y.o. year old, male patient evaluated today after his last encounter by our practice on 08/17/2019. [name]  has a past medical history of Anxiety, Arthritis, Atrial fibrillation ([place]) (1989), Bicuspid aortic valve, Cardiomyopathy (08/2009), Depression, GERD (gastroesophageal reflux disease), Gout, Heat stroke (1998), Hyperlipidemia, Hypertension, Seizures ([place]) (1998), and throat cancer. He also  has a past surgical history that includes Cardioversion (1989); Cardiac catheterization (03/2010); and Total knee arthroplasty (Left). [name] has a current medication list which includes the following prescription(s): allopurinol, atorvastatin, carvedilol, cholecalciferol, docusate sodium, fluoxetine, glucosamine-chondroitin, hydrocodone-acetaminophen, ibuprofen, krill oil, levothyroxine, magnesium, meclizine, melatonin, multivitamin,  rivaroxaban, sacubitril-valsartan, vitamin c, atorvastatin, levothyroxine, and magnesium oxide. He  reports that he has never smoked. He has never used smokeless tobacco. He reports current alcohol  use of about 2.0 - 3.0 standard drinks of alcohol per week. He reports that he does not use drugs. [name] has No Known Allergies.   HPI  Today, he is being contacted for a post-procedure assessment.  Patient is status post right L3, L4, L5 RFA on 05/25/2019 followed by left L3, L4, L5 RFA on 06/10/2019.  He states that the lumbar radiofrequency ablations do help decrease his low back pain and allow him to increase his functional status.  He feels that he overexerts himself after his lumbar radiofrequency ablations.  He is having more pain that is radiating into his left hip and posterior thigh region.  This could be related to lumbar radiculopathy.  Patient's SI joint x-ray was unremarkable.  He does have mild left hip osteoarthritis but I would not expect that to be causing as significant radiating pain.  Discussed diagnostic lumbar epidural steroid injection for his low back and bilateral hip pain.  Risks and benefits reviewed.  Patient would like to proceed.  Instructed patient that he must stop Xarelto 3 days prior to his scheduled procedure.  Laboratory Chemistry Profile (12 mo)  Renal: 04/29/2019: BUN 23; Creatinine, Ser 1.21  Lab Results  Component Value Date   GFR 77.83 06/26/2016   GFRAA >60 04/29/2019   GFRNONAA >60 04/29/2019   Hepatic: No results found for requested labs within last 8760 hours. Lab Results  Component Value Date   AST 34 05/09/2017   ALT 34 05/09/2017   Other: No results found for requested labs within last 8760 hours. Note: Above Lab results reviewed.  Assessment  The primary encounter diagnosis was Lumbar radiculopathy. A diagnosis of Chronic pain syndrome was also pertinent to this visit.  Plan of Care   I am having [name] maintain his multivitamin,  atorvastatin, vitamin C, Ibuprofen, HYDROcodone-acetaminophen, Melatonin, rivaroxaban, FLUoxetine, allopurinol, levothyroxine, sacubitril-valsartan, carvedilol, cholecalciferol, Docusate Sodium, magnesium oxide, glucosamine-chondroitin, meclizine, levothyroxine, KRILL OIL ULTRA STRENGTH PO, Magnesium, and atorvastatin.  Orders:  Orders Placed This Encounter  Procedures  . LESI (Schedule)    Standing Status:   Future    Standing Expiration Date:   09/18/2019    Scheduling Instructions:     Procedure: Interlaminar Lumbar Epidural Steroid injection (LESI)            Laterality: Midline     Sedation: WITHOUT     Timeframe: ASAA     Stop Xarelto 3 days prior to sch procedure    Order Specific Question:   Where will this procedure be performed?    Answer:   [facility]   Follow-up plan:   Return in about 1 week (around 08/25/2019) for Procedure L3-4 ESI (stop Xarelto 3 days prior).     Status post right L3, L4, L5 RFA on 10/20/2018, status post left L3, L4, L5 RFA on 02/23/2019.  Helpful in reducing his axial low back pain and improving his functional status.  Can repeat PRN after 6 months. Plan for diagnostic l-esi for low back + radiating hip pain, hip xray and SI joint xray largely unremarkable   Recent Visits Date Type Provider Dept  06/10/19 Procedure visit Gillis Santa, MD [facility]  05/25/19 Procedure visit Gillis Santa, MD [facility]  Showing recent visits within past 90 days and meeting all other requirements   Today's Visits Date Type Provider Dept  08/18/19 Office Visit Gillis Santa, MD [facility]  Showing today's visits and meeting all other requirements   Future Appointments  No visits were found meeting these conditions.  Showing future appointments within next 90 days and meeting all other requirements   I discussed the assessment and treatment plan with the patient. The patient was provided an opportunity to ask questions and all  were answered. The patient agreed with the plan and demonstrated an understanding of the instructions.  Patient advised to call back or seek an in-person evaluation if the symptoms or condition worsens.  Total duration of non-face-to-face encounter: 20 minutes.  Note by: Gillis Santa, MD Date: 08/18/2019; Time: 2:17 PM

## 2019-08-20 ENCOUNTER — Encounter: Payer: Self-pay | Admitting: *Deleted

## 2019-08-20 ENCOUNTER — Other Ambulatory Visit: Payer: Self-pay

## 2019-08-20 ENCOUNTER — Ambulatory Visit (INDEPENDENT_AMBULATORY_CARE_PROVIDER_SITE_OTHER): Payer: BC Managed Care – PPO | Admitting: Internal Medicine

## 2019-08-20 ENCOUNTER — Encounter: Payer: Self-pay | Admitting: Internal Medicine

## 2019-08-20 VITALS — BP 120/60 | HR 73 | Ht 71.0 in | Wt 253.0 lb

## 2019-08-20 DIAGNOSIS — R072 Precordial pain: Secondary | ICD-10-CM | POA: Diagnosis not present

## 2019-08-20 DIAGNOSIS — I493 Ventricular premature depolarization: Secondary | ICD-10-CM

## 2019-08-20 DIAGNOSIS — I480 Paroxysmal atrial fibrillation: Secondary | ICD-10-CM | POA: Diagnosis not present

## 2019-08-20 DIAGNOSIS — I428 Other cardiomyopathies: Secondary | ICD-10-CM

## 2019-08-20 DIAGNOSIS — Z01812 Encounter for preprocedural laboratory examination: Secondary | ICD-10-CM

## 2019-08-20 NOTE — Progress Notes (Signed)
Patient Care Team: Adin Hector, MD as PCP - General (Internal Medicine)   HPI  [name] is a 54 y.o. male Seen in followup for atrial fibrillation that was newly identified 2012.  He was found to have a cardiomyopathy.  Evaluation was also concerning for a bicuspid aortic valve this was subsequently clarified as being trileaflet  DATE TEST    2012    Echo   EF 30 %   ? bicuspid  10/13    Echo   EF 30-35 %   trileaflet  10/15    MRI EF 43 %   No LGE  2/18 Echo  EF 40-45%    Hx  squamous cell cancer associated with lymphadenopathy.  He underwent radiation and chemotherapy which is left him with some less energy.  Last evaluation there is no visual tumor.  Her first PET scan is due 3/21.   Complaining of chest discomfort nonexertional.  Some associated dyspnea.  Strong family history of coronary disease.  Hypercholesterolemia.  Ongoing occasional palpitations.  Date Cr Hgb TSH  10/18 1.02 13.3   2/19 0.9 11.5   12/20 1.0 12.9 5.151       Past Medical History:  Diagnosis Date  . Anxiety   . Arthritis    gout  . Atrial fibrillation ([place]) 1989  . Bicuspid aortic valve    Last echo looks trileaflet  . Cardiomyopathy 08/2009   Mildly decreased EF  . Depression   . GERD (gastroesophageal reflux disease)   . Gout   . 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 Hyperlipidemia   . Hypertension   . Seizures ([place]) 1998   with heat stroke in the [employer] -none since  . throat cancer     Past Surgical History:  Procedure Laterality Date  . CARDIAC CATHETERIZATION  03/2010   No sig coronary disease, EF ~40%  . CARDIOVERSION  1989  . TOTAL KNEE ARTHROPLASTY Left     Current Outpatient Medications  Medication Sig Dispense Refill  . allopurinol (ZYLOPRIM) 100 MG tablet Take 300 mg by mouth daily.    Marland Kitchen atorvastatin (LIPITOR) 40 MG tablet Take 40 mg by mouth daily.    . carvedilol (COREG) 25 MG tablet 12.5  mg 2 (two) times daily with a meal.     . cholecalciferol (VITAMIN D3) 25 MCG (1000 UT) tablet Take 1,000 Units by mouth 2 (two) times a day.    Mariane Baumgarten Sodium (STOOL SOFTENER) 100 MG capsule Take 100 mg by mouth 2 (two) times daily.    Marland Kitchen FLUoxetine (PROZAC) 20 MG capsule Take 1 capsule (20 mg total) by mouth daily. 90 capsule 0  . glucosamine-chondroitin 500-400 MG tablet Take 1 tablet by mouth 3 (three) times daily.    Marland Kitchen HYDROcodone-acetaminophen (NORCO/VICODIN) 5-325 MG tablet take 1 to 2 tablets by mouth every 4 hours if needed  0  . Ibuprofen 200 MG CAPS Take 800 mg by mouth 2 (two) times daily as needed.     Marland Kitchen KRILL OIL ULTRA STRENGTH PO Take 1 capsule by mouth daily.    Marland Kitchen levothyroxine (SYNTHROID) 150 MCG tablet Take 150 mcg by mouth daily before breakfast.    . Magnesium 500 MG CAPS Take 500 mg by mouth 2 (two) times daily.    . magnesium oxide (MAG-OX) 400 MG tablet Take 400 mg by mouth daily.    . meclizine (ANTIVERT) 25  MG tablet Take 1 tablet (25 mg total) by mouth 3 (three) times daily as needed for dizziness. 30 tablet 0  . Melatonin 5 MG CAPS Take 10 mg by mouth at bedtime. Takes 2 capsules po at bedtime for sleep.     . Multiple Vitamin (MULTIVITAMIN) tablet Take 1 tablet by mouth daily.    . rivaroxaban (XARELTO) 20 MG TABS tablet Take 1 tablet (20 mg total) by mouth daily. 30 tablet 5  . sacubitril-valsartan (ENTRESTO) 49-51 MG Take 1 tablet by mouth 2 (two) times daily. 60 tablet 11  . vitamin C (ASCORBIC ACID) 500 MG tablet Take 500 mg by mouth daily.      No current facility-administered medications for this visit.    No Known Allergies  Review of Systems negative except from HPI and PMH  Physical Exam BP 120/60 (BP Location: Left Arm, Patient Position: Sitting, Cuff Size: Normal)   Pulse 73   Ht 5\' 11"  (1.803 m)   Wt 253 lb (114.8 kg)   BMI 35.29 kg/m  Well developed and nourished in no acute distress HENT normal Neck supple with JVP-  flat   Clear Regular  rate and rhythm, no murmurs or gallops Abd-soft with active BS No Clubbing cyanosis edema Skin-warm and dry A & Oriented  Grossly normal sensory and motor function  ECG sinus at 73 Intervals 19/09/39 Otherwise normal 8    Assessment and  Plan  PVCs  Atrial Fibrillation   Cardiomyopathy--nonischemic  Hyperlipidemia  Chest discomfort    Patient has chest discomfort in a high risk situation with radiation therapy hypercholesterolemia and family history.  Will undertake CTA  Still with occasional palpitations.  Provoked by alcohol.  He is refraining.  Encouraged exercise and weight loss

## 2019-08-20 NOTE — Patient Instructions (Signed)
Medication Instructions:  - Your physician recommends that you continue on your current medications as directed. Please refer to the Current Medication list given to you today.  *If you need a refill on your cardiac medications before your next appointment, please call your pharmacy*  Lab Work: - Your physician recommends that you return for lab work just prior to your Cardiac CTA: BMP (wait until you get called with an appointment for the CT before you go have this done  [place] [facility], 1st desk on the right to check in Monday- Friday (7:30 am- 5:30 pm)   If you have labs (blood work) drawn today and your tests are completely normal, you will receive your results only by: Marland Kitchen MyChart Message (if you have MyChart) OR . A paper copy in the mail If you have any lab test that is abnormal or we need to change your treatment, we will call you to review the results.  Testing/Procedures: - Your physician has requested that you have cardiac CT. Cardiac computed tomography (CT) is a painless test that uses an x-ray machine to take clear, detailed pictures of your heart. Please follow instruction sheet as given.   Your cardiac CT will be scheduled at one of the below locations:   [facility] [address] [place], [place] [address] (336) [place] [address] [place], [place] [address] [phone]  If scheduled at [facility], please arrive at the [facility] main entrance of [facility] 30-45 minutes prior to test start time. Proceed to the [facility] Radiology Department (first floor) to check-in and test prep.  If scheduled at [facility], please arrive 15 mins early for check-in and test prep.  Please follow these instructions carefully (unless otherwise directed):  Hold all erectile dysfunction medications at least 3 days (72 hrs) prior to test.  On the  Night Before the Test: . Be sure to Drink plenty of water. . Do not consume any caffeinated/decaffeinated beverages or chocolate 12 hours prior to your test. . Do not take any antihistamines 12 hours prior to your test.  On the Day of the Test: . Drink plenty of water. Do not drink any water within one hour of the test. . Do not eat any food 4 hours prior to the test. . You may take your regular medications prior to the test.  . Take carvedilol (coreg) two hours prior to test. . HOLD Furosemide/Hydrochlorothiazide morning of the test       After the Test: . Drink plenty of water. . After receiving IV contrast, you may experience a mild flushed feeling. This is normal. . On occasion, you may experience a mild rash up to 24 hours after the test. This is not dangerous. If this occurs, you can take Benadryl 25 mg and increase your fluid intake. . If you experience trouble breathing, this can be serious. If it is severe call 911 IMMEDIATELY. If it is mild, please call our office. . If you take any of these medications: Glipizide/Metformin, Avandament, Glucavance, please do not take 48 hours after completing test unless otherwise instructed.   Once we have confirmed authorization from your insurance company, we will call you to set up a date and time for your test.   For non-scheduling related questions, please contact the cardiac imaging nurse navigator should you have any questions/concerns: Marchia Bond, RN Navigator Cardiac Imaging [facility] [phone] Office     Follow-Up: At [facility], you and your health needs are our priority.  As part of our continuing mission to provide you with exceptional heart care, we have created designated Provider Care Teams.  These Care Teams include your primary Cardiologist (physician) and Advanced Practice Providers (APPs -  Physician Assistants and Nurse Practitioners) who all work together to provide you with the  care you need, when you need it.  Your next appointment:   1 year(s)  The format for your next appointment:   In Person  Provider:   Virl Axe, MD  Other Instructions N/a   Cardiac CT Angiogram A cardiac CT angiogram is a procedure to look at the heart and the area around the heart. It may be done to help find the cause of chest pains or other symptoms of heart disease. During this procedure, a substance called contrast dye is injected into the blood vessels in the area to be checked. A large X-ray machine, called a CT scanner, then takes detailed pictures of the heart and the surrounding area. The procedure is also sometimes called a coronary CT angiogram, coronary artery scanning, or CTA. A cardiac CT angiogram allows the health care provider to see how well blood is flowing to and from the heart. The health care provider will be able to see if there are any problems, such as:  Blockage or narrowing of the coronary arteries in the heart.  Fluid around the heart.  Signs of weakness or disease in the muscles, valves, and tissues of the heart. Tell a health care provider about:  Any allergies you have. This is especially important if you have had a previous allergic reaction to contrast dye.  All medicines you are taking, including vitamins, herbs, eye drops, creams, and over-the-counter medicines.  Any blood disorders you have.  Any surgeries you have had.  Any medical conditions you have.  Whether you are pregnant or may be pregnant.  Any anxiety disorders, chronic pain, or other conditions you have that may increase your stress or prevent you from lying still. What are the risks? Generally, this is a safe procedure. However, problems may occur, including:  Bleeding.  Infection.  Allergic reactions to medicines or dyes.  Damage to other structures or organs.  Kidney damage from the contrast dye that is used.  Increased risk of cancer from radiation exposure.  This risk is low. Talk with your health care provider about: ? The risks and benefits of testing. ? How you can receive the lowest dose of radiation. What happens before the procedure?  Wear comfortable clothing and remove any jewelry, glasses, dentures, and hearing aids.  Follow instructions from your health care provider about eating and drinking. This may include: ? For 12 hours before the procedure -- avoid caffeine. This includes tea, coffee, soda, energy drinks, and diet pills. Drink plenty of water or other fluids that do not have caffeine in them. Being well hydrated can prevent complications. ? For 4-6 hours before the procedure -- stop eating and drinking. The contrast dye can cause nausea, but this is less likely if your stomach is empty.  Ask your health care provider about changing or stopping your regular medicines. This is especially important if you are taking diabetes medicines, blood thinners, or medicines to treat problems with erections (erectile dysfunction). What happens during the procedure?   Hair on your chest may need to be removed so that small sticky patches called electrodes  can be placed on your chest. These will transmit information that helps to monitor your heart during the procedure.  An IV will be inserted into one of your veins.  You might be given a medicine to control your heart rate during the procedure. This will help to ensure that good images are obtained.  You will be asked to lie on an exam table. This table will slide in and out of the CT machine during the procedure.  Contrast dye will be injected into the IV. You might feel warm, or you may get a metallic taste in your mouth.  You will be given a medicine called nitroglycerin. This will relax or dilate the arteries in your heart.  The table that you are lying on will move into the CT machine tunnel for the scan.  The person running the machine will give you instructions while the scans are  being done. You may be asked to: ? Keep your arms above your head. ? Hold your breath. ? Stay very still, even if the table is moving.  When the scanning is complete, you will be moved out of the machine.  The IV will be removed. The procedure may vary among health care providers and hospitals. What can I expect after the procedure? After your procedure, it is common to have:  A metallic taste in your mouth from the contrast dye.  A feeling of warmth.  A headache from the nitroglycerin. Follow these instructions at home:  Take over-the-counter and prescription medicines only as told by your health care provider.  If you are told, drink enough fluid to keep your urine pale yellow. This will help to flush the contrast dye out of your body.  Most people can return to their normal activities right after the procedure. Ask your health care provider what activities are safe for you.  It is up to you to get the results of your procedure. Ask your health care provider, or the department that is doing the procedure, when your results will be ready.  Keep all follow-up visits as told by your health care provider. This is important. Contact a health care provider if:  You have any symptoms of allergy to the contrast dye. These include: ? Shortness of breath. ? Rash or hives. ? A racing heartbeat. Summary  A cardiac CT angiogram is a procedure to look at the heart and the area around the heart. It may be done to help find the cause of chest pains or other symptoms of heart disease.  During this procedure, a large X-ray machine, called a CT scanner, takes detailed pictures of the heart and the surrounding area after a contrast dye has been injected into blood vessels in the area.  Ask your health care provider about changing or stopping your regular medicines before the procedure. This is especially important if you are taking diabetes medicines, blood thinners, or medicines to treat  erectile dysfunction.  If you are told, drink enough fluid to keep your urine pale yellow. This will help to flush the contrast dye out of your body. This information is not intended to replace advice given to you by your health care provider. Make sure you discuss any questions you have with your health care provider. Document Revised: 03/18/2019 Document Reviewed: 03/18/2019 Elsevier Patient Education  2020 [employer]. ]

## 2019-09-09 ENCOUNTER — Encounter: Payer: Self-pay | Admitting: Student in an Organized Health Care Education/Training Program

## 2019-09-09 ENCOUNTER — Telehealth: Payer: Self-pay | Admitting: *Deleted

## 2019-09-09 NOTE — Progress Notes (Signed)
Letter drafted

## 2019-09-09 NOTE — Telephone Encounter (Signed)
Requested letter dictated and printed per Dr. Holley Raring. Patient called and informed that I would mail it to him [address] [place] [phone]  Copy sent to scan.

## 2019-09-16 ENCOUNTER — Other Ambulatory Visit: Payer: Self-pay

## 2019-09-16 ENCOUNTER — Ambulatory Visit: Payer: BC Managed Care – PPO | Admitting: Student in an Organized Health Care Education/Training Program

## 2019-10-13 ENCOUNTER — Encounter (HOSPITAL_COMMUNITY): Payer: Self-pay

## 2019-10-13 ENCOUNTER — Telehealth (HOSPITAL_COMMUNITY): Payer: Self-pay | Admitting: Emergency Medicine

## 2019-10-13 ENCOUNTER — Other Ambulatory Visit
Admission: RE | Admit: 2019-10-13 | Discharge: 2019-10-13 | Disposition: A | Discharge status: Home / Self Care | Payer: No Typology Code available for payment source | Source: Ambulatory Visit | Attending: Internal Medicine | Admitting: Internal Medicine

## 2019-10-13 DIAGNOSIS — Z01812 Encounter for preprocedural laboratory examination: Secondary | ICD-10-CM | POA: Diagnosis present

## 2019-10-13 DIAGNOSIS — R072 Precordial pain: Secondary | ICD-10-CM | POA: Insufficient documentation

## 2019-10-13 LAB — BASIC METABOLIC PANEL
Anion gap: 11 (ref 5–15)
BUN: 19 mg/dL (ref 6–20)
CO2: 26 mmol/L (ref 22–32)
Calcium: 9.2 mg/dL (ref 8.9–10.3)
Chloride: 101 mmol/L (ref 98–111)
Creatinine, Ser: 1.08 mg/dL (ref 0.61–1.24)
GFR calc Af Amer: >60 mL/min (ref >60)
GFR calc non Af Amer: >60 mL/min (ref >60)
Glucose, Bld: 95 mg/dL (ref 70–99)
Potassium: 4.3 mmol/L (ref 3.5–5.1)
Sodium: 138 mmol/L (ref 135–145)

## 2019-10-13 NOTE — Telephone Encounter (Signed)
Reaching out to patient to offer assistance regarding upcoming cardiac imaging study; pt verbalizes understanding of appt date/time, parking situation and where to check in, pre-test NPO status and medications ordered, and verified current allergies; name and call back number provided for further questions should they arise Marchia Bond RN Navigator Cardiac Imaging [place] [facility] [phone] office [phone] cell  Requesting patient have labs drawn prior to appt. Pt states he is familiar with [facility] and will have drawn today.  Clarise Cruz

## 2019-10-15 ENCOUNTER — Ambulatory Visit (HOSPITAL_COMMUNITY)
Admission: RE | Admit: 2019-10-15 | Discharge: 2019-10-15 | Disposition: A | Discharge status: Home / Self Care | Payer: No Typology Code available for payment source | Source: Ambulatory Visit | Attending: Internal Medicine | Admitting: Internal Medicine

## 2019-10-15 ENCOUNTER — Other Ambulatory Visit: Payer: Self-pay

## 2019-10-15 DIAGNOSIS — R072 Precordial pain: Secondary | ICD-10-CM

## 2019-10-15 MED ORDER — NITROGLYCERIN 0.4 MG SL SUBL
SUBLINGUAL_TABLET | SUBLINGUAL | Status: AC
  Filled 2019-10-15: qty 2

## 2019-10-15 MED ORDER — IOHEXOL 350 MG/ML SOLN
80.0000 mL | Freq: Once | INTRAVENOUS | Status: AC | PRN
  Administered 2019-10-15: 80 mL via INTRAVENOUS

## 2019-10-15 MED ORDER — NITROGLYCERIN 0.4 MG SL SUBL
0.8000 mg | SUBLINGUAL_TABLET | Freq: Once | SUBLINGUAL | Status: AC
  Administered 2019-10-15: 0.8 mg via SUBLINGUAL

## 2019-10-16 ENCOUNTER — Ambulatory Visit (HOSPITAL_COMMUNITY)
Admission: RE | Admit: 2019-10-16 | Discharge: 2019-10-16 | Disposition: A | Discharge status: Home / Self Care | Payer: No Typology Code available for payment source | Source: Ambulatory Visit | Attending: Internal Medicine | Admitting: Internal Medicine

## 2019-10-16 DIAGNOSIS — R072 Precordial pain: Secondary | ICD-10-CM | POA: Diagnosis not present

## 2019-10-19 DIAGNOSIS — R072 Precordial pain: Secondary | ICD-10-CM | POA: Diagnosis not present

## 2019-10-21 ENCOUNTER — Telehealth: Payer: Self-pay

## 2019-10-21 NOTE — Telephone Encounter (Signed)
Spoke with patient regarding results.  Patient verbalizes understanding. Advised patient to call back with any issues or concerns.  

## 2019-10-21 NOTE — Telephone Encounter (Signed)
-----   Message from Deboraha Sprang, MD sent at 10/15/2019  6:08 PM EST ----- Please Inform Patient that labs are normal  Thanks

## 2019-11-02 ENCOUNTER — Encounter: Payer: Self-pay | Admitting: Student in an Organized Health Care Education/Training Program

## 2019-11-02 ENCOUNTER — Ambulatory Visit
Payer: No Typology Code available for payment source | Admitting: Student in an Organized Health Care Education/Training Program

## 2019-11-09 ENCOUNTER — Encounter: Payer: Self-pay | Admitting: Student in an Organized Health Care Education/Training Program

## 2019-11-09 ENCOUNTER — Ambulatory Visit (HOSPITAL_BASED_OUTPATIENT_CLINIC_OR_DEPARTMENT_OTHER)
Payer: No Typology Code available for payment source | Admitting: Student in an Organized Health Care Education/Training Program

## 2019-11-09 ENCOUNTER — Ambulatory Visit
Admission: RE | Admit: 2019-11-09 | Discharge: 2019-11-09 | Disposition: A | Discharge status: Home / Self Care | Payer: No Typology Code available for payment source | Source: Ambulatory Visit | Attending: Student in an Organized Health Care Education/Training Program | Admitting: Student in an Organized Health Care Education/Training Program

## 2019-11-09 ENCOUNTER — Other Ambulatory Visit: Payer: Self-pay

## 2019-11-09 VITALS — BP 126/86 | HR 66 | Temp 99.0°F | Resp 16 | Ht 71.0 in | Wt 253.0 lb

## 2019-11-09 DIAGNOSIS — M5416 Radiculopathy, lumbar region: Secondary | ICD-10-CM | POA: Insufficient documentation

## 2019-11-09 MED ORDER — ROPIVACAINE HCL 2 MG/ML IJ SOLN
INTRAMUSCULAR | Status: AC
  Filled 2019-11-09: qty 10

## 2019-11-09 MED ORDER — IOHEXOL 180 MG/ML  SOLN
10.0000 mL | Freq: Once | INTRAMUSCULAR | Status: AC
  Administered 2019-11-09: 10 mL via EPIDURAL

## 2019-11-09 MED ORDER — DEXAMETHASONE SODIUM PHOSPHATE 10 MG/ML IJ SOLN
INTRAMUSCULAR | Status: AC
  Filled 2019-11-09: qty 1

## 2019-11-09 MED ORDER — SODIUM CHLORIDE (PF) 0.9 % IJ SOLN
INTRAMUSCULAR | Status: AC
  Filled 2019-11-09: qty 10

## 2019-11-09 MED ORDER — LIDOCAINE HCL 2 % IJ SOLN
20.0000 mL | Freq: Once | INTRAMUSCULAR | Status: AC
  Administered 2019-11-09: 400 mg

## 2019-11-09 MED ORDER — ROPIVACAINE HCL 2 MG/ML IJ SOLN
2.0000 mL | Freq: Once | INTRAMUSCULAR | Status: AC
  Administered 2019-11-09: 2 mL via EPIDURAL

## 2019-11-09 MED ORDER — LIDOCAINE HCL 2 % IJ SOLN
INTRAMUSCULAR | Status: AC
  Filled 2019-11-09: qty 20

## 2019-11-09 MED ORDER — DEXAMETHASONE SODIUM PHOSPHATE 10 MG/ML IJ SOLN
10.0000 mg | Freq: Once | INTRAMUSCULAR | Status: AC
  Administered 2019-11-09: 10 mg

## 2019-11-09 MED ORDER — SODIUM CHLORIDE 0.9% FLUSH
2.0000 mL | Freq: Once | INTRAVENOUS | Status: AC
  Administered 2019-11-09: 2 mL

## 2019-11-09 NOTE — Progress Notes (Signed)
PROVIDER NOTE: Information contained herein reflects review and annotations entered in association with encounter. Interpretation of such information and data should be left to medically-trained personnel. Information provided to patient can be located elsewhere in the medical record under "Patient Instructions". Document created using STT-dictation technology, any transcriptional errors that may result from process are unintentional.    Patient: [name]  Service Category: Procedure  Provider: Gillis Santa, MD  DOB: Jun 24, 1966  DOS: 11/09/2019  Location: [facility]  MRN: [id number]  Setting: Ambulatory - outpatient  Referring Provider: Adin Hector, MD  Type: Established Patient  Specialty: Interventional Pain Management  PCP: Adin Hector, MD   Primary Reason for Visit: Interventional Pain Management Treatment. CC: Back Pain (lumbar, left is worse )  Procedure:          Anesthesia, Analgesia, Anxiolysis:  Type: Therapeutic Inter-Laminar Epidural Steroid Injection  #1  Region: Lumbar Level: L3-4 Level. Laterality: Midline         Type: Local Anesthesia  Local Anesthetic: Lidocaine 1-2%  Position: Prone with head of the table was raised to facilitate breathing.   Indications: 1. Lumbar radiculopathy    Pain Score: Pre-procedure: 7 /10 Post-procedure: 7 /10   Patient stopped  Xarelto 3 days prior to procedure.  Pre-op Assessment:  [name] is a 54 y.o. (year old), male patient, seen today for interventional treatment. He  has a past surgical history that includes Cardioversion (1989); Cardiac catheterization (03/2010); and Total knee arthroplasty (Left). [name] has a current medication list which includes the following prescription(s): allopurinol, atorvastatin, carvedilol, cholecalciferol, docusate sodium, fluoxetine, glucosamine-chondroitin, hydrocodone-acetaminophen, ibuprofen, krill oil, levothyroxine, magnesium, meclizine, melatonin,  multivitamin, rivaroxaban, sacubitril-valsartan, vitamin c, and magnesium oxide. His primarily concern today is the Back Pain (lumbar, left is worse )  Initial Vital Signs:  Pulse/HCG Rate: 66ECG Heart Rate: 61 Temp: 99 F (37.2 C) Resp: 16 BP: 118/77 SpO2: 99 %  BMI: Estimated body mass index is 35.29 kg/m as calculated from the following:   Height as of this encounter: 5\' 11"  (1.803 m).   Weight as of this encounter: 253 lb (114.8 kg).  Risk Assessment: Allergies: Reviewed. He has No Known Allergies.  Allergy Precautions: None required Coagulopathies: Reviewed. None identified.  Blood-thinner therapy: None at this time Active Infection(s): Reviewed. None identified. [name] is afebrile  Site Confirmation: [name] was asked to confirm the procedure and laterality before marking the site Procedure checklist: Completed Consent: Before the procedure and under the influence of no sedative(s), amnesic(s), or anxiolytics, the patient was informed of the treatment options, risks and possible complications. To fulfill our ethical and legal obligations, as recommended by the American Medical Association's Code of Ethics, I have informed the patient of my clinical impression; the nature and purpose of the treatment or procedure; the risks, benefits, and possible complications of the intervention; the alternatives, including doing nothing; the risk(s) and benefit(s) of the alternative treatment(s) or procedure(s); and the risk(s) and benefit(s) of doing nothing. The patient was provided information about the general risks and possible complications associated with the procedure. These may include, but are not limited to: failure to achieve desired goals, infection, bleeding, organ or nerve damage, allergic reactions, paralysis, and death. In addition, the patient was informed of those risks and complications associated to Spine-related procedures, such as failure to decrease pain; infection  (i.e.: Meningitis, epidural or intraspinal abscess); bleeding (i.e.: epidural hematoma, subarachnoid hemorrhage, or any other type of intraspinal or peri-dural  bleeding); organ or nerve damage (i.e.: Any type of peripheral nerve, nerve root, or spinal cord injury) with subsequent damage to sensory, motor, and/or autonomic systems, resulting in permanent pain, numbness, and/or weakness of one or several areas of the body; allergic reactions; (i.e.: anaphylactic reaction); and/or death. Furthermore, the patient was informed of those risks and complications associated with the medications. These include, but are not limited to: allergic reactions (i.e.: anaphylactic or anaphylactoid reaction(s)); adrenal axis suppression; blood sugar elevation that in diabetics may result in ketoacidosis or comma; water retention that in patients with history of congestive heart failure may result in shortness of breath, pulmonary edema, and decompensation with resultant heart failure; weight gain; swelling or edema; medication-induced neural toxicity; particulate matter embolism and blood vessel occlusion with resultant organ, and/or nervous system infarction; and/or aseptic necrosis of one or more joints. Finally, the patient was informed that Medicine is not an exact science; therefore, there is also the possibility of unforeseen or unpredictable risks and/or possible complications that may result in a catastrophic outcome. The patient indicated having understood very clearly. We have given the patient no guarantees and we have made no promises. Enough time was given to the patient to ask questions, all of which were answered to the patient's satisfaction. [name] has indicated that he wanted to continue with the procedure. Attestation: I, the ordering provider, attest that I have discussed with the patient the benefits, risks, side-effects, alternatives, likelihood of achieving goals, and potential problems during recovery  for the procedure that I have provided informed consent. Date  Time: 11/09/2019 10:16 AM  Pre-Procedure Preparation:  Monitoring: As per clinic protocol. Respiration, ETCO2, SpO2, BP, heart rate and rhythm monitor placed and checked for adequate function Safety Precautions: Patient was assessed for positional comfort and pressure points before starting the procedure. Time-out: I initiated and conducted the "Time-out" before starting the procedure, as per protocol. The patient was asked to participate by confirming the accuracy of the "Time Out" information. Verification of the correct person, site, and procedure were performed and confirmed by me, the nursing staff, and the patient. "Time-out" conducted as per Joint Commission's Universal Protocol (UP.01.01.01). Time: 1102  Description of Procedure:          Target Area: The interlaminar space, initially targeting the lower laminar border of the superior vertebral body. Approach: Paramedial approach. Area Prepped: Entire Posterior Lumbar Region DuraPrep (Iodine Povacrylex [0.7% available iodine] and Isopropyl Alcohol, 74% w/w) Safety Precautions: Aspiration looking for blood return was conducted prior to all injections. At no point did we inject any substances, as a needle was being advanced. No attempts were made at seeking any paresthesias. Safe injection practices and needle disposal techniques used. Medications properly checked for expiration dates. SDV (single dose vial) medications used. Description of the Procedure: Protocol guidelines were followed. The procedure needle was introduced through the skin, ipsilateral to the reported pain, and advanced to the target area. Bone was contacted and the needle walked caudad, until the lamina was cleared. The epidural space was identified using "loss-of-resistance technique" with 2-3 ml of PF-NaCl (0.9% NSS), in a 5cc LOR glass syringe.  Vitals:   11/09/19 1026 11/09/19 1101 11/09/19 1106 11/09/19  1108  BP: 118/77 128/82 118/82 126/86  Pulse: 66     Resp: 16 18 16 16   Temp: 99 F (37.2 C)     TempSrc: Temporal     SpO2: 99% 98% 98% 99%  Weight: 253 lb (114.8 kg)     Height:  5\' 11"  (1.803 m)       Start Time: 1102 hrs. End Time: 1108 hrs.  Materials:  Needle(s) Type: Epidural needle Gauge: 17G Length: 5-in Medication(s): Please see orders for medications and dosing details. 7 cc solution made of 4 cc of preservative-free saline, 2 cc of 0.2% ropivacaine, 1 cc of Decadron 10 mg/cc.  Imaging Guidance (Spinal):          Type of Imaging Technique: Fluoroscopy Guidance (Spinal) Indication(s): Assistance in needle guidance and placement for procedures requiring needle placement in or near specific anatomical locations not easily accessible without such assistance. Exposure Time: Please see nurses notes. Contrast: Before injecting any contrast, we confirmed that the patient did not have an allergy to iodine, shellfish, or radiological contrast. Once satisfactory needle placement was completed at the desired level, radiological contrast was injected. Contrast injected under live fluoroscopy. No contrast complications. See chart for type and volume of contrast used. Fluoroscopic Guidance: I was personally present during the use of fluoroscopy. "Tunnel Vision Technique" used to obtain the best possible view of the target area. Parallax error corrected before commencing the procedure. "Direction-depth-direction" technique used to introduce the needle under continuous pulsed fluoroscopy. Once target was reached, antero-posterior, oblique, and lateral fluoroscopic projection used confirm needle placement in all planes. Images permanently stored in EMR. Interpretation: I personally interpreted the imaging intraoperatively. Adequate needle placement confirmed in multiple planes. Appropriate spread of contrast into desired area was observed. No evidence of afferent or efferent intravascular uptake.  No intrathecal or subarachnoid spread observed. Permanent images saved into the patient's record.  Antibiotic Prophylaxis:   Anti-infectives (From admission, onward)   None     Indication(s): None identified  Post-operative Assessment:  Post-procedure Vital Signs:  Pulse/HCG Rate: 66(!) 58 Temp: 99 F (37.2 C) Resp: 16 BP: 126/86 SpO2: 99 %  EBL: None  Complications: No immediate post-treatment complications observed by team, or reported by patient.  Note: The patient tolerated the entire procedure well. A repeat set of vitals were taken after the procedure and the patient was kept under observation following institutional policy, for this type of procedure. Post-procedural neurological assessment was performed, showing return to baseline, prior to discharge. The patient was provided with post-procedure discharge instructions, including a section on how to identify potential problems. Should any problems arise concerning this procedure, the patient was given instructions to immediately contact us, at any time, without hesitation. In any case, we plan to contact the patient by telephone for a follow-up status report regarding this interventional procedure.  Comments:  No additional relevant information.  Plan of Care  Orders:  Orders Placed This Encounter  Procedures  . DG PAIN [facility]IN[facility] C-ARM 1-60 MIN NO REPORT    Intraoperative interpretation by procedural physician at [facility].    Standing Status:   Standing    Number of Occurrences:   1    Order Specific Question:   Reason for exam:    Answer:   Assistance in needle guidance and placement for procedures requiring needle placement in or near specific anatomical locations not easily accessible without such assistance.   Patient instructed to restart Xarelto tomorrow so long as he is not having any lower extremity weakness.  Medications ordered for procedure: Meds ordered this encounter  Medications  . iohexol  (OMNIPAQUE) 180 MG/ML injection 10 mL    Must be Myelogram-compatible. If not available, you may substitute with a water-soluble, non-ionic, hypoallergenic, myelogram-compatible radiological contrast medium.  Marland Kitchen lidocaine (XYLOCAINE) 2 % (  with pres) injection 400 mg  . ropivacaine (PF) 2 mg/mL (0.2%) (NAROPIN) injection 2 mL  . sodium chloride flush (NS) 0.9 % injection 2 mL  . dexamethasone (DECADRON) injection 10 mg   Medications administered: We administered iohexol, lidocaine, ropivacaine (PF) 2 mg/mL (0.2%), sodium chloride flush, and dexamethasone.  See the medical record for exact dosing, route, and time of administration.  Follow-up plan:   Return in about 5 weeks (around 12/14/2019) for Post Procedure Evaluation, virtual.      Status post right L3, L4, L5 RFA on 10/20/2018, status post left L3, L4, L5 RFA on 02/23/2019.  Helpful in reducing his axial low back pain and improving his functional status.  Can repeat PRN after 6 months. L3/4 ESI 11/09/19 for low back + radiating hip pain, hip xray and SI joint xray largely unremarkable    Recent Visits Date Type Provider Dept  08/18/19 Office Visit Gillis Santa, MD [facility]  Showing recent visits within past 90 days and meeting all other requirements   Today's Visits Date Type Provider Dept  11/09/19 Procedure visit Gillis Santa, MD [facility]  Showing today's visits and meeting all other requirements   Future Appointments Date Type Provider Dept  12/23/19 Appointment Gillis Santa, MD [facility]  Showing future appointments within next 90 days and meeting all other requirements   Disposition: Discharge home  Discharge (Date  Time): 11/09/2019; 1120 hrs.   Primary Care Physician: Adin Hector, MD Location: [facility] Note by: Gillis Santa, MD Date: 11/09/2019; Time: 11:45 AM  Disclaimer:  Medicine is not an exact science. The only guarantee in medicine is that  nothing is guaranteed. It is important to note that the decision to proceed with this intervention was based on the information collected from the patient. The Data and conclusions were drawn from the patient's questionnaire, the interview, and the physical examination. Because the information was provided in large part by the patient, it cannot be guaranteed that it has not been purposely or unconsciously manipulated. Every effort has been made to obtain as much relevant data as possible for this evaluation. It is important to note that the conclusions that lead to this procedure are derived in large part from the available data. Always take into account that the treatment will also be dependent on availability of resources and existing treatment guidelines, considered by other Pain Management Practitioners as being common knowledge and practice, at the time of the intervention. For Medico-Legal purposes, it is also important to point out that variation in procedural techniques and pharmacological choices are the acceptable norm. The indications, contraindications, technique, and results of the above procedure should only be interpreted and judged by a Board-Certified Interventional Pain Specialist with extensive familiarity and expertise in the same exact procedure and technique.

## 2019-11-09 NOTE — Patient Instructions (Signed)
Pain Management Discharge Instructions  General Discharge Instructions :  If you need to reach your doctor call: Monday-Friday 8:00 am - 4:00 pm at [phone] or toll free [phone].  After clinic hours [phone] to have operator reach doctor.  Bring all of your medication bottles to all your appointments in the pain clinic.  To cancel or reschedule your appointment with Pain Management please remember to call 24 hours in advance to avoid a fee.  Refer to the educational materials which you have been given on: General Risks, I had my Procedure. Discharge Instructions, Post Sedation.  Post Procedure Instructions:  The drugs you were given will stay in your system until tomorrow, so for the next 24 hours you should not drive, make any legal decisions or drink any alcoholic beverages.  You may eat anything you prefer, but it is better to start with liquids then soups and crackers, and gradually work up to solid foods.  Please notify your doctor immediately if you have any unusual bleeding, trouble breathing or pain that is not related to your normal pain.  Depending on the type of procedure that was done, some parts of your body may feel week and/or numb.  This usually clears up by tonight or the next day.  Walk with the use of an assistive device or accompanied by an adult for the 24 hours.  You may use ice on the affected area for the first 24 hours.  Put ice in a Ziploc bag and cover with a towel and place against area 15 minutes on 15 minutes off.  You may switch to heat after 24 hours.Epidural Steroid Injection Patient Information  Description: The epidural space surrounds the nerves as they exit the spinal cord.  In some patients, the nerves can be compressed and inflamed by a bulging disc or a tight spinal canal (spinal stenosis).  By injecting steroids into the epidural space, we can bring irritated nerves into direct contact with a potentially helpful medication.  These  steroids act directly on the irritated nerves and can reduce swelling and inflammation which often leads to decreased pain.  Epidural steroids may be injected anywhere along the spine and from the neck to the low back depending upon the location of your pain.   After numbing the skin with local anesthetic (like Novocaine), a small needle is passed into the epidural space slowly.  You may experience a sensation of pressure while this is being done.  The entire block usually last less than 10 minutes.  Conditions which may be treated by epidural steroids:   Low back and leg pain  Neck and arm pain  Spinal stenosis  Post-laminectomy syndrome  Herpes zoster (shingles) pain  Pain from compression fractures  Preparation for the injection:  1. Do not eat any solid food or dairy products within 8 hours of your appointment.  2. You may drink clear liquids up to 3 hours before appointment.  Clear liquids include water, black coffee, juice or soda.  No milk or cream please. 3. You may take your regular medication, including pain medications, with a sip of water before your appointment  Diabetics should hold regular insulin (if taken separately) and take 1/2 normal NPH dos the morning of the procedure.  Carry some sugar containing items with you to your appointment. 4. A driver must accompany you and be prepared to drive you home after your procedure.  5. Bring all your current medications with your. 6. An IV may be inserted and   sedation may be given at the discretion of the physician.   7. A blood pressure cuff, EKG and other monitors will often be applied during the procedure.  Some patients may need to have extra oxygen administered for a short period. 8. You will be asked to provide medical information, including your allergies, prior to the procedure.  We must know immediately if you are taking blood thinners (like Coumadin/Warfarin)  Or if you are allergic to IV iodine contrast (dye). We must  know if you could possible be pregnant.  Possible side-effects:  Bleeding from needle site  Infection (rare, may require surgery)  Nerve injury (rare)  Numbness & tingling (temporary)  Difficulty urinating (rare, temporary)  Spinal headache ( a headache worse with upright posture)  Light -headedness (temporary)  Pain at injection site (several days)  Decreased blood pressure (temporary)  Weakness in arm/leg (temporary)  Pressure sensation in back/neck (temporary)  Call if you experience:  Fever/chills associated with headache or increased back/neck pain.  Headache worsened by an upright position.  New onset weakness or numbness of an extremity below the injection site  Hives or difficulty breathing (go to the emergency room)  Inflammation or drainage at the infection site  Severe back/neck pain  Any new symptoms which are concerning to you  Please note:  Although the local anesthetic injected can often make your back or neck feel good for several hours after the injection, the pain will likely return.  It takes 3-7 days for steroids to work in the epidural space.  You may not notice any pain relief for at least that one week.  If effective, we will often do a series of three injections spaced 3-6 weeks apart to maximally decrease your pain.  After the initial series, we generally will wait several months before considering a repeat injection of the same type.  If you have any questions, please call [phone] [facility] 

## 2019-11-10 ENCOUNTER — Telehealth: Payer: Self-pay | Admitting: *Deleted

## 2019-11-10 NOTE — Telephone Encounter (Signed)
Called for post procedure check. No answer. LVM. 

## 2019-11-19 ENCOUNTER — Encounter: Payer: Self-pay | Admitting: Podiatry

## 2019-11-19 ENCOUNTER — Other Ambulatory Visit: Payer: Self-pay

## 2019-11-19 ENCOUNTER — Ambulatory Visit (INDEPENDENT_AMBULATORY_CARE_PROVIDER_SITE_OTHER): Payer: BC Managed Care – PPO | Admitting: Podiatry

## 2019-11-19 ENCOUNTER — Ambulatory Visit (INDEPENDENT_AMBULATORY_CARE_PROVIDER_SITE_OTHER): Payer: BC Managed Care – PPO

## 2019-11-19 ENCOUNTER — Other Ambulatory Visit: Payer: Self-pay | Admitting: Podiatry

## 2019-11-19 VITALS — Temp 97.3°F

## 2019-11-19 DIAGNOSIS — M778 Other enthesopathies, not elsewhere classified: Secondary | ICD-10-CM | POA: Diagnosis not present

## 2019-11-19 DIAGNOSIS — M7750 Other enthesopathy of unspecified foot: Secondary | ICD-10-CM | POA: Diagnosis not present

## 2019-11-19 DIAGNOSIS — M109 Gout, unspecified: Secondary | ICD-10-CM

## 2019-11-19 NOTE — Progress Notes (Signed)
This patient presents the office stating he is having pain on the inside border of the big toe of his right foot.  Patient states that he has developed redness and pain for approximately 2 to 3 days.  He says that the pain is not as severe as an acute gout attack.  He says he has had a history of gout years ago but has not had an episode since he has been taking his allopurinol.  Patient is able to point to the localized area of pain on the inside of his big toe right foot.  He believes he may have glass in his foot. Patient has been taking ibuprofen as well as allopurinol and he feels this is foot pain is improving.  He presents the office stating that it is aggravated by activity.  He presents the office today for an evaluation and treatment of this big toe joint right foot.  Vascular  Dorsalis pedis and posterior tibial pulses are palpable  B/L.  Capillary return  WNL.  Temperature gradient is  WNL.  Skin turgor  WNL  Sensorium  Senn Weinstein monofilament wire  WNL. Normal tactile sensation.  Nail Exam  Patient has normal nails with no evidence of bacterial or fungal infection.  Orthopedic  Exam  Muscle tone and muscle strength  WNL.  No limitations of motion feet  B/L.  No crepitus or joint effusion noted.  Foot type is unremarkable and digits show no abnormalities.  An enlarged first MPJ right foot is noted which is the foot that he has had previous gout attacks.  Palpable pain noted on the plantar medial aspect of the first MPJ right foot. No palpable pain to sesamoid apparatus.  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    Skin  No open lesions.  Normal skin texture and turgor.  Capsulitis 1st MPJ right foot  S/P gout right foot.  ROV.  X-ray taken of his right foot and no evidence of a fracture or any arthritic changes in the big toe joint right foot.  Discussed this condition with this patient.  Injection therapy was provided in the medial aspect first MPJ right foot.Injection  therapy using 1.0 cc. Of 2% xylocaine( 20 mg.) plus 1 cc. of kenalog-la ( 10 mg) plus 1/2 cc. of dexamethazone phosphate ( 2 mg).  Patient was told to return to the office for further treatment.   Gardiner Barefoot DPM

## 2019-12-22 ENCOUNTER — Telehealth: Payer: Self-pay | Admitting: *Deleted

## 2019-12-22 NOTE — Telephone Encounter (Signed)
Attempted to call for pre appointment review of allergies/meds. Message left. 

## 2019-12-23 ENCOUNTER — Ambulatory Visit
Payer: No Typology Code available for payment source | Attending: Student in an Organized Health Care Education/Training Program | Admitting: Student in an Organized Health Care Education/Training Program

## 2019-12-23 ENCOUNTER — Other Ambulatory Visit: Payer: Self-pay

## 2019-12-23 ENCOUNTER — Encounter: Payer: Self-pay | Admitting: Student in an Organized Health Care Education/Training Program

## 2019-12-23 DIAGNOSIS — G894 Chronic pain syndrome: Secondary | ICD-10-CM

## 2019-12-23 DIAGNOSIS — M5416 Radiculopathy, lumbar region: Secondary | ICD-10-CM | POA: Diagnosis not present

## 2019-12-23 NOTE — Progress Notes (Signed)
Patient: [name]  Service Category: E/M  Provider: Gillis Santa, MD  DOB: [birth year]  DOS: 12/23/2019  Location: Office  MRN: [id number]  Setting: Ambulatory outpatient  Referring Provider: Adin Hector, MD  Type: Established Patient  Specialty: Interventional Pain Management  PCP: Adin Hector, MD  Location: Home  Delivery: TeleHealth     Virtual Encounter - Pain Management PROVIDER NOTE: Information contained herein reflects review and annotations entered in association with encounter. Interpretation of such information and data should be left to medically-trained personnel. Information provided to patient can be located elsewhere in the medical record under "Patient Instructions". Document created using STT-dictation technology, any transcriptional errors that may result from process are unintentional.    Contact & Pharmacy Preferred: [place]: [phone] (home) Mobile: [phone] (mobile) E-mail: [email or web address]  [id number] [address]Phone: [phone]: [phone] [id number] [place] [address]: [phone]: [phone]  Pre-screening  [name]. HYsidro Evertoffered "in-person" vs "virtual" encounter. He indicated preferring virtual for this encounter.   Reason COVID-19*  Social distancing based on CDC and AMA recommendations.   I contacted [name] Clineson 12/23/2019 via video conference.      I clearly identified myself as BGillis Santa MD. I verified that I was speaking with the correct person using two identifiers (Name: SYona Stansbury and date of birth: 53Feb 01, 1967.  Consent I sought verbal advanced consent from [name] Clinesfor virtual visit interactions. I informed [name] possible security and privacy  concerns, risks, and limitations associated with providing "not-in-person" medical evaluation and management services. I also informed [name] the availability of "in-person" appointments. Finally, I informed him that there would be a charge for the virtual visit and that he could be  personally, fully or partially, financially responsible for it. [name] understanding and agreed to proceed.   Historic Elements   [name]. SBrees [name] a 54y.o. year old, male patient evaluated today after his last contact with our practice on 12/22/2019. [name] has a past medical history of Anxiety, Arthritis, Atrial fibrillation ([place] (1989), Bicuspid aortic valve, Cardiomyopathy (08/2009), Depression, GERD (gastroesophageal reflux disease), Gout, Heat stroke (1998), Hyperlipidemia, Hypertension, Seizures ([place] (1998), and throat cancer. He also  has a past surgical history that includes Cardioversion (1989); Cardiac catheterization (03/2010); and Total knee arthroplasty (Left). [name]. HBlatzhas a current medication list which includes the following prescription(s): allopurinol, atorvastatin, carvedilol, cholecalciferol, docusate sodium, entresto, fluoxetine, glucosamine-chondroitin, hydrocodone-acetaminophen, ibuprofen, krill oil, levothyroxine, magnesium, magnesium oxide, meclizine, melatonin, mirtazapine, multivitamin, rivaroxaban, and vitamin c. He  reports that he has never smoked. He has never used smokeless tobacco. He reports current alcohol use of about 2.0 - 3.0 standard drinks of alcohol per week. He reports that he does not use drugs. [name]. HHoarhas No Known Allergies.   HPI  Today, he is being contacted for a post-procedure assessment.  Evaluation of last interventional procedure  11/09/2019 Procedure:   Type: Therapeutic Inter-Laminar Epidural Steroid Injection  #1  Region: Lumbar Level: L3-4 Level. Laterality: Midline       Sedation: Please see nurses note for DOS. When no  sedatives are used, the analgesic levels obtained are directly associated to the effectiveness of the local anesthetics. However, when sedation is provided,  the level of analgesia obtained during the initial 1 hour following the intervention, is believed to be the result of a combination of factors. These factors may include, but are not limited to: 1. The effectiveness of the local anesthetics used. 2. The effects of the analgesic(s) and/or anxiolytic(s) used. 3. The degree of discomfort experienced by the patient at the time of the procedure. 4. The patients ability and reliability in recalling and recording the events. 5. The presence and influence of possible secondary gains and/or psychosocial factors. Reported result: Relief experienced during the 1st hour after the procedure: 80%   (Ultra-Short Term Relief)            Interpretative annotation: Clinically appropriate result. Analgesia during this period is likely to be Local Anesthetic and/or IV Sedative (Analgesic/Anxiolytic) related.          Effects of local anesthetic: The analgesic effects attained during this period are directly associated to the localized infiltration of local anesthetics and therefore cary significant diagnostic value as to the etiological location, or anatomical origin, of the pain. Expected duration of relief is directly dependent on the pharmacodynamics of the local anesthetic used. Long-acting (4-6 hours) anesthetics used.  Reported result: Relief during the next 4 to 6 hour after the procedure:80%   (Short-Term Relief)            Interpretative annotation: Clinically appropriate result. Analgesia during this period is likely to be Local Anesthetic-related.          Long-term benefit: Defined as the period of time past the expected duration of local anesthetics (1 hour for short-acting and 4-6 hours for long-acting). With the possible exception of prolonged sympathetic blockade from the local anesthetics, benefits  during this period are typically attributed to, or associated with, other factors such as analgesic sensory neuropraxia, antiinflammatory effects, or beneficial biochemical changes provided by agents other than the local anesthetics.  Reported result: Extended relief following procedure: 60%   (Long-Term Relief)            Interpretative annotation: Clinically appropriate result. Good relief. No permanent benefit expected. Inflammation plays a part in the etiology to the pain.           Repeat ESI #2  Laboratory Chemistry Profile   Renal Lab Results  Component Value Date   BUN 19 10/13/2019   CREATININE 1.08 10/13/2019   BCR 15 12/16/2017   GFR 77.83 06/26/2016   GFRAA >60 10/13/2019   GFRNONAA >60 10/13/2019     Hepatic Lab Results  Component Value Date   AST 34 05/09/2017   ALT 34 05/09/2017   ALBUMIN 4.5 05/09/2017   ALKPHOS 66 05/09/2017   HCVAB NEG 10/29/2008   AMYLASE 41 11/06/2008   LIPASE 19 11/06/2008     Electrolytes Lab Results  Component Value Date   NA 138 10/13/2019   K 4.3 10/13/2019   CL 101 10/13/2019   CALCIUM 9.2 10/13/2019   MG 2.1 04/16/2013   PHOS 2.5 06/13/2010     Bone No results found for: VD25OH, VD125OH2TOT, KT6256LS9, HT3428JG8, 25OHVITD1, 25OHVITD2, 25OHVITD3, TESTOFREE, TESTOSTERONE   Inflammation (CRP: Acute Phase) (ESR: Chronic Phase) Lab Results  Component Value Date   CRP 2.7 07/27/2014   ESRSEDRATE 9 07/27/2014       Note: Above Lab results reviewed.  Imaging  DG Foot Complete Right Please see detailed radiograph report in office note.  Assessment  The primary encounter diagnosis was Lumbar radiculopathy. A diagnosis of Chronic pain syndrome was also  pertinent to this visit.  Plan of Care  [name]. Ezariah Nace has a current medication list which includes the following long-term medication(s): fluoxetine, levothyroxine, mirtazapine, and rivaroxaban.  Plan to repeat ESI at L3-L4 again.  We will increase epidural volume  this time.  Patient instructed to stop Xarelto 3 days prior to scheduled procedure.  Orders:  Orders Placed This Encounter  Procedures  . Lumbar Epidural Injection    Standing Status:   Future    Standing Expiration Date:   01/23/2020    Scheduling Instructions:     Procedure: Interlaminar Lumbar Epidural Steroid injection (LESI)            Laterality: Midline     Sedation: without     Timeframe: ASAA    Order Specific Question:   Where will this procedure be performed?    Answer:   [facility]   Follow-up plan:   Return in about 2 weeks (around 01/06/2020) for L3/4 ESI (stop Xarelto 3 days prior).     Status post right L3, L4, L5 RFA on 10/20/2018, status post left L3, L4, L5 RFA on 02/23/2019.  Helpful in reducing his axial low back pain and improving his functional status.  Can repeat PRN after 6 months. L3/4 ESI 11/09/19 for low back + radiating hip pain, hip xray and SI joint xray largely unremarkable     Recent Visits Date Type Provider Dept  11/09/19 Procedure visit Gillis Santa, MD [facility]  Showing recent visits within past 90 days and meeting all other requirements   Today's Visits Date Type Provider Dept  12/23/19 Telemedicine Gillis Santa, MD [facility]  Showing today's visits and meeting all other requirements   Future Appointments No visits were found meeting these conditions.  Showing future appointments within next 90 days and meeting all other requirements   I discussed the assessment and treatment plan with the patient. The patient was provided an opportunity to ask questions and all were answered. The patient agreed with the plan and demonstrated an understanding of the instructions.  Patient advised to call back or seek an in-person evaluation if the symptoms or condition worsens.  Duration of encounter: 13mnutes.  Note by: BGillis Santa MD Date: 12/23/2019; Time: 2:44 PM

## 2019-12-30 ENCOUNTER — Ambulatory Visit
Payer: No Typology Code available for payment source | Admitting: Student in an Organized Health Care Education/Training Program

## 2020-01-11 ENCOUNTER — Encounter: Payer: Self-pay | Admitting: Student in an Organized Health Care Education/Training Program

## 2020-01-11 ENCOUNTER — Other Ambulatory Visit: Payer: Self-pay

## 2020-01-11 ENCOUNTER — Ambulatory Visit (HOSPITAL_BASED_OUTPATIENT_CLINIC_OR_DEPARTMENT_OTHER)
Payer: No Typology Code available for payment source | Admitting: Student in an Organized Health Care Education/Training Program

## 2020-01-11 ENCOUNTER — Ambulatory Visit
Admission: RE | Admit: 2020-01-11 | Discharge: 2020-01-11 | Disposition: A | Discharge status: Home / Self Care | Payer: No Typology Code available for payment source | Source: Ambulatory Visit | Attending: Student in an Organized Health Care Education/Training Program | Admitting: Student in an Organized Health Care Education/Training Program

## 2020-01-11 ENCOUNTER — Other Ambulatory Visit: Payer: Self-pay | Admitting: Student in an Organized Health Care Education/Training Program

## 2020-01-11 DIAGNOSIS — R52 Pain, unspecified: Secondary | ICD-10-CM

## 2020-01-11 DIAGNOSIS — M5416 Radiculopathy, lumbar region: Secondary | ICD-10-CM

## 2020-01-11 MED ORDER — IOHEXOL 180 MG/ML  SOLN
10.0000 mL | Freq: Once | INTRAMUSCULAR | Status: AC
  Administered 2020-01-11: 10 mL via EPIDURAL

## 2020-01-11 MED ORDER — SODIUM CHLORIDE 0.9% FLUSH
2.0000 mL | Freq: Once | INTRAVENOUS | Status: AC
  Administered 2020-01-11: 2 mL

## 2020-01-11 MED ORDER — ROPIVACAINE HCL 2 MG/ML IJ SOLN
2.0000 mL | Freq: Once | INTRAMUSCULAR | Status: AC
  Administered 2020-01-11: 2 mL via EPIDURAL

## 2020-01-11 MED ORDER — ROPIVACAINE HCL 2 MG/ML IJ SOLN
INTRAMUSCULAR | Status: AC
  Filled 2020-01-11: qty 10

## 2020-01-11 MED ORDER — IOHEXOL 180 MG/ML  SOLN
INTRAMUSCULAR | Status: AC
  Filled 2020-01-11: qty 20

## 2020-01-11 MED ORDER — LIDOCAINE HCL 2 % IJ SOLN
20.0000 mL | Freq: Once | INTRAMUSCULAR | Status: AC
  Administered 2020-01-11: 400 mg

## 2020-01-11 MED ORDER — DEXAMETHASONE SODIUM PHOSPHATE 10 MG/ML IJ SOLN
10.0000 mg | Freq: Once | INTRAMUSCULAR | Status: AC
  Administered 2020-01-11: 10 mg

## 2020-01-11 MED ORDER — LIDOCAINE HCL 2 % IJ SOLN
INTRAMUSCULAR | Status: AC
  Filled 2020-01-11: qty 20

## 2020-01-11 MED ORDER — SODIUM CHLORIDE (PF) 0.9 % IJ SOLN
INTRAMUSCULAR | Status: AC
  Filled 2020-01-11: qty 10

## 2020-01-11 MED ORDER — DEXAMETHASONE SODIUM PHOSPHATE 10 MG/ML IJ SOLN
INTRAMUSCULAR | Status: AC
  Filled 2020-01-11: qty 1

## 2020-01-11 NOTE — Progress Notes (Signed)
Safety precautions to be maintained throughout the outpatient stay will include: orient to surroundings, keep bed in low position, maintain call bell within reach at all times, provide assistance with transfer out of bed and ambulation.  

## 2020-01-11 NOTE — Progress Notes (Signed)
PROVIDER NOTE: Information contained herein reflects review and annotations entered in association with encounter. Interpretation of such information and data should be left to medically-trained personnel. Information provided to patient can be located elsewhere in the medical record under "Patient Instructions". Document created using STT-dictation technology, any transcriptional errors that may result from process are unintentional.    Patient: [name]  Service Category: Procedure  Provider: Gillis Santa, MD  DOB: [birth year]  DOS: 01/11/2020  Location: [facility]  MRN: [id number]  Setting: Ambulatory - outpatient  Referring Provider: Gillis Santa, MD  Type: Established Patient  Specialty: Interventional Pain Management  PCP: Adin Hector, MD   Primary Reason for Visit: Interventional Pain Management Treatment. CC: Back Pain (lumbar bilateral )  Procedure:          Anesthesia, Analgesia, Anxiolysis:  Type: Therapeutic Inter-Laminar Epidural Steroid Injection  #2  Region: Lumbar Level: L3-4 Level. Laterality: Midline         Type: Local Anesthesia  Local Anesthetic: Lidocaine 1-2%  Position: Prone with head of the table was raised to facilitate breathing.   Indications: 1. Lumbar radiculopathy    Pain Score: Pre-procedure: 6 /10 Post-procedure: 3 /10   Patient stopped  Xarelto 3 days prior to procedure.  Pre-op Assessment:  Mr. Pottinger is a 54 y.o. (year old), male patient, seen today for interventional treatment. He  has a past surgical history that includes Cardioversion (1989); Cardiac catheterization (03/2010); and Total knee arthroplasty (Left). Mr. Asche has a current medication list which includes the following prescription(s): allopurinol, atorvastatin, carvedilol, cholecalciferol, docusate sodium, entresto, fluoxetine, glucosamine-chondroitin, hydrocodone-acetaminophen, ibuprofen, krill oil, levothyroxine, magnesium, meclizine, melatonin,  mirtazapine, multivitamin, rivaroxaban, vitamin c, and magnesium oxide, and the following Facility-Administered Medications: dexamethasone, iohexol, lidocaine, ropivacaine (pf) 2 mg/ml (0.2%), and sodium chloride flush. His primarily concern today is the Back Pain (lumbar bilateral )  Initial Vital Signs:  Pulse/HCG Rate: 69  Temp: 98.1 F (36.7 C) Resp: 16 BP: 113/77 SpO2: 99 %  BMI: Estimated body mass index is 36.3 kg/m as calculated from the following:   Height as of this encounter: 5\' 10"  (1.778 m).   Weight as of this encounter: 253 lb (114.8 kg).  Risk Assessment: Allergies: Reviewed. He has No Known Allergies.  Allergy Precautions: None required Coagulopathies: Reviewed. None identified.  Blood-thinner therapy: None at this time Active Infection(s): Reviewed. None identified. Mr. Sisler is afebrile  Site Confirmation: Mr. Poitras was asked to confirm the procedure and laterality before marking the site Procedure checklist: Completed Consent: Before the procedure and under the influence of no sedative(s), amnesic(s), or anxiolytics, the patient was informed of the treatment options, risks and possible complications. To fulfill our ethical and legal obligations, as recommended by the American Medical Association's Code of Ethics, I have informed the patient of my clinical impression; the nature and purpose of the treatment or procedure; the risks, benefits, and possible complications of the intervention; the alternatives, including doing nothing; the risk(s) and benefit(s) of the alternative treatment(s) or procedure(s); and the risk(s) and benefit(s) of doing nothing. The patient was provided information about the general risks and possible complications associated with the procedure. These may include, but are not limited to: failure to achieve desired goals, infection, bleeding, organ or nerve damage, allergic reactions, paralysis, and death. In addition, the patient was informed of  those risks and complications associated to Spine-related procedures, such as failure to decrease pain; infection (i.e.: Meningitis, epidural or intraspinal abscess); bleeding (i.e.: epidural hematoma,  subarachnoid hemorrhage, or any other type of intraspinal or peri-dural bleeding); organ or nerve damage (i.e.: Any type of peripheral nerve, nerve root, or spinal cord injury) with subsequent damage to sensory, motor, and/or autonomic systems, resulting in permanent pain, numbness, and/or weakness of one or several areas of the body; allergic reactions; (i.e.: anaphylactic reaction); and/or death. Furthermore, the patient was informed of those risks and complications associated with the medications. These include, but are not limited to: allergic reactions (i.e.: anaphylactic or anaphylactoid reaction(s)); adrenal axis suppression; blood sugar elevation that in diabetics may result in ketoacidosis or comma; water retention that in patients with history of congestive heart failure may result in shortness of breath, pulmonary edema, and decompensation with resultant heart failure; weight gain; swelling or edema; medication-induced neural toxicity; particulate matter embolism and blood vessel occlusion with resultant organ, and/or nervous system infarction; and/or aseptic necrosis of one or more joints. Finally, the patient was informed that Medicine is not an exact science; therefore, there is also the possibility of unforeseen or unpredictable risks and/or possible complications that may result in a catastrophic outcome. The patient indicated having understood very clearly. We have given the patient no guarantees and we have made no promises. Enough time was given to the patient to ask questions, all of which were answered to the patient's satisfaction. Mr. Madole has indicated that he wanted to continue with the procedure. Attestation: I, the ordering provider, attest that I have discussed with the patient the  benefits, risks, side-effects, alternatives, likelihood of achieving goals, and potential problems during recovery for the procedure that I have provided informed consent. Date  Time: 01/11/2020  8:50 AM  Pre-Procedure Preparation:  Monitoring: As per clinic protocol. Respiration, ETCO2, SpO2, BP, heart rate and rhythm monitor placed and checked for adequate function Safety Precautions: Patient was assessed for positional comfort and pressure points before starting the procedure. Time-out: I initiated and conducted the "Time-out" before starting the procedure, as per protocol. The patient was asked to participate by confirming the accuracy of the "Time Out" information. Verification of the correct person, site, and procedure were performed and confirmed by me, the nursing staff, and the patient. "Time-out" conducted as per Joint Commission's Universal Protocol (UP.01.01.01). Time: 0923  Description of Procedure:          Target Area: The interlaminar space, initially targeting the lower laminar border of the superior vertebral body. Approach: Paramedial approach. Area Prepped: Entire Posterior Lumbar Region DuraPrep (Iodine Povacrylex [0.7% available iodine] and Isopropyl Alcohol, 74% w/w) Safety Precautions: Aspiration looking for blood return was conducted prior to all injections. At no point did we inject any substances, as a needle was being advanced. No attempts were made at seeking any paresthesias. Safe injection practices and needle disposal techniques used. Medications properly checked for expiration dates. SDV (single dose vial) medications used. Description of the Procedure: Protocol guidelines were followed. The procedure needle was introduced through the skin, ipsilateral to the reported pain, and advanced to the target area. Bone was contacted and the needle walked caudad, until the lamina was cleared. The epidural space was identified using "loss-of-resistance technique" with 2-3 ml of  PF-NaCl (0.9% NSS), in a 5cc LOR glass syringe.  Vitals:   01/11/20 0859 01/11/20 0915 01/11/20 0925 01/11/20 0935  BP: 113/77 123/73 114/79 121/81  Pulse: 69 67 72 70  Resp: 16 16 12 13   Temp: 98.1 F (36.7 C)     TempSrc: Temporal     SpO2: 99% 99% 97% 98%  Weight: 253 lb (114.8 kg)     Height: 5\' 10"  (1.778 m)       Start Time: 0923 hrs. End Time: 0929 hrs.  Materials:  Needle(s) Type: Epidural needle Gauge: 17G Length: 5-in Medication(s): Please see orders for medications and dosing details. 8 cc solution made of 4 cc of preservative-free saline, 3 cc of 0.2% ropivacaine, 1 cc of Decadron 10 mg/cc.  Imaging Guidance (Spinal):          Type of Imaging Technique: Fluoroscopy Guidance (Spinal) Indication(s): Assistance in needle guidance and placement for procedures requiring needle placement in or near specific anatomical locations not easily accessible without such assistance. Exposure Time: Please see nurses notes. Contrast: Before injecting any contrast, we confirmed that the patient did not have an allergy to iodine, shellfish, or radiological contrast. Once satisfactory needle placement was completed at the desired level, radiological contrast was injected. Contrast injected under live fluoroscopy. No contrast complications. See chart for type and volume of contrast used. Fluoroscopic Guidance: I was personally present during the use of fluoroscopy. "Tunnel Vision Technique" used to obtain the best possible view of the target area. Parallax error corrected before commencing the procedure. "Direction-depth-direction" technique used to introduce the needle under continuous pulsed fluoroscopy. Once target was reached, antero-posterior, oblique, and lateral fluoroscopic projection used confirm needle placement in all planes. Images permanently stored in EMR. Interpretation: I personally interpreted the imaging intraoperatively. Adequate needle placement confirmed in multiple planes.  Appropriate spread of contrast into desired area was observed. No evidence of afferent or efferent intravascular uptake. No intrathecal or subarachnoid spread observed. Permanent images saved into the patient's record.  Antibiotic Prophylaxis:   Anti-infectives (From admission, onward)   None     Indication(s): None identified  Post-operative Assessment:  Post-procedure Vital Signs:  Pulse/HCG Rate: 70  Temp: 98.1 F (36.7 C) Resp: 13 BP: 121/81 SpO2: 98 %  EBL: None  Complications: No immediate post-treatment complications observed by team, or reported by patient.  Note: The patient tolerated the entire procedure well. A repeat set of vitals were taken after the procedure and the patient was kept under observation following institutional policy, for this type of procedure. Post-procedural neurological assessment was performed, showing return to baseline, prior to discharge. The patient was provided with post-procedure discharge instructions, including a section on how to identify potential problems. Should any problems arise concerning this procedure, the patient was given instructions to immediately contact us, at any time, without hesitation. In any case, we plan to contact the patient by telephone for a follow-up status report regarding this interventional procedure.  Comments:  No additional relevant information.  Plan of Care  Orders:  Orders Placed This Encounter  Procedures  . DG PAIN CLINIC C-ARM 1-60 MIN NO REPORT    Intraoperative interpretation by procedural physician at [place].    Standing Status:   Standing    Number of Occurrences:   1    Order Specific Question:   Reason for exam:    Answer:   Assistance in needle guidance and placement for procedures requiring needle placement in or near specific anatomical locations not easily accessible without such assistance.   Patient instructed to restart Xarelto tomorrow so long as he is not having any  lower extremity weakness.  Medications ordered for procedure: Meds ordered this encounter  Medications  . iohexol (OMNIPAQUE) 180 MG/ML injection 10 mL    Must be Myelogram-compatible. If not available, you may substitute with a water-soluble, non-ionic, hypoallergenic,  myelogram-compatible radiological contrast medium.  Marland Kitchen lidocaine (XYLOCAINE) 2 % (with pres) injection 400 mg  . ropivacaine (PF) 2 mg/mL (0.2%) (NAROPIN) injection 2 mL  . sodium chloride flush (NS) 0.9 % injection 2 mL  . dexamethasone (DECADRON) injection 10 mg   Medications administered: Carrie Mew had no medications administered during this visit.  See the medical record for exact dosing, route, and time of administration.  Follow-up plan:   Return in about 6 weeks (around 02/22/2020) for Post Procedure Evaluation, virtual.      Status post right L3, L4, L5 RFA on 10/20/2018, status post left L3, L4, L5 RFA on 02/23/2019.  Helpful in reducing his axial low back pain and improving his functional status.  Can repeat PRN after 6 months. L3/4 ESI 4/5/211 01/11/20 for low back + radiating hip pain, hip xray and SI joint xray largely unremarkable    Recent Visits Date Type Provider Dept  12/23/19 Telemedicine Gillis Santa, MD [facility]  11/09/19 Procedure visit Gillis Santa, MD [facility]  Showing recent visits within past 90 days and meeting all other requirements   Today's Visits Date Type Provider Dept  01/11/20 Procedure visit Gillis Santa, MD [facility]  Showing today's visits and meeting all other requirements   Future Appointments Date Type Provider Dept  02/22/20 Appointment Gillis Santa, MD [facility]  Showing future appointments within next 90 days and meeting all other requirements   Disposition: Discharge home  Discharge (Date  Time): 01/11/2020; 0935 hrs.   Primary Care Physician: Adin Hector, MD Location: [facility]  Facility Note by: Gillis Santa, MD Date: 01/11/2020; Time: 9:46 AM  Disclaimer:  Medicine is not an exact science. The only guarantee in medicine is that nothing is guaranteed. It is important to note that the decision to proceed with this intervention was based on the information collected from the patient. The Data and conclusions were drawn from the patient's questionnaire, the interview, and the physical examination. Because the information was provided in large part by the patient, it cannot be guaranteed that it has not been purposely or unconsciously manipulated. Every effort has been made to obtain as much relevant data as possible for this evaluation. It is important to note that the conclusions that lead to this procedure are derived in large part from the available data. Always take into account that the treatment will also be dependent on availability of resources and existing treatment guidelines, considered by other Pain Management Practitioners as being common knowledge and practice, at the time of the intervention. For Medico-Legal purposes, it is also important to point out that variation in procedural techniques and pharmacological choices are the acceptable norm. The indications, contraindications, technique, and results of the above procedure should only be interpreted and judged by a Board-Certified Interventional Pain Specialist with extensive familiarity and expertise in the same exact procedure and technique.

## 2020-01-12 ENCOUNTER — Encounter: Payer: Self-pay | Admitting: Student in an Organized Health Care Education/Training Program

## 2020-01-12 ENCOUNTER — Telehealth: Payer: Self-pay

## 2020-01-12 NOTE — Telephone Encounter (Signed)
Pt was called and message left on answering service. 

## 2020-02-18 ENCOUNTER — Telehealth: Payer: Self-pay | Admitting: *Deleted

## 2020-02-18 NOTE — Telephone Encounter (Signed)
Attempted to reach patient prior to VV, no answer and mailbox is full.

## 2020-02-22 ENCOUNTER — Encounter: Payer: Self-pay | Admitting: Student in an Organized Health Care Education/Training Program

## 2020-02-22 ENCOUNTER — Other Ambulatory Visit: Payer: Self-pay

## 2020-02-22 ENCOUNTER — Ambulatory Visit
Payer: No Typology Code available for payment source | Attending: Student in an Organized Health Care Education/Training Program | Admitting: Student in an Organized Health Care Education/Training Program

## 2020-02-22 DIAGNOSIS — M47816 Spondylosis without myelopathy or radiculopathy, lumbar region: Secondary | ICD-10-CM

## 2020-02-22 DIAGNOSIS — G894 Chronic pain syndrome: Secondary | ICD-10-CM

## 2020-02-22 NOTE — Progress Notes (Signed)
Patient: [name]  Service Category: E/M  Provider: Gillis Santa, MD  DOB: [birth year]  DOS: 02/22/2020  Location: Office  MRN: [id number]  Setting: Ambulatory outpatient  Referring Provider: Adin Hector, MD  Type: Established Patient  Specialty: Interventional Pain Management  PCP: Adin Hector, MD  Location: Home  Delivery: TeleHealth     Virtual Encounter - Pain Management PROVIDER NOTE: Information contained herein reflects review and annotations entered in association with encounter. Interpretation of such information and data should be left to medically-trained personnel. Information provided to patient can be located elsewhere in the medical record under "Patient Instructions". Document created using STT-dictation technology, any transcriptional errors that may result from process are unintentional.    Contact & Pharmacy Preferred: [place]: [phone] (home) Mobile: [phone] (mobile) E-mail: [name]  [id number] [place]Phone: [phone]: [phone] [id number] [place]: [phone]: [phone]  Pre-screening  Mr. HYsidro Evertoffered "in-person" vs "virtual" encounter. He indicated preferring virtual for this encounter.   Reason COVID-19*  Social distancing based on CDC and AMA recommendations.   I contacted SEvlyn Clineson 02/22/2020 via video conference.      I clearly identified myself as BGillis Santa MD. I verified that I was speaking with the correct person using two identifiers (Name: SAmillion Macchia and date of birth: 201-18-1967.  Consent I sought verbal advanced consent from SEvlyn Clinesfor virtual visit interactions. I informed Mr. HKirtzof possible security and privacy  concerns, risks, and limitations associated with providing "not-in-person" medical evaluation and management services. I also informed Mr. HRackof the availability of "in-person" appointments. Finally, I informed him that there would be a charge for the virtual visit and that he could be  personally, fully or partially, financially responsible for it. Mr. HGladsonexpressed understanding and agreed to proceed.   Historic Elements   Mr. SEdy Beltis a 54y.o. year old, male patient evaluated today after his last contact with our practice on 02/18/2020. Mr. HTappan has a past medical history of Anxiety, Arthritis, Atrial fibrillation (HTabor (1989), Bicuspid aortic valve, Cardiomyopathy (08/2009), Depression, GERD (gastroesophageal reflux disease), Gout, Heat stroke (1998), Hyperlipidemia, Hypertension, Seizures (HThatcher (1998), and throat cancer. He also  has a past surgical history that includes Cardioversion (1989); Cardiac catheterization (03/2010); and Total knee arthroplasty (Left). Mr. HEidemhas a current medication list which includes the following prescription(s): allopurinol, atorvastatin, carvedilol, cholecalciferol, docusate sodium, entresto, fluoxetine, glucosamine-chondroitin, hydrocodone-acetaminophen, ibuprofen, krill oil, levothyroxine, magnesium, magnesium oxide, meclizine, melatonin, mirtazapine, multivitamin, rivaroxaban, and vitamin c. He  reports that he has never smoked. He has never used smokeless tobacco. He reports current alcohol use of about 2.0 - 3.0 standard drinks of alcohol per week. He reports that he does not use drugs. Mr. HMishhas No Known Allergies.   HPI  Today, he is being contacted for a post-procedure assessment.  Postprocedural follow-up status post lumbar epidural steroid injection at L3-L4 which was performed on 01/11/2020.  Patient states that the injection provided mild to moderate benefit for his right hip pain and radiating leg pain.  He is now complaining  of axial low back pain that is worse with lumbar extension and facet loading.  Patient's prior lumbar L3, L4, L5 radiofrequency ablation  was performed on 05/25/2019 and 06/10/2019 respectively.  Given that is been greater than 6 months since his previous radiofrequency ablation, we discussed repeating to help with his low back pain related to lumbar facet arthropathy.  Patient receives greater than 50% pain relief after his lumbar radiofrequency ablations that last 4 to 6 months with gradual return of pain thereafter.  We will plan on repeating starting with the right side first.  Patient instructed to stop Xarelto 3 days prior to scheduled procedure.   Laboratory Chemistry Profile   Renal Lab Results  Component Value Date   BUN 19 10/13/2019   CREATININE 1.08 10/13/2019   BCR 15 12/16/2017   GFR 77.83 06/26/2016   GFRAA >60 10/13/2019   GFRNONAA >60 10/13/2019     Hepatic Lab Results  Component Value Date   AST 34 05/09/2017   ALT 34 05/09/2017   ALBUMIN 4.5 05/09/2017   ALKPHOS 66 05/09/2017   HCVAB NEG 10/29/2008   AMYLASE 41 11/06/2008   LIPASE 19 11/06/2008     Electrolytes Lab Results  Component Value Date   NA 138 10/13/2019   K 4.3 10/13/2019   CL 101 10/13/2019   CALCIUM 9.2 10/13/2019   MG 2.1 04/16/2013   PHOS 2.5 06/13/2010     Bone No results found for: VD25OH, VD125OH2TOT, QZ3007MA2, QJ3354TG2, 25OHVITD1, 25OHVITD2, 25OHVITD3, TESTOFREE, TESTOSTERONE   Inflammation (CRP: Acute Phase) (ESR: Chronic Phase) Lab Results  Component Value Date   CRP 2.7 07/27/2014   ESRSEDRATE 9 07/27/2014       Note: Above Lab results reviewed.   Imaging  DG PAIN CLINIC C-ARM 1-60 MIN NO REPORT Fluoro was used, but no Radiologist interpretation will be provided.  Please refer to "NOTES" tab for provider progress note.  Assessment  The primary encounter diagnosis was Lumbar facet arthropathy. Diagnoses of Lumbar spondylosis and Chronic pain syndrome were also pertinent  to this visit.  Plan of Care   Mr. Wassim Kirksey has a current medication list which includes the following long-term medication(s): fluoxetine, levothyroxine, mirtazapine, and rivaroxaban.  Repeat lumbar radiofrequency ablation starting with the right side first which was previously done on 05/25/2019 and provided greater than 50% pain relief for 4 to 5 months now with gradual return of pain thereafter.  We will plan on doing the left side 2 weeks after the right side.  Risks and benefits of this procedure reviewed and patient would like to proceed.  Instructed patient to stop Xarelto 3 days prior to scheduled procedure.  Patient endorsed understanding.  Orders:  Orders Placed This Encounter  Procedures  . Radiofrequency,Lumbar    Standing Status:   Future    Standing Expiration Date:   02/21/2021    Scheduling Instructions:     Side(s): RIGHT     Level: L3-4, L4-5, Facets ( L3, L4, L5, Medial Branch Nerves)     Sedation: Patient's choice.     Scheduling Timeframe: As soon as pre-approved     Stop Xarelto 3 days prior    Order Specific Question:   Where will this procedure be performed?    Answer:   [facility]  . Radiofrequency,Lumbar    Standing Status:   Future    Standing Expiration Date:   02/21/2021    Scheduling Instructions:     Side(s): Left     Level: L3-4, L4-5, Facets ( L3, L4, L5, Medial Branch Nerves)     Sedation: Patient's choice.     Scheduling Timeframe: 2 weeks  after right     Stop Xarelto 3 days prior    Order Specific Question:   Where will this procedure be performed?    Answer:   [facility]   Follow-up plan:   Return in about 2 weeks (around 03/07/2020) for R L3,4,5 RFA (Xarelto).     Status post right L3, L4, L5 RFA on 10/20/2018, status post left L3, L4, L5 RFA on 02/23/2019.  Helpful in reducing his axial low back pain and improving his functional status.  Can repeat PRN after 6 months. L3/4 ESI 4/5/211 01/11/20 for low back + radiating  hip pain, hip xray and SI joint xray largely unremarkable     Recent Visits Date Type Provider Dept  01/11/20 Procedure visit Gillis Santa, MD [facility]  12/23/19 Telemedicine Gillis Santa, MD [facility]  Showing recent visits within past 90 days and meeting all other requirements Today's Visits Date Type Provider Dept  02/22/20 Telemedicine Gillis Santa, MD [facility]  Showing today's visits and meeting all other requirements Future Appointments No visits were found meeting these conditions. Showing future appointments within next 90 days and meeting all other requirements  I discussed the assessment and treatment plan with the patient. The patient was provided an opportunity to ask questions and all were answered. The patient agreed with the plan and demonstrated an understanding of the instructions.  Patient advised to call back or seek an in-person evaluation if the symptoms or condition worsens.  Duration of encounter: 65mnutes.  Note by: BGillis Santa MD Date: 02/22/2020; Time: 3:36 PM

## 2020-04-20 ENCOUNTER — Encounter: Payer: Self-pay | Admitting: Student in an Organized Health Care Education/Training Program

## 2020-05-04 ENCOUNTER — Ambulatory Visit (HOSPITAL_BASED_OUTPATIENT_CLINIC_OR_DEPARTMENT_OTHER)
Payer: No Typology Code available for payment source | Admitting: Student in an Organized Health Care Education/Training Program

## 2020-05-04 ENCOUNTER — Encounter: Payer: Self-pay | Admitting: Student in an Organized Health Care Education/Training Program

## 2020-05-04 ENCOUNTER — Other Ambulatory Visit: Payer: Self-pay

## 2020-05-04 ENCOUNTER — Ambulatory Visit
Admission: RE | Admit: 2020-05-04 | Discharge: 2020-05-04 | Disposition: A | Discharge status: Home / Self Care | Payer: No Typology Code available for payment source | Source: Ambulatory Visit | Attending: Student in an Organized Health Care Education/Training Program | Admitting: Student in an Organized Health Care Education/Training Program

## 2020-05-04 VITALS — BP 115/65 | HR 69 | Temp 97.7°F | Resp 16 | Ht 70.0 in | Wt 250.0 lb

## 2020-05-04 DIAGNOSIS — M47816 Spondylosis without myelopathy or radiculopathy, lumbar region: Secondary | ICD-10-CM

## 2020-05-04 DIAGNOSIS — G894 Chronic pain syndrome: Secondary | ICD-10-CM | POA: Insufficient documentation

## 2020-05-04 MED ORDER — ROPIVACAINE HCL 2 MG/ML IJ SOLN
9.0000 mL | Freq: Once | INTRAMUSCULAR | Status: AC
  Administered 2020-05-04: 10 mL via PERINEURAL
  Filled 2020-05-04: qty 10

## 2020-05-04 MED ORDER — DEXAMETHASONE SODIUM PHOSPHATE 10 MG/ML IJ SOLN
10.0000 mg | Freq: Once | INTRAMUSCULAR | Status: AC
  Administered 2020-05-04: 10 mg
  Filled 2020-05-04: qty 1

## 2020-05-04 MED ORDER — FENTANYL CITRATE (PF) 100 MCG/2ML IJ SOLN
25.0000 ug | INTRAMUSCULAR | Status: DC | PRN
  Filled 2020-05-04: qty 2

## 2020-05-04 MED ORDER — LIDOCAINE HCL 2 % IJ SOLN
20.0000 mL | Freq: Once | INTRAMUSCULAR | Status: AC
  Administered 2020-05-04: 200 mg
  Filled 2020-05-04: qty 10

## 2020-05-04 NOTE — Progress Notes (Signed)
Safety precautions to be maintained throughout the outpatient stay will include: orient to surroundings, keep bed in low position, maintain call bell within reach at all times, provide assistance with transfer out of bed and ambulation.  

## 2020-05-04 NOTE — Patient Instructions (Addendum)

## 2020-05-04 NOTE — Progress Notes (Signed)
Patient's Name: [name]  MRN: [id number]  Referring Provider: Adin Hector, MD  DOB: [birth year]  PCP: Adin Hector, MD  DOS: 05/04/2020  Note by: Gillis Santa, MD  Service setting: Ambulatory outpatient  Specialty: Interventional Pain Management  Patient type: Established  Location: [facility] (AMB) Pain Management Facility  Visit type: Interventional Procedure   Primary Reason for Visit: Interventional Pain Management Treatment. CC: Back Pain (low and left)  Procedure:          Anesthesia, Analgesia, Anxiolysis:  Type: Thermal Lumbar Facet, Medial Branch Radiofrequency Ablation/Neurotomy  #3 (previously done 06/10/2019) Primary Purpose: Therapeutic Region: Posterolateral Lumbosacral Spine Level:  L3, L4, L5, Medial Branch Level(s). These levels will denervate the L3-4, L4-5,lumbar facet joints. Laterality: Left  Type: Moderate (Conscious) Sedation combined with Local Anesthesia Indication(s): Analgesia and Anxiety Route: Intravenous (IV) IV Access: Secured Sedation: Meaningful verbal contact was maintained at all times during the procedure  Local Anesthetic: Lidocaine 1-2%  Position: Prone   Indications: 1. Lumbar facet arthropathy   2. Lumbar spondylosis   3. Chronic pain syndrome    [name] has been dealing with the above chronic pain for longer than three months and has either failed to respond, was unable to tolerate, or simply did not get enough benefit from other more conservative therapies including, but not limited to: 1. Over-the-counter medications 2. Anti-inflammatory medications 3. Muscle relaxants 4. Membrane stabilizers 5. Opioids 6. Physical therapy and/or chiropractic manipulation 7. Modalities (Heat, ice, etc.) 8. Invasive techniques such as nerve blocks. Mr. Marengo has attained more than 50% relief of the pain from a series of diagnostic injections conducted in separate occasions.  Pain Score: Pre-procedure: 7 /10 Post-procedure: 3  /10  Stopped Xarelto 3 days ago, last dose Sat.  Pre-op Assessment:  [name] is a 54 y.o. (year old), male patient, seen today for interventional treatment. He  has a past surgical history that includes Cardioversion (1989); Cardiac catheterization (03/2010); and Total knee arthroplasty (Left). Mr. Pehl has a current medication list which includes the following prescription(s): allopurinol, atorvastatin, carvedilol, cholecalciferol, docusate sodium, entresto, fluoxetine, hydrocodone-acetaminophen, ibuprofen, krill oil, levothyroxine, meclizine, melatonin, mirtazapine, multivitamin, vitamin c, glucosamine-chondroitin, magnesium, magnesium oxide, and rivaroxaban, and the following Facility-Administered Medications: fentanyl. His primarily concern today is the Back Pain (low and left)  Initial Vital Signs:  Pulse/HCG Rate: 69ECG Heart Rate: 67 (nsr) Temp: 97.7 F (36.5 C) Resp: 18 BP: 115/83 SpO2: 99 %  BMI: Estimated body mass index is 35.87 kg/m as calculated from the following:   Height as of this encounter: 5\' 10"  (1.778 m).   Weight as of this encounter: 250 lb (113.4 kg).  Risk Assessment: Allergies: Reviewed. He has No Known Allergies.  Allergy Precautions: None required Coagulopathies: Reviewed. None identified.  Blood-thinner therapy: None at this time Active Infection(s): Reviewed. None identified. [name] is afebrile  Site Confirmation: [name] was asked to confirm the procedure and laterality before marking the site Procedure checklist: Completed Consent: Before the procedure and under the influence of no sedative(s), amnesic(s), or anxiolytics, the patient was informed of the treatment options, risks and possible complications. To fulfill our ethical and legal obligations, as recommended by the American Medical Association's Code of Ethics, I have informed the patient of my clinical impression; the nature and purpose of the treatment or procedure; the risks,  benefits, and possible complications of the intervention; the alternatives, including doing nothing; the risk(s) and benefit(s) of the alternative treatment(s) or procedure(s); and the  risk(s) and benefit(s) of doing nothing. The patient was provided information about the general risks and possible complications associated with the procedure. These may include, but are not limited to: failure to achieve desired goals, infection, bleeding, organ or nerve damage, allergic reactions, paralysis, and death. In addition, the patient was informed of those risks and complications associated to Spine-related procedures, such as failure to decrease pain; infection (i.e.: Meningitis, epidural or intraspinal abscess); bleeding (i.e.: epidural hematoma, subarachnoid hemorrhage, or any other type of intraspinal or peri-dural bleeding); organ or nerve damage (i.e.: Any type of peripheral nerve, nerve root, or spinal cord injury) with subsequent damage to sensory, motor, and/or autonomic systems, resulting in permanent pain, numbness, and/or weakness of one or several areas of the body; allergic reactions; (i.e.: anaphylactic reaction); and/or death. Furthermore, the patient was informed of those risks and complications associated with the medications. These include, but are not limited to: allergic reactions (i.e.: anaphylactic or anaphylactoid reaction(s)); adrenal axis suppression; blood sugar elevation that in diabetics may result in ketoacidosis or comma; water retention that in patients with history of congestive heart failure may result in shortness of breath, pulmonary edema, and decompensation with resultant heart failure; weight gain; swelling or edema; medication-induced neural toxicity; particulate matter embolism and blood vessel occlusion with resultant organ, and/or nervous system infarction; and/or aseptic necrosis of one or more joints. Finally, the patient was informed that Medicine is not an exact science;  therefore, there is also the possibility of unforeseen or unpredictable risks and/or possible complications that may result in a catastrophic outcome. The patient indicated having understood very clearly. We have given the patient no guarantees and we have made no promises. Enough time was given to the patient to ask questions, all of which were answered to the patient's satisfaction. Mr. Pett has indicated that he wanted to continue with the procedure. Attestation: I, the ordering provider, attest that I have discussed with the patient the benefits, risks, side-effects, alternatives, likelihood of achieving goals, and potential problems during recovery for the procedure that I have provided informed consent. Date  Time:   Pre-Procedure Preparation:  Monitoring: As per clinic protocol. Respiration, ETCO2, SpO2, BP, heart rate and rhythm monitor placed and checked for adequate function Safety Precautions: Patient was assessed for positional comfort and pressure points before starting the procedure. Time-out: I initiated and conducted the "Time-out" before starting the procedure, as per protocol. The patient was asked to participate by confirming the accuracy of the "Time Out" information. Verification of the correct person, site, and procedure were performed and confirmed by me, the nursing staff, and the patient. "Time-out" conducted as per Joint Commission's Universal Protocol (UP.01.01.01). Time: 1014  Description of Procedure:          Laterality: Left Levels: L3, L4, L5,Medial Branch Level(s), at the L3-4, L4-5,  lumbar facet joints. Area Prepped: Lumbosacral Prepping solution: ChloraPrep (2% chlorhexidine gluconate and 70% isopropyl alcohol) Safety Precautions: Aspiration looking for blood return was conducted prior to all injections. At no point did we inject any substances, as a needle was being advanced. Before injecting, the patient was told to immediately notify me if he was experiencing  any new onset of "ringing in the ears, or metallic taste in the mouth". No attempts were made at seeking any paresthesias. Safe injection practices and needle disposal techniques used. Medications properly checked for expiration dates. SDV (single dose vial) medications used. After the completion of the procedure, all disposable equipment used was discarded in the proper designated  medical waste containers. Local Anesthesia: Protocol guidelines were followed. The patient was positioned over the fluoroscopy table. The area was prepped in the usual manner. The time-out was completed. The target area was identified using fluoroscopy. A 12-in long, straight, sterile hemostat was used with fluoroscopic guidance to locate the targets for each level blocked. Once located, the skin was marked with an approved surgical skin marker. Once all sites were marked, the skin (epidermis, dermis, and hypodermis), as well as deeper tissues (fat, connective tissue and muscle) were infiltrated with a small amount of a short-acting local anesthetic, loaded on a 10cc syringe with a 25G, 1.5-in  Needle. An appropriate amount of time was allowed for local anesthetics to take effect before proceeding to the next step. Local Anesthetic: Lidocaine 2.0% The unused portion of the local anesthetic was discarded in the proper designated containers. Technical explanation of process:  Radiofrequency Ablation (RFA)  L3 Medial Branch Nerve RFA: The target area for the L3 medial branch is at the junction of the postero-lateral aspect of the superior articular process and the superior, posterior, and medial edge of the transverse process of L4. Under fluoroscopic guidance, a Radiofrequency needle was inserted until contact was made with os over the superior postero-lateral aspect of the pedicular shadow (target area). Sensory and motor testing was conducted to properly adjust the position of the needle. Once satisfactory placement of the needle  was achieved, the numbing solution was slowly injected after negative aspiration for blood. 18mL of the nerve block solution was injected without difficulty or complication. After waiting for at least 3 minutes, the ablation was performed. Once completed, the needle was removed intact. L4 Medial Branch Nerve RFA: The target area for the L4 medial branch is at the junction of the postero-lateral aspect of the superior articular process and the superior, posterior, and medial edge of the transverse process of L5. Under fluoroscopic guidance, a Radiofrequency needle was inserted until contact was made with os over the superior postero-lateral aspect of the pedicular shadow (target area). Sensory and motor testing was conducted to properly adjust the position of the needle. Once satisfactory placement of the needle was achieved, the numbing solution was slowly injected after negative aspiration for blood. 62mL of the nerve block solution was injected without difficulty or complication. After waiting for at least 3 minutes, the ablation was performed. Once completed, the needle was removed intact. L5 Medial Branch Nerve RFA: The target area for the L5 medial branch is at the junction of the postero-lateral aspect of the superior articular process of S1 and the superior, posterior, and medial edge of the sacral ala. Under fluoroscopic guidance, a Radiofrequency needle was inserted until contact was made with os over the superior postero-lateral aspect of the pedicular shadow (target area). Sensory and motor testing was conducted to properly adjust the position of the needle. Once satisfactory placement of the needle was achieved, the numbing solution was slowly injected after negative aspiration for blood.2 mL of the nerve block solution was injected without difficulty or complication. After waiting for at least 3 minutes, the ablation was performed. Once completed, the needle was removed intact.   Radiofrequency  lesioning (ablation):  Radiofrequency Generator: NeuroTherm [id number] Sensory Stimulation Parameters: 50 Hz was used to locate & identify the nerve, making sure that the needle was positioned such that there was no sensory stimulation below 0.3 V or above 0.7 V. Motor Stimulation Parameters: 2 Hz was used to evaluate the motor component. Care was taken not to  lesion any nerves that demonstrated motor stimulation of the lower extremities at an output of less than 2.5 times that of the sensory threshold, or a maximum of 2.0 V. Lesioning Technique Parameters: Standard Radiofrequency settings. (Not bipolar or pulsed.) Temperature Settings: 80 degrees C Lesioning time: 60 seconds Intra-operative Compliance: Compliant Materials & Medications: Needle(s) (Electrode/Cannula) Type: Teflon-coated, curved tip, Radiofrequency needle(s) Gauge: 22G Length: 10cm Numbing solution: 6 cc solution made of 5 cc of 0.2% ropivacaine, 1 cc of Decadron 10 mg/cc.  2 cc injected at each level above on the right prior to ablation.  The unused portion of the solution was discarded in the proper designated containers.  Once the entire procedure was completed, the treated area was cleaned, making sure to leave some of the prepping solution back to take advantage of its long term bactericidal properties.  Illustration of the posterior view of the lumbar spine and the posterior neural structures. Laminae of L2 through S1 are labeled. DPRL5, dorsal primary ramus of L5; DPRS1, dorsal primary ramus of S1; DPR3, dorsal primary ramus of L3; FJ, facet (zygapophyseal) joint L3-L4; I, inferior articular process of L4; LB1, lateral branch of dorsal primary ramus of L1; IAB, inferior articular branches from L3 medial branch (supplies L4-L5 facet joint); IBP, intermediate branch plexus; MB3, medial branch of dorsal primary ramus of L3; NR3, third lumbar nerve root; S, superior articular process of L5; SAB, superior articular branches from L4  (supplies L4-5 facet joint also); TP3, transverse process of L3.  Vitals:   05/04/20 1018 05/04/20 1023 05/04/20 1028 05/04/20 1041  BP: 107/72 (!) 98/45 (!) 83/54 115/65  Pulse:      Resp: 13 16 15 16   Temp:      TempSrc:      SpO2: 95% 96% 95% 97%  Weight:      Height:        Start Time: 1014 hrs. End Time: 1031 hrs.  Imaging Guidance (Spinal):          Type of Imaging Technique: Fluoroscopy Guidance (Spinal) Indication(s): Assistance in needle guidance and placement for procedures requiring needle placement in or near specific anatomical locations not easily accessible without such assistance. Exposure Time: Please see nurses notes. Contrast: None used. Fluoroscopic Guidance: I was personally present during the use of fluoroscopy. "Tunnel Vision Technique" used to obtain the best possible view of the target area. Parallax error corrected before commencing the procedure. "Direction-depth-direction" technique used to introduce the needle under continuous pulsed fluoroscopy. Once target was reached, antero-posterior, oblique, and lateral fluoroscopic projection used confirm needle placement in all planes. Images permanently stored in EMR. Interpretation: No contrast injected. I personally interpreted the imaging intraoperatively. Adequate needle placement confirmed in multiple planes. Permanent images saved into the patient's record.  Antibiotic Prophylaxis:   Anti-infectives (From admission, onward)   None     Indication(s): None identified  Post-operative Assessment:  Post-procedure Vital Signs:  Pulse/HCG Rate: 6972 Temp: 97.7 F (36.5 C) Resp: 16 BP: 115/65 SpO2: 97 %  EBL: None  Complications: No immediate post-treatment complications observed by team, or reported by patient.  Note: The patient tolerated the entire procedure well. A repeat set of vitals were taken after the procedure and the patient was kept under observation following institutional policy, for this  type of procedure. Post-procedural neurological assessment was performed, showing return to baseline, prior to discharge. The patient was provided with post-procedure discharge instructions, including a section on how to identify potential problems. Should any problems arise concerning this  procedure, the patient was given instructions to immediately contact us, at any time, without hesitation. In any case, we plan to contact the patient by telephone for a follow-up status report regarding this interventional procedure.  Comments:  No additional relevant information.  Plan of Care  Orders:  Orders Placed This Encounter  Procedures  . Radiofrequency,Lumbar    Standing Status:   Future    Standing Expiration Date:   11/01/2020    Scheduling Instructions:     Side(s): RIGHT     Level(s): L3,4,5 MBB     Sedation: With Sedation     Scheduling Timeframe: As soon as pre-approved    Order Specific Question:   Where will this procedure be performed?    Answer:   [facility]  . DG PA[facility] C-ARM 1-60 MIN NO REPORT    Intraoperative interpretation by procedural physician at [facility].    Standing Status:   Standing    Number of Occurrences:   1    Order Specific Question:   Reason for exam:    Answer:   Assistance in needle guidance and placement for procedures requiring needle placement in or near specific anatomical locations not easily accessible without such assistance.   Patient instructed to restart Xarelto tomorrow so long as he is not having any LE weakness.    Medications ordered for procedure: Meds ordered this encounter  Medications  . lidocaine (XYLOCAINE) 2 % (with pres) injection 400 mg  . fentaNYL (SUBLIMAZE) injection 25-50 mcg    Make sure Narcan is available in the pyxis when using this medication. In the event of respiratory depression (RR< 8/min): Titrate NARCAN (naloxone) in increments of 0.1 to 0.2 mg IV at 2-3 minute intervals, until desired degree  of reversal.  . ropivacaine (PF) 2 mg/mL (0.2%) (NAROPIN) injection 9 mL  . dexamethasone (DECADRON) injection 10 mg   Medications administered: We administered lidocaine, ropivacaine (PF) 2 mg/mL (0.2%), and dexamethasone.  See the medical record for exact dosing, route, and time of administration.  Disposition: Discharge home  Discharge Date & Time: 05/04/2020; 1045 hrs.   Follow-up plan:   Return in about 2 weeks (around 05/18/2020) for R L3, 4, 5 RFA , with sedation.     Future Appointments  Date Time Provider [place]  06/01/2020 10:00 AM Gillis Santa, MD [facility] None  06/23/2020  9:15 AM Alfonso Patten, MD [facility] None   Primary Care Physician: Adin Hector, MD Location: [facility] Note by: Gillis Santa, MD Date: 05/04/2020; Time: 11:21 AM  Disclaimer:  Medicine is not an exact science. The only guarantee in medicine is that nothing is guaranteed. It is important to note that the decision to proceed with this intervention was based on the information collected from the patient. The Data and conclusions were drawn from the patient's questionnaire, the interview, and the physical examination. Because the information was provided in large part by the patient, it cannot be guaranteed that it has not been purposely or unconsciously manipulated. Every effort has been made to obtain as much relevant data as possible for this evaluation. It is important to note that the conclusions that lead to this procedure are derived in large part from the available data. Always take into account that the treatment will also be dependent on availability of resources and existing treatment guidelines, considered by other Pain Management Practitioners as being common knowledge and practice, at the time of the intervention. For Medico-Legal purposes,  it is also important to point out that variation in procedural techniques and pharmacological choices are the  acceptable norm. The indications, contraindications, technique, and results of the above procedure should only be interpreted and judged by a Board-Certified Interventional Pain Specialist with extensive familiarity and expertise in the same exact procedure and technique.

## 2020-05-05 ENCOUNTER — Telehealth: Payer: Self-pay

## 2020-05-05 NOTE — Telephone Encounter (Signed)
Ost procedure follow up.  LM

## 2020-05-19 ENCOUNTER — Encounter: Payer: BC Managed Care – PPO | Admitting: Dermatology

## 2020-06-01 ENCOUNTER — Ambulatory Visit
Payer: No Typology Code available for payment source | Admitting: Student in an Organized Health Care Education/Training Program

## 2020-06-20 DIAGNOSIS — R972 Elevated prostate specific antigen [PSA]: Secondary | ICD-10-CM | POA: Insufficient documentation

## 2020-06-22 ENCOUNTER — Ambulatory Visit
Payer: No Typology Code available for payment source | Admitting: Student in an Organized Health Care Education/Training Program

## 2020-06-23 ENCOUNTER — Ambulatory Visit (INDEPENDENT_AMBULATORY_CARE_PROVIDER_SITE_OTHER): Payer: No Typology Code available for payment source | Admitting: Dermatology

## 2020-06-23 ENCOUNTER — Other Ambulatory Visit: Payer: Self-pay

## 2020-06-23 DIAGNOSIS — L570 Actinic keratosis: Secondary | ICD-10-CM

## 2020-06-23 DIAGNOSIS — D1800 Hemangioma unspecified site: Secondary | ICD-10-CM

## 2020-06-23 DIAGNOSIS — Z1283 Encounter for screening for malignant neoplasm of skin: Secondary | ICD-10-CM | POA: Diagnosis not present

## 2020-06-23 DIAGNOSIS — D485 Neoplasm of uncertain behavior of skin: Secondary | ICD-10-CM

## 2020-06-23 DIAGNOSIS — L814 Other melanin hyperpigmentation: Secondary | ICD-10-CM | POA: Diagnosis not present

## 2020-06-23 DIAGNOSIS — L821 Other seborrheic keratosis: Secondary | ICD-10-CM

## 2020-06-23 DIAGNOSIS — C4491 Basal cell carcinoma of skin, unspecified: Secondary | ICD-10-CM

## 2020-06-23 DIAGNOSIS — L578 Other skin changes due to chronic exposure to nonionizing radiation: Secondary | ICD-10-CM

## 2020-06-23 DIAGNOSIS — D229 Melanocytic nevi, unspecified: Secondary | ICD-10-CM | POA: Diagnosis not present

## 2020-06-23 HISTORY — DX: Basal cell carcinoma of skin, unspecified: C44.91

## 2020-06-23 NOTE — Patient Instructions (Addendum)
Cerave AM moisturizer with SPF  Recommend daily broad spectrum sunscreen SPF 30+ to sun-exposed areas, reapply every 2 hours as needed. Call for new or changing lesions.  Recommend taking Heliocare sun protection supplement daily in sunny weather for additional sun protection. For maximum protection on the sunniest days, you can take up to 2 capsules of regular Heliocare OR take 1 capsule of Heliocare Ultra. For prolonged exposure (such as a full day in the sun), you can repeat your dose of the supplement 4 hours after your first dose. Heliocare can be purchased at [facility] or at [email or web address].    Wound Care Instructions  1. Cleanse wound gently with soap and water once a day then pat dry with clean gauze. Apply a thing coat of Petrolatum (petroleum jelly, "Vaseline") over the wound (unless you have an allergy to this). We recommend that you use a new, sterile tube of Vaseline. Do not pick or remove scabs. Do not remove the yellow or white "healing tissue" from the base of the wound.  2. Cover the wound with fresh, clean, nonstick gauze and secure with paper tape. You may use Band-Aids in place of gauze and tape if the would is small enough, but would recommend trimming much of the tape off as there is often too much. Sometimes Band-Aids can irritate the skin.  3. You should call the office for your biopsy report after 1 week if you have not already been contacted.  4. If you experience any problems, such as abnormal amounts of bleeding, swelling, significant bruising, significant pain, or evidence of infection, please call the office immediately.  5. FOR ADULT SURGERY PATIENTS: If you need something for pain relief you may take 1 extra strength Tylenol (acetaminophen) AND 2 Ibuprofen (200mg  each) together every 4 hours as needed for pain. (do not take these if you are allergic to them or if you have a reason you should not take them.) Typically, you may only need pain medication for  1 to 3 days.

## 2020-06-23 NOTE — Progress Notes (Signed)
Follow-Up Visit   Subjective  [name] is a 54 y.o. male who presents for the following: Annual Exam (TSBE today. Hx of AKs. ).  Patient here for full body skin exam and skin cancer screening. Patient has scaly spots on face and arms.  The following portions of the chart were reviewed this encounter and updated as appropriate: Tobacco  Allergies  Meds  Problems  Med Hx  Surg Hx  Fam Hx      Review of Systems: No other skin or systemic complaints except as noted in HPI or Assessment and Plan.   Objective  Well appearing patient in no apparent distress; mood and affect are within normal limits.  A full examination was performed including scalp, head, eyes, ears, nose, lips, neck, chest, axillae, abdomen, back, buttocks, bilateral upper extremities, bilateral lower extremities, hands, feet, fingers, toes, fingernails, and toenails. All findings within normal limits unless otherwise noted below.  Objective  right forearm x 1, left forearm x 1 (2): Erythematous thin papules/macules with gritty scale.   Objective  Upper Mid Back: 0.8 cm firm pink papule R/o BCC vs amelanotic        Assessment & Plan  AK (actinic keratosis) (2) right forearm x 1, left forearm x 1  Prior to procedure, discussed risks of blister formation, small wound, skin dyspigmentation, or rare scar following cryotherapy.    Destruction of lesion - right forearm x 1, left forearm x 1  Destruction method: cryotherapy   Informed consent: discussed and consent obtained   Lesion destroyed using liquid nitrogen: Yes   Cryotherapy cycles:  2 Outcome: patient tolerated procedure well with no complications   Post-procedure details: wound care instructions given    Neoplasm of uncertain behavior of skin Upper Mid Back  Skin / nail biopsy Type of biopsy: punch   Informed consent: discussed and consent obtained   Timeout: patient name, date of birth, surgical site, and procedure verified    Procedure prep:  Patient was prepped and draped in usual sterile fashion Prep type:  Isopropyl alcohol Anesthesia: the lesion was anesthetized in a standard fashion   Anesthetic:  1% lidocaine w/ epinephrine 1-100,000 buffered w/ 8.4% NaHCO3 Punch size:  3 mm Suture size:  3-0 Suture type: Prolene (polypropylene)   Suture removal (days):  14 Hemostasis achieved with: suture   Outcome: patient tolerated procedure well   Post-procedure details: sterile dressing applied and wound care instructions given   Dressing type: bacitracin    Specimen 1 - Surgical pathology Differential Diagnosis: R/o BCC vs amelanotic Check Margins: No 0.8 cm firm pink papule   Lentigines - Scattered tan macules - Discussed due to sun exposure - Benign, observe - Call for any changes  Seborrheic Keratoses - Stuck-on, waxy, tan-brown papules and plaques  - Discussed benign etiology and prognosis. - Observe - Call for any changes  Melanocytic Nevi - Tan-brown and/or pink-flesh-colored symmetric macules and papules - Benign appearing on exam today - Observation - Call clinic for new or changing moles - Recommend daily use of broad spectrum spf 30+ sunscreen to sun-exposed areas.   Hemangiomas - Red papules - Discussed benign nature - Observe - Call for any changes  Actinic Damage - Severe, chronic, secondary to cumulative UV radiation exposure over time - diffuse scaly erythematous macules and papules with underlying dyspigmentation - Discussed "Field Treatment" for Severe, Confluent Actinic Changes with Pre-Cancerous Actinic Keratoses Field treatment involves treatment of an entire area of skin that has confluent Actinic  Changes (Sun/ Ultraviolet light damage) and PreCancerous Actinic Keratoses by method of PhotoDynamic Therapy (PDT) and/or prescription Topical Chemotherapy agents such as 5-fluorouracil, 5-fluorouracil/calcipotriene, and/or imiquimod.  The purpose is to decrease the number of  clinically evident and subclinical PreCancerous lesions to prevent progression to development of skin cancer by chemically destroying early precancer changes that may or may not be visible.  It has been shown to reduce the risk of developing skin cancer in the treated area. As a result of treatment, redness, scaling, crusting, and open sores may occur during treatment course. One or more than one of these methods may be used and may have to be used several times to control, suppress and eliminate the PreCancerous changes. Discussed treatment course, expected reaction, and possible side effects. - Recommend daily broad spectrum sunscreen SPF 30+ to sun-exposed areas, reapply every 2 hours as needed.  - Call for new or changing lesions. - Pt will check with insurance coverage for PDT for face and ears.  We will plan to have that done in a couple of weeks if not too expensive.  If too expensive, we will prescribe 5-fluorouracil calcipotriene  Skin cancer screening performed today.  Return in about 2 weeks (around 07/07/2020) for suture removal and PDT face and ears.   I, Harriett Sine, CMA, am acting as scribe for Forest Gleason, MD.  Documentation: I have reviewed the above documentation for accuracy and completeness, and I agree with the above.  Forest Gleason, MD

## 2020-06-25 ENCOUNTER — Encounter: Payer: Self-pay | Admitting: Dermatology

## 2020-06-28 ENCOUNTER — Encounter: Payer: Self-pay | Admitting: Student in an Organized Health Care Education/Training Program

## 2020-07-06 NOTE — Progress Notes (Signed)
Skin , upper mid back BASAL CELL CARCINOMA, NODULAR PATTERN --> ED&C (cure rate ~85% but no down time, leaves round white scar) vs excision (cure rate 92-93% but need to avoid lifting over 10 lbs, avoid raising blood pressure for 2 weeks after, leaves a long thin line scar).  Dr. Jerilynn Mages left voicemail 07/06/2020  MAs please call starting 12/2, ok to discuss options, Let Dr. Jerilynn Mages know if [name] would like to discuss treatment options

## 2020-07-07 ENCOUNTER — Other Ambulatory Visit: Payer: Self-pay

## 2020-07-07 ENCOUNTER — Ambulatory Visit (INDEPENDENT_AMBULATORY_CARE_PROVIDER_SITE_OTHER): Payer: No Typology Code available for payment source | Admitting: Dermatology

## 2020-07-07 DIAGNOSIS — C4491 Basal cell carcinoma of skin, unspecified: Secondary | ICD-10-CM

## 2020-07-07 DIAGNOSIS — C44519 Basal cell carcinoma of skin of other part of trunk: Secondary | ICD-10-CM

## 2020-07-07 MED ORDER — MUPIROCIN 2 % EX OINT: 1.0000 "application " | TOPICAL_OINTMENT | Freq: Every day | CUTANEOUS | 0 refills | Status: DC

## 2020-07-07 NOTE — Patient Instructions (Signed)

## 2020-07-07 NOTE — Progress Notes (Signed)
   Follow-Up Visit   Subjective  [name] is a 54 y.o. male who presents for the following: Suture / Staple Removal (f/u for suture removal at the upper mid back for biopsy proven BCC. Results given to pt. He would to discuss treatment options further. ).  The following portions of the chart were reviewed this encounter and updated as appropriate:  Tobacco  Allergies  Meds  Problems  Med Hx  Surg Hx  Fam Hx      Review of Systems: No other skin or systemic complaints except as noted in HPI or Assessment and Plan.   Objective  Well appearing patient in no apparent distress; mood and affect are within normal limits.  A focused examination was performed including back. Relevant physical exam findings are noted in the Assessment and Plan.  Objective  Upper Mid Back: Biopsy proven BCC.  Incision site is clean, dry and intact   Assessment & Plan  Basal cell carcinoma (BCC), unspecified site Upper Mid Back  mupirocin ointment (BACTROBAN) 2 %  Wound cleansed, sutures removed, wound cleansed and steri strips applied. Discussed pathology results.  Discussed treatment options EDC vs surgical excision. Pt prefers ED&C.  Apply mupirocin ointment to affected area BID and keep covered with a bandage.   Return in about 1 month (around 08/07/2020) for [facility] and follow up from PDT.   I, Harriett Sine, CMA, am acting as scribe for Forest Gleason, MD.  Documentation: I have reviewed the above documentation for accuracy and completeness, and I agree with the above.  Forest Gleason, MD

## 2020-07-08 ENCOUNTER — Encounter: Payer: Self-pay | Admitting: Student in an Organized Health Care Education/Training Program

## 2020-07-13 ENCOUNTER — Ambulatory Visit (INDEPENDENT_AMBULATORY_CARE_PROVIDER_SITE_OTHER): Payer: No Typology Code available for payment source

## 2020-07-13 ENCOUNTER — Other Ambulatory Visit: Payer: Self-pay

## 2020-07-13 DIAGNOSIS — L570 Actinic keratosis: Secondary | ICD-10-CM

## 2020-07-13 MED ORDER — AMINOLEVULINIC ACID HCL 20 % EX SOLR
1.0000 "application " | Freq: Once | CUTANEOUS | Status: AC
  Administered 2020-07-13: 354 mg via TOPICAL

## 2020-07-13 NOTE — Progress Notes (Signed)
Patient completed PDT therapy today.  1. AK (actinic keratosis) face and ears  Photodynamic therapy - face and ears Procedure discussed: discussed risks, benefits, side effects. and alternatives   Prep: site scrubbed/prepped with acetone   Location:  Face and ears Number of lesions:  Multiple Type of treatment:  Blue light Aminolevulinic Acid (see MAR for details): Levulan Number of Levulan sticks used:  1 Incubation time (minutes):  60 Number of minutes under lamp:  16 Number of seconds under lamp:  40 Cooling:  Floor fan Outcome: patient tolerated procedure well with no complications   Post-procedure details: sunscreen applied    Aminolevulinic Acid HCl 20 % SOLR 354 mg - face and ears

## 2020-07-13 NOTE — Patient Instructions (Signed)

## 2020-07-25 ENCOUNTER — Ambulatory Visit
Payer: No Typology Code available for payment source | Admitting: Student in an Organized Health Care Education/Training Program

## 2020-08-03 ENCOUNTER — Encounter: Payer: Self-pay | Admitting: Dermatology

## 2020-08-15 ENCOUNTER — Encounter: Payer: Self-pay | Admitting: Student in an Organized Health Care Education/Training Program

## 2020-08-16 ENCOUNTER — Other Ambulatory Visit: Payer: Self-pay

## 2020-08-16 ENCOUNTER — Ambulatory Visit (INDEPENDENT_AMBULATORY_CARE_PROVIDER_SITE_OTHER): Payer: BC Managed Care – PPO | Admitting: Internal Medicine

## 2020-08-16 ENCOUNTER — Encounter: Payer: Self-pay | Admitting: Internal Medicine

## 2020-08-16 ENCOUNTER — Encounter: Payer: Self-pay | Admitting: Student in an Organized Health Care Education/Training Program

## 2020-08-16 VITALS — BP 100/64 | HR 69 | Ht 71.0 in | Wt 255.0 lb

## 2020-08-16 DIAGNOSIS — I4891 Unspecified atrial fibrillation: Secondary | ICD-10-CM | POA: Diagnosis not present

## 2020-08-16 DIAGNOSIS — I472 Ventricular tachycardia: Secondary | ICD-10-CM | POA: Diagnosis not present

## 2020-08-16 DIAGNOSIS — I428 Other cardiomyopathies: Secondary | ICD-10-CM | POA: Diagnosis not present

## 2020-08-16 DIAGNOSIS — I493 Ventricular premature depolarization: Secondary | ICD-10-CM | POA: Diagnosis not present

## 2020-08-16 DIAGNOSIS — I4729 Other ventricular tachycardia: Secondary | ICD-10-CM

## 2020-08-16 NOTE — Progress Notes (Signed)
Patient Care Team: Adin Hector, MD as PCP - General (Internal Medicine)   HPI  [name] is a 55 y.o. male Seen in followup for atrial fibrillation that was newly identified 2012.  He was found to have a cardiomyopathy.  Evaluation was also concerning for a bicuspid aortic valve this was subsequently clarified as being trileaflet  The patient denies chest pain , shortness of breath, nocturnal dyspnea, orthopnea or peripheral edema.  There have been no palpitations, lightheadedness or syncope.    DATE TEST EF   2012    Echo   30 %   ? bicuspid  10/13    Echo  30-35 %   trileaflet  10/15    MRI  43 %   No LGE  2/18 Echo    40-45%   3/21 CTA  Ca Score 137 Non obstruc CAD AoV trileaflet   Hx  squamous cell cancer associated with lymphadenopathy.  He underwent radiation and chemotherapy which is left him with some less energy.  Last evaluation there is no visual tumor.  Her first PET scan is due 3/21.  Coronary calcificationwith elevated CaScore on atorva 80    Interval diagnosis of prostate CA , eval at [facility] and [facility] and the decision has been to monitor with PSA Interval skin cancers     Date Cr Hgb TSH LDL   10/18 1.02 13.3    2/19 0.9 11.5  157  12/20 1.0 12.9 5.151   12/21 1.1 12.7 6.99 101   Synthroid increased following 6.99     Past Medical History:  Diagnosis Date  . Anxiety   . Arthritis    gout  . Atrial fibrillation ([place]) 1989  . Basal cell carcinoma 06/23/2020   Upper mid back. Nodular.  . Bicuspid aortic valve    Last echo looks trileaflet  . Cardiomyopathy 08/2009   Mildly decreased EF  . Depression   . GERD (gastroesophageal reflux disease)   . Gout   . 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 Hyperlipidemia   . Hypertension   . Seizures ([place]) 1998   with heat stroke in the [employer] -none since  . throat cancer     Past Surgical History:  Procedure Laterality Date  .  CARDIAC CATHETERIZATION  03/2010   No sig coronary disease, EF ~40%  . CARDIOVERSION  1989  . TOTAL KNEE ARTHROPLASTY Left     Current Outpatient Medications  Medication Sig Dispense Refill  . allopurinol (ZYLOPRIM) 100 MG tablet Take 300 mg by mouth daily.    Marland Kitchen atorvastatin (LIPITOR) 80 MG tablet Take 80 mg by mouth daily.    . carvedilol (COREG) 25 MG tablet 12.5 mg 2 (two) times daily with a meal.     . cholecalciferol (VITAMIN D3) 25 MCG (1000 UT) tablet Take 1,000 Units by mouth 2 (two) times a day.    Mariane Baumgarten Sodium 100 MG capsule Take 100 mg by mouth 2 (two) times daily.    Marland Kitchen ENTRESTO 97-103 MG Take 1 tablet by mouth at bedtime.    Marland Kitchen FLUoxetine (PROZAC) 20 MG capsule Take 1 capsule (20 mg total) by mouth daily. 90 capsule 0  . HYDROcodone-acetaminophen (NORCO/VICODIN) 5-325 MG tablet take 1 to 2 tablets by mouth every 4 hours if needed  0  . Ibuprofen 200 MG CAPS Take 800 mg by mouth 2 (two) times daily as needed.     Marland Kitchen  KRILL OIL ULTRA STRENGTH PO Take 1 capsule by mouth daily.    Marland Kitchen levothyroxine (SYNTHROID) 150 MCG tablet Take 175 mcg by mouth daily before breakfast.     . meclizine (ANTIVERT) 25 MG tablet Take 1 tablet (25 mg total) by mouth 3 (three) times daily as needed for dizziness. 30 tablet 0  . Melatonin 5 MG CAPS Take 10 mg by mouth at bedtime. Takes 2 capsules po at bedtime for sleep.    . mirtazapine (REMERON) 15 MG tablet Take 7.5 mg by mouth daily.    . Multiple Vitamin (MULTIVITAMIN) tablet Take 1 tablet by mouth daily.    . mupirocin ointment (BACTROBAN) 2 % Apply 1 application topically daily. With dressing changes 30 g 0  . rivaroxaban (XARELTO) 20 MG TABS tablet Take 1 tablet (20 mg total) by mouth daily. 30 tablet 5  . vitamin C (ASCORBIC ACID) 500 MG tablet Take 500 mg by mouth daily.      No current facility-administered medications for this visit.    No Known Allergies  Review of Systems negative except from HPI and PMH  Physical Exam BP 100/64    Pulse 69   Ht 5\' 11"  (1.803 m)   Wt 255 lb (115.7 kg)   SpO2 98%   BMI 35.57 kg/m  Well developed and nourished in no acute distress HENT normal Neck supple with JVP-  flat   Clear Regular rate and rhythm, no murmurs or gallops Abd-soft with active BS No Clubbing cyanosis edema Skin-warm and dry A & Oriented  Grossly normal sensory and motor function  ECG  Sinus @ 71 18/09/40    Assessment and  Plan  PVCs  Atrial Fibrillation   Cardiomyopathy--nonischemic  Hyperlipidemia  Cancers interval (As above)       Scant palps; no afib of which he is aware  On statins with significant improvement in LDL  Will recheck next year and consider change to rosuvostatin   On Anticoagulation;  No bleeding issues   Prob needs reassessment of LV function >> guide meds

## 2020-08-16 NOTE — Patient Instructions (Addendum)
Medication Instructions:  Your physician recommends that you continue on your current medications as directed. Please refer to the Current Medication list given to you today.  *If you need a refill on your cardiac medications before your next appointment, please call your pharmacy*   Lab Work: None  If you have labs (blood work) drawn today and your tests are completely normal, you will receive your results only by: Marland Kitchen MyChart Message (if you have MyChart) OR . A paper copy in the mail If you have any lab test that is abnormal or we need to change your treatment, we will call you to review the results.   Testing/Procedures: Your physician has requested that you have an echocardiogram. Echocardiography is a painless test that uses sound waves to create images of your heart. It provides your doctor with information about the size and shape of your heart and how well your heart's chambers and valves are working. This procedure takes approximately one hour. There are no restrictions for this procedure.   Follow-Up: At [facility], you and your health needs are our priority.  As part of our continuing mission to provide you with exceptional heart care, we have created designated Provider Care Teams.  These Care Teams include your primary Cardiologist (physician) and Advanced Practice Providers (APPs -  Physician Assistants and Nurse Practitioners) who all work together to provide you with the care you need, when you need it.  We recommend signing up for the patient portal called "MyChart".  Sign up information is provided on this After Visit Summary.  MyChart is used to connect with patients for Virtual Visits (Telemedicine).  Patients are able to view lab/test results, encounter notes, upcoming appointments, etc.  Non-urgent messages can be sent to your provider as well.   To learn more about what you can do with MyChart, go to [email or web address].    Your next appointment:   12  month(s)  The format for your next appointment:   In Person  Provider:   Virl Axe, MD

## 2020-08-19 ENCOUNTER — Telehealth: Payer: Self-pay

## 2020-08-19 NOTE — Telephone Encounter (Signed)
Patient returning sharon's call, was in chart looking for note. Confirmed echo set for 1/28 (reason for sharon's call)

## 2020-08-23 ENCOUNTER — Ambulatory Visit: Payer: No Typology Code available for payment source | Admitting: Dermatology

## 2020-08-25 ENCOUNTER — Other Ambulatory Visit: Payer: Self-pay

## 2020-08-25 ENCOUNTER — Ambulatory Visit (INDEPENDENT_AMBULATORY_CARE_PROVIDER_SITE_OTHER): Payer: BC Managed Care – PPO | Admitting: Dermatology

## 2020-08-25 DIAGNOSIS — L578 Other skin changes due to chronic exposure to nonionizing radiation: Secondary | ICD-10-CM | POA: Diagnosis not present

## 2020-08-25 DIAGNOSIS — C44519 Basal cell carcinoma of skin of other part of trunk: Secondary | ICD-10-CM

## 2020-08-25 DIAGNOSIS — L821 Other seborrheic keratosis: Secondary | ICD-10-CM | POA: Diagnosis not present

## 2020-08-25 NOTE — Patient Instructions (Addendum)
Wound Care Instructions  1. Cleanse wound gently with soap and water once a day then pat dry with clean gauze. Apply a thing coat of Petrolatum (petroleum jelly, "Vaseline") over the wound (unless you have an allergy to this). We recommend that you use a new, sterile tube of Vaseline. Do not pick or remove scabs. Do not remove the yellow or white "healing tissue" from the base of the wound.  2. Cover the wound with fresh, clean, nonstick gauze and secure with paper tape. You may use Band-Aids in place of gauze and tape if the would is small enough, but would recommend trimming much of the tape off as there is often too much. Sometimes Band-Aids can irritate the skin.  3. You should call the office for your biopsy report after 1 week if you have not already been contacted.  4. If you experience any problems, such as abnormal amounts of bleeding, swelling, significant bruising, significant pain, or evidence of infection, please call the office immediately.   Recommend Nicotinamide 500mg  twice per day to lower risk of non-melanoma skin cancer by approximately 25%.   Recommend taking Heliocare sun protection supplement daily in sunny weather for additional sun protection. For maximum protection on the sunniest days, you can take up to 2 capsules of regular Heliocare OR take 1 capsule of Heliocare Ultra. For prolonged exposure (such as a full day in the sun), you can repeat your dose of the supplement 4 hours after your first dose. Heliocare can be purchased at [facility] or at [email or web address].    Melanoma ABCDEs  Melanoma is the most dangerous type of skin cancer, and is the leading cause of death from skin disease.  You are more likely to develop melanoma if you:  Have light-colored skin, light-colored eyes, or red or blond hair  Spend a lot of time in the sun  Tan regularly, either outdoors or in a tanning bed  Have had blistering sunburns, especially during childhood  Have a  close family member who has had a melanoma  Have atypical moles or large birthmarks  Early detection of melanoma is key since treatment is typically straightforward and cure rates are extremely high if we catch it early.   The first sign of melanoma is often a change in a mole or a new dark spot.  The ABCDE system is a way of remembering the signs of melanoma.  A for asymmetry:  The two halves do not match. B for border:  The edges of the growth are irregular. C for color:  A mixture of colors are present instead of an even brown color. D for diameter:  Melanomas are usually (but not always) greater than 3mm - the size of a pencil eraser. E for evolution:  The spot keeps changing in size, shape, and color.  Please check your skin once per month between visits. You can use a small mirror in front and a large mirror behind you to keep an eye on the back side or your body.   If you see any new or changing lesions before your next follow-up, please call to schedule a visit.  Please continue daily skin protection including broad spectrum sunscreen SPF 30+ to sun-exposed areas, reapplying every 2 hours as needed when you're outdoors.

## 2020-08-25 NOTE — Progress Notes (Signed)
   Follow-Up Visit   Subjective  [name] is a 55 y.o. male who presents for the following: Procedure (Patient here today to treat bx proven BCC at upper mid back with [facility]. Also following up for AK's s/p PDT. ).  The following portions of the chart were reviewed this encounter and updated as appropriate:   Tobacco  Allergies  Meds  Problems  Med Hx  Surg Hx  Fam Hx      Review of Systems:  No other skin or systemic complaints except as noted in HPI or Assessment and Plan.  Objective  Well appearing patient in no apparent distress; mood and affect are within normal limits.  A focused examination was performed including face, neck, chest and back. Relevant physical exam findings are noted in the Assessment and Plan.  Objective  Upper Mid Back: Pink plaque   Assessment & Plan  Basal cell carcinoma (BCC) of skin of other part of torso Upper Mid Back  Destruction of lesion  Destruction method: electrodesiccation and curettage   Informed consent: discussed and consent obtained   Timeout:  patient name, date of birth, surgical site, and procedure verified Patient was prepped and draped in usual sterile fashion: area prepped with isopropyl alcohol. Anesthesia: the lesion was anesthetized in a standard fashion   Anesthetic:  1% lidocaine w/ epinephrine 1-100,000 buffered w/ 8.4% NaHCO3 Curettage performed in three different directions: Yes   Electrodesiccation performed over the curetted area: Yes   Curettage cycles:  3 Final wound size (cm):  2.2 Hemostasis achieved with:  electrodesiccation Outcome: patient tolerated procedure well with no complications   Post-procedure details: wound care instructions given   Additional details:  Mupirocin and a pressure dressing applied  Seborrheic Keratoses - Stuck-on, waxy, tan-brown papules and plaques  - Discussed benign etiology and prognosis. - Observe - Call for any changes  Actinic Damage - Severe, chronic,  secondary to cumulative UV radiation exposure over time - diffuse scaly erythematous macules and papules with underlying dyspigmentation - Discussed Prescription "Field Treatment" for Severe, Chronic Confluent Actinic Changes with Pre-Cancerous Actinic Keratoses Field treatment involves treatment of an entire area of skin that has confluent Actinic Changes (Sun/ Ultraviolet light damage) and PreCancerous Actinic Keratoses by method of PhotoDynamic Therapy (PDT) and/or prescription Topical Chemotherapy agents such as 5-fluorouracil, 5-fluorouracil/calcipotriene, and/or imiquimod.  The purpose is to decrease the number of clinically evident and subclinical PreCancerous lesions to prevent progression to development of skin cancer by chemically destroying early precancer changes that may or may not be visible.  It has been shown to reduce the risk of developing skin cancer in the treated area. As a result of treatment, redness, scaling, crusting, and open sores may occur during treatment course. One or more than one of these methods may be used and may have to be used several times to control, suppress and eliminate the PreCancerous changes. Discussed treatment course, expected reaction, and possible side effects. - Recommend daily broad spectrum sunscreen SPF 30+ to sun-exposed areas, reapply every 2 hours as needed.  - Call for new or changing lesions. - Will schedule prescription photodynamic therapy to the face and ears  Return for PDT to face and ears, TBSE/AK follow up in May.  Graciella Belton, RMA, am acting as scribe for Forest Gleason, MD .  Documentation: I have reviewed the above documentation for accuracy and completeness, and I agree with the above.  Forest Gleason, MD

## 2020-08-29 ENCOUNTER — Encounter: Payer: Self-pay | Admitting: Dermatology

## 2020-09-02 ENCOUNTER — Ambulatory Visit (INDEPENDENT_AMBULATORY_CARE_PROVIDER_SITE_OTHER): Payer: BC Managed Care – PPO

## 2020-09-02 ENCOUNTER — Other Ambulatory Visit: Payer: Self-pay

## 2020-09-02 DIAGNOSIS — I4891 Unspecified atrial fibrillation: Secondary | ICD-10-CM | POA: Diagnosis not present

## 2020-09-02 DIAGNOSIS — I428 Other cardiomyopathies: Secondary | ICD-10-CM

## 2020-09-02 DIAGNOSIS — I493 Ventricular premature depolarization: Secondary | ICD-10-CM

## 2020-09-02 LAB — ECHOCARDIOGRAM COMPLETE
AR max vel: 3.48 cm2
AV Area VTI: 3.67 cm2
AV Area mean vel: 3.39 cm2
AV Mean grad: 5 mmHg
AV Peak grad: 7.7 mmHg
Ao pk vel: 1.39 m/s
Area-P 1/2: 4.52 cm2
S' Lateral: 4.4 cm
Single Plane A4C EF: 39.6 %

## 2020-09-02 MED ORDER — PERFLUTREN LIPID MICROSPHERE
1.0000 mL | INTRAVENOUS | Status: AC | PRN
  Administered 2020-09-02: 2 mL via INTRAVENOUS

## 2020-09-09 ENCOUNTER — Telehealth: Payer: Self-pay

## 2020-09-09 NOTE — Telephone Encounter (Signed)
Attempted phone call to pt and left message to contact RN at [phone]. 

## 2020-09-09 NOTE — Telephone Encounter (Signed)
-----   Message from Deboraha Sprang, MD sent at 09/07/2020  8:07 PM EST ----- Please Inform Patient Echo showed  mildly worsening heart muscle function about 30-35% but there has been some variability in the past.   Probably should be thinking of two new meds for his cardiomyopathy, 1) spiro 25 mg daily with BMET in 2 weeks and then 2) we can have him see one of PAs about 4 weeks to start SGLT 2    Thanks SK

## 2020-09-12 ENCOUNTER — Other Ambulatory Visit: Payer: Self-pay

## 2020-09-12 ENCOUNTER — Ambulatory Visit (INDEPENDENT_AMBULATORY_CARE_PROVIDER_SITE_OTHER): Payer: BC Managed Care – PPO

## 2020-09-12 DIAGNOSIS — L570 Actinic keratosis: Secondary | ICD-10-CM

## 2020-09-12 MED ORDER — AMINOLEVULINIC ACID HCL 20 % EX SOLR
1.0000 "application " | Freq: Once | CUTANEOUS | Status: AC
  Administered 2020-09-12: 354 mg via TOPICAL

## 2020-09-12 NOTE — Patient Instructions (Signed)

## 2020-09-12 NOTE — Progress Notes (Signed)
1. AK (actinic keratosis) face and ears  Photodynamic therapy - face and ears Procedure discussed: discussed risks, benefits, side effects. and alternatives   Prep: site scrubbed/prepped with acetone   Location:  Face and ears Number of lesions:  Multiple Type of treatment:  Blue light Aminolevulinic Acid (see MAR for details): Levulan Number of Levulan sticks used:  1 Incubation time (minutes):  60 Number of minutes under lamp:  16 Number of seconds under lamp:  40 Cooling:  Floor fan Outcome: patient tolerated procedure well with no complications   Post-procedure details: sunscreen applied and aftercare instructions given to patient    Aminolevulinic Acid HCl 20 % SOLR 354 mg - face and ears

## 2020-09-21 MED ORDER — SPIRONOLACTONE 25 MG PO TABS: 25.0000 mg | ORAL_TABLET | Freq: Every day | ORAL | 3 refills | Status: DC

## 2020-09-21 NOTE — Telephone Encounter (Signed)
Spoke with pt and advised per Dr Caryl Comes echo shows mild worsening of heart muscle function at about 30-35% but there has been some variability of function in the past.  Pt should consider starting 2 new medications for CM.  Pt should begin Spironolactone 25mg  - 1 tablet by mouth daily.  Appointment scheduled with Oda Kilts 10/07/2020  to start SGLT 2 Inhibitor and to check BMET.  Rx sent to pharmacy on file in [place].  Pt verbalizes understanding and agrees with current plan.

## 2020-09-28 ENCOUNTER — Ambulatory Visit
Payer: No Typology Code available for payment source | Admitting: Student in an Organized Health Care Education/Training Program

## 2020-10-03 ENCOUNTER — Ambulatory Visit: Payer: BC Managed Care – PPO | Admitting: Student in an Organized Health Care Education/Training Program

## 2020-10-06 ENCOUNTER — Telehealth: Payer: Self-pay

## 2020-10-06 ENCOUNTER — Telehealth (INDEPENDENT_AMBULATORY_CARE_PROVIDER_SITE_OTHER): Payer: BC Managed Care – PPO | Admitting: Internal Medicine

## 2020-10-06 ENCOUNTER — Other Ambulatory Visit: Payer: Self-pay

## 2020-10-06 VITALS — BP 125/70 | HR 67 | Ht 71.0 in | Wt 250.0 lb

## 2020-10-06 DIAGNOSIS — I4729 Other ventricular tachycardia: Secondary | ICD-10-CM

## 2020-10-06 DIAGNOSIS — I4891 Unspecified atrial fibrillation: Secondary | ICD-10-CM

## 2020-10-06 DIAGNOSIS — I428 Other cardiomyopathies: Secondary | ICD-10-CM

## 2020-10-06 DIAGNOSIS — I472 Ventricular tachycardia: Secondary | ICD-10-CM

## 2020-10-06 NOTE — Patient Instructions (Signed)
Medication Instructions:  Your physician recommends that you continue on your current medications as directed. Please refer to the Current Medication list given to you today.  *If you need a refill on your cardiac medications before your next appointment, please call your pharmacy*   Lab Work: None ordered.  If you have labs (blood work) drawn today and your tests are completely normal, you will receive your results only by: Marland Kitchen MyChart Message (if you have MyChart) OR . A paper copy in the mail If you have any lab test that is abnormal or we need to change your treatment, we will call you to review the results.   Testing/Procedures: None ordered.  Follow Up as scheduled

## 2020-10-06 NOTE — Telephone Encounter (Signed)
  Patient Consent for Virtual Visit         [name] has provided verbal consent on 10/06/2020 for a virtual visit (video or telephone).   CONSENT FOR VIRTUAL VISIT FOR:  [name]  By participating in this virtual visit I agree to the following:  I hereby voluntarily request, consent and authorize [facility] and its employed or contracted physicians, physician assistants, nurse practitioners or other licensed health care professionals (the Practitioner), to provide me with telemedicine health care services (the "Services") as deemed necessary by the treating Practitioner. I acknowledge and consent to receive the Services by the Practitioner via telemedicine. I understand that the telemedicine visit will involve communicating with the Practitioner through live audiovisual communication technology and the disclosure of certain medical information by electronic transmission. I acknowledge that I have been given the opportunity to request an in-person assessment or other available alternative prior to the telemedicine visit and am voluntarily participating in the telemedicine visit.  I understand that I have the right to withhold or withdraw my consent to the use of telemedicine in the course of my care at any time, without affecting my right to future care or treatment, and that the Practitioner or I may terminate the telemedicine visit at any time. I understand that I have the right to inspect all information obtained and/or recorded in the course of the telemedicine visit and may receive copies of available information for a reasonable fee.  I understand that some of the potential risks of receiving the Services via telemedicine include:  Marland Kitchen Delay or interruption in medical evaluation due to technological equipment failure or disruption; . Information transmitted may not be sufficient (e.g. poor resolution of images) to allow for appropriate medical decision making by the  Practitioner; and/or  . In rare instances, security protocols could fail, causing a breach of personal health information.  Furthermore, I acknowledge that it is my responsibility to provide information about my medical history, conditions and care that is complete and accurate to the best of my ability. I acknowledge that Practitioner's advice, recommendations, and/or decision may be based on factors not within their control, such as incomplete or inaccurate data provided by me or distortions of diagnostic images or specimens that may result from electronic transmissions. I understand that the practice of medicine is not an exact science and that Practitioner makes no warranties or guarantees regarding treatment outcomes. I acknowledge that a copy of this consent can be made available to me via my patient portal ([facility]), or I can request a printed copy by calling the office of [facility].    I understand that my insurance will be billed for this visit.   I have read or had this consent read to me. . I understand the contents of this consent, which adequately explains the benefits and risks of the Services being provided via telemedicine.  . I have been provided ample opportunity to ask questions regarding this consent and the Services and have had my questions answered to my satisfaction. . I give my informed consent for the services to be provided through the use of telemedicine in my medical care

## 2020-10-06 NOTE — Progress Notes (Signed)
Electrophysiology TeleHealth Note   Due to national recommendations of social distancing due to COVID 19, an audio/video telehealth visit is felt to be most appropriate for this patient at this time.  See MyChart message from today for the patient's consent to telehealth for [facility].   Date:  10/06/2020   ID:  [name], DOB 1966-04-06, MRN [id number]  Location: patient's home  Provider location: [address] [place]  Evaluation Performed: Follow-up visit  PCP:  Adin Hector, MD  Cardiologist:    [place] Electrophysiologist:  [facility]   Chief Complaint: Cardiomyopathy  History of Present Illness:    [name] is a 55 y.o. male who presents via audio/video conferencing for a telehealth visit today.  Since last being seen in our clinic for atrial fib, NICM with recent deterioration and chronic mild systolic heart failure  the patient reports doing pretty well, mild SOB no chest pain, palps or edema  Echo >> decrease EF compared to prior studies and so recommended initiation of SGLT2;  [facility] this appt to discuss the procedures risks and benefits    Test EF   2011 TEE 30%   8/11 LHC  No Obst CAD  2012 Echo   30 %   ? bicuspid  10/13 Echo  30-35 %   trileaflet  10/15 MRI  43 %   No LGE  2/18 Echo    40-45%   3/21 CTA  Ca Score 137 Non obstruc CAD AoV trileaflet  1/22 Echo  30-35%      Hx  squamous cell cancer associated with lymphadenopathy.  He underwent radiation and chemotherapy which is left him with some less energy.  Last evaluation there is no visual tumor.   Holding up ok --feels like things are under control  With good insurance coverage   The patient denies symptoms of fevers, chills, cough, or new SOB worrisome for COVID 19.   Past Medical History:  Diagnosis Date  . Anxiety   . Arthritis    gout  . Atrial fibrillation ([place]) 1989  . Basal cell carcinoma 06/23/2020   Upper mid  back. Nodular.  . Bicuspid aortic valve    Last echo looks trileaflet  . Cardiomyopathy 08/2009   Mildly decreased EF  . Depression   . GERD (gastroesophageal reflux disease)   . Gout   . 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 Hyperlipidemia   . Hypertension   . Seizures ([place]) 1998   with heat stroke in the [employer] -none since  . throat cancer     Past Surgical History:  Procedure Laterality Date  . CARDIAC CATHETERIZATION  03/2010   No sig coronary disease, EF ~40%  . CARDIOVERSION  1989  . TOTAL KNEE ARTHROPLASTY Left     Current Outpatient Medications  Medication Sig Dispense Refill  . allopurinol (ZYLOPRIM) 100 MG tablet Take 300 mg by mouth daily.    Marland Kitchen atorvastatin (LIPITOR) 80 MG tablet Take 80 mg by mouth daily.    . carvedilol (COREG) 25 MG tablet 12.5 mg 2 (two) times daily with a meal.     . cholecalciferol (VITAMIN D3) 25 MCG (1000 UT) tablet Take 1,000 Units by mouth 2 (two) times a day.    Mariane Baumgarten Sodium 100 MG capsule Take 100 mg by mouth 2 (two) times daily.    Marland Kitchen  ENTRESTO 97-103 MG Take 1 tablet by mouth at bedtime.    Marland Kitchen FLUoxetine (PROZAC) 20 MG capsule Take 1 capsule (20 mg total) by mouth daily. 90 capsule 0  . HYDROcodone-acetaminophen (NORCO/VICODIN) 5-325 MG tablet take 1 to 2 tablets by mouth every 4 hours if needed  0  . Ibuprofen 200 MG CAPS Take 800 mg by mouth 2 (two) times daily as needed.     Marland Kitchen levothyroxine (SYNTHROID) 150 MCG tablet Take 175 mcg by mouth daily before breakfast.     . meclizine (ANTIVERT) 25 MG tablet Take 1 tablet (25 mg total) by mouth 3 (three) times daily as needed for dizziness. 30 tablet 0  . Melatonin 5 MG CAPS Take 10 mg by mouth at bedtime. Takes 2 capsules po at bedtime for sleep.    . mirtazapine (REMERON) 15 MG tablet Take 7.5 mg by mouth daily.    . Multiple Vitamin (MULTIVITAMIN) tablet Take 1 tablet by mouth daily.    . mupirocin ointment (BACTROBAN) 2 %  Apply 1 application topically daily. With dressing changes 30 g 0  . rivaroxaban (XARELTO) 20 MG TABS tablet Take 1 tablet (20 mg total) by mouth daily. 30 tablet 5  . spironolactone (ALDACTONE) 25 MG tablet Take 1 tablet (25 mg total) by mouth daily. 90 tablet 3  . vitamin C (ASCORBIC ACID) 500 MG tablet Take 500 mg by mouth daily.     Marland Kitchen KRILL OIL ULTRA STRENGTH PO Take 1 capsule by mouth daily. (Patient not taking: Reported on 10/06/2020)     No current facility-administered medications for this visit.    Allergies:   Patient has no known allergies.   Social History:  The patient  reports that he has never smoked. He has never used smokeless tobacco. He reports current alcohol use of about 2.0 - 3.0 standard drinks of alcohol per week. He reports that he does not use drugs.   Family History:  The patient's   family history includes Arthritis in his mother; COPD in his mother; Coronary artery disease in his paternal grandfather; Crohn's disease in his brother; Depression in his mother; Diabetes in his mother; Heart attack in his father; Lung cancer in his maternal grandfather, paternal uncle, and paternal uncle; Pancreatic cancer in his paternal uncle; Thyroid cancer in his paternal aunt.   ROS:  Please see the history of present illness.   All other systems are personally reviewed and negative.    Exam:    Vital Signs:  BP 125/70   Pulse 67   Ht 5\' 11"  (1.803 m)   Wt 250 lb (113.4 kg)   BMI 34.87 kg/m     Well appearing, alert and conversant, regular work of breathing,  good skin color Eyes- anicteric, neuro- grossly intact, skin- no apparent rash or lesions or cyanosis, mouth- oral mucosa is pink   Labs/Other Tests and Data Reviewed:    Recent Labs: 10/13/2019: BUN 19; Creatinine, Ser 1.08; Potassium 4.3; Sodium 138   Wt Readings from Last 3 Encounters:  10/06/20 250 lb (113.4 kg)  08/16/20 255 lb (115.7 kg)  05/04/20 250 lb (113.4 kg)     Other studies personally  reviewed: Additional studies/ records that were reviewed today include: [facility] records  Review of the above records today demonstrates:   Prior radiographs:      ASSESSMENT & PLAN:    PVCs  Atrial Fibrillation   Cardiomyopathy--nonischemic  Hyperlipidemia  Cancer Squamous cell Rx @ [facility]   Discussion regarding cath, risks  and benefits.  Discussed the former in light of what I think is a low yield test, given known NICM from the beginning and recent neg CTA- He is currently on entresto but not yet on SGLT-2-- he will be able to get these from the [place]  No interval AFib of which he is aware; no bleeding on Rivaroxaban     COVID 19 screen The patient denies symptoms of COVID 19 at this time.  The importance of social distancing was discussed today.  Follow-up:tomorrow with AT   Current medicines are reviewed at length with the patient today.   The patient does not have concerns regarding his medicines.  The following changes were made today:  none But he will begin SGLT-2 following cath Labs/ tests ordered today include:   No orders of the defined types were placed in this encounter.   Future tests ( post COVID )     Patient Risk:  after full review of this patients clinical status, I feel that they are at moderate  risk at this time.  Today, I have spent  101minutes with the patient with telehealth technology discussing the above.  Signed, Virl Axe, MD  10/06/2020 4:07 PM     [address] [place] [place] [address] [phone] (office) [phone] (fax)

## 2020-10-07 ENCOUNTER — Ambulatory Visit: Payer: BC Managed Care – PPO | Admitting: Student

## 2020-10-07 ENCOUNTER — Other Ambulatory Visit: Payer: Self-pay

## 2020-10-07 ENCOUNTER — Encounter: Payer: Self-pay | Admitting: Student

## 2020-10-07 VITALS — BP 124/68 | HR 71 | Ht 71.0 in | Wt 250.0 lb

## 2020-10-07 DIAGNOSIS — I4891 Unspecified atrial fibrillation: Secondary | ICD-10-CM

## 2020-10-07 DIAGNOSIS — I5022 Chronic systolic (congestive) heart failure: Secondary | ICD-10-CM | POA: Diagnosis not present

## 2020-10-07 DIAGNOSIS — I428 Other cardiomyopathies: Secondary | ICD-10-CM | POA: Diagnosis not present

## 2020-10-07 DIAGNOSIS — I493 Ventricular premature depolarization: Secondary | ICD-10-CM | POA: Diagnosis not present

## 2020-10-07 MED ORDER — DAPAGLIFLOZIN PROPANEDIOL 10 MG PO TABS: 10.0000 mg | ORAL_TABLET | Freq: Every day | ORAL | 2 refills | Status: DC

## 2020-10-07 NOTE — Patient Instructions (Signed)
Medication Instructions:  Your physician has recommended you make the following change in your medication:   START: Farxiga 10mg  daily  *If you need a refill on your cardiac medications before your next appointment, please call your pharmacy*   Lab Work: TODAY: BMET  If you have labs (blood work) drawn today and your tests are completely normal, you will receive your results only by: Marland Kitchen MyChart Message (if you have MyChart) OR . A paper copy in the mail If you have any lab test that is abnormal or we need to change your treatment, we will call you to review the results.   Follow-Up: At [facility], you and your health needs are our priority.  As part of our continuing mission to provide you with exceptional heart care, we have created designated Provider Care Teams.  These Care Teams include your primary Cardiologist (physician) and Advanced Practice Providers (APPs -  Physician Assistants and Nurse Practitioners) who all work together to provide you with the care you need, when you need it.  Your next appointment:   4 month(s)  The format for your next appointment:   In Person  Provider:   You may see Virl Axe, MD or one of the following Advanced Practice Providers on your designated Care Team:    Chanetta Marshall, NP  Tommye Standard, PA-C  Legrand Como "[place]" [place], [place]

## 2020-10-07 NOTE — Progress Notes (Signed)
PCP:  Adin Hector, MD Primary Cardiologist: No primary care provider on file. Electrophysiologist: Virl Axe, MD   [name] is a 55 y.o. male seen today for Virl Axe, MD for routine electrophysiology followup.  Since last being seen in our clinic the patient reports doing well. He discussed the risks and benefits of cath yesterday with Dr. Caryl Comes in a virtual visit. He is doing well overall. He denies any limitation from fatigue, SOB, lightheadedness, or dizziness.  He has occasional chest discomfort that is possible related to food (is at times accompanied by gas). No exertional component.    Past Medical History:  Diagnosis Date  . Anxiety   . Arthritis    gout  . Atrial fibrillation ([place]) 1989  . Basal cell carcinoma 06/23/2020   Upper mid back. Nodular.  . Bicuspid aortic valve    Last echo looks trileaflet  . Cardiomyopathy 08/2009   Mildly decreased EF  . Depression   . GERD (gastroesophageal reflux disease)   . Gout   . 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 Hyperlipidemia   . Hypertension   . Seizures ([place]) 1998   with heat stroke in the [employer] -none since  . throat cancer    Past Surgical History:  Procedure Laterality Date  . CARDIAC CATHETERIZATION  03/2010   No sig coronary disease, EF ~40%  . CARDIOVERSION  1989  . TOTAL KNEE ARTHROPLASTY Left     Current Outpatient Medications  Medication Sig Dispense Refill  . allopurinol (ZYLOPRIM) 300 MG tablet Take 300 mg by mouth daily.    Marland Kitchen amoxicillin (AMOXIL) 500 MG capsule Take 500 mg by mouth in the morning and at bedtime.    Marland Kitchen atorvastatin (LIPITOR) 80 MG tablet Take 80 mg by mouth daily.    . Carboxymethylcellulose Sod PF (THERATEARS PF) 0.25 % SOLN as needed.    . carvedilol (COREG) 25 MG tablet 12.5 mg 2 (two) times daily with a meal.     . cholecalciferol (VITAMIN D3) 25 MCG (1000 UT) tablet Take 1,000 Units by mouth 2 (two)  times a day.    . colchicine 0.6 MG tablet Take 1 tablet by mouth daily.    . cyclobenzaprine (FLEXERIL) 10 MG tablet Take by mouth daily.    . dapagliflozin propanediol (FARXIGA) 10 MG TABS tablet Take 1 tablet (10 mg total) by mouth daily before breakfast. 90 tablet 2  . Docusate Sodium 100 MG capsule Take 100 mg by mouth 2 (two) times daily.    Marland Kitchen ENTRESTO 97-103 MG Take 1 tablet by mouth at bedtime.    Marland Kitchen FLUoxetine (PROZAC) 20 MG capsule Take 1 capsule (20 mg total) by mouth daily. 90 capsule 0  . HYDROcodone-acetaminophen (NORCO/VICODIN) 5-325 MG tablet take 1 to 2 tablets by mouth every 4 hours if needed  0  . Ibuprofen 200 MG CAPS Take 800 mg by mouth 2 (two) times daily as needed.     Marland Kitchen levothyroxine (SYNTHROID) 200 MCG tablet daily.    . meclizine (ANTIVERT) 25 MG tablet Take 1 tablet (25 mg total) by mouth 3 (three) times daily as needed for dizziness. 30 tablet 0  . Melatonin 5 MG CAPS Take 10 mg by mouth at bedtime. Takes 2 capsules po at bedtime for sleep.    . mirtazapine (REMERON) 15 MG tablet Take 7.5 mg by mouth daily.    . Multiple Vitamin (  MULTIVITAMIN) tablet Take 1 tablet by mouth daily.    . mupirocin ointment (BACTROBAN) 2 % Apply 1 application topically daily. With dressing changes 30 g 0  . Omega-3 Fatty Acids (FISH OIL) 600 MG CAPS Take by mouth daily.    . rivaroxaban (XARELTO) 20 MG TABS tablet Take 1 tablet (20 mg total) by mouth daily. 30 tablet 5  . spironolactone (ALDACTONE) 25 MG tablet Take 1 tablet (25 mg total) by mouth daily. 90 tablet 3  . vitamin C (ASCORBIC ACID) 500 MG tablet Take 500 mg by mouth daily.      No current facility-administered medications for this visit.    No Known Allergies  Social History   Socioeconomic History  . Marital status: Married    Spouse name: Not on file  . Number of children: 1  . Years of education: Not on file  . Highest education level: Not on file  Occupational History  . Occupation: Sales  Tobacco Use  .  Smoking status: Never Smoker  . Smokeless tobacco: Never Used  Vaping Use  . Vaping Use: Never used  Substance and Sexual Activity  . Alcohol use: Yes    Alcohol/week: 2.0 - 3.0 standard drinks    Types: 2 - 3 Cans of beer per week    Comment: occassional  . Drug use: No  . Sexual activity: Not on file  Other Topics Concern  . Not on file  Social History Narrative   Married with 1 son   Social Determinants of Health   Financial Resource Strain: Not on file  Food Insecurity: Not on file  Transportation Needs: Not on file  Physical Activity: Not on file  Stress: Not on file  Social Connections: Not on file  Intimate Partner Violence: Not on file     Review of Systems: General: No chills, fever, night sweats or weight changes  Cardiovascular:  No chest pain, dyspnea on exertion, edema, orthopnea, palpitations, paroxysmal nocturnal dyspnea Dermatological: No rash, lesions or masses Respiratory: No cough, dyspnea Urologic: No hematuria, dysuria Abdominal: No nausea, vomiting, diarrhea, bright red blood per rectum, melena, or hematemesis Neurologic: No visual changes, weakness, changes in mental status All other systems reviewed and are otherwise negative except as noted above.  Physical Exam: Vitals:   10/07/20 1233  BP: 124/68  Pulse: 71  SpO2: 97%  Weight: 250 lb (113.4 kg)  Height: 5\' 11"  (1.803 m)    GEN- The patient is well appearing, alert and oriented x 3 today.   HEENT: normocephalic, atraumatic; sclera clear, conjunctiva pink; hearing intact; oropharynx clear; neck supple, no JVP Lymph- no cervical lymphadenopathy Lungs- Clear to ausculation bilaterally, normal work of breathing.  No wheezes, rales, rhonchi Heart- Regular rate and rhythm, no murmurs, rubs or gallops, PMI not laterally displaced GI- soft, non-tender, non-distended, bowel sounds present, no hepatosplenomegaly Extremities- no clubbing, cyanosis, or edema; DP/PT/radial pulses 2+ bilaterally MS-  no significant deformity or atrophy Skin- warm and dry, no rash or lesion Psych- euthymic mood, full affect Neuro- strength and sensation are intact  EKG is not ordered. Personal review of EKG from 08/16/20 shows NSR at 71 bpm  Additional studies reviewed include: Previous EP office notes  Assessment and Plan:  PVCs  Atrial Fibrillation   Cardiomyopathy--nonischemic  Hyperlipidemia  Cancer Squamous cell Rx @ [facility]   NYHA II symptoms. He is preparing for LHC to rule out ischemic component to his cardiomyopathy.  Continue BB, ARNi, and MRA. Will add Farxiga 10 mg daily,  to start 1 week after heart cath. BMET today, and will repeat BMET 2 weeks AFTER starting.   RTC 4 months. Sooner with symptoms or issues.   Shirley Friar, PA-C  10/07/20 12:51 PM

## 2020-10-08 LAB — BASIC METABOLIC PANEL
BUN/Creatinine Ratio: 24 — ABNORMAL HIGH (ref 9–20)
BUN: 24 mg/dL (ref 6–24)
CO2: 19 mmol/L — ABNORMAL LOW (ref 20–29)
Calcium: 9.6 mg/dL (ref 8.7–10.2)
Chloride: 102 mmol/L (ref 96–106)
Creatinine, Ser: 1.02 mg/dL (ref 0.76–1.27)
Glucose: 115 mg/dL — ABNORMAL HIGH (ref 65–99)
Potassium: 4.5 mmol/L (ref 3.5–5.2)
Sodium: 139 mmol/L (ref 134–144)
eGFR: 87 mL/min/{1.73_m2} (ref >59)

## 2020-10-24 ENCOUNTER — Ambulatory Visit (HOSPITAL_BASED_OUTPATIENT_CLINIC_OR_DEPARTMENT_OTHER)
Payer: No Typology Code available for payment source | Admitting: Student in an Organized Health Care Education/Training Program

## 2020-10-24 ENCOUNTER — Other Ambulatory Visit: Payer: Self-pay

## 2020-10-24 ENCOUNTER — Ambulatory Visit
Admission: RE | Admit: 2020-10-24 | Discharge: 2020-10-24 | Disposition: A | Discharge status: Home / Self Care | Payer: No Typology Code available for payment source | Source: Ambulatory Visit | Attending: Student in an Organized Health Care Education/Training Program | Admitting: Student in an Organized Health Care Education/Training Program

## 2020-10-24 ENCOUNTER — Encounter: Payer: Self-pay | Admitting: Student in an Organized Health Care Education/Training Program

## 2020-10-24 VITALS — BP 121/79 | HR 70 | Temp 97.3°F | Resp 18 | Ht 71.0 in | Wt 250.0 lb

## 2020-10-24 DIAGNOSIS — G894 Chronic pain syndrome: Secondary | ICD-10-CM

## 2020-10-24 DIAGNOSIS — M47816 Spondylosis without myelopathy or radiculopathy, lumbar region: Secondary | ICD-10-CM

## 2020-10-24 MED ORDER — LIDOCAINE HCL 2 % IJ SOLN
INTRAMUSCULAR | Status: AC
  Filled 2020-10-24: qty 20

## 2020-10-24 MED ORDER — DEXAMETHASONE SODIUM PHOSPHATE 10 MG/ML IJ SOLN
10.0000 mg | Freq: Once | INTRAMUSCULAR | Status: AC
  Administered 2020-10-24: 10 mg
  Filled 2020-10-24: qty 1

## 2020-10-24 MED ORDER — FENTANYL CITRATE (PF) 100 MCG/2ML IJ SOLN: 25.0000 ug | INTRAMUSCULAR | Status: DC | PRN

## 2020-10-24 MED ORDER — LIDOCAINE HCL 2 % IJ SOLN
20.0000 mL | Freq: Once | INTRAMUSCULAR | Status: AC
  Administered 2020-10-24: 400 mg
  Filled 2020-10-24: qty 20

## 2020-10-24 MED ORDER — ROPIVACAINE HCL 2 MG/ML IJ SOLN
9.0000 mL | Freq: Once | INTRAMUSCULAR | Status: AC
  Administered 2020-10-24: 9 mL via PERINEURAL
  Filled 2020-10-24: qty 10

## 2020-10-24 NOTE — Progress Notes (Signed)
Safety precautions to be maintained throughout the outpatient stay will include: orient to surroundings, keep bed in low position, maintain call bell within reach at all times, provide assistance with transfer out of bed and ambulation.  

## 2020-10-24 NOTE — Progress Notes (Signed)
Patient's Name: [name]  MRN: [id number]  Referring Provider: Adin Hector, MD  DOB: 09-17-1965  PCP: Adin Hector, MD  DOS: 10/24/2020  Note by: Gillis Santa, MD  Service setting: Ambulatory outpatient  Specialty: Interventional Pain Management  Patient type: Established  Location: [facility] (AMB) Pain Management Facility  Visit type: Interventional Procedure   Primary Reason for Visit: Interventional Pain Management Treatment. CC: Back Pain (Lumbar bilateral )  Procedure:          Anesthesia, Analgesia, Anxiolysis:  Type: Thermal Lumbar Facet, Medial Branch Radiofrequency Ablation/Neurotomy  #3 (#2 done 05/25/2019) Primary Purpose: Therapeutic Region: Posterolateral Lumbosacral Spine Level:  L3, L4, L5, Medial Branch Level(s). These levels will denervate the L3-4, L4-5,lumbar facet joints. Laterality: Right  Type: Local Anesthesia  Local Anesthetic: Lidocaine 1-2%  Position: Prone   Indications: 1. Lumbar facet arthropathy   2. Lumbar spondylosis   3. Chronic pain syndrome    [name] has been dealing with the above chronic pain for longer than three months and has either failed to respond, was unable to tolerate, or simply did not get enough benefit from other more conservative therapies including, but not limited to: 1. Over-the-counter medications 2. Anti-inflammatory medications 3. Muscle relaxants 4. Membrane stabilizers 5. Opioids 6. Physical therapy and/or chiropractic manipulation 7. Modalities (Heat, ice, etc.) 8. Invasive techniques such as nerve blocks. [name] has attained more than 50% relief of the pain from a series of diagnostic injections conducted in separate occasions.  Pain Score: Pre-procedure: 6 /10 Post-procedure: 2 /10  Stopped Xarelto 3 days ago, last dose Friday  Pre-op Assessment:  [name] is a 55 y.o. (year old), male patient, seen today for interventional treatment. He  has a past surgical history that includes  Cardioversion (1989); Cardiac catheterization (03/2010); and Total knee arthroplasty (Left). [name] has a current medication list which includes the following prescription(s): allopurinol, atorvastatin, theratears pf, carvedilol, cholecalciferol, colchicine, cyclobenzaprine, docusate sodium, entresto, fluoxetine, ibuprofen, levothyroxine, melatonin, mirtazapine, multivitamin, mupirocin ointment, fish oil, rivaroxaban, spironolactone, vitamin c, amoxicillin, dapagliflozin propanediol, hydrocodone-acetaminophen, and meclizine. His primarily concern today is the Back Pain (Lumbar bilateral )  Initial Vital Signs:  Pulse/HCG Rate: 72  Temp: (!) 97.3 F (36.3 C) Resp: 16 BP: 116/70 SpO2: 100 %  BMI: Estimated body mass index is 34.87 kg/m as calculated from the following:   Height as of this encounter: 5\' 11"  (1.803 m).   Weight as of this encounter: 250 lb (113.4 kg).  Risk Assessment: Allergies: Reviewed. He has No Known Allergies.  Allergy Precautions: None required Coagulopathies: Reviewed. None identified.  Blood-thinner therapy: None at this time Active Infection(s): Reviewed. None identified. [name] is afebrile  Site Confirmation: [name] was asked to confirm the procedure and laterality before marking the site Procedure checklist: Completed Consent: Before the procedure and under the influence of no sedative(s), amnesic(s), or anxiolytics, the patient was informed of the treatment options, risks and possible complications. To fulfill our ethical and legal obligations, as recommended by the American Medical Association's Code of Ethics, I have informed the patient of my clinical impression; the nature and purpose of the treatment or procedure; the risks, benefits, and possible complications of the intervention; the alternatives, including doing nothing; the risk(s) and benefit(s) of the alternative treatment(s) or procedure(s); and the risk(s) and benefit(s) of doing  nothing. The patient was provided information about the general risks and possible complications associated with the procedure. These may include, but are not limited  to: failure to achieve desired goals, infection, bleeding, organ or nerve damage, allergic reactions, paralysis, and death. In addition, the patient was informed of those risks and complications associated to Spine-related procedures, such as failure to decrease pain; infection (i.e.: Meningitis, epidural or intraspinal abscess); bleeding (i.e.: epidural hematoma, subarachnoid hemorrhage, or any other type of intraspinal or peri-dural bleeding); organ or nerve damage (i.e.: Any type of peripheral nerve, nerve root, or spinal cord injury) with subsequent damage to sensory, motor, and/or autonomic systems, resulting in permanent pain, numbness, and/or weakness of one or several areas of the body; allergic reactions; (i.e.: anaphylactic reaction); and/or death. Furthermore, the patient was informed of those risks and complications associated with the medications. These include, but are not limited to: allergic reactions (i.e.: anaphylactic or anaphylactoid reaction(s)); adrenal axis suppression; blood sugar elevation that in diabetics may result in ketoacidosis or comma; water retention that in patients with history of congestive heart failure may result in shortness of breath, pulmonary edema, and decompensation with resultant heart failure; weight gain; swelling or edema; medication-induced neural toxicity; particulate matter embolism and blood vessel occlusion with resultant organ, and/or nervous system infarction; and/or aseptic necrosis of one or more joints. Finally, the patient was informed that Medicine is not an exact science; therefore, there is also the possibility of unforeseen or unpredictable risks and/or possible complications that may result in a catastrophic outcome. The patient indicated having understood very clearly. We have given  the patient no guarantees and we have made no promises. Enough time was given to the patient to ask questions, all of which were answered to the patient's satisfaction. [name] has indicated that he wanted to continue with the procedure. Attestation: I, the ordering provider, attest that I have discussed with the patient the benefits, risks, side-effects, alternatives, likelihood of achieving goals, and potential problems during recovery for the procedure that I have provided informed consent. Date  Time:   Pre-Procedure Preparation:  Monitoring: As per clinic protocol. Respiration, ETCO2, SpO2, BP, heart rate and rhythm monitor placed and checked for adequate function Safety Precautions: Patient was assessed for positional comfort and pressure points before starting the procedure. Time-out: I initiated and conducted the "Time-out" before starting the procedure, as per protocol. The patient was asked to participate by confirming the accuracy of the "Time Out" information. Verification of the correct person, site, and procedure were performed and confirmed by me, the nursing staff, and the patient. "Time-out" conducted as per Joint Commission's Universal Protocol (UP.01.01.01). Time: 1610  Description of Procedure:          Laterality: Right Levels: L3, L4, L5,Medial Branch Level(s), at the L3-4, L4-5,  lumbar facet joints. Area Prepped: Lumbosacral Prepping solution: ChloraPrep (2% chlorhexidine gluconate and 70% isopropyl alcohol) Safety Precautions: Aspiration looking for blood return was conducted prior to all injections. At no point did we inject any substances, as a needle was being advanced. Before injecting, the patient was told to immediately notify me if he was experiencing any new onset of "ringing in the ears, or metallic taste in the mouth". No attempts were made at seeking any paresthesias. Safe injection practices and needle disposal techniques used. Medications properly checked for  expiration dates. SDV (single dose vial) medications used. After the completion of the procedure, all disposable equipment used was discarded in the proper designated medical waste containers. Local Anesthesia: Protocol guidelines were followed. The patient was positioned over the fluoroscopy table. The area was prepped in the usual manner. The time-out was completed.  The target area was identified using fluoroscopy. A 12-in long, straight, sterile hemostat was used with fluoroscopic guidance to locate the targets for each level blocked. Once located, the skin was marked with an approved surgical skin marker. Once all sites were marked, the skin (epidermis, dermis, and hypodermis), as well as deeper tissues (fat, connective tissue and muscle) were infiltrated with a small amount of a short-acting local anesthetic, loaded on a 10cc syringe with a 25G, 1.5-in  Needle. An appropriate amount of time was allowed for local anesthetics to take effect before proceeding to the next step. Local Anesthetic: Lidocaine 2.0% The unused portion of the local anesthetic was discarded in the proper designated containers. Technical explanation of process:  Radiofrequency Ablation (RFA)  L3 Medial Branch Nerve RFA: The target area for the L3 medial branch is at the junction of the postero-lateral aspect of the superior articular process and the superior, posterior, and medial edge of the transverse process of L4. Under fluoroscopic guidance, a Radiofrequency needle was inserted until contact was made with os over the superior postero-lateral aspect of the pedicular shadow (target area). Sensory and motor testing was conducted to properly adjust the position of the needle. Once satisfactory placement of the needle was achieved, the numbing solution was slowly injected after negative aspiration for blood. 3mL of the nerve block solution was injected without difficulty or complication. After waiting for at least 3 minutes, the  ablation was performed. Once completed, the needle was removed intact. L4 Medial Branch Nerve RFA: The target area for the L4 medial branch is at the junction of the postero-lateral aspect of the superior articular process and the superior, posterior, and medial edge of the transverse process of L5. Under fluoroscopic guidance, a Radiofrequency needle was inserted until contact was made with os over the superior postero-lateral aspect of the pedicular shadow (target area). Sensory and motor testing was conducted to properly adjust the position of the needle. Once satisfactory placement of the needle was achieved, the numbing solution was slowly injected after negative aspiration for blood. 70mL of the nerve block solution was injected without difficulty or complication. After waiting for at least 3 minutes, the ablation was performed. Once completed, the needle was removed intact. L5 Medial Branch Nerve RFA: The target area for the L5 medial branch is at the junction of the postero-lateral aspect of the superior articular process of S1 and the superior, posterior, and medial edge of the sacral ala. Under fluoroscopic guidance, a Radiofrequency needle was inserted until contact was made with os over the superior postero-lateral aspect of the pedicular shadow (target area). Sensory and motor testing was conducted to properly adjust the position of the needle. Once satisfactory placement of the needle was achieved, the numbing solution was slowly injected after negative aspiration for blood.2 mL of the nerve block solution was injected without difficulty or complication. After waiting for at least 3 minutes, the ablation was performed. Once completed, the needle was removed intact.   Radiofrequency lesioning (ablation):  Radiofrequency Generator: NeuroTherm [id number] Sensory Stimulation Parameters: 50 Hz was used to locate & identify the nerve, making sure that the needle was positioned such that there was no  sensory stimulation below 0.3 V or above 0.7 V. Motor Stimulation Parameters: 2 Hz was used to evaluate the motor component. Care was taken not to lesion any nerves that demonstrated motor stimulation of the lower extremities at an output of less than 2.5 times that of the sensory threshold, or a maximum of 2.0  V. Lesioning Technique Parameters: Standard Radiofrequency settings. (Not bipolar or pulsed.) Temperature Settings: 80 degrees C Lesioning time: 60 seconds Intra-operative Compliance: Compliant Materials & Medications: Needle(s) (Electrode/Cannula) Type: Teflon-coated, curved tip, Radiofrequency needle(s) Gauge: 22G Length: 10cm Numbing solution: 6 cc solution made of 5 cc of 0.2% ropivacaine, 1 cc of Decadron 10 mg/cc.  2 cc injected at each level above on the right prior to ablation.  The unused portion of the solution was discarded in the proper designated containers.  Once the entire procedure was completed, the treated area was cleaned, making sure to leave some of the prepping solution back to take advantage of its long term bactericidal properties.  Illustration of the posterior view of the lumbar spine and the posterior neural structures. Laminae of L2 through S1 are labeled. DPRL5, dorsal primary ramus of L5; DPRS1, dorsal primary ramus of S1; DPR3, dorsal primary ramus of L3; FJ, facet (zygapophyseal) joint L3-L4; I, inferior articular process of L4; LB1, lateral branch of dorsal primary ramus of L1; IAB, inferior articular branches from L3 medial branch (supplies L4-L5 facet joint); IBP, intermediate branch plexus; MB3, medial branch of dorsal primary ramus of L3; NR3, third lumbar nerve root; S, superior articular process of L5; SAB, superior articular branches from L4 (supplies L4-5 facet joint also); TP3, transverse process of L3.  Vitals:   10/24/20 1306 10/24/20 1345 10/24/20 1355 10/24/20 1405  BP: 116/70 122/65 110/70 121/79  Pulse: 72 72 71 70  Resp: 16 14 14 18   Temp:  (!) 97.3 F (36.3 C)     TempSrc: Temporal     SpO2: 100% 98% 98% 99%  Weight: 250 lb (113.4 kg)     Height: 5\' 11"  (1.803 m)       Start Time: 1347 hrs. End Time: 1404 hrs.  Imaging Guidance (Spinal):          Type of Imaging Technique: Fluoroscopy Guidance (Spinal) Indication(s): Assistance in needle guidance and placement for procedures requiring needle placement in or near specific anatomical locations not easily accessible without such assistance. Exposure Time: Please see nurses notes. Contrast: None used. Fluoroscopic Guidance: I was personally present during the use of fluoroscopy. "Tunnel Vision Technique" used to obtain the best possible view of the target area. Parallax error corrected before commencing the procedure. "Direction-depth-direction" technique used to introduce the needle under continuous pulsed fluoroscopy. Once target was reached, antero-posterior, oblique, and lateral fluoroscopic projection used confirm needle placement in all planes. Images permanently stored in EMR. Interpretation: No contrast injected. I personally interpreted the imaging intraoperatively. Adequate needle placement confirmed in multiple planes. Permanent images saved into the patient's record.   Post-operative Assessment:  Post-procedure Vital Signs:  Pulse/HCG Rate: 70 (nsr)  Temp: (!) 97.3 F (36.3 C) Resp: 18 BP: 121/79 SpO2: 99 %  EBL: None  Complications: No immediate post-treatment complications observed by team, or reported by patient.  Note: The patient tolerated the entire procedure well. A repeat set of vitals were taken after the procedure and the patient was kept under observation following institutional policy, for this type of procedure. Post-procedural neurological assessment was performed, showing return to baseline, prior to discharge. The patient was provided with post-procedure discharge instructions, including a section on how to identify potential problems. Should  any problems arise concerning this procedure, the patient was given instructions to immediately contact us, at any time, without hesitation. In any case, we plan to contact the patient by telephone for a follow-up status report regarding this interventional procedure.  Comments:  No additional relevant information.  Plan of Care  Orders:  Orders Placed This Encounter  Procedures  . Radiofrequency,Lumbar    Standing Status:   Future    Standing Expiration Date:   10/24/2021    Scheduling Instructions:     Side(s): Left-sided     Level: L3-4, L4-5,  Facets (L3, L4, L5, Medial Branch Nerves)     Sedation: Patient's choice.     Scheduling Timeframe: As soon as pre-approved     Please stop Xarelto 3 days prior    Order Specific Question:   Where will this procedure be performed?    Answer:   [facility]  . [facility] C-ARM 1-60 MIN NO REPORT    Intraoperative interpretation by procedural physician at [place].    Standing Status:   Standing    Number of Occurrences:   1    Order Specific Question:   Reason for exam:    Answer:   Assistance in needle guidance and placement for procedures requiring needle placement in or near specific anatomical locations not easily accessible without such assistance.   Patient instructed to restart Xarelto tomorrow so long as he is not having any LE weakness.    Medications ordered for procedure: Meds ordered this encounter  Medications  . lidocaine (XYLOCAINE) 2 % (with pres) injection 400 mg  . DISCONTD: fentaNYL (SUBLIMAZE) injection 25-50 mcg    Make sure Narcan is available in the pyxis when using this medication. In the event of respiratory depression (RR< 8/min): Titrate NARCAN (naloxone) in increments of 0.1 to 0.2 mg IV at 2-3 minute intervals, until desired degree of reversal.  . ropivacaine (PF) 2 mg/mL (0.2%) (NAROPIN) injection 9 mL  . dexamethasone (DECADRON) injection 10 mg   Medications administered: We  administered lidocaine, ropivacaine (PF) 2 mg/mL (0.2%), and dexamethasone.  See the medical record for exact dosing, route, and time of administration.  Disposition: Discharge home  Discharge Date & Time: 10/24/2020; 1409 hrs.   Follow-up plan:   Return in about 2 weeks (around 11/07/2020) for LEFT L3,4,5 RFA (stop Xarelto 3 days prior).     Future Appointments  Date Time Provider [place]  12/28/2020 12:15 PM Alfonso Patten, MD [place] None   Primary Care Physician: Adin Hector, MD Location: [facility] Note by: Gillis Santa, MD Date: 10/24/2020; Time: 2:18 PM  Disclaimer:  Medicine is not an exact science. The only guarantee in medicine is that nothing is guaranteed. It is important to note that the decision to proceed with this intervention was based on the information collected from the patient. The Data and conclusions were drawn from the patient's questionnaire, the interview, and the physical examination. Because the information was provided in large part by the patient, it cannot be guaranteed that it has not been purposely or unconsciously manipulated. Every effort has been made to obtain as much relevant data as possible for this evaluation. It is important to note that the conclusions that lead to this procedure are derived in large part from the available data. Always take into account that the treatment will also be dependent on availability of resources and existing treatment guidelines, considered by other Pain Management Practitioners as being common knowledge and practice, at the time of the intervention. For Medico-Legal purposes, it is also important to point out that variation in procedural techniques and pharmacological choices are the acceptable norm. The indications, contraindications, technique, and results of the  above procedure should only be interpreted and judged by a Board-Certified Interventional Pain Specialist with extensive  familiarity and expertise in the same exact procedure and technique.

## 2020-10-25 ENCOUNTER — Telehealth: Payer: Self-pay

## 2020-10-25 NOTE — Telephone Encounter (Signed)
Post procedure phone call.  LM 

## 2020-11-21 ENCOUNTER — Ambulatory Visit
Payer: No Typology Code available for payment source | Attending: Student in an Organized Health Care Education/Training Program | Admitting: Student in an Organized Health Care Education/Training Program

## 2020-12-26 NOTE — Telephone Encounter (Signed)
[name])  I think this guy is primarily [name]  but if we can get him [facility], or even telehealth to review next steps following his neg cath at [place]

## 2020-12-28 ENCOUNTER — Other Ambulatory Visit: Payer: Self-pay

## 2020-12-28 ENCOUNTER — Encounter: Payer: Self-pay | Admitting: Dermatology

## 2020-12-28 ENCOUNTER — Ambulatory Visit (INDEPENDENT_AMBULATORY_CARE_PROVIDER_SITE_OTHER): Payer: No Typology Code available for payment source | Admitting: Dermatology

## 2020-12-28 ENCOUNTER — Ambulatory Visit
Payer: No Typology Code available for payment source | Admitting: Student in an Organized Health Care Education/Training Program

## 2020-12-28 DIAGNOSIS — C44612 Basal cell carcinoma of skin of right upper limb, including shoulder: Secondary | ICD-10-CM | POA: Diagnosis not present

## 2020-12-28 DIAGNOSIS — L578 Other skin changes due to chronic exposure to nonionizing radiation: Secondary | ICD-10-CM

## 2020-12-28 DIAGNOSIS — T148XXA Other injury of unspecified body region, initial encounter: Secondary | ICD-10-CM

## 2020-12-28 DIAGNOSIS — Z85828 Personal history of other malignant neoplasm of skin: Secondary | ICD-10-CM

## 2020-12-28 DIAGNOSIS — D485 Neoplasm of uncertain behavior of skin: Secondary | ICD-10-CM

## 2020-12-28 DIAGNOSIS — D229 Melanocytic nevi, unspecified: Secondary | ICD-10-CM

## 2020-12-28 DIAGNOSIS — Z1283 Encounter for screening for malignant neoplasm of skin: Secondary | ICD-10-CM

## 2020-12-28 DIAGNOSIS — L821 Other seborrheic keratosis: Secondary | ICD-10-CM

## 2020-12-28 DIAGNOSIS — L814 Other melanin hyperpigmentation: Secondary | ICD-10-CM

## 2020-12-28 DIAGNOSIS — D1800 Hemangioma unspecified site: Secondary | ICD-10-CM

## 2020-12-28 DIAGNOSIS — C4491 Basal cell carcinoma of skin, unspecified: Secondary | ICD-10-CM

## 2020-12-28 MED ORDER — MUPIROCIN 2 % EX OINT: 1.0000 "application " | TOPICAL_OINTMENT | Freq: Every day | CUTANEOUS | 0 refills | Status: AC

## 2020-12-28 MED ORDER — NIACINAMIDE ER 500 MG PO TBCR: 1.0000 | EXTENDED_RELEASE_TABLET | Freq: Two times a day (BID) | ORAL | 11 refills | Status: AC

## 2020-12-28 NOTE — Patient Instructions (Addendum)
Wound Care Instructions  1. Cleanse wound gently with soap and water once a day then pat dry with clean gauze. Apply a thing coat of Petrolatum (petroleum jelly, "Vaseline") over the wound (unless you have an allergy to this). We recommend that you use a new, sterile tube of Vaseline. Do not pick or remove scabs. Do not remove the yellow or white "healing tissue" from the base of the wound.  2. Cover the wound with fresh, clean, nonstick gauze and secure with paper tape. You may use Band-Aids in place of gauze and tape if the would is small enough, but would recommend trimming much of the tape off as there is often too much. Sometimes Band-Aids can irritate the skin.  3. You should call the office for your biopsy report after 1 week if you have not already been contacted.  4. If you experience any problems, such as abnormal amounts of bleeding, swelling, significant bruising, significant pain, or evidence of infection, please call the office immediately.  5. FOR ADULT SURGERY PATIENTS: If you need something for pain relief you may take 1 extra strength Tylenol (acetaminophen) AND 2 Ibuprofen (200mg  each) together every 4 hours as needed for pain. (do not take these if you are allergic to them or if you have a reason you should not take them.) Typically, you may only need pain medication for 1 to 3 days.    Melanoma ABCDEs  Melanoma is the most dangerous type of skin cancer, and is the leading cause of death from skin disease.  You are more likely to develop melanoma if you:  Have light-colored skin, light-colored eyes, or red or blond hair  Spend a lot of time in the sun  Tan regularly, either outdoors or in a tanning bed  Have had blistering sunburns, especially during childhood  Have a close family member who has had a melanoma  Have atypical moles or large birthmarks  Early detection of melanoma is key since treatment is typically straightforward and cure rates are extremely high if  we catch it early.   The first sign of melanoma is often a change in a mole or a new dark spot.  The ABCDE system is a way of remembering the signs of melanoma.  A for asymmetry:  The two halves do not match. B for border:  The edges of the growth are irregular. C for color:  A mixture of colors are present instead of an even brown color. D for diameter:  Melanomas are usually (but not always) greater than 40mm - the size of a pencil eraser. E for evolution:  The spot keeps changing in size, shape, and color.  Please check your skin once per month between visits. You can use a small mirror in front and a large mirror behind you to keep an eye on the back side or your body.   If you see any new or changing lesions before your next follow-up, please call to schedule a visit.  Please continue daily skin protection including broad spectrum sunscreen SPF 30+ to sun-exposed areas, reapplying every 2 hours as needed when you're outdoors.   Staying in the shade or wearing long sleeves, sun glasses (UVA+UVB protection) and wide brim hats (4-inch brim around the entire circumference of the hat) are also recommended for sun protection.    If you have any questions or concerns for your doctor, please call our main line at [phone] and press option 4 to reach your doctor's medical assistant. If  no one answers, please leave a voicemail as directed and we will return your call as soon as possible. Messages left after 4 pm will be answered the following business day.   You may also send [place] a message via [place]. We typically respond to MyChart messages within 1-2 business days.  For prescription refills, please ask your pharmacy to contact our office. Our fax number is [phone].  If you have an urgent issue when the clinic is closed that cannot wait until the next business day, you can page your doctor at the number below.    Please note that while we do our best to be available for urgent issues  outside of office hours, we are not available 24/7.   If you have an urgent issue and are unable to reach [place], you may choose to seek medical care at your doctor's office, retail clinic, urgent care center, or emergency room.  If you have a medical emergency, please immediately call 911 or go to the emergency department.  Pager Numbers  - Dr. Nehemiah Massed: [phone]  - Dr. Laurence Ferrari: [phone]  - Dr. Nicole Kindred: [phone]  In the event of inclement weather, please call our main line at [phone] for an update on the status of any delays or closures.  Dermatology Medication Tips: Please keep the boxes that topical medications come in in order to help keep track of the instructions about where and how to use these. Pharmacies typically print the medication instructions only on the boxes and not directly on the medication tubes.   If your medication is too expensive, please contact our office at [phone] option 4 or send [place] a message through [place].   We are unable to tell what your co-pay for medications will be in advance as this is different depending on your insurance coverage. However, we may be able to find a substitute medication at lower cost or fill out paperwork to get insurance to cover a needed medication.   If a prior authorization is required to get your medication covered by your insurance company, please allow [place] 1-2 business days to complete this process.  Drug prices often vary depending on where the prescription is filled and some pharmacies may offer cheaper prices.  The website www.goodrx.com contains coupons for medications through different pharmacies. The prices here do not account for what the cost may be with help from insurance (it may be cheaper with your insurance), but the website can give you the price if you did not use any insurance.  - You can print the associated coupon and take it with your prescription to the pharmacy.  - You may also stop by our  office during regular business hours and pick up a GoodRx coupon card.  - If you need your prescription sent electronically to a different pharmacy, notify our office through [facility] or by phone at [phone] option 4.  Recommend taking Heliocare sun protection supplement daily in sunny weather for additional sun protection. For maximum protection on the sunniest days, you can take up to 2 capsules of regular Heliocare OR take 1 capsule of Heliocare Ultra. For prolonged exposure (such as a full day in the sun), you can repeat your dose of the supplement 4 hours after your first dose. Heliocare can be purchased at [facility] or at [email or web address].   Recommend Niacinamide or Nicotinamide 500mg  twice per day to lower risk of non-melanoma skin cancer by approximately 25%. Usually available at [employer].

## 2020-12-28 NOTE — Progress Notes (Signed)
Follow-Up Visit   Subjective  [name] is a 55 y.o. male who presents for the following: Annual Exam (Here for skin cancer screening. Full body. 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 ).  The following portions of the chart were reviewed this encounter and updated as appropriate:  Tobacco  Allergies  Meds  Problems  Med Hx  Surg Hx  Fam Hx      Review of Systems: No other skin or systemic complaints except as noted in HPI or Assessment and Plan.   Objective  Well appearing patient in no apparent distress; mood and affect are within normal limits.  A full examination was performed including scalp, head, eyes, ears, nose, lips, neck, chest, axillae, abdomen, back, buttocks, bilateral upper extremities, bilateral lower extremities, hands, feet, fingers, toes, fingernails, and toenails. All findings within normal limits unless otherwise noted below.  Objective  Right Upper Arm: 0.5cm pink firm papule     Objective  Left elbow, left neck: Excoriation   Assessment & Plan  Neoplasm of uncertain behavior of skin Right Upper Arm  Skin / nail biopsy Type of biopsy: punch   Informed consent: discussed and consent obtained   Timeout: patient name, date of birth, surgical site, and procedure verified   Patient was prepped and draped in usual sterile fashion: Area prepped with isopropyl alcohol. Anesthesia: the lesion was anesthetized in a standard fashion   Anesthetic:  1% lidocaine w/ epinephrine 1-100,000 buffered w/ 8.4% NaHCO3 Punch size:  4 mm Suture size:  4-0 Suture type: Prolene (polypropylene)   Hemostasis achieved with: suture and aluminum chloride   Outcome: patient tolerated procedure well   Post-procedure details: wound care instructions given   Additional details:  Mupirocin and a dressing applied  Specimen 1 - Surgical pathology Differential Diagnosis: r/o BCC vs Amelanotic vs Other  Check Margins: No 0.5cm pink firm  papule  Excoriation Left elbow, left neck  Start mupirocin daily to affected areas  Recheck on follow up. Call if worsening or thickening.  Reordered Medications mupirocin ointment (BACTROBAN) 2 %   Lentigines - Scattered tan macules - Due to sun exposure - Benign-appering, observe - Recommend daily broad spectrum sunscreen SPF 30+ to sun-exposed areas, reapply every 2 hours as needed. - Call for any changes  Seborrheic Keratoses - Stuck-on, waxy, tan-brown papules and/or plaques  - Benign-appearing - Discussed benign etiology and prognosis. - Observe - Call for any changes  Melanocytic Nevi - Tan-brown and/or pink-flesh-colored symmetric macules and papules - Benign appearing on exam today - Observation - Call clinic for new or changing moles - Recommend daily use of broad spectrum spf 30+ sunscreen to sun-exposed areas.   Hemangiomas - Red papules - Discussed benign nature - Observe - Call for any changes  Actinic Damage - Chronic condition, secondary to cumulative UV/sun exposure - diffuse scaly erythematous macules with underlying dyspigmentation - Recommend daily broad spectrum sunscreen SPF 30+ to sun-exposed areas, reapply every 2 hours as needed.  - Staying in the shade or wearing long sleeves, sun glasses (UVA+UVB protection) and wide brim hats (4-inch brim around the entire circumference of the hat) are also recommended for sun protection.  - Call for new or changing lesions.  Skin cancer screening performed today.  History of Basal Cell Carcinoma of the Skin - No evidence of recurrence today - Recommend regular full body skin exams - Recommend daily broad spectrum sunscreen SPF 30+ to sun-exposed areas, reapply every 2 hours as  needed.  - Call if any new or changing lesions are noted between office visits    Return in about 2 weeks (around 01/11/2021) for Suture Removal.   I, Emelia Salisbury, CMA, am acting as scribe for Forest Gleason,  MD.  Documentation: I have reviewed the above documentation for accuracy and completeness, and I agree with the above.  Forest Gleason, MD

## 2021-01-09 ENCOUNTER — Telehealth: Payer: Self-pay

## 2021-01-09 NOTE — Telephone Encounter (Signed)
LM on VM returning his call, please call us back

## 2021-01-11 ENCOUNTER — Telehealth: Payer: Self-pay

## 2021-01-11 NOTE — Telephone Encounter (Signed)
Spoke with pt and went over pathology results. Advise pt that Dr. Laurence Ferrari will be doing an [facility] tomorrow at his f/u visit. He had no concerns.

## 2021-01-12 ENCOUNTER — Other Ambulatory Visit: Payer: Self-pay

## 2021-01-12 ENCOUNTER — Ambulatory Visit (INDEPENDENT_AMBULATORY_CARE_PROVIDER_SITE_OTHER): Payer: No Typology Code available for payment source | Admitting: Dermatology

## 2021-01-12 DIAGNOSIS — Z4802 Encounter for removal of sutures: Secondary | ICD-10-CM | POA: Diagnosis not present

## 2021-01-12 NOTE — Progress Notes (Signed)
   Follow-Up Visit   Subjective  [name] is a 55 y.o. male who presents for the following: Follow-up (Patient here today for suture removal at right upper arm. ).  The following portions of the chart were reviewed this encounter and updated as appropriate:   Tobacco  Allergies  Meds  Problems  Med Hx  Surg Hx  Fam Hx       Review of Systems:  No other skin or systemic complaints except as noted in HPI or Assessment and Plan.  Objective  Well appearing patient in no apparent distress; mood and affect are within normal limits.  A focused examination was performed including right upper arm. Relevant physical exam findings are noted in the Assessment and Plan.    Assessment & Plan   Encounter for Removal of Sutures - Incision site at the right upper arm is clean, dry and intact - Wound cleansed, sutures removed, wound cleansed and steri strips applied.  - Discussed pathology results showing superficial BCC . Will plan ED&C. Will wait until biopsy site completely healed to prevent dehiscence at biopsy site.  - Patient advised to keep steri-strips dry until they fall off. - Scars remodel for a full year. - Once steri-strips fall off, patient can apply over-the-counter silicone scar cream each night to help with scar remodeling if desired. - Patient advised to call with any concerns or if they notice any new or changing lesions.  Return for 4-8 weeks for [facility].  Graciella Belton, RMA, am acting as scribe for Forest Gleason, MD .  Documentation: I have reviewed the above documentation for accuracy and completeness, and I agree with the above.  Forest Gleason, MD

## 2021-01-12 NOTE — Patient Instructions (Signed)

## 2021-01-23 ENCOUNTER — Encounter: Payer: Self-pay | Admitting: Student in an Organized Health Care Education/Training Program

## 2021-01-25 ENCOUNTER — Encounter: Payer: Self-pay | Admitting: Dermatology

## 2021-01-30 ENCOUNTER — Ambulatory Visit (HOSPITAL_BASED_OUTPATIENT_CLINIC_OR_DEPARTMENT_OTHER)
Payer: No Typology Code available for payment source | Admitting: Student in an Organized Health Care Education/Training Program

## 2021-01-30 ENCOUNTER — Ambulatory Visit
Admission: RE | Admit: 2021-01-30 | Discharge: 2021-01-30 | Disposition: A | Discharge status: Home / Self Care | Payer: No Typology Code available for payment source | Source: Ambulatory Visit | Attending: Student in an Organized Health Care Education/Training Program | Admitting: Student in an Organized Health Care Education/Training Program

## 2021-01-30 ENCOUNTER — Other Ambulatory Visit: Payer: Self-pay

## 2021-01-30 ENCOUNTER — Encounter: Payer: Self-pay | Admitting: Student in an Organized Health Care Education/Training Program

## 2021-01-30 VITALS — BP 114/82 | HR 63 | Temp 96.9°F | Resp 12 | Ht 71.0 in | Wt 255.0 lb

## 2021-01-30 DIAGNOSIS — M47816 Spondylosis without myelopathy or radiculopathy, lumbar region: Secondary | ICD-10-CM | POA: Insufficient documentation

## 2021-01-30 DIAGNOSIS — G894 Chronic pain syndrome: Secondary | ICD-10-CM | POA: Diagnosis not present

## 2021-01-30 MED ORDER — ROPIVACAINE HCL 2 MG/ML IJ SOLN
9.0000 mL | Freq: Once | INTRAMUSCULAR | Status: AC
  Administered 2021-01-30: 20 mL via PERINEURAL

## 2021-01-30 MED ORDER — LIDOCAINE HCL (PF) 2 % IJ SOLN
INTRAMUSCULAR | Status: AC
  Filled 2021-01-30: qty 10

## 2021-01-30 MED ORDER — DEXAMETHASONE SODIUM PHOSPHATE 10 MG/ML IJ SOLN
10.0000 mg | Freq: Once | INTRAMUSCULAR | Status: AC
  Administered 2021-01-30: 10 mg
  Filled 2021-01-30: qty 1

## 2021-01-30 MED ORDER — ROPIVACAINE HCL 2 MG/ML IJ SOLN
INTRAMUSCULAR | Status: AC
  Filled 2021-01-30: qty 20

## 2021-01-30 MED ORDER — LIDOCAINE HCL 2 % IJ SOLN
20.0000 mL | Freq: Once | INTRAMUSCULAR | Status: AC
  Administered 2021-01-30: 100 mg

## 2021-01-30 NOTE — Patient Instructions (Addendum)

## 2021-01-30 NOTE — Progress Notes (Signed)
Safety precautions to be maintained throughout the outpatient stay will include: orient to surroundings, keep bed in low position, maintain call bell within reach at all times, provide assistance with transfer out of bed and ambulation.  

## 2021-01-30 NOTE — Progress Notes (Signed)
Patient's Name: [name]  MRN: [id number]  Referring Provider: Adin Hector, MD  DOB: 01/06/1966  PCP: Adin Hector, MD  DOS: 01/30/2021  Note by: Gillis Santa, MD  Service setting: Ambulatory outpatient  Specialty: Interventional Pain Management  Patient type: Established  Location: [facility] (AMB) Pain Management Facility  Visit type: Interventional Procedure   Primary Reason for Visit: Interventional Pain Management Treatment. CC: Back Pain (Left lumbar )  Procedure:          Anesthesia, Analgesia, Anxiolysis:  Type: Thermal Lumbar Facet, Medial Branch Radiofrequency Ablation/Neurotomy  #3 (#2 done 05/04/20) Primary Purpose: Therapeutic Region: Posterolateral Lumbosacral Spine Level:  L3, L4, L5, Medial Branch Level(s). These levels will denervate the L3-4, L4-5,lumbar facet joints. Laterality: Left  Type: Local Anesthesia  Local Anesthetic: Lidocaine 1-2%  Position: Prone   Indications: 1. Lumbar facet arthropathy   2. Lumbar spondylosis   3. Chronic pain syndrome     [name] has been dealing with the above chronic pain for longer than three months and has either failed to respond, was unable to tolerate, or simply did not get enough benefit from other more conservative therapies including, but not limited to: 1. Over-the-counter medications 2. Anti-inflammatory medications 3. Muscle relaxants 4. Membrane stabilizers 5. Opioids 6. Physical therapy and/or chiropractic manipulation 7. Modalities (Heat, ice, etc.) 8. Invasive techniques such as nerve blocks.  [name] has attained more than 50% relief of the pain from a series of diagnostic injections conducted in separate occasions.  Pain Score: Pre-procedure: 5 /10 Post-procedure: 4 /10  Stopped Xarelto 3 days ago, last dose Thurs  Pre-op Assessment:  [name] is a 55 y.o. (year old), male patient, seen today for interventional treatment. He  has a past surgical history that includes Cardioversion  (1989); Cardiac catheterization (03/2010); and Total knee arthroplasty (Left). [name] has a current medication list which includes the following prescription(s): allopurinol, atorvastatin, carvedilol, cholecalciferol, colchicine, cyclobenzaprine, docusate sodium, empagliflozin, entresto, fenofibrate, fluoxetine, ibuprofen, levothyroxine, levothyroxine, magnesium, melatonin, mirtazapine, multivitamin, mupirocin ointment, niacinamide, fish oil, rivaroxaban, spironolactone, theratears, vitamin c, amoxicillin, atorvastatin, dapagliflozin propanediol, hydrocodone-acetaminophen, and meclizine. His primarily concern today is the Back Pain (Left lumbar )  Initial Vital Signs:  Pulse/HCG Rate: 63ECG Heart Rate: 63 Temp: (!) 96.9 F (36.1 C) Resp: 16 BP: 121/86 SpO2: 99 %  BMI: Estimated body mass index is 35.57 kg/m as calculated from the following:   Height as of this encounter: 5\' 11"  (1.803 m).   Weight as of this encounter: 255 lb (115.7 kg).  Risk Assessment: Allergies: Reviewed. He has No Known Allergies.  Allergy Precautions: None required Coagulopathies: Reviewed. None identified.  Blood-thinner therapy: None at this time Active Infection(s): Reviewed. None identified. [name] is afebrile  Site Confirmation: [name] was asked to confirm the procedure and laterality before marking the site Procedure checklist: Completed Consent: Before the procedure and under the influence of no sedative(s), amnesic(s), or anxiolytics, the patient was informed of the treatment options, risks and possible complications. To fulfill our ethical and legal obligations, as recommended by the American Medical Association's Code of Ethics, I have informed the patient of my clinical impression; the nature and purpose of the treatment or procedure; the risks, benefits, and possible complications of the intervention; the alternatives, including doing nothing; the risk(s) and benefit(s) of the alternative  treatment(s) or procedure(s); and the risk(s) and benefit(s) of doing nothing. The patient was provided information about the general risks and possible complications associated with  the procedure. These may include, but are not limited to: failure to achieve desired goals, infection, bleeding, organ or nerve damage, allergic reactions, paralysis, and death. In addition, the patient was informed of those risks and complications associated to Spine-related procedures, such as failure to decrease pain; infection (i.e.: Meningitis, epidural or intraspinal abscess); bleeding (i.e.: epidural hematoma, subarachnoid hemorrhage, or any other type of intraspinal or peri-dural bleeding); organ or nerve damage (i.e.: Any type of peripheral nerve, nerve root, or spinal cord injury) with subsequent damage to sensory, motor, and/or autonomic systems, resulting in permanent pain, numbness, and/or weakness of one or several areas of the body; allergic reactions; (i.e.: anaphylactic reaction); and/or death. Furthermore, the patient was informed of those risks and complications associated with the medications. These include, but are not limited to: allergic reactions (i.e.: anaphylactic or anaphylactoid reaction(s)); adrenal axis suppression; blood sugar elevation that in diabetics may result in ketoacidosis or comma; water retention that in patients with history of congestive heart failure may result in shortness of breath, pulmonary edema, and decompensation with resultant heart failure; weight gain; swelling or edema; medication-induced neural toxicity; particulate matter embolism and blood vessel occlusion with resultant organ, and/or nervous system infarction; and/or aseptic necrosis of one or more joints. Finally, the patient was informed that Medicine is not an exact science; therefore, there is also the possibility of unforeseen or unpredictable risks and/or possible complications that may result in a catastrophic  outcome. The patient indicated having understood very clearly. We have given the patient no guarantees and we have made no promises. Enough time was given to the patient to ask questions, all of which were answered to the patient's satisfaction. Mr. Feutz has indicated that he wanted to continue with the procedure. Attestation: I, the ordering provider, attest that I have discussed with the patient the benefits, risks, side-effects, alternatives, likelihood of achieving goals, and potential problems during recovery for the procedure that I have provided informed consent. Date  Time:   Pre-Procedure Preparation:  Monitoring: As per clinic protocol. Respiration, ETCO2, SpO2, BP, heart rate and rhythm monitor placed and checked for adequate function Safety Precautions: Patient was assessed for positional comfort and pressure points before starting the procedure. Time-out: I initiated and conducted the "Time-out" before starting the procedure, as per protocol. The patient was asked to participate by confirming the accuracy of the "Time Out" information. Verification of the correct person, site, and procedure were performed and confirmed by me, the nursing staff, and the patient. "Time-out" conducted as per Joint Commission's Universal Protocol (UP.01.01.01). Time: 1011  Description of Procedure:          Laterality: Left Levels: L3, L4, L5,Medial Branch Level(s), at the L3-4, L4-5,  lumbar facet joints. Area Prepped: Lumbosacral Prepping solution: ChloraPrep (2% chlorhexidine gluconate and 70% isopropyl alcohol) Safety Precautions: Aspiration looking for blood return was conducted prior to all injections. At no point did we inject any substances, as a needle was being advanced. Before injecting, the patient was told to immediately notify me if he was experiencing any new onset of "ringing in the ears, or metallic taste in the mouth". No attempts were made at seeking any paresthesias. Safe injection  practices and needle disposal techniques used. Medications properly checked for expiration dates. SDV (single dose vial) medications used. After the completion of the procedure, all disposable equipment used was discarded in the proper designated medical waste containers. Local Anesthesia: Protocol guidelines were followed. The patient was positioned over the fluoroscopy table. The area was  prepped in the usual manner. The time-out was completed. The target area was identified using fluoroscopy. A 12-in long, straight, sterile hemostat was used with fluoroscopic guidance to locate the targets for each level blocked. Once located, the skin was marked with an approved surgical skin marker. Once all sites were marked, the skin (epidermis, dermis, and hypodermis), as well as deeper tissues (fat, connective tissue and muscle) were infiltrated with a small amount of a short-acting local anesthetic, loaded on a 10cc syringe with a 25G, 1.5-in  Needle. An appropriate amount of time was allowed for local anesthetics to take effect before proceeding to the next step. Local Anesthetic: Lidocaine 2.0% The unused portion of the local anesthetic was discarded in the proper designated containers. Technical explanation of process:  Radiofrequency Ablation (RFA)  L3 Medial Branch Nerve RFA: The target area for the L3 medial branch is at the junction of the postero-lateral aspect of the superior articular process and the superior, posterior, and medial edge of the transverse process of L4. Under fluoroscopic guidance, a Radiofrequency needle was inserted until contact was made with os over the superior postero-lateral aspect of the pedicular shadow (target area). Sensory and motor testing was conducted to properly adjust the position of the needle. Once satisfactory placement of the needle was achieved, the numbing solution was slowly injected after negative aspiration for blood. 4mL of the nerve block solution was injected  without difficulty or complication. After waiting for at least 3 minutes, the ablation was performed. Once completed, the needle was removed intact. L4 Medial Branch Nerve RFA: The target area for the L4 medial branch is at the junction of the postero-lateral aspect of the superior articular process and the superior, posterior, and medial edge of the transverse process of L5. Under fluoroscopic guidance, a Radiofrequency needle was inserted until contact was made with os over the superior postero-lateral aspect of the pedicular shadow (target area). Sensory and motor testing was conducted to properly adjust the position of the needle. Once satisfactory placement of the needle was achieved, the numbing solution was slowly injected after negative aspiration for blood. 70mL of the nerve block solution was injected without difficulty or complication. After waiting for at least 3 minutes, the ablation was performed. Once completed, the needle was removed intact. L5 Medial Branch Nerve RFA: The target area for the L5 medial branch is at the junction of the postero-lateral aspect of the superior articular process of S1 and the superior, posterior, and medial edge of the sacral ala. Under fluoroscopic guidance, a Radiofrequency needle was inserted until contact was made with os over the superior postero-lateral aspect of the pedicular shadow (target area). Sensory and motor testing was conducted to properly adjust the position of the needle. Once satisfactory placement of the needle was achieved, the numbing solution was slowly injected after negative aspiration for blood.2 mL of the nerve block solution was injected without difficulty or complication. After waiting for at least 3 minutes, the ablation was performed. Once completed, the needle was removed intact.   Radiofrequency lesioning (ablation):  Radiofrequency Generator: NeuroTherm [id number] Sensory Stimulation Parameters: 50 Hz was used to locate & identify the  nerve, making sure that the needle was positioned such that there was no sensory stimulation below 0.3 V or above 0.7 V. Motor Stimulation Parameters: 2 Hz was used to evaluate the motor component. Care was taken not to lesion any nerves that demonstrated motor stimulation of the lower extremities at an output of less than 2.5 times that  of the sensory threshold, or a maximum of 2.0 V. Lesioning Technique Parameters: Standard Radiofrequency settings. (Not bipolar or pulsed.) Temperature Settings: 80 degrees C Lesioning time: 60 seconds Intra-operative Compliance: Compliant Materials & Medications: Needle(s) (Electrode/Cannula) Type: Teflon-coated, curved tip, Radiofrequency needle(s) Gauge: 22G Length: 10cm Numbing solution: 6 cc solution made of 5 cc of 0.2% ropivacaine, 1 cc of Decadron 10 mg/cc.  2 cc injected at each level above on the left prior to ablation.  The unused portion of the solution was discarded in the proper designated containers.  Once the entire procedure was completed, the treated area was cleaned, making sure to leave some of the prepping solution back to take advantage of its long term bactericidal properties.  Illustration of the posterior view of the lumbar spine and the posterior neural structures. Laminae of L2 through S1 are labeled. DPRL5, dorsal primary ramus of L5; DPRS1, dorsal primary ramus of S1; DPR3, dorsal primary ramus of L3; FJ, facet (zygapophyseal) joint L3-L4; I, inferior articular process of L4; LB1, lateral branch of dorsal primary ramus of L1; IAB, inferior articular branches from L3 medial branch (supplies L4-L5 facet joint); IBP, intermediate branch plexus; MB3, medial branch of dorsal primary ramus of L3; NR3, third lumbar nerve root; S, superior articular process of L5; SAB, superior articular branches from L4 (supplies L4-5 facet joint also); TP3, transverse process of L3.  Vitals:   01/30/21 1014 01/30/21 1019 01/30/21 1024 01/30/21 1030  BP:  119/78 121/74 121/77 114/82  Pulse:      Resp: 13 14 14 12   Temp:      TempSrc:      SpO2: 98% 98% 98% 98%  Weight:      Height:        Start Time: 1011 hrs. End Time: 1030 hrs.  Imaging Guidance (Spinal):          Type of Imaging Technique: Fluoroscopy Guidance (Spinal) Indication(s): Assistance in needle guidance and placement for procedures requiring needle placement in or near specific anatomical locations not easily accessible without such assistance. Exposure Time: Please see nurses notes. Contrast: None used. Fluoroscopic Guidance: I was personally present during the use of fluoroscopy. "Tunnel Vision Technique" used to obtain the best possible view of the target area. Parallax error corrected before commencing the procedure. "Direction-depth-direction" technique used to introduce the needle under continuous pulsed fluoroscopy. Once target was reached, antero-posterior, oblique, and lateral fluoroscopic projection used confirm needle placement in all planes. Images permanently stored in EMR. Interpretation: No contrast injected. I personally interpreted the imaging intraoperatively. Adequate needle placement confirmed in multiple planes. Permanent images saved into the patient's record.   Post-operative Assessment:  Post-procedure Vital Signs:  Pulse/HCG Rate: 6368 Temp: (!) 96.9 F (36.1 C) Resp: 12 BP: 114/82 SpO2: 98 %  EBL: None  Complications: No immediate post-treatment complications observed by team, or reported by patient.  Note: The patient tolerated the entire procedure well. A repeat set of vitals were taken after the procedure and the patient was kept under observation following institutional policy, for this type of procedure. Post-procedural neurological assessment was performed, showing return to baseline, prior to discharge. The patient was provided with post-procedure discharge instructions, including a section on how to identify potential problems. Should  any problems arise concerning this procedure, the patient was given instructions to immediately contact us, at any time, without hesitation. In any case, we plan to contact the patient by telephone for a follow-up status report regarding this interventional procedure.  Comments:  No  additional relevant information.  Plan of Care  Orders:  Orders Placed This Encounter  Procedures   DG PAIN CLINIC C-ARM 1-60 MIN NO REPORT    Intraoperative interpretation by procedural physician at [place].    Standing Status:   Standing    Number of Occurrences:   1    Order Specific Question:   Reason for exam:    Answer:   Assistance in needle guidance and placement for procedures requiring needle placement in or near specific anatomical locations not easily accessible without such assistance.    Patient instructed to restart Xarelto tomorrow so long as he is not having any LE weakness.    Medications ordered for procedure: Meds ordered this encounter  Medications   lidocaine (XYLOCAINE) 2 % (with pres) injection 400 mg   dexamethasone (DECADRON) injection 10 mg   ropivacaine (PF) 2 mg/mL (0.2%) (NAROPIN) injection 9 mL    Medications administered: We administered lidocaine, dexamethasone, and ropivacaine (PF) 2 mg/mL (0.2%).  See the medical record for exact dosing, route, and time of administration.  Disposition: Discharge home  Discharge Date & Time: 01/30/2021; 1040 hrs.   Follow-up plan:   Return in about 8 weeks (around 03/27/2021) for Post Procedure Evaluation, virtual.     Future Appointments  Date Time Provider [place]  03/07/2021  8:00 AM Laurence Ferrari, [place], MD [facility] None  03/08/2021  3:40 PM Gillis Santa, MD [facility] None   Primary Care Physician: Adin Hector, MD Location: [facility] Note by: Gillis Santa, MD Date: 01/30/2021; Time: 10:48 AM  Disclaimer:  Medicine is not an exact science. The only guarantee in medicine is  that nothing is guaranteed. It is important to note that the decision to proceed with this intervention was based on the information collected from the patient. The Data and conclusions were drawn from the patient's questionnaire, the interview, and the physical examination. Because the information was provided in large part by the patient, it cannot be guaranteed that it has not been purposely or unconsciously manipulated. Every effort has been made to obtain as much relevant data as possible for this evaluation. It is important to note that the conclusions that lead to this procedure are derived in large part from the available data. Always take into account that the treatment will also be dependent on availability of resources and existing treatment guidelines, considered by other Pain Management Practitioners as being common knowledge and practice, at the time of the intervention. For Medico-Legal purposes, it is also important to point out that variation in procedural techniques and pharmacological choices are the acceptable norm. The indications, contraindications, technique, and results of the above procedure should only be interpreted and judged by a Board-Certified Interventional Pain Specialist with extensive familiarity and expertise in the same exact procedure and technique.

## 2021-01-31 ENCOUNTER — Telehealth: Payer: Self-pay

## 2021-01-31 NOTE — Telephone Encounter (Signed)
Post procedure phone call.  LM 

## 2021-02-02 ENCOUNTER — Ambulatory Visit: Payer: No Typology Code available for payment source | Admitting: Internal Medicine

## 2021-02-12 NOTE — Telephone Encounter (Signed)
Heather Can you please call [name] and ask him if he would be willing to divide his carvedilol from 12.5 qd >> 6.25 daily  if he prefers once daily, we can see if coreg CR is still made Thanks SK

## 2021-02-17 NOTE — Telephone Encounter (Signed)
I see that he is taking coreg 12.5 bid   lets leave it that way Thanks SK

## 2021-03-07 ENCOUNTER — Other Ambulatory Visit: Payer: Self-pay

## 2021-03-07 ENCOUNTER — Ambulatory Visit (INDEPENDENT_AMBULATORY_CARE_PROVIDER_SITE_OTHER): Payer: No Typology Code available for payment source | Admitting: Dermatology

## 2021-03-07 DIAGNOSIS — C44612 Basal cell carcinoma of skin of right upper limb, including shoulder: Secondary | ICD-10-CM | POA: Diagnosis not present

## 2021-03-07 DIAGNOSIS — B079 Viral wart, unspecified: Secondary | ICD-10-CM

## 2021-03-07 NOTE — Progress Notes (Signed)
   Follow-Up Visit   Subjective  [name] is a 55 y.o. male who presents for the following: superficial BCC (Bx proven of the R upper arm - patient is here today for treatment. ) and Lesion (On the L arm - has been present for about 2 months. Patient picks at it. ).  The following portions of the chart were reviewed this encounter and updated as appropriate:   Tobacco  Allergies  Meds  Problems  Med Hx  Surg Hx  Fam Hx     Review of Systems:  No other skin or systemic complaints except as noted in HPI or Assessment and Plan.  Objective  Well appearing patient in no apparent distress; mood and affect are within normal limits.  A focused examination was performed including the arms. Relevant physical exam findings are noted in the Assessment and Plan.  R upper arm Pink biopsy site.   L forearm x 1 Verrucous papules -- Discussed viral etiology and contagion.    Assessment & Plan  Basal cell carcinoma (BCC) of skin of right upper extremity including shoulder R upper arm  Destruction of lesion Complexity: extensive   Destruction method: electrodesiccation and curettage   Informed consent: discussed and consent obtained   Timeout:  patient name, date of birth, surgical site, and procedure verified Procedure prep:  Patient was prepped and draped in usual sterile fashion Prep type:  Isopropyl alcohol Anesthesia: the lesion was anesthetized in a standard fashion   Local anesthetic: Bupivicaine 0.75% Curettage performed in three different directions: Yes   Electrodesiccation performed over the curetted area: Yes   Lesion length (cm):  1.5 Lesion width (cm):  1.5 Hemostasis achieved with:  pressure, aluminum chloride and electrodesiccation Outcome: patient tolerated procedure well with no complications   Post-procedure details: sterile dressing applied and wound care instructions given   Dressing type: bandage and petrolatum    Viral warts, unspecified type L  forearm x 1  Discussed viral etiology and risk of spread.  Discussed multiple treatments may be required to clear warts.  Discussed possible post-treatment dyspigmentation and risk of recurrence.  Prior to procedure, discussed risks of blister formation, small wound, skin dyspigmentation, or rare scar following cryotherapy. Recommend Vaseline ointment to treated areas while healing.   Destruction of lesion - L forearm x 1 Complexity: simple   Destruction method: cryotherapy   Informed consent: discussed and consent obtained   Timeout:  patient name, date of birth, surgical site, and procedure verified Lesion destroyed using liquid nitrogen: Yes   Region frozen until ice ball extended beyond lesion: Yes   Outcome: patient tolerated procedure well with no complications   Post-procedure details: wound care instructions given    Return in about 2 months (around 05/07/2021) for wart and ED&C site recheck.  Luther Redo, CMA, am acting as scribe for Forest Gleason, MD .   Documentation: I have reviewed the above documentation for accuracy and completeness, and I agree with the above.  Forest Gleason, MD

## 2021-03-07 NOTE — Patient Instructions (Addendum)
Electrodesiccation and Curettage ("Scrape and Burn") Wound Care Instructions  Leave the original bandage on for 24 hours if possible.  If the bandage becomes soaked or soiled before that time, it is OK to remove it and examine the wound.  A small amount of post-operative bleeding is normal.  If excessive bleeding occurs, remove the bandage, place gauze over the site and apply continuous pressure (no peeking) over the area for 30 minutes. If this does not work, please call our clinic as soon as possible or page your doctor if it is after hours.   Once a day, cleanse the wound with soap and water. It is fine to shower. If a thick crust develops you may use a Q-tip dipped into dilute hydrogen peroxide (mix 1:1 with water) to dissolve it.  Hydrogen peroxide can slow the healing process, so use it only as needed.    After washing, apply petroleum jelly (Vaseline) or an antibiotic ointment if your doctor prescribed one for you, followed by a bandage.    For best healing, the wound should be covered with a layer of ointment at all times. If you are not able to keep the area covered with a bandage to hold the ointment in place, this may mean re-applying the ointment several times a day.  Continue this wound care until the wound has healed and is no longer open. It may take several weeks for the wound to heal and close.  Itching and mild discomfort is normal during the healing process.  If you have any discomfort, you can take Tylenol (acetaminophen) or ibuprofen as directed on the bottle. (Please do not take these if you have an allergy to them or cannot take them for another reason).  Some redness, tenderness and white or yellow material in the wound is normal healing.  If the area becomes very sore and red, or develops a thick yellow-green material (pus), it may be infected; please notify us.    Wound healing continues for up to one year following surgery. It is not unusual to experience pain in the scar  from time to time during the interval.  If the pain becomes severe or the scar thickens, you should notify the office.    A slight amount of redness in a scar is expected for the first six months.  After six months, the redness will fade and the scar will soften and fade.  The color difference becomes less noticeable with time.  If there are any problems, return for a post-op surgery check at your earliest convenience.  To improve the appearance of the scar, you can use silicone scar gel, cream, or sheets (such as Mederma or Serica) every night for up to one year. These are available over the counter (without a prescription).  Please call our office at [phone] for any questions or concerns.  If you have any questions or concerns for your doctor, please call our main line at [phone] and press option 4 to reach your doctor's medical assistant. If no one answers, please leave a voicemail as directed and we will return your call as soon as possible. Messages left after 4 pm will be answered the following business day.   You may also send [place] a message via [place]. We typically respond to MyChart messages within 1-2 business days.  For prescription refills, please ask your pharmacy to contact our office. Our fax number is [phone].  If you have an urgent issue when the clinic is closed that  cannot wait until the next business day, you can page your doctor at the number below.    Please note that while we do our best to be available for urgent issues outside of office hours, we are not available 24/7.   If you have an urgent issue and are unable to reach [place], you may choose to seek medical care at your doctor's office, retail clinic, urgent care center, or emergency room.  If you have a medical emergency, please immediately call 911 or go to the emergency department.  Pager Numbers  - Dr. Nehemiah Massed: [phone]  - Dr. Laurence Ferrari: [phone]  - Dr. Nicole Kindred: [phone]  In the event  of inclement weather, please call our main line at [phone] for an update on the status of any delays or closures.  Dermatology Medication Tips: Please keep the boxes that topical medications come in in order to help keep track of the instructions about where and how to use these. Pharmacies typically print the medication instructions only on the boxes and not directly on the medication tubes.   If your medication is too expensive, please contact our office at [phone] option 4 or send [place] a message through [place].   We are unable to tell what your co-pay for medications will be in advance as this is different depending on your insurance coverage. However, we may be able to find a substitute medication at lower cost or fill out paperwork to get insurance to cover a needed medication.   If a prior authorization is required to get your medication covered by your insurance company, please allow [place] 1-2 business days to complete this process.  Drug prices often vary depending on where the prescription is filled and some pharmacies may offer cheaper prices.  The website www.goodrx.com contains coupons for medications through different pharmacies. The prices here do not account for what the cost may be with help from insurance (it may be cheaper with your insurance), but the website can give you the price if you did not use any insurance.  - You can print the associated coupon and take it with your prescription to the pharmacy.  - You may also stop by our office during regular business hours and pick up a GoodRx coupon card.  - If you need your prescription sent electronically to a different pharmacy, notify our office through [facility] or by phone at [phone] option 4.

## 2021-03-08 ENCOUNTER — Telehealth: Payer: Self-pay | Admitting: *Deleted

## 2021-03-08 ENCOUNTER — Ambulatory Visit
Payer: No Typology Code available for payment source | Attending: Student in an Organized Health Care Education/Training Program | Admitting: Student in an Organized Health Care Education/Training Program

## 2021-03-08 ENCOUNTER — Encounter: Payer: Self-pay | Admitting: Student in an Organized Health Care Education/Training Program

## 2021-03-08 DIAGNOSIS — G894 Chronic pain syndrome: Secondary | ICD-10-CM

## 2021-03-08 DIAGNOSIS — M47816 Spondylosis without myelopathy or radiculopathy, lumbar region: Secondary | ICD-10-CM

## 2021-03-08 NOTE — Telephone Encounter (Signed)
Attempted to call for pre procedure instructions, Message left.

## 2021-03-08 NOTE — Progress Notes (Signed)
Patient: [name]  Service Category: E/M  Provider: Gillis Santa, [name]  DOB: 07-04-1966  DOS: 03/08/2021  Location: Office  MRN: [id number]  Setting: Ambulatory outpatient  Referring Provider: Adin Hector, [name]  Type: Established Patient  Specialty: Interventional Pain Management  PCP: [name] Hector, [name]  Location: Home  Delivery: TeleHealth     Virtual [name] - Pain Management PROVIDER NOTE: Information contained herein reflects review and annotations entered in association with [name]. Interpretation of such information and data should be left to medically-trained personnel. Information provided to patient can be located elsewhere in the medical record under "Patient Instructions". Document created using STT-dictation technology, any transcriptional errors that may result from process are unintentional.    Contact & Pharmacy Preferred: [place]: [phone] (home) Mobile: [phone] (mobile) E-mail: [name]  [name] [name]Phone: [phone]: [phone] [id number] [place] [address]: [phone]: [phone]  Pre-screening  [name] "in-person" vs "virtual" [name]. [name].   Reason COVID-19*  Social distancing based on CDC and AMA recommendations.   I contacted [name] 03/08/2021 via video conference.      I clearly identified myself as [name]. I verified that I was speaking with the correct person using two identifiers (Name: SLennell [name] and date of birth: 211-21-1967.  Consent I sought verbal advanced consent from [name] virtual visit interactions. I informed Mr. HGuedesof possible security and privacy  [name], risks, and limitations associated with providing "not-in-person" medical evaluation and management services. I also informed [name]. Finally, I informed him that there would be [name] charge for the virtual visit and that [name] could be  personally, fully or partially, financially responsible for it. [name].   Historic Elements   Mr. SDoyt [name] [name] 55y.o. year [name], [name] after our last contact on 01/30/2021. [name] past medical history of Anxiety, Arthritis, Atrial fibrillation ([place] (1989), Basal cell carcinoma (06/23/2020), Basal cell carcinoma (12/28/2020), Bicuspid aortic valve, Cardiomyopathy (08/2009), Depression, GERD (gastroesophageal reflux disease), Gout, Heat stroke (1998), Hyperlipidemia, Hypertension, Seizures ([place] (1998), and throat cancer. [name] also  has [name] past surgical history that includes Cardioversion (1989); Cardiac catheterization (03/2010); and Total knee arthroplasty (Left). [name] [name] current medication list which includes the following prescription(s): allopurinol, atorvastatin, b complex vitamins, carvedilol, cholecalciferol, cyclobenzaprine, empagliflozin, entresto, fenofibrate, fluoxetine, ibuprofen, levothyroxine, levothyroxine, magnesium, melatonin, multivitamin, mupirocin ointment, niacinamide, fish oil, rivaroxaban, theratears, turmeric, vitamin c, amoxicillin, atorvastatin, colchicine, dapagliflozin propanediol, docusate sodium, hydrocodone-acetaminophen, meclizine, and mirtazapine. [name]  reports that [name] has never smoked. [name] has never used smokeless tobacco. [name] reports current alcohol use of about 2.0 - 3.0 standard drinks of alcohol per week. [name] reports that [name] does not use drugs. Mr. HTuckermanhas No Known Allergies.   HPI  [name], [name] is being contacted for [name] post-procedure assessment.   Post-Procedure Evaluation  Procedure  (01/30/2021):   Type: Thermal Lumbar Facet, Medial Branch Radiofrequency Ablation/Neurotomy  #3 (#2 done 05/04/20) Primary Purpose: Therapeutic Region: Posterolateral Lumbosacral Spine Level:  L3, L4, L5, Medial Branch Level(s). These levels will denervate the L3-4, L4-5,lumbar facet  joints. Laterality: Left   10/24/2020 Type: Thermal Lumbar Facet, Medial Branch Radiofrequency Ablation/Neurotomy  #3 (#2 done 05/25/2019) Primary Purpose: Therapeutic Region: Posterolateral Lumbosacral Spine Level:  L3, L4, L5, Medial Branch Level(s). These levels will denervate the L3-4, L4-5,lumbar facet joints. Laterality: Right  Anxiolysis: Please see nurses note.  Effectiveness during initial hour after procedure (Ultra-Short Term Relief): 100 %   Local anesthetic used: Long-acting (4-6 hours) Effectiveness: Defined as any analgesic benefit obtained secondary to the administration of local anesthetics. This carries significant diagnostic value as to the etiological location, or anatomical origin, of the pain. Duration of benefit is expected to coincide with the duration of the local anesthetic used.  Effectiveness during initial 4-6 hours after procedure (Short-Term Relief): 100 %   Long-term benefit: Defined as any relief past the pharmacologic duration of the local anesthetics.  Effectiveness past the initial 6 hours after procedure (Long-Term Relief): 50 %   Benefits, current: Defined as benefit present at the time of this evaluation.   Analgesia:  50% Function: Somewhat improved ROM: Somewhat improved   Laboratory Chemistry Profile   Renal Lab Results  Component Value Date   BUN 24 10/07/2020   CREATININE 1.02 10/07/2020   BCR 24 (H) 10/07/2020   GFR 77.83 06/26/2016   GFRAA >60 10/13/2019   GFRNONAA >60 10/13/2019    Hepatic Lab Results  Component Value Date   AST 34 05/09/2017   ALT 34 05/09/2017   ALBUMIN 4.5 05/09/2017   ALKPHOS 66 05/09/2017   HCVAB NEG 10/29/2008   AMYLASE  41 11/06/2008   LIPASE 19 11/06/2008    Electrolytes Lab Results  Component Value Date   NA 139 10/07/2020   K 4.5 10/07/2020   CL 102 10/07/2020   CALCIUM 9.6 10/07/2020   MG 2.1 04/16/2013   PHOS 2.5 06/13/2010    Bone No results found for: VD25OH, VD125OH2TOT, ZJ6734LP3, XT0240XB3, 25OHVITD1, 25OHVITD2, 25OHVITD3, TESTOFREE, TESTOSTERONE  Inflammation (CRP: Acute Phase) (ESR: Chronic Phase) Lab Results  Component Value Date   CRP 2.7 07/27/2014   ESRSEDRATE 9 07/27/2014         Note: Above Lab results reviewed.   Assessment  The primary [name] diagnosis was Lumbar facet arthropathy. Diagnoses of Lumbar spondylosis and Chronic pain syndrome were also pertinent to this visit.  Plan of Care    Mr. Senan Urey has [name] current medication list which includes the following long-term medication(s): fenofibrate, fluoxetine, levothyroxine, levothyroxine, rivaroxaban, and mirtazapine.  Postprocedural evaluation after left and right lumbar radiofrequency ablation performed 01/30/2021, 10/24/2020 respectively.  Patient endorses approximately 80% pain relief for the first 6 weeks after ablation.  [name] continues to workout and do home stretching as well as exercise.  [name] states that [name] is having increased right low back pain.  His right L3-L4-L5 RFA was on 10/24/2020.  Patient would like to repeat this.  We can plan on doing that 6 months after his previous RFA which would be after 04/26/2021.  Plan: -Right L3, L4, L5 RFA without sedation  Orders:  Orders Placed This [name]  Procedures   Radiofrequency,Lumbar    Standing Status:   Future    Standing Expiration Date:   03/08/2022    Scheduling Instructions:     Side(s): Right-sided     Level: L3-4, L4-5, & L5-S1 Facets (L3, L4, L5, Medial Branch Nerves)     Sedation: No Sedation.     Scheduling Timeframe: after 04/26/21    Order Specific Question:   Where will this procedure  be performed?    Answer:   [facility]     Follow-up plan:   Return in about 2 months (around 05/08/2021) for Right L3, L4, L5 RFA without sedation.     Status post right L3, L4, L5 RFA on 10/20/2018, status post left L3, L4, L5 RFA on 02/23/2019.  Helpful in reducing his axial low back pain and improving his functional status.  Can repeat PRN after 6 months. L3/4 ESI 4/5/211 01/11/20 for low back + radiating hip pain, hip xray and SI joint xray largely unremarkable      Recent Visits Date Type Provider Dept  01/30/21 Procedure visit [name] Santa, [name] [facility]  Showing recent visits within past 90 days and meeting all other requirements [name]'s Visits Date Type Provider Dept  03/08/21 Telemedicine [name] Santa, [name] [facility]  Showing [name]'s visits and meeting all other requirements Future [name] No visits were found meeting these conditions. Showing future [name] within next 90 days and meeting all other requirements I discussed the assessment and treatment plan with the patient. The patient was provided an opportunity to ask questions and all were answered. The patient agreed with the plan and demonstrated an understanding of the instructions.  Patient advised to call back or seek an in-person evaluation if the symptoms or condition worsens.  Duration of [name]: 49mnutes.  Note by: [name] Date: 03/08/2021; Time: 2:52 PM

## 2021-03-08 NOTE — Patient Instructions (Signed)

## 2021-03-15 ENCOUNTER — Encounter: Payer: Self-pay | Admitting: Dermatology

## 2021-03-24 ENCOUNTER — Telehealth: Payer: No Typology Code available for payment source | Admitting: Internal Medicine

## 2021-05-08 ENCOUNTER — Ambulatory Visit
Payer: No Typology Code available for payment source | Admitting: Student in an Organized Health Care Education/Training Program

## 2021-05-17 ENCOUNTER — Encounter: Payer: Self-pay | Admitting: Student in an Organized Health Care Education/Training Program

## 2021-05-17 ENCOUNTER — Ambulatory Visit (HOSPITAL_BASED_OUTPATIENT_CLINIC_OR_DEPARTMENT_OTHER)
Payer: No Typology Code available for payment source | Admitting: Student in an Organized Health Care Education/Training Program

## 2021-05-17 ENCOUNTER — Ambulatory Visit
Admission: RE | Admit: 2021-05-17 | Discharge: 2021-05-17 | Disposition: A | Discharge status: Home / Self Care | Payer: No Typology Code available for payment source | Source: Ambulatory Visit | Attending: Student in an Organized Health Care Education/Training Program | Admitting: Student in an Organized Health Care Education/Training Program

## 2021-05-17 ENCOUNTER — Ambulatory Visit: Payer: No Typology Code available for payment source | Admitting: Dermatology

## 2021-05-17 ENCOUNTER — Other Ambulatory Visit: Payer: Self-pay

## 2021-05-17 VITALS — BP 108/71 | HR 70 | Temp 97.0°F | Resp 16 | Ht 71.0 in | Wt 250.0 lb

## 2021-05-17 DIAGNOSIS — M47816 Spondylosis without myelopathy or radiculopathy, lumbar region: Secondary | ICD-10-CM | POA: Insufficient documentation

## 2021-05-17 DIAGNOSIS — G894 Chronic pain syndrome: Secondary | ICD-10-CM

## 2021-05-17 MED ORDER — DEXAMETHASONE SODIUM PHOSPHATE 10 MG/ML IJ SOLN
10.0000 mg | Freq: Once | INTRAMUSCULAR | Status: AC
  Administered 2021-05-17: 10 mg

## 2021-05-17 MED ORDER — DEXAMETHASONE SODIUM PHOSPHATE 10 MG/ML IJ SOLN
INTRAMUSCULAR | Status: AC
  Filled 2021-05-17: qty 1

## 2021-05-17 MED ORDER — ROPIVACAINE HCL 2 MG/ML IJ SOLN
INTRAMUSCULAR | Status: AC
  Filled 2021-05-17: qty 20

## 2021-05-17 MED ORDER — ROPIVACAINE HCL 2 MG/ML IJ SOLN
9.0000 mL | Freq: Once | INTRAMUSCULAR | Status: AC
  Administered 2021-05-17: 20 mL via PERINEURAL

## 2021-05-17 MED ORDER — LIDOCAINE HCL 2 % IJ SOLN
20.0000 mL | Freq: Once | INTRAMUSCULAR | Status: AC
  Administered 2021-05-17: 400 mg

## 2021-05-17 NOTE — Patient Instructions (Addendum)
______________________________________________________________________  ___________  ____________________________________________________________________________________________  Post-procedure Information What to expect: Most procedures involve the use of a local anesthetic (numbing medicine), and a steroid (anti-inflammatory medicine).  The local anesthetics may cause temporary numbness and weakness of the legs or arms, depending on the location of the block. This numbness/weakness may last 4-6 hours, depending on the local anesthetic used. In rare instances, it can last up to 24 hours. While numb, you must be very careful not to injure the extremity.  After any procedure, you could expect the pain to get better within 15-20 minutes. This relief is temporary and may last 4-6 hours. Once the local anesthetics wears off, you could experience discomfort, possibly more than usual, for up to 10 (ten) days. In the case of radiofrequencies, it may last up to 6 weeks. Surgeries may take up to 8 weeks for the healing process. The discomfort is due to the irritation caused by needles going through skin and muscle. To minimize the discomfort, we recommend using ice the first day, and heat from then on. The ice should be applied for 15 minutes on, and 15 minutes off. Keep repeating this cycle until bedtime. Avoid applying the ice directly to the skin, to prevent frostbite. Heat should be used daily, until the pain improves (4-10 days). Be careful not to burn yourself.  Occasionally you may experience muscle spasms or cramps. These occur as a consequence of the irritation caused by the needle sticks to the muscle and the blood that will inevitably be lost into the surrounding muscle tissue. Blood tends to be very irritating to tissues, which tend to react by going into spasm. These spasms may start the same day of your procedure, but they may also take days to develop. This late onset type of spasm or cramp is  usually caused by electrolyte imbalances triggered by the steroids, at the level of the kidney. Cramps and spasms tend to respond well to muscle relaxants, multivitamins (some are triggered by the procedure, but may have their origins in vitamin deficiencies), and "Gatorade", or any sports drinks that can replenish any electrolyte imbalances. (If you are a diabetic, ask your pharmacist to get you a sugar-free brand.) Warm showers or baths may also be helpful. Stretching exercises are highly recommended.  General Instructions:  Be alert for signs of possible infection: redness, swelling, heat, red streaks, elevated temperature, and/or fever. These typically appear 4 to 6 days after the procedure. Immediately notify your doctor if you experience unusual bleeding, difficulty breathing, or loss of bowel or bladder control. If you experience increased pain, do not increase your pain medicine intake, unless instructed by your pain physician.  Post-Procedure Care:  Be careful in moving about. Muscle spasms in the area of the injection may occur. Applying ice or heat to the area is often helpful. The incidence of spinal headaches after epidural injections ranges between 1.4% and 6%. If you develop a headache that does not seem to respond to conservative therapy, please let your physician know. This can be treated with an epidural blood patch.   Post-procedure numbness or redness is to be expected, however it should average 4 to 6 hours. If numbness and weakness of your extremities begins to develop 4 to 6 hours after your procedure, and is felt to be progressing and worsening, immediately contact your physician.  Diet:  If you experience nausea, do not eat until this sensation goes away. If you had a "Stellate Ganglion Block" for upper extremity "Reflex Sympathetic Dystrophy",  do not eat or drink until your hoarseness goes away. In any case, always start with liquids first and if you tolerate them well, then  slowly progress to more solid foods.  Activity:  For the first 4 to 6 hours after the procedure, use caution in moving about as you may experience numbness and/or weakness. Use caution in cooking, using household electrical appliances, and climbing steps. If you need to reach your Doctor call our office: [phone] (During business hours) or [phone] (After business hours).  Business Hours: Monday-Thursday 8:00 am - 4:00 PM    Fridays: Closed     In case of an emergency: In case of emergency, call 911 or go to the nearest emergency room and have the physician there call us.  Interpretation of Procedure Every nerve block has two components: a diagnostic component, and a treatment component. Unrealistic expectations are the most common causes of "perceived failure".  In a perfect world, a single nerve block should be able to completely and permanently eliminate the pain. Sadly, the world is not perfect.  Most pain management nerve blocks are performed using local anesthetics and steroids. Steroids are responsible for any long-term benefit that you may experience. Their purpose is to decrease any chronic swelling that may exist in the area. Steroids begin to work immediately after being injected. However, most patients will not experience any benefits until 5 to 10 days after the injection, when the swelling has come down to the point where they can tell a difference. Steroids will only help if there is swelling to be treated. As such, they can assist with the diagnosis. If effective, they suggest an inflammatory component to the pain, and if ineffective, they rule out inflammation as the main cause or component of the problem. If the problem is one of mechanical compression, you will get no benefit from those steroids.   In the case of local anesthetics, they have a crucial role in the diagnosis of your condition. Most will begin to work within15 to 20 minutes after injection. The duration  will depend on the type used (short- vs. Long-acting). It is of outmost importance that patients keep tract of their pain, after the procedure. To assist with this matter, a "Post-procedure Pain Diary" is provided. Make sure to complete it and to bring it back to your follow-up appointment.  As long as the patient keeps accurate, detailed records of their symptoms after every procedure, and returns to have those interpreted, every procedure will provide [place] with invaluable information. Even a block that does not provide the patient with any relief, will always provide [place] with information about the mechanism and the origin of the pain. The only time a nerve block can be considered a waste of time is when patients do not keep track of the results, or do not keep their post-procedure appointment.  Reporting the results back to your physician The Pain Score  Pain is a subjective complaint. It cannot be seen, touched, or measured. We depend entirely on the patient's report of the pain in order to assess your condition and treatment. To evaluate the pain, we use a pain scale, where "0" means "No Pain", and a "10" is "the worst possible pain that you can even imagine" (i.e. something like been eaten alive by a shark or being torn apart by a lion).   Use the Pain Scale provided. You will frequently be asked to rate your pain. Please be accurate, remember that medical decisions will  be based on your responses. Please do not rate your pain above a 10. Doing so is actually interpreted as "symptom magnification" (exaggeration). To put this into perspective, when you tell us that your pain is at a 10 (ten), what you are saying is that there is nothing we can do to make this pain any worse. (Carefully think about that.) ____________________________________________________________________________________________  ______________________________________________________________________  Preparing for your procedure  (without sedation)  Procedure appointments are limited to planned procedures: No Prescription Refills. No disability issues will be discussed. No medication changes will be discussed.  Instructions: Oral Intake: Do not eat or drink anything for at least 6 hours prior to your procedure. (Exception: Blood Pressure Medication. See below.) Transportation: Unless otherwise stated by your physician, you may drive yourself after the procedure. Blood Pressure Medicine: Do not forget to take your blood pressure medicine with a sip of water the morning of the procedure. If your Diastolic (lower reading)is above 100 mmHg, elective cases will be cancelled/rescheduled. Blood thinners: These will need to be stopped for procedures. Notify our staff if you are taking any blood thinners. Depending on which one you take, there will be specific instructions on how and when to stop it. Diabetics on insulin: Notify the staff so that you can be scheduled 1st case in the morning. If your diabetes requires high dose insulin, take only  of your normal insulin dose the morning of the procedure and notify the staff that you have done so. Preventing infections: Shower with an antibacterial soap the morning of your procedure.  Build-up your immune system: Take 1000 mg of Vitamin C with every meal (3 times a day) the day prior to your procedure. Antibiotics: Inform the staff if you have a condition or reason that requires you to take antibiotics before dental procedures. Pregnancy: If you are pregnant, call and cancel the procedure. Sickness: If you have a cold, fever, or any active infections, call and cancel the procedure. Arrival: You must be in the facility at least 30 minutes prior to your scheduled procedure. Children: Do not bring any children with you. Dress appropriately: Bring dark clothing that you would not mind if they get stained. Valuables: Do not bring any jewelry or valuables.  Reasons to call and  reschedule or cancel your procedure: (Following these recommendations will minimize the risk of a serious complication.) Surgeries: Avoid having procedures within 2 weeks of any surgery. (Avoid for 2 weeks before or after any surgery). Flu Shots: Avoid having procedures within 2 weeks of a flu shots or . (Avoid for 2 weeks before or after immunizations). Barium: Avoid having a procedure within 7-10 days after having had a radiological study involving the use of radiological contrast. (Myelograms, Barium swallow or enema study). Heart attacks: Avoid any elective procedures or surgeries for the initial 6 months after a "Myocardial Infarction" (Heart Attack). Blood thinners: It is imperative that you stop these medications before procedures. Let us know if you if you take any blood thinner.  Infection: Avoid procedures during or within two weeks of an infection (including chest colds or gastrointestinal problems). Symptoms associated with infections include: Localized redness, fever, chills, night sweats or profuse sweating, burning sensation when voiding, cough, congestion, stuffiness, runny nose, sore throat, diarrhea, nausea, vomiting, cold or Flu symptoms, recent or current infections. It is specially important if the infection is over the area that we intend to treat. Heart and lung problems: Symptoms that may suggest an active cardiopulmonary problem include: cough, chest pain, breathing  difficulties or shortness of breath, dizziness, ankle swelling, uncontrolled high or unusually low blood pressure, and/or palpitations. If you are experiencing any of these symptoms, cancel your procedure and contact your primary care physician for an evaluation.  Remember:  Regular Business hours are:  Monday to Thursday 8:00 AM to 4:00 PM  Provider's Schedule: Milinda Pointer, MD:  Procedure days: Tuesday and Thursday 7:30 AM to 4:00 PM  Gillis Santa, MD:  Procedure days: Monday and Wednesday 7:30 AM to 4:00  PM ______________________________________________________________________

## 2021-05-17 NOTE — Progress Notes (Signed)
0349 Maintains hypotension. Skin cool, diaphoretic. Pt. Asking to sit up. Requested Giniger-Ale, having mild nausea. After about 5 min post procedure, states he is beginning to feel better. Transferred to Recovery Room. Dr. Holley Raring notified.

## 2021-05-17 NOTE — Progress Notes (Signed)
0904 Brought to recovery room r/t low blood pressure, pale in color and sweating. PO fluids x2 given and wet washcloth. Patient a&o x3. "I feel a lot better".  6333 skin color normal, VS back to baseline, Patient able to stand with no problems. Patient discharged.

## 2021-05-17 NOTE — Progress Notes (Signed)
Safety precautions to be maintained throughout the outpatient stay will include: orient to surroundings, keep bed in low position, maintain call bell within reach at all times, provide assistance with transfer out of bed and ambulation.    Al Decant, RN

## 2021-05-17 NOTE — Progress Notes (Signed)
Patient's Name: [name]  MRN: [id number]  Referring Provider: Adin Hector, MD  DOB: [birth year]  PCP: Adin Hector, MD  DOS: 05/17/2021  Note by: Gillis Santa, MD  Service setting: Ambulatory outpatient  Specialty: Interventional Pain Management  Patient type: Established  Location: [facility] (AMB) Pain Management Facility  Visit type: Interventional Procedure   Primary Reason for Visit: Interventional Pain Management Treatment. CC: Back Pain  Procedure:          Anesthesia, Analgesia, Anxiolysis:  Type: Thermal Lumbar Facet, Medial Branch Radiofrequency Ablation/Neurotomy  #4(#3 done 10/24/20) Primary Purpose: Therapeutic Region: Posterolateral Lumbosacral Spine Level:  L3, L4, L5, Medial Branch Level(s). These levels will denervate the L3-4, L4-5,lumbar facet joints. Laterality: Right  Type: Local Anesthesia  Local Anesthetic: Lidocaine 1-2%  Position: Prone   Indications: 1. Lumbar facet arthropathy   2. Lumbar spondylosis   3. Chronic pain syndrome     [name] has been dealing with the above chronic pain for longer than three months and has either failed to respond, was unable to tolerate, or simply did not get enough benefit from other more conservative therapies including, but not limited to: 1. Over-the-counter medications 2. Anti-inflammatory medications 3. Muscle relaxants 4. Membrane stabilizers 5. Opioids 6. Physical therapy and/or chiropractic manipulation 7. Modalities (Heat, ice, etc.) 8. Invasive techniques such as nerve blocks. [name] has attained more than 50% relief of the pain from a series of diagnostic injections conducted in separate occasions.  Pain Score: Pre-procedure: 6 /10 Post-procedure: 2 /10  Stopped Xarelto 3 days ago  Pre-op Assessment:  [name] is a 55 y.o. (year old), male patient, seen today for interventional treatment. He  has a past surgical history that includes Cardioversion (1989); Cardiac catheterization  (03/2010); and Total knee arthroplasty (Left). [name] has a current medication list which includes the following prescription(s): allopurinol, atorvastatin, b complex vitamins, carvedilol, cholecalciferol, cyclobenzaprine, empagliflozin, entresto, fenofibrate, fluoxetine, ibuprofen, levothyroxine, magnesium, melatonin, multivitamin, mupirocin ointment, niacinamide, fish oil, theratears, turmeric, vitamin c, amoxicillin, atorvastatin, colchicine, dapagliflozin propanediol, docusate sodium, hydrocodone-acetaminophen, levothyroxine, meclizine, mirtazapine, and rivaroxaban. His primarily concern today is the Back Pain  Initial Vital Signs:  Pulse/HCG Rate: 85ECG Heart Rate: 72 Temp: (!) 97 F (36.1 C) Resp: 18 BP: 107/72 SpO2: 96 %  BMI: Estimated body mass index is 34.87 kg/m as calculated from the following:   Height as of this encounter: 5\' 11"  (1.803 m).   Weight as of this encounter: 250 lb (113.4 kg).  Risk Assessment: Allergies: Reviewed. He has No Known Allergies.  Allergy Precautions: None required Coagulopathies: Reviewed. None identified.  Blood-thinner therapy: None at this time Active Infection(s): Reviewed. None identified. [name] is afebrile  Site Confirmation: [name] was asked to confirm the procedure and laterality before marking the site Procedure checklist: Completed Consent: Before the procedure and under the influence of no sedative(s), amnesic(s), or anxiolytics, the patient was informed of the treatment options, risks and possible complications. To fulfill our ethical and legal obligations, as recommended by the American Medical Association's Code of Ethics, I have informed the patient of my clinical impression; the nature and purpose of the treatment or procedure; the risks, benefits, and possible complications of the intervention; the alternatives, including doing nothing; the risk(s) and benefit(s) of the alternative treatment(s) or procedure(s); and the  risk(s) and benefit(s) of doing nothing. The patient was provided information about the general risks and possible complications associated with the procedure. These may include, but are not  limited to: failure to achieve desired goals, infection, bleeding, organ or nerve damage, allergic reactions, paralysis, and death. In addition, the patient was informed of those risks and complications associated to Spine-related procedures, such as failure to decrease pain; infection (i.e.: Meningitis, epidural or intraspinal abscess); bleeding (i.e.: epidural hematoma, subarachnoid hemorrhage, or any other type of intraspinal or peri-dural bleeding); organ or nerve damage (i.e.: Any type of peripheral nerve, nerve root, or spinal cord injury) with subsequent damage to sensory, motor, and/or autonomic systems, resulting in permanent pain, numbness, and/or weakness of one or several areas of the body; allergic reactions; (i.e.: anaphylactic reaction); and/or death. Furthermore, the patient was informed of those risks and complications associated with the medications. These include, but are not limited to: allergic reactions (i.e.: anaphylactic or anaphylactoid reaction(s)); adrenal axis suppression; blood sugar elevation that in diabetics may result in ketoacidosis or comma; water retention that in patients with history of congestive heart failure may result in shortness of breath, pulmonary edema, and decompensation with resultant heart failure; weight gain; swelling or edema; medication-induced neural toxicity; particulate matter embolism and blood vessel occlusion with resultant organ, and/or nervous system infarction; and/or aseptic necrosis of one or more joints. Finally, the patient was informed that Medicine is not an exact science; therefore, there is also the possibility of unforeseen or unpredictable risks and/or possible complications that may result in a catastrophic outcome. The patient indicated having  understood very clearly. We have given the patient no guarantees and we have made no promises. Enough time was given to the patient to ask questions, all of which were answered to the patient's satisfaction. [name] has indicated that he wanted to continue with the procedure. Attestation: I, the ordering provider, attest that I have discussed with the patient the benefits, risks, side-effects, alternatives, likelihood of achieving goals, and potential problems during recovery for the procedure that I have provided informed consent. Date  Time:   Pre-Procedure Preparation:  Monitoring: As per clinic protocol. Respiration, ETCO2, SpO2, BP, heart rate and rhythm monitor placed and checked for adequate function Safety Precautions: Patient was assessed for positional comfort and pressure points before starting the procedure. Time-out: I initiated and conducted the "Time-out" before starting the procedure, as per protocol. The patient was asked to participate by confirming the accuracy of the "Time Out" information. Verification of the correct person, site, and procedure were performed and confirmed by me, the nursing staff, and the patient. "Time-out" conducted as per Joint Commission's Universal Protocol (UP.01.01.01). Time: 0626  Description of Procedure:          Laterality: Right Levels: L3, L4, L5,Medial Branch Level(s), at the L3-4, L4-5,  lumbar facet joints. Area Prepped: Lumbosacral Prepping solution: ChloraPrep (2% chlorhexidine gluconate and 70% isopropyl alcohol) Safety Precautions: Aspiration looking for blood return was conducted prior to all injections. At no point did we inject any substances, as a needle was being advanced. Before injecting, the patient was told to immediately notify me if he was experiencing any new onset of "ringing in the ears, or metallic taste in the mouth". No attempts were made at seeking any paresthesias. Safe injection practices and needle disposal techniques  used. Medications properly checked for expiration dates. SDV (single dose vial) medications used. After the completion of the procedure, all disposable equipment used was discarded in the proper designated medical waste containers. Local Anesthesia: Protocol guidelines were followed. The patient was positioned over the fluoroscopy table. The area was prepped in the usual manner. The time-out was  completed. The target area was identified using fluoroscopy. A 12-in long, straight, sterile hemostat was used with fluoroscopic guidance to locate the targets for each level blocked. Once located, the skin was marked with an approved surgical skin marker. Once all sites were marked, the skin (epidermis, dermis, and hypodermis), as well as deeper tissues (fat, connective tissue and muscle) were infiltrated with a small amount of a short-acting local anesthetic, loaded on a 10cc syringe with a 25G, 1.5-in  Needle. An appropriate amount of time was allowed for local anesthetics to take effect before proceeding to the next step. Local Anesthetic: Lidocaine 2.0% The unused portion of the local anesthetic was discarded in the proper designated containers. Technical explanation of process:  Radiofrequency Ablation (RFA)  L3 Medial Branch Nerve RFA: The target area for the L3 medial branch is at the junction of the postero-lateral aspect of the superior articular process and the superior, posterior, and medial edge of the transverse process of L4. Under fluoroscopic guidance, a Radiofrequency needle was inserted until contact was made with os over the superior postero-lateral aspect of the pedicular shadow (target area). Sensory and motor testing was conducted to properly adjust the position of the needle. Once satisfactory placement of the needle was achieved, the numbing solution was slowly injected after negative aspiration for blood. 31mL of the nerve block solution was injected without difficulty or complication. After  waiting for at least 3 minutes, the ablation was performed. Once completed, the needle was removed intact. L4 Medial Branch Nerve RFA: The target area for the L4 medial branch is at the junction of the postero-lateral aspect of the superior articular process and the superior, posterior, and medial edge of the transverse process of L5. Under fluoroscopic guidance, a Radiofrequency needle was inserted until contact was made with os over the superior postero-lateral aspect of the pedicular shadow (target area). Sensory and motor testing was conducted to properly adjust the position of the needle. Once satisfactory placement of the needle was achieved, the numbing solution was slowly injected after negative aspiration for blood. 32mL of the nerve block solution was injected without difficulty or complication. After waiting for at least 3 minutes, the ablation was performed. Once completed, the needle was removed intact. L5 Medial Branch Nerve RFA: The target area for the L5 medial branch is at the junction of the postero-lateral aspect of the superior articular process of S1 and the superior, posterior, and medial edge of the sacral ala. Under fluoroscopic guidance, a Radiofrequency needle was inserted until contact was made with os over the superior postero-lateral aspect of the pedicular shadow (target area). Sensory and motor testing was conducted to properly adjust the position of the needle. Once satisfactory placement of the needle was achieved, the numbing solution was slowly injected after negative aspiration for blood.2 mL of the nerve block solution was injected without difficulty or complication. After waiting for at least 3 minutes, the ablation was performed. Once completed, the needle was removed intact.   Radiofrequency lesioning (ablation):  Radiofrequency Generator: NeuroTherm [id number] Sensory Stimulation Parameters: 50 Hz was used to locate & identify the nerve, making sure that the needle was  positioned such that there was no sensory stimulation below 0.3 V or above 0.7 V. Motor Stimulation Parameters: 2 Hz was used to evaluate the motor component. Care was taken not to lesion any nerves that demonstrated motor stimulation of the lower extremities at an output of less than 2.5 times that of the sensory threshold, or a maximum of  2.0 V. Lesioning Technique Parameters: Standard Radiofrequency settings. (Not bipolar or pulsed.) Temperature Settings: 80 degrees C Lesioning time: 60 seconds Intra-operative Compliance: Compliant Materials & Medications: Needle(s) (Electrode/Cannula) Type: Teflon-coated, curved tip, Radiofrequency needle(s) Gauge: 22G Length: 10cm Numbing solution: 6 cc solution made of 5 cc of 0.2% ropivacaine, 1 cc of Decadron 10 mg/cc.  2 cc injected at each level above on the right prior to ablation.  The unused portion of the solution was discarded in the proper designated containers.  Once the entire procedure was completed, the treated area was cleaned, making sure to leave some of the prepping solution back to take advantage of its long term bactericidal properties.  Illustration of the posterior view of the lumbar spine and the posterior neural structures. Laminae of L2 through S1 are labeled. DPRL5, dorsal primary ramus of L5; DPRS1, dorsal primary ramus of S1; DPR3, dorsal primary ramus of L3; FJ, facet (zygapophyseal) joint L3-L4; I, inferior articular process of L4; LB1, lateral branch of dorsal primary ramus of L1; IAB, inferior articular branches from L3 medial branch (supplies L4-L5 facet joint); IBP, intermediate branch plexus; MB3, medial branch of dorsal primary ramus of L3; NR3, third lumbar nerve root; S, superior articular process of L5; SAB, superior articular branches from L4 (supplies L4-5 facet joint also); TP3, transverse process of L3.  Vitals:   05/17/21 0845 05/17/21 0850 05/17/21 0904 05/17/21 0912  BP: (!) 75/45 (!) 76/46 102/67 108/71  Pulse:    72 70  Resp: 10 14 16 16   Temp:      TempSrc:      SpO2: 96% 96% 97%   Weight:      Height:        Start Time: 0833 hrs. End Time: 0849 hrs.  Imaging Guidance (Spinal):          Type of Imaging Technique: Fluoroscopy Guidance (Spinal) Indication(s): Assistance in needle guidance and placement for procedures requiring needle placement in or near specific anatomical locations not easily accessible without such assistance. Exposure Time: Please see nurses notes. Contrast: None used. Fluoroscopic Guidance: I was personally present during the use of fluoroscopy. "Tunnel Vision Technique" used to obtain the best possible view of the target area. Parallax error corrected before commencing the procedure. "Direction-depth-direction" technique used to introduce the needle under continuous pulsed fluoroscopy. Once target was reached, antero-posterior, oblique, and lateral fluoroscopic projection used confirm needle placement in all planes. Images permanently stored in EMR. Interpretation: No contrast injected. I personally interpreted the imaging intraoperatively. Adequate needle placement confirmed in multiple planes. Permanent images saved into the patient's record.   Post-operative Assessment:  Post-procedure Vital Signs:  Pulse/HCG Rate: 7066 Temp: (!) 97 F (36.1 C) Resp: 16 BP: 108/71 SpO2: 97 %  EBL: None  Complications: No immediate post-treatment complications observed by team, or reported by patient.  Note: The patient tolerated the entire procedure well. A repeat set of vitals were taken after the procedure and the patient was kept under observation following institutional policy, for this type of procedure. Post-procedural neurological assessment was performed, showing return to baseline, prior to discharge. The patient was provided with post-procedure discharge instructions, including a section on how to identify potential problems. Should any problems arise concerning this  procedure, the patient was given instructions to immediately contact us, at any time, without hesitation. In any case, we plan to contact the patient by telephone for a follow-up status report regarding this interventional procedure.  Comments:  No additional relevant information.  Plan of  Care  Orders:  Orders Placed This Encounter  Procedures   DG PAIN CLINIC C-ARM 1-60 MIN NO REPORT    Intraoperative interpretation by procedural physician at [place].    Standing Status:   Standing    Number of Occurrences:   1    Order Specific Question:   Reason for exam:    Answer:   Assistance in needle guidance and placement for procedures requiring needle placement in or near specific anatomical locations not easily accessible without such assistance.    Patient instructed to restart Xarelto tomorrow so long as he is not having any LE weakness.    Medications ordered for procedure: Meds ordered this encounter  Medications   ropivacaine (PF) 2 mg/mL (0.2%) (NAROPIN) injection 9 mL   dexamethasone (DECADRON) injection 10 mg   lidocaine (XYLOCAINE) 2 % (with pres) injection 400 mg    Medications administered: We administered ropivacaine (PF) 2 mg/mL (0.2%), dexamethasone, and lidocaine.  See the medical record for exact dosing, route, and time of administration.  Disposition: Discharge home  Discharge Date & Time: 05/17/2021; 0900 hrs.   Follow-up plan:   Return in about 2 weeks (around 05/31/2021) for Left L3, 4, 5 RFA , without sedation.     Future Appointments  Date Time Provider [place]  06/14/2021 10:00 AM Gillis Santa, MD [facility] None  08/10/2021  2:20 PM Alfonso Patten, MD [facility] None   Primary Care Physician: Adin Hector, MD Location: [facility] Note by: Gillis Santa, MD Date: 05/17/2021; Time: 9:18 AM  Disclaimer:  Medicine is not an exact science. The only guarantee in medicine is that nothing is guaranteed. It  is important to note that the decision to proceed with this intervention was based on the information collected from the patient. The Data and conclusions were drawn from the patient's questionnaire, the interview, and the physical examination. Because the information was provided in large part by the patient, it cannot be guaranteed that it has not been purposely or unconsciously manipulated. Every effort has been made to obtain as much relevant data as possible for this evaluation. It is important to note that the conclusions that lead to this procedure are derived in large part from the available data. Always take into account that the treatment will also be dependent on availability of resources and existing treatment guidelines, considered by other Pain Management Practitioners as being common knowledge and practice, at the time of the intervention. For Medico-Legal purposes, it is also important to point out that variation in procedural techniques and pharmacological choices are the acceptable norm. The indications, contraindications, technique, and results of the above procedure should only be interpreted and judged by a Board-Certified Interventional Pain Specialist with extensive familiarity and expertise in the same exact procedure and technique.

## 2021-05-18 ENCOUNTER — Encounter: Payer: Self-pay | Admitting: *Deleted

## 2021-05-18 ENCOUNTER — Encounter: Payer: Self-pay | Admitting: Student in an Organized Health Care Education/Training Program

## 2021-05-18 ENCOUNTER — Telehealth: Payer: Self-pay | Admitting: *Deleted

## 2021-05-18 NOTE — Telephone Encounter (Signed)
Post procedure call.  Voicemail left with patient to please call if there are any questions or concerns.

## 2021-06-14 ENCOUNTER — Ambulatory Visit
Payer: No Typology Code available for payment source | Admitting: Student in an Organized Health Care Education/Training Program

## 2021-07-05 ENCOUNTER — Ambulatory Visit
Payer: No Typology Code available for payment source | Admitting: Student in an Organized Health Care Education/Training Program

## 2021-08-08 ENCOUNTER — Other Ambulatory Visit: Payer: Self-pay

## 2021-08-08 ENCOUNTER — Ambulatory Visit (INDEPENDENT_AMBULATORY_CARE_PROVIDER_SITE_OTHER): Payer: No Typology Code available for payment source | Admitting: Dermatology

## 2021-08-08 ENCOUNTER — Encounter: Payer: Self-pay | Admitting: Dermatology

## 2021-08-08 DIAGNOSIS — D229 Melanocytic nevi, unspecified: Secondary | ICD-10-CM

## 2021-08-08 DIAGNOSIS — L570 Actinic keratosis: Secondary | ICD-10-CM | POA: Diagnosis not present

## 2021-08-08 DIAGNOSIS — L578 Other skin changes due to chronic exposure to nonionizing radiation: Secondary | ICD-10-CM

## 2021-08-08 DIAGNOSIS — Z85828 Personal history of other malignant neoplasm of skin: Secondary | ICD-10-CM

## 2021-08-08 DIAGNOSIS — Z1283 Encounter for screening for malignant neoplasm of skin: Secondary | ICD-10-CM | POA: Diagnosis not present

## 2021-08-08 DIAGNOSIS — L814 Other melanin hyperpigmentation: Secondary | ICD-10-CM

## 2021-08-08 DIAGNOSIS — L821 Other seborrheic keratosis: Secondary | ICD-10-CM

## 2021-08-08 DIAGNOSIS — D1800 Hemangioma unspecified site: Secondary | ICD-10-CM

## 2021-08-08 NOTE — Progress Notes (Signed)
Follow-Up Visit   Subjective  [name] is a 56 y.o. male who presents for the following: Follow-up (Patient here today for wart follow up and to have [employer] site checked. Patient had wart at left forearm treated with LN2 and advises he thinks it has cleared. Patient had bx proven BCC treated with EDC at right upper arm last visit. ) and FBSE (Patient here for full body skin exam and skin cancer screening. Patient with hx of BCC. Patient does have a spot at right cheek that sometimes itches, present for a few months. ).   The following portions of the chart were reviewed this encounter and updated as appropriate:   Tobacco   Allergies   Meds   Problems   Med Hx   Surg Hx   Fam Hx       Review of Systems:  No other skin or systemic complaints except as noted in HPI or Assessment and Plan.  Objective  Well appearing patient in no apparent distress; mood and affect are within normal limits.  A full examination was performed including scalp, head, eyes, ears, nose, lips, neck, chest, axillae, abdomen, back, buttocks, bilateral upper extremities, bilateral lower extremities, hands, feet, fingers, toes, fingernails, and toenails. All findings within normal limits unless otherwise noted below.  Right Forearm x 1, upper mid back to right of scar x 1 (2) Erythematous thin papules/macules with gritty scale.     Assessment & Plan  AK (actinic keratosis) (2) Right Forearm x 1, upper mid back to right of scar x 1  Prior to procedure, discussed risks of blister formation, small wound, skin dyspigmentation, or rare scar following cryotherapy. Recommend Vaseline ointment to treated areas while healing.  Actinic keratoses are precancerous spots that appear secondary to cumulative UV radiation exposure/sun exposure over time. They are chronic with expected duration over 1 year. A portion of actinic keratoses will progress to squamous cell carcinoma of the skin. It is not possible to reliably  predict which spots will progress to skin cancer and so treatment is recommended to prevent development of skin cancer.  Recommend daily broad spectrum sunscreen SPF 30+ to sun-exposed areas, reapply every 2 hours as needed.  Recommend staying in the shade or wearing long sleeves, sun glasses (UVA+UVB protection) and wide brim hats (4-inch brim around the entire circumference of the hat). Call for new or changing lesions.   Destruction of lesion - Right Forearm x 1, upper mid back to right of scar x 1  Destruction method: cryotherapy   Informed consent: discussed and consent obtained   Lesion destroyed using liquid nitrogen: Yes   Cryotherapy cycles:  2 Outcome: patient tolerated procedure well with no complications   Post-procedure details: wound care instructions given     History of Basal Cell Carcinoma of the Skin - No evidence of recurrence today  - Recommend regular full body skin exams - Recommend daily broad spectrum sunscreen SPF 30+ to sun-exposed areas, reapply every 2 hours as needed.  - Call if any new or changing lesions are noted between office visits - Recommend Niacinamide or Nicotinamide 500mg  twice per day to lower risk of non-melanoma skin cancer by approximately 25%. This is usually available at [employer].  Lentigines - Scattered tan macules - Due to sun exposure - Benign-appearing, observe - Recommend daily broad spectrum sunscreen SPF 30+ to sun-exposed areas, reapply every 2 hours as needed. - Call for any changes  Seborrheic Keratoses - Stuck-on, waxy, tan-brown papules and/or  plaques  - Benign-appearing - Discussed benign etiology and prognosis. - Observe - Call for any changes  Melanocytic Nevi - Tan-brown and/or pink-flesh-colored symmetric macules and papules - Benign appearing on exam today - Observation - Call clinic for new or changing moles - Recommend daily use of broad spectrum spf 30+ sunscreen to sun-exposed areas.    Hemangiomas - Red papules - Discussed benign nature - Observe - Call for any changes  Actinic Damage - chronic, secondary to cumulative UV radiation exposure/sun exposure over time - diffuse scaly erythematous macules with underlying dyspigmentation - Recommend daily broad spectrum sunscreen SPF 30+ to sun-exposed areas, reapply every 2 hours as needed.  - Recommend staying in the shade or wearing long sleeves, sun glasses (UVA+UVB protection) and wide brim hats (4-inch brim around the entire circumference of the hat). - Call for new or changing lesions.  Skin cancer screening performed today.   Return for TBSE 6-12 months, as scheduled for AK follow up.  Graciella Belton, RMA, am acting as scribe for Forest Gleason, MD .  Documentation: I have reviewed the above documentation for accuracy and completeness, and I agree with the above.  Forest Gleason, MD

## 2021-08-08 NOTE — Patient Instructions (Addendum)
Recommend Niacinamide or Nicotinamide 500mg  twice per day to lower risk of non-melanoma skin cancer by approximately 25%. This is usually available at [employer].   Cryotherapy Aftercare  Wash gently with soap and water everyday.   Apply Vaseline and Band-Aid daily until healed.   Prior to procedure, discussed risks of blister formation, small wound, skin dyspigmentation, or rare scar following cryotherapy. Recommend Vaseline ointment to treated areas while healing.   Melanoma ABCDEs  Melanoma is the most dangerous type of skin cancer, and is the leading cause of death from skin disease.  You are more likely to develop melanoma if you: Have light-colored skin, light-colored eyes, or red or blond hair Spend a lot of time in the sun Tan regularly, either outdoors or in a tanning bed Have had blistering sunburns, especially during childhood Have a close family member who has had a melanoma Have atypical moles or large birthmarks  Early detection of melanoma is key since treatment is typically straightforward and cure rates are extremely high if we catch it early.   The first sign of melanoma is often a change in a mole or a new dark spot.  The ABCDE system is a way of remembering the signs of melanoma.  A for asymmetry:  The two halves do not match. B for border:  The edges of the growth are irregular. C for color:  A mixture of colors are present instead of an even brown color. D for diameter:  Melanomas are usually (but not always) greater than 26mm - the size of a pencil eraser. E for evolution:  The spot keeps changing in size, shape, and color.  Please check your skin once per month between visits. You can use a small mirror in front and a large mirror behind you to keep an eye on the back side or your body.   If you see any new or changing lesions before your next follow-up, please call to schedule a visit.  Please continue daily skin protection including broad spectrum  sunscreen SPF 30+ to sun-exposed areas, reapplying every 2 hours as needed when you're outdoors.    If You Need Anything After Your Visit  If you have any questions or concerns for your doctor, please call our main line at [phone] and press option 4 to reach your doctor's medical assistant. If no one answers, please leave a voicemail as directed and we will return your call as soon as possible. Messages left after 4 pm will be answered the following business day.   You may also send [place] a message via [place]. We typically respond to MyChart messages within 1-2 business days.  For prescription refills, please ask your pharmacy to contact our office. Our fax number is [phone].  If you have an urgent issue when the clinic is closed that cannot wait until the next business day, you can page your doctor at the number below.    Please note that while we do our best to be available for urgent issues outside of office hours, we are not available 24/7.   If you have an urgent issue and are unable to reach [place], you may choose to seek medical care at your doctor's office, retail clinic, urgent care center, or emergency room.  If you have a medical emergency, please immediately call 911 or go to the emergency department.  Pager Numbers  - Dr. Nehemiah Massed: [phone]  - Dr. Laurence Ferrari: [phone]  - Dr. Nicole Kindred: [phone]  In the event of inclement  weather, please call our main line at [phone] for an update on the status of any delays or closures.  Dermatology Medication Tips: Please keep the boxes that topical medications come in in order to help keep track of the instructions about where and how to use these. Pharmacies typically print the medication instructions only on the boxes and not directly on the medication tubes.   If your medication is too expensive, please contact our office at [phone] option 4 or send [place] a message through [place].   We are unable to tell what your  co-pay for medications will be in advance as this is different depending on your insurance coverage. However, we may be able to find a substitute medication at lower cost or fill out paperwork to get insurance to cover a needed medication.   If a prior authorization is required to get your medication covered by your insurance company, please allow [place] 1-2 business days to complete this process.  Drug prices often vary depending on where the prescription is filled and some pharmacies may offer cheaper prices.  The website www.goodrx.com contains coupons for medications through different pharmacies. The prices here do not account for what the cost may be with help from insurance (it may be cheaper with your insurance), but the website can give you the price if you did not use any insurance.  - You can print the associated coupon and take it with your prescription to the pharmacy.  - You may also stop by our office during regular business hours and pick up a GoodRx coupon card.  - If you need your prescription sent electronically to a different pharmacy, notify our office through [facility] or by phone at [phone] option 4.     Si Usted Necesita Algo Despus de Su Visita  Tambin puede enviarnos un mensaje a travs de Pharmacist, community. Por lo general respondemos a los mensajes de MyChart en el transcurso de 1 a 2 das hbiles.  Para renovar recetas, por favor pida a su farmacia que se ponga en contacto con nuestra oficina. Harland Dingwall de fax es [place] [phone].  Si tiene un asunto urgente cuando la clnica est cerrada y que no puede esperar hasta el siguiente da hbil, puede llamar/localizar a su doctor(a) al nmero que aparece a continuacin.   Por favor, tenga en cuenta que aunque hacemos todo lo posible para estar disponibles para asuntos urgentes fuera del horario de [place], no estamos disponibles las 24 horas del da, los 7 das de la [place].   Si tiene un problema urgente y no  puede comunicarse con nosotros, puede optar por buscar atencin mdica  en el consultorio de su doctor(a), en una clnica privada, en un centro de atencin urgente o en una sala de emergencias.  Si tiene Engineering geologist, por favor llame inmediatamente al 911 o vaya a la sala de emergencias.  Nmeros de bper  - Dr. Nehemiah Massed: [phone]  - Dra. Moye: [phone]  - Dra. Nicole Kindred: [phone]  En caso de inclemencias del [place], por favor llame a Johnsie Kindred principal al [phone] para una actualizacin sobre el [place] de cualquier retraso o cierre.  Consejos para la medicacin en dermatologa: Por favor, guarde las cajas en las que vienen los medicamentos de uso tpico para ayudarle a seguir las instrucciones sobre dnde y cmo usarlos. Las farmacias generalmente imprimen las instrucciones del medicamento slo en las cajas y no directamente en los tubos del [place].   Si su medicamento es [employer]  caro, por favor, pngase en contacto con nuestra oficina llamando al [phone] y presione la opcin 4 o envenos un mensaje a travs de Pharmacist, community.   No podemos decirle cul ser su copago por los medicamentos por adelantado ya que esto es diferente dependiendo de la cobertura de su seguro. Sin embargo, es posible que podamos encontrar un medicamento sustituto a Electrical engineer un formulario para que el seguro cubra el medicamento que se considera necesario.   Si se requiere una autorizacin previa para que su compaa de seguros [place] su medicamento, por favor permtanos de 1 a 2 das hbiles para completar este proceso.  Los precios de los medicamentos varan con frecuencia dependiendo del Environmental consultant de dnde se surte la receta y alguna farmacias pueden ofrecer precios ms baratos.  El sitio web www.goodrx.com tiene cupones para medicamentos de Airline pilot. Los precios aqu no tienen en cuenta lo que podra costar con la ayuda del seguro (puede ser ms barato con su seguro),  pero el sitio web puede darle el precio si no utiliz Research scientist (physical sciences).  - Puede imprimir el cupn correspondiente y llevarlo con su receta a la farmacia.  - Tambin puede pasar por nuestra oficina durante el horario de atencin regular y Charity fundraiser una tarjeta de cupones de GoodRx.  - Si necesita que su receta se enve electrnicamente a una farmacia diferente, informe a nuestra oficina a travs de MyChart de [place] o por telfono llamando al [phone] y presione la opcin 4.

## 2021-08-09 ENCOUNTER — Ambulatory Visit
Payer: No Typology Code available for payment source | Admitting: Student in an Organized Health Care Education/Training Program

## 2021-08-10 ENCOUNTER — Ambulatory Visit: Payer: No Typology Code available for payment source | Admitting: Dermatology

## 2021-08-16 ENCOUNTER — Ambulatory Visit
Payer: No Typology Code available for payment source | Admitting: Student in an Organized Health Care Education/Training Program

## 2021-08-17 ENCOUNTER — Ambulatory Visit: Payer: No Typology Code available for payment source | Admitting: Dermatology

## 2021-09-19 ENCOUNTER — Ambulatory Visit: Payer: No Typology Code available for payment source | Admitting: Dermatology

## 2021-10-03 ENCOUNTER — Ambulatory Visit: Payer: No Typology Code available for payment source | Admitting: Dermatology

## 2021-10-25 ENCOUNTER — Encounter: Payer: Self-pay | Admitting: Student in an Organized Health Care Education/Training Program

## 2021-11-30 ENCOUNTER — Telehealth: Payer: Self-pay

## 2021-11-30 NOTE — Telephone Encounter (Signed)
LM for patient to call office for pre virtual appointment questions.  

## 2021-12-04 ENCOUNTER — Ambulatory Visit
Payer: No Typology Code available for payment source | Attending: Student in an Organized Health Care Education/Training Program | Admitting: Student in an Organized Health Care Education/Training Program

## 2021-12-04 DIAGNOSIS — M47816 Spondylosis without myelopathy or radiculopathy, lumbar region: Secondary | ICD-10-CM

## 2021-12-04 NOTE — Progress Notes (Signed)
I attempted to call the patient however no response. Voicemail left instructing patient to call front desk office at [phone] to reschedule appointment. -Dr Deyanira Fesler  

## 2021-12-05 ENCOUNTER — Encounter: Payer: Self-pay | Admitting: Student in an Organized Health Care Education/Training Program

## 2021-12-05 ENCOUNTER — Ambulatory Visit
Payer: No Typology Code available for payment source | Attending: Student in an Organized Health Care Education/Training Program | Admitting: Student in an Organized Health Care Education/Training Program

## 2021-12-05 DIAGNOSIS — G894 Chronic pain syndrome: Secondary | ICD-10-CM

## 2021-12-05 DIAGNOSIS — M47816 Spondylosis without myelopathy or radiculopathy, lumbar region: Secondary | ICD-10-CM

## 2021-12-05 NOTE — Progress Notes (Signed)
Patient: [name]  Service Category: E/M  Provider: Gillis Santa, MD  ?DOB: [birth year]  DOS: 12/05/2021  Location: Office  ?MRN: [id number]  Setting: Ambulatory outpatient  Referring Provider: Adin Hector, MD  ?Type: Established Patient  Specialty: Interventional Pain Management  PCP: Adin Hector, MD  ?Location: Home  Delivery: TeleHealth    ? ?Virtual Encounter - Pain Management ?PROVIDER NOTE: Information contained herein reflects review and annotations entered in association with encounter. Interpretation of such information and data should be left to medically-trained personnel. Information provided to patient can be located elsewhere in the medical record under "Patient Instructions". Document created using STT-dictation technology, any transcriptional errors that may result from process are unintentional.  ?  ?Contact & Pharmacy ?Preferred: [phone] ?Home: [phone] (home) ?Mobile: [phone] (mobile) ?E-mail: [email or web address]  ?[id number] [place]?Phone: [phone]: [phone]? ?[id number] [address] [place]?Phone: [phone]: [phone]?  ?Pre-screening  ?Mr. HYsidro Evertoffered "in-person" vs "virtual" encounter. He indicated preferring virtual for this encounter.  ? ?Reason ?COVID-19*  Social distancing based on CDC and AMA recommendations.  ? ?I contacted SEvlyn Clineson 12/05/2021 via telephone.      I clearly identified myself as BGillis Santa MD. I verified that I was speaking with the correct person using two identifiers (Name: SAlando Colleran and date of birth: [birth year]. ? ?Consent ?I sought verbal advanced consent from SEvlyn Clinesfor virtual visit interactions. I informed Mr. HSakumaof possible security and privacy  concerns, risks, and limitations associated with providing "not-in-person" medical evaluation and management services. I also informed Mr. HOwensbyof the availability of "in-person" appointments. Finally, I informed him that there would be a charge for the virtual visit and that he could be  personally, fully or partially, financially responsible for it. Mr. HCzerwinskiexpressed understanding and agreed to proceed.  ? ?Historic Elements   ?Mr. STarren Velardiis a 56y.o. year old, male patient evaluated today after his last contact with our practice on 02/18/2020. Mr. HCrean has a past medical history of Anxiety, Arthritis, Atrial fibrillation ([place]), Basal cell carcinoma (06/23/2020), Basal cell carcinoma (12/28/2020), Bicuspid aortic valve, Cardiomyopathy (08/2009), Depression, GERD (gastroesophageal reflux disease), Gout, Heat stroke (1998), Hyperlipidemia, Hypertension, Seizures ([place]1998), and throat cancer. He also  has a past surgical history that includes Cardioversion (1989); Cardiac catheterization (03/2010); and Total knee arthroplasty (Left). Mr. HMaronehas a current medication list which includes the following prescription(s): allopurinol, atorvastatin, b complex vitamins, carvedilol, cholecalciferol, cyclobenzaprine, docusate sodium, empagliflozin, entresto, fenofibrate, ibuprofen, levothyroxine, magnesium, meclizine, melatonin, mirtazapine, multivitamin, mupirocin ointment, niacinamide er, fish oil, rivaroxaban, theratears, turmeric, and vitamin c. He  reports that he has never smoked. He has never used smokeless tobacco. He reports current alcohol use of about 2.0 - 3.0 standard drinks per week. He reports that he does not use drugs. Mr. HBoutellehas No Known Allergies.  ? ?HPI  ?Today, he is being contacted for worsening of previously known (established) problem ? ?SAnnie Mainpresents with increased axial low back pain that is worse with lumbar extension and facet loading.  Patient's prior lumbar  L3, L4, L5 radiofrequency ablation was performed 05/17/2021 for the right side and 01/10/2021 for the left side. Given that it has been greater than 6 months  since his previous radiofrequency ablation, we discussed repeating RFA to help with his low back pain related to lumbar facet arthropathy.  Patient receives greater than 50% pain relief after his lumbar radiofrequency ablations that last 4 to 6 months with gradual return of pain thereafter.  We will plan on repeating starting with the right side first.  Patient instructed to stop Xarelto 3 days prior to scheduled procedure. ? ? ?Laboratory Chemistry Profile  ? ?Renal ?Lab Results  ?Component Value Date  ? BUN 24 10/07/2020  ? CREATININE 1.02 10/07/2020  ? BCR 24 (H) 10/07/2020  ? GFR 77.83 06/26/2016  ? GFRAA >60 10/13/2019  ? GFRNONAA >60 10/13/2019  ? ?  Hepatic ?Lab Results  ?Component Value Date  ? AST 34 05/09/2017  ? ALT 34 05/09/2017  ? ALBUMIN 4.5 05/09/2017  ? ALKPHOS 66 05/09/2017  ? HCVAB NEG 10/29/2008  ? AMYLASE 41 11/06/2008  ? LIPASE 19 11/06/2008  ? ?  ?Electrolytes ?Lab Results  ?Component Value Date  ? NA 139 10/07/2020  ? K 4.5 10/07/2020  ? CL 102 10/07/2020  ? CALCIUM 9.6 10/07/2020  ? MG 2.1 04/16/2013  ? PHOS 2.5 06/13/2010  ? ?  Bone ?No results found for: [place], [id number], [id number], [id number], 25OHVITD1, 25OHVITD2, 25OHVITD3, TESTOFREE, TESTOSTERONE ?  ?Inflammation (CRP: Acute Phase) (ESR: Chronic Phase) ?Lab Results  ?Component Value Date  ? CRP 2.7 07/27/2014  ? ESRSEDRATE 9 07/27/2014  ? ?    ?Note: Above Lab results reviewed. ? ? ? ?Assessment  ?The primary encounter diagnosis was Lumbar facet arthropathy. Diagnoses of Lumbar spondylosis and Chronic pain syndrome were also pertinent to this visit. ? ?Plan of Care  ? ?Mr. [name] has a current medication list which includes the following long-term medication(s): fenofibrate, levothyroxine, mirtazapine, and rivaroxaban. ? ?Repeat lumbar radiofrequency ablation starting  with the right side first which was previously done on 05/17/21 and provided greater than 50% pain relief for 6 months now with gradual return of pain thereafter.  We will plan on doing the left side 2 weeks after the right side.  Risks and benefits of this procedure reviewed and patient would like to proceed.  Instructed patient to stop Xarelto 3 days prior to scheduled procedure.  Patient endorsed understanding. ? ?Orders:  ?Orders Placed This Encounter  ?Procedures  ? Radiofrequency,Lumbar  ?  Standing Status:   Future  ?  Standing Expiration Date:   12/06/2022  ?  Scheduling Instructions:  ?   Side(s): RIGHT  ?   Level: L3-4, L4-5,  Facets ( L3, L4, L5, & S1 Medial Branch Nerves)  ?   Sedation: without  ?   Scheduling Timeframe: As soon as pre-approved  ?  Order Specific Question:   Where will this procedure be performed?  ?  Answer:   [facility]  ? Radiofrequency,Lumbar  ?  Standing Status:   Future  ?  Standing Expiration Date:   12/06/2022  ?  Scheduling Instructions:  ?   Side(s): LEFT  ?   Level: L3-4, L4-5,  Facets ( L3, L4, L5, & S1 Medial Branch Nerves)  ?   Sedation: without  ?   Scheduling Timeframe: 2 weeks after right  ?  Order Specific Question:   Where will this procedure be performed?  ?  Answer:   [facility]  ? ?Follow-up plan:   ?Return in about 1 week (around 12/12/2021) for R  L3, 4, 5 RFA , in clinic NS.   ? ?  Recent Visits ?Date Type Provider Dept  ?12/04/21 Office Visit Gillis Santa, MD [facility]  ?Showing recent visits within past 90 days and meeting all other requirements ?Today's Visits ?Date Type Provider Dept  ?12/05/21 Office Visit Gillis Santa, MD [facility]  ?Showing today's visits and meeting all other requirements ?Future Appointments ?No visits were found meeting these conditions. ?Showing future appointments within next 90 days and meeting all other requirements ? ?I discussed the assessment and treatment plan with the patient. The patient  was provided an opportunity to ask questions and all were answered. The patient agreed with the plan and demonstrated an understanding of the instructions. ? ?Patient advised to call back or seek an in-person

## 2021-12-18 ENCOUNTER — Other Ambulatory Visit: Payer: Self-pay

## 2021-12-18 ENCOUNTER — Ambulatory Visit
Admission: RE | Admit: 2021-12-18 | Discharge: 2021-12-18 | Disposition: A | Discharge status: Home / Self Care | Payer: No Typology Code available for payment source | Source: Ambulatory Visit | Attending: Student in an Organized Health Care Education/Training Program | Admitting: Student in an Organized Health Care Education/Training Program

## 2021-12-18 ENCOUNTER — Encounter: Payer: Self-pay | Admitting: Student in an Organized Health Care Education/Training Program

## 2021-12-18 ENCOUNTER — Ambulatory Visit (HOSPITAL_BASED_OUTPATIENT_CLINIC_OR_DEPARTMENT_OTHER)
Payer: No Typology Code available for payment source | Admitting: Student in an Organized Health Care Education/Training Program

## 2021-12-18 VITALS — BP 124/91 | HR 70 | Temp 97.7°F | Resp 16 | Ht 70.0 in | Wt 250.0 lb

## 2021-12-18 DIAGNOSIS — M47816 Spondylosis without myelopathy or radiculopathy, lumbar region: Secondary | ICD-10-CM | POA: Diagnosis present

## 2021-12-18 MED ORDER — LIDOCAINE HCL 2 % IJ SOLN
INTRAMUSCULAR | Status: AC
  Filled 2021-12-18: qty 20

## 2021-12-18 MED ORDER — ROPIVACAINE HCL 2 MG/ML IJ SOLN
INTRAMUSCULAR | Status: AC
  Filled 2021-12-18: qty 20

## 2021-12-18 MED ORDER — DEXAMETHASONE SODIUM PHOSPHATE 10 MG/ML IJ SOLN
10.0000 mg | Freq: Once | INTRAMUSCULAR | Status: AC
  Administered 2021-12-18: 10 mg

## 2021-12-18 MED ORDER — DEXAMETHASONE SODIUM PHOSPHATE 10 MG/ML IJ SOLN
INTRAMUSCULAR | Status: AC
  Filled 2021-12-18: qty 1

## 2021-12-18 MED ORDER — LIDOCAINE HCL 2 % IJ SOLN
20.0000 mL | Freq: Once | INTRAMUSCULAR | Status: AC
  Administered 2021-12-18: 400 mg

## 2021-12-18 MED ORDER — ROPIVACAINE HCL 2 MG/ML IJ SOLN
9.0000 mL | Freq: Once | INTRAMUSCULAR | Status: AC
  Administered 2021-12-18: 9 mL via PERINEURAL

## 2021-12-18 NOTE — Progress Notes (Signed)
Safety precautions to be maintained throughout the outpatient stay will include: orient to surroundings, keep bed in low position, maintain call bell within reach at all times, provide assistance with transfer out of bed and ambulation.  

## 2021-12-18 NOTE — Patient Instructions (Signed)

## 2021-12-18 NOTE — Progress Notes (Signed)
Patient's Name: [name]  MRN: [id number]  ?Referring Provider: Adin Hector, MD  DOB: 02-18-1966  ?PCP: Adin Hector, MD  DOS: 12/18/2021  ?Note by: Gillis Santa, MD  Service setting: Ambulatory outpatient  ?Specialty: Interventional Pain Management  Patient type: Established  ?Location: [facility] (AMB) Pain Management Facility  Visit type: Interventional Procedure  ? ?Primary Reason for Visit: Interventional Pain Management Treatment. ?CC: Back Pain (Low and right) ? ? ?Procedure:          Anesthesia, Analgesia, Anxiolysis:  ?Type: Thermal Lumbar Facet, Medial Branch Radiofrequency Ablation/Neurotomy  #5 (#4 done 05/17/21) ?Primary Purpose: Therapeutic ?Region: Posterolateral Lumbosacral Spine ?Level:  L3, L4, L5, Medial Branch Level(s). These levels will denervate the L3-4, L4-5,lumbar facet joints. ?Laterality: Right  Type: Local Anesthesia ? ?Local Anesthetic: Lidocaine 1-2% ? ?Position: Prone  ? ?Indications: ?1. Lumbar facet arthropathy   ?2. Lumbar spondylosis   ? ? ?[name] has been dealing with the above chronic pain for longer than three months and has either failed to respond, was unable to tolerate, or simply did not get enough benefit from other more conservative therapies including, but not limited to: ?1. Over-the-counter medications ?2. Anti-inflammatory medications ?3. Muscle relaxants ?4. Membrane stabilizers ?5. Opioids ?6. Physical therapy and/or chiropractic manipulation ?7. Modalities (Heat, ice, etc.) ?8. Invasive techniques such as nerve blocks. ?[name] has attained more than 50% relief of the pain from a series of diagnostic injections conducted in separate occasions. ? ?Pain Score: ?Pre-procedure: 6 /10 ?Post-procedure: 3 /10 ? ?Stopped Xarelto 3 days ago ? ?Pre-op Assessment:  ?[name] is a 56 y.o. (year old), male patient, seen today for interventional treatment. He  has a past surgical history that includes Cardioversion (1989); Cardiac catheterization (03/2010);  and Total knee arthroplasty (Left). [name] has a current medication list which includes the following prescription(s): allopurinol, b complex vitamins, carvedilol, cholecalciferol, cyclobenzaprine, docusate sodium, empagliflozin, entresto, fenofibrate, ibuprofen, levothyroxine, meclizine, melatonin, multivitamin, mupirocin ointment, niacinamide er, fish oil, rivaroxaban, theratears, turmeric, vitamin c, atorvastatin, magnesium, and mirtazapine. His primarily concern today is the Back Pain (Low and right) ? ? ?Initial Vital Signs:  ?Pulse/HCG Rate: 75  ?Temp: 97.7 ?F (36.5 ?C) ?Resp: 15 ?BP: 111/83 ?SpO2: 100 % ? ?BMI: Estimated body mass index is 35.87 kg/m? as calculated from the following: ?  Height as of this encounter: '5\' 10"'$  (1.778 m). ?  Weight as of this encounter: 250 lb (113.4 kg). ? ?Risk Assessment: ?Allergies: Reviewed. He has No Known Allergies.  ?Allergy Precautions: None required ?Coagulopathies: Reviewed. None identified.  ?Blood-thinner therapy: None at this time ?Active Infection(s): Reviewed. None identified. [name] is afebrile ? ?Site Confirmation: [name] was asked to confirm the procedure and laterality before marking the site ?Procedure checklist: Completed ?Consent: Before the procedure and under the influence of no sedative(s), amnesic(s), or anxiolytics, the patient was informed of the treatment options, risks and possible complications. To fulfill our ethical and legal obligations, as recommended by the American Medical Association's Code of Ethics, I have informed the patient of my clinical impression; the nature and purpose of the treatment or procedure; the risks, benefits, and possible complications of the intervention; the alternatives, including doing nothing; the risk(s) and benefit(s) of the alternative treatment(s) or procedure(s); and the risk(s) and benefit(s) of doing nothing. ?The patient was provided information about the general risks and possible complications  associated with the procedure. These may include, but are not limited to: failure to achieve desired goals,  infection, bleeding, organ or nerve damage, allergic reactions, paralysis, and death. ?In addition, the patient was informed of those risks and complications associated to Spine-related procedures, such as failure to decrease pain; infection (i.e.: Meningitis, epidural or intraspinal abscess); bleeding (i.e.: epidural hematoma, subarachnoid hemorrhage, or any other type of intraspinal or peri-dural bleeding); organ or nerve damage (i.e.: Any type of peripheral nerve, nerve root, or spinal cord injury) with subsequent damage to sensory, motor, and/or autonomic systems, resulting in permanent pain, numbness, and/or weakness of one or several areas of the body; allergic reactions; (i.e.: anaphylactic reaction); and/or death. ?Furthermore, the patient was informed of those risks and complications associated with the medications. These include, but are not limited to: allergic reactions (i.e.: anaphylactic or anaphylactoid reaction(s)); adrenal axis suppression; blood sugar elevation that in diabetics may result in ketoacidosis or comma; water retention that in patients with history of congestive heart failure may result in shortness of breath, pulmonary edema, and decompensation with resultant heart failure; weight gain; swelling or edema; medication-induced neural toxicity; particulate matter embolism and blood vessel occlusion with resultant organ, and/or nervous system infarction; and/or aseptic necrosis of one or more joints. ?Finally, the patient was informed that Medicine is not an exact science; therefore, there is also the possibility of unforeseen or unpredictable risks and/or possible complications that may result in a catastrophic outcome. The patient indicated having understood very clearly. We have given the patient no guarantees and we have made no promises. Enough time was given to the patient to  ask questions, all of which were answered to the patient's satisfaction. [name] has indicated that he wanted to continue with the procedure. ?Attestation: I, the ordering provider, attest that I have discussed with the patient the benefits, risks, side-effects, alternatives, likelihood of achieving goals, and potential problems during recovery for the procedure that I have provided informed consent. ?Date  Time:  ? ?Pre-Procedure Preparation:  ?Monitoring: As per clinic protocol. Respiration, ETCO2, SpO2, BP, heart rate and rhythm monitor placed and checked for adequate function ?Safety Precautions: Patient was assessed for positional comfort and pressure points before starting the procedure. ?Time-out: I initiated and conducted the "Time-out" before starting the procedure, as per protocol. The patient was asked to participate by confirming the accuracy of the "Time Out" information. Verification of the correct person, site, and procedure were performed and confirmed by me, the nursing staff, and the patient. "Time-out" conducted as per Joint Commission's Universal Protocol (UP.01.01.01). ?Time: 1309 ? ?Description of Procedure:          ?Laterality: Right ?Levels: L3, L4, L5,Medial Branch Level(s), at the L3-4, L4-5,  lumbar facet joints. ?Area Prepped: Lumbosacral ?Prepping solution: ChloraPrep (2% chlorhexidine gluconate and 70% isopropyl alcohol) ?Safety Precautions: Aspiration looking for blood return was conducted prior to all injections. At no point did we inject any substances, as a needle was being advanced. Before injecting, the patient was told to immediately notify me if he was experiencing any new onset of "ringing in the ears, or metallic taste in the mouth". No attempts were made at seeking any paresthesias. Safe injection practices and needle disposal techniques used. Medications properly checked for expiration dates. SDV (single dose vial) medications used. After the completion of the  procedure, all disposable equipment used was discarded in the proper designated medical waste containers. ?Local Anesthesia: Protocol guidelines were followed. The patient was positioned over the fluoroscopy table.

## 2021-12-19 ENCOUNTER — Telehealth: Payer: Self-pay | Admitting: *Deleted

## 2021-12-19 NOTE — Telephone Encounter (Signed)
Called for post procedure check. No answer. LVM. 

## 2021-12-29 ENCOUNTER — Encounter: Payer: Self-pay | Admitting: Student in an Organized Health Care Education/Training Program

## 2022-01-10 ENCOUNTER — Ambulatory Visit
Payer: No Typology Code available for payment source | Admitting: Student in an Organized Health Care Education/Training Program

## 2022-01-29 ENCOUNTER — Encounter: Payer: Self-pay | Admitting: Student in an Organized Health Care Education/Training Program

## 2022-01-31 ENCOUNTER — Ambulatory Visit
Payer: No Typology Code available for payment source | Admitting: Student in an Organized Health Care Education/Training Program

## 2022-01-31 ENCOUNTER — Ambulatory Visit: Payer: BLUE CROSS/BLUE SHIELD

## 2022-02-01 ENCOUNTER — Telehealth: Payer: BLUE CROSS/BLUE SHIELD

## 2022-02-01 NOTE — Telephone Encounter
PDL Call to Clinic    Reason for Call:Patient calling Lovena Le back to assist in obtaining medical records    Appointment Related?  '[]'$  Yes  '[x]'$  No     If yes;  Date:  Time:    Call warm transferred to PDL: '[x]'$  Yes  '[]'$  No    Call Received by Clinic Representative:Taylor    If call not answered/not accepted, call received by Patient Services Representative:

## 2022-02-01 NOTE — Telephone Encounter
PDL Call to Clinic    Reason for Call:Pt Jeffrey Escobar is returning Lovena Le at [place] call regarding med Rec for upcomming visit coming from [place]?  '[]'$  Yes  '[x]'$  No     If yes;  Date:  Time:    Call warm transferred to PDL: '[x]'$  Yes  '[]'$  No    Call Received by Clinic Representative: Lovena Le  If call not answered/not accepted, call received by Patient Services Representative:

## 2022-02-02 NOTE — Telephone Encounter
PDL Call to Clinic    Reason for Call:Pt is calling in wanting to speak with Lovena Le from [place] team,was transferred to [facility] to further assist     Appointment Related?  '[]'$  Yes  '[x]'$  No     If yes;  Date:  Time:    Call warm transferred to PDL: '[x]'$  Yes  '[]'$  No    Call Received by Clinic Representative: Jonni Sanger    If call not answered/not accepted, call received by Patient Services Representative:

## 2022-02-09 ENCOUNTER — Telehealth: Payer: Self-pay | Admitting: Internal Medicine

## 2022-02-09 ENCOUNTER — Ambulatory Visit: Payer: BLUE CROSS/BLUE SHIELD

## 2022-02-09 DIAGNOSIS — C61 Malignant neoplasm of prostate: Secondary | ICD-10-CM

## 2022-02-09 NOTE — Telephone Encounter (Signed)
Pt c/o of Chest Pain: STAT if CP now or developed within 24 hours  1. Are you having CP right now? No  2. Are you experiencing any other symptoms (ex. SOB, nausea, vomiting, sweating)? No  3. How long have you been experiencing CP? month  4. Is your CP continuous or coming and going? Come and go  5. Have you taken Nitroglycerin? No   ?

## 2022-02-09 NOTE — Progress Notes (Deleted)
Cardiology Office Note    Date:  02/09/2022   ID:  [name], DOB August 15, 1965, MRN [id number]  PCP:  [name] Hector, MD  Cardiologist:  [name] Axe, MD  Electrophysiologist:  [name] Axe, MD   Chief Complaint: ***  History of Present Illness:   [name] is a 56 y.o. 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  He was diagnosed with new onset Afib in 2011 and underwent TEE-guided DCCV with TEE showing an EF of 30%. Repeat echo several months later showed an EF of 40-45%. Due to nonexertional chest discomfort, he underwent Lexiscan MPI in 2011 that showed an EF of 46% with some anterior wall redistribution. Subsequent cardiac cath in 03/2010 showed nonobstructive CAD. Subsequent echoes in 2012 and 2013 showed a persistent cardiomyopathy with an EF ranging between 30 and 35%. Cardiac MRI in 2015 showed an EF of 43% with no LGE. Echo in 2018 showed an EF of 40-45%. Coronary CTA in 10/2019 showed a calcium score of 137 with no evidence of obstructive CAD with a trileaflet aortic valve. CT FFR normal in all distributions. See details below. Echo in 08/2020 showed an EF of 30-35%. Given drop in EF, his [place] cardiologist recommended cardiac cath with notes indicate he underwent cardiac cath through the [facility] in 2022, which was reportedly nonobstructive. He has been followed by our EP service.   He contacted our office on 02/09/2022 noting intermittent chest pain with exertion such as lifting weights, and at rest that were progressive following a family death. Appointment was scheduled for today.   ***   Labs independently reviewed: 12/2021 - TSH normal 10/2020 - BUN 24, serum creatinine 1.02, potassium 4.5, Hgb 12.9, PLT  195 07/2020 - albumin 4.4, AST 73, ALT normal, TC 189, TG 223, HDL 43, LDL 101   Past Medical History:  Diagnosis Date   Anxiety    Arthritis    gout   Atrial fibrillation ([place]) 1989   Basal cell carcinoma 06/23/2020   Upper mid back. Nodular.   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    Allergies:   Patient has no known allergies.   Social History   Socioeconomic History   Marital status: Married    Spouse name: Not on file   Number of children: 1   Years of education: Not on file   Highest education level: Not on file  Occupational  History   Occupation: Sales  Tobacco Use   Smoking status: Never   Smokeless tobacco: Never  Vaping Use   Vaping Use: Never used  Substance and Sexual Activity   Alcohol use: Yes    Alcohol/week: 2.0 - 3.0 standard drinks of alcohol    Types: 2 - 3 Cans of beer per week    Comment: occassional   Drug use: No   Sexual activity: Not on file  Other Topics Concern   Not on file  Social History Narrative   Married with 1 son   Social Determinants of Health   Financial Resource Strain: Not on file  Food Insecurity: Not on file  Transportation Needs: Not on file  Physical Activity: Not on file   Stress: Not on file  Social Connections: Not on file     Family History:  The patient's family history includes Arthritis in his mother; COPD in his mother; Coronary artery disease in his paternal grandfather; Crohn's disease in his brother; Depression in his mother; Diabetes in his mother; Heart attack in his father; Lung cancer in his maternal grandfather, paternal uncle, and paternal uncle; Pancreatic cancer in his paternal uncle; Thyroid cancer in his paternal aunt. There is no history of Hypertension, Colon cancer, Colon polyps, Esophageal cancer, Rectal cancer, or Stomach cancer.  ROS:   ROS   EKGs/Labs/Other Studies Reviewed:    Studies reviewed were summarized above. The additional studies were reviewed today:  2D echo 09/02/2020: 1. Left ventricular ejection fraction, by estimation, is 30 to 35%. The  left ventricle has moderately decreased function. The left ventricle  demonstrates global hypokinesis. Grossly, unable to exclude regional wall  motion abnormality. The left  ventricular internal cavity size was mildly to moderately dilated.   2. Right ventricular systolic function is normal. The right ventricular  size is normal. Tricuspid regurgitation signal is inadequate for assessing  PA pressure.   3. Left atrial size was moderately dilated.   4. The mitral valve is normal in structure. Mild mitral valve  regurgitation.   5. The inferior vena cava is dilated in size with >50% respiratory  variability, suggesting right atrial pressure of 8 mmHg.   6. Challenging images. Definity used. __________  Coronary CTA with CT FFR 10/15/2019: Aorta: Normal size. Ascending aorta 2.9 cm. No calcifications. No dissection.   Aortic Valve:  Trileaflet.  Minimal calcification.   Coronary Arteries:  Normal coronary origin.  Right dominance.   RCA is a small non-dominant artery.  There is no plaque.   Left main is a large artery that gives [name] to LAD, RI, and LCX arteries.    LAD is a large vessel that has mild (25-49%) soft plaque proximally. Scattered minimal (<25%) calcified plaque.   LCX is a large dominant artery that gives [name] to large OM1, OM2 and OM3 branches. There is minimal (<25%) calcified plaque distal to OM3. The LCX gives [name] to the PDA and PLVB.   RI is a branching vessel with moderate (50-69%) calcified plaque at the ostium and mild (25-49%) calcified in the mid vessel.   Other findings:   Normal pulmonary vein drainage into the left atrium.   Normal let atrial appendage without a thrombus.   Normal size of the pulmonary artery.   IMPRESSION: 1. Coronary calcium score of 137. This was 87th percentile for age and sex matched control. 2. Normal coronary origin with left dominance. 3. Moderate (50-69%) calcified plaque in the ostial RI. CAD-RADS 3. 4.  Mild plaque  in the LAD and minimal plaque in the LCX. 5.  Will send study for FFRct. 6.  Aortic valve appears to be trileaflet.  CT FFR -  1. Left Main: findings FFRct 0.99 (normal) 2. LAD: findings FFRct 0.98 proximal, 0.92 mid, 0.86 distal (normal) 3. LCX: findings FFRct 0.97 proximal, 0.96 mid, 0.92 distal (normal) 4. Ramus: findings FFRct 0.99 proximal, 0.95 mid (normal) 5. RCA: findings FFRct 0.99 proximal, 0.92 mid (normal)   IMPRESSION: 1.  FFRct normal in all coronary distributions. 2.  Non-obstructive coronary disease. 3.  Recommend aggressive risk factor modification. __________  Calcium score 11/18/2017: IMPRESSION: Coronary calcium score of 62. This was 61 percentile for age and sex matched control. __________  2D echo 10/25/2017: - Left ventricle: The cavity size was moderately dilated. Systolic    function was moderately reduced. The estimated ejection fraction    was in the range of 35% to 40%. Diffuse hypokinesis. Regional    wall motion abnormalities cannot be excluded. Doppler parameters    are consistent with abnormal left ventricular relaxation (grade 1     diastolic dysfunction).  - Left atrium: The atrium was mildly dilated.  - Right ventricle: Systolic function was normal.  - Pulmonary arteries: Systolic pressure was within the normal    range.   Impressions:   - Rhythm is normal sinus. __________  2D echo 09/24/2016: - Left ventricle: The cavity size was mildly dilated. Systolic    function was mildly to moderately reduced. The estimated ejection    fraction was in the range of 40% to 45%. Mild diffuse    hypokinesis. Regional wall motion abnormalities cannot be    excluded. Left ventricular diastolic function parameters were    normal.  - Mitral valve: There was mild regurgitation.  - Left atrium: The atrium was mildly dilated.  - Right ventricle: Systolic function was normal.  - Pulmonary arteries: Systolic pressure was mildly elevated. PA    peak pressure: 36 mm Hg (S).   Impressions:   - Challenging image quality. Rhythm is normal sinus. __________  ETT 01/07/2015: There was no ST segment deviation noted during stress. Blood pressure demonstrated a hypertensive response to exercise.   Normal treadmill stress test with  excellent exercise capacity. Elevated blood pressure at baseline with hypertensive response to exercise.  __________  Cardiac MRI 05/06/2014: IMPRESSION:  1) Mild LVE with diffuse hypokinesis EF 43% no discrete RWMA 2) No delayed enhancement or evidence of infarct or infiltration 3) Normal RV size and function with normal RVOT  4) Mild MR  5) Mild AR  6) Mild TR  7) Normal LA/RA  8) No pericardial effusion  __________  2D echo 06/03/2012: - Left ventricle: The cavity size was normal. Wall thickness    was normal. Systolic function was moderately to severely    reduced. The estimated ejection fraction was 35%, in the    range of 30% to 35%. Diffuse hypokinesis. Left ventricular    diastolic function parameters were normal.  - Mitral valve: Mild regurgitation.  - Left atrium: The atrium was  mildly dilated.   EKG:  EKG is ordered today.  The EKG ordered today demonstrates ***  Recent Labs: No results found for requested labs within last 365 days.  Recent Lipid Panel    Component Value Date/Time   CHOL 253 (H) 10/25/2017 0814   TRIG 289 (H) 10/25/2017 0814   HDL 38 (L) 10/25/2017 0814   CHOLHDL 6.7 (H) 10/25/2017 0814   CHOLHDL 4 06/26/2016 1027  VLDL 73.4 (H) 06/14/2014 1146   LDLCALC 157 (H) 10/25/2017 0814   LDLDIRECT 102.0 06/26/2016 1027    PHYSICAL EXAM:    VS:  There were no vitals taken for this visit.  BMI: There is no height or weight on file to calculate BMI.  Physical Exam  Wt Readings from Last 3 Encounters:  12/18/21 250 lb (113.4 kg)  05/17/21 250 lb (113.4 kg)  01/30/21 255 lb (115.7 kg)     ASSESSMENT & PLAN:   with CAD involving the native coronary arteries: HFrEF secondary to NICM: Persistent Afib: HTN: Blood pressure *** HLD: LDL   {Are you ordering a CV Procedure (e.g. stress test, cath, DCCV, TEE, etc)?   Press F2        :[id number]}     Disposition: F/u with Dr. Caryl Comes or an APP in ***.   Medication Adjustments/Labs and Tests Ordered: Current medicines are reviewed at length with the patient today.  Concerns regarding medicines are outlined above. Medication changes, Labs and Tests ordered today are summarized above and listed in the Patient Instructions accessible in Encounters.   Signed, Christell Faith, PA-C 02/09/2022 11:13 AM     [address] [place],  [id number] [phone]

## 2022-02-09 NOTE — Telephone Encounter (Signed)
Called patient and he stated that he has been experiencing brief periods of chest pain at rest, and with activity when lifting weights for the last month. He stated that he has had a tragic death in the family recently and these events have increased in frequency.   Patient has a follow up scheduled with Dr. Caryl Comes for 02/23/22, however I was able to offer him an earlier appointment with Christell Faith on Monday 02/12/22. Patient was very grateful for the accomodation.  Discussed ED precautions with patient as well, he verbalized understanding and agreed with plan.

## 2022-02-12 ENCOUNTER — Ambulatory Visit: Payer: BC Managed Care – PPO | Admitting: Physician Assistant

## 2022-02-14 ENCOUNTER — Ambulatory Visit (INDEPENDENT_AMBULATORY_CARE_PROVIDER_SITE_OTHER): Payer: No Typology Code available for payment source | Admitting: Dermatology

## 2022-02-14 ENCOUNTER — Encounter: Payer: Self-pay | Admitting: Dermatology

## 2022-02-14 ENCOUNTER — Ambulatory Visit: Payer: BLUE CROSS/BLUE SHIELD | Attending: Surgical Oncology

## 2022-02-14 DIAGNOSIS — D492 Neoplasm of unspecified behavior of bone, soft tissue, and skin: Secondary | ICD-10-CM

## 2022-02-14 DIAGNOSIS — Z1283 Encounter for screening for malignant neoplasm of skin: Secondary | ICD-10-CM

## 2022-02-14 DIAGNOSIS — Z85828 Personal history of other malignant neoplasm of skin: Secondary | ICD-10-CM

## 2022-02-14 DIAGNOSIS — B078 Other viral warts: Secondary | ICD-10-CM

## 2022-02-14 DIAGNOSIS — L814 Other melanin hyperpigmentation: Secondary | ICD-10-CM | POA: Diagnosis not present

## 2022-02-14 DIAGNOSIS — C4441 Basal cell carcinoma of skin of scalp and neck: Secondary | ICD-10-CM

## 2022-02-14 DIAGNOSIS — L578 Other skin changes due to chronic exposure to nonionizing radiation: Secondary | ICD-10-CM | POA: Diagnosis not present

## 2022-02-14 DIAGNOSIS — L570 Actinic keratosis: Secondary | ICD-10-CM

## 2022-02-14 DIAGNOSIS — L821 Other seborrheic keratosis: Secondary | ICD-10-CM

## 2022-02-14 DIAGNOSIS — D229 Melanocytic nevi, unspecified: Secondary | ICD-10-CM

## 2022-02-14 DIAGNOSIS — C4491 Basal cell carcinoma of skin, unspecified: Secondary | ICD-10-CM

## 2022-02-14 DIAGNOSIS — D1800 Hemangioma unspecified site: Secondary | ICD-10-CM

## 2022-02-14 HISTORY — DX: Basal cell carcinoma of skin, unspecified: C44.91

## 2022-02-14 MED ORDER — FLUOROURACIL 5 % EX CREA: TOPICAL_CREAM | CUTANEOUS | 1 refills | Status: DC

## 2022-02-14 NOTE — Patient Instructions (Addendum)
Cryotherapy Aftercare  Wash gently with soap and water everyday.   Apply Vaseline daily until healed.   Continue Heliocare as directed.  Recommend Niacinamide or Nicotinamide '500mg'$  twice per day to lower risk of non-melanoma skin cancer by approximately 25%. This is usually available at [employer] [facility]   Start 5-fluorouracil/calcipotriene cream twice a day for 4 days to affected areas including face. Prescription sent to [facility]. Patient provided with contact information for pharmacy and advised the pharmacy will mail the prescription to their home. Patient provided with handout reviewing treatment course and side effects and advised to call or message [place] on MyChart with any concerns.  Can wait until fall to start this treatment.   5-Fluorouracil/Calcipotriene Patient Education   Actinic keratoses are the dry, red scaly spots on the skin caused by sun damage. A portion of these spots can turn into skin cancer with time, and treating them can help prevent development of skin cancer.   Treatment of these spots requires removal of the defective skin cells. There are various ways to remove actinic keratoses, including freezing with liquid nitrogen, treatment with creams, or treatment with a blue light procedure in the office.   5-fluorouracil cream is a topical cream used to treat actinic keratoses. It works by interfering with the growth of abnormal fast-growing skin cells, such as actinic keratoses. These cells peel off and are replaced by healthy ones.   5-fluorouracil/calcipotriene is a combination of the 5-fluorouracil cream with a vitamin D analog cream called calcipotriene. The calcipotriene alone does not treat actinic keratoses. However, when it is combined with 5-fluorouracil, it helps the 5-fluorouracil treat the actinic keratoses much faster so that the same results can be achieved with a much shorter treatment time.  INSTRUCTIONS FOR 5-FLUOROURACIL/CALCIPOTRIENE CREAM:    5-fluorouracil/calcipotriene cream typically only needs to be used for 4-7 days. A thin layer should be applied twice a day to the treatment areas recommended by your physician.   If your physician prescribed you separate tubes of 5-fluourouracil and calcipotriene, apply a thin layer of 5-fluorouracil followed by a thin layer of calcipotriene.   Avoid contact with your eyes, nostrils, and mouth. Do not use 5-fluorouracil/calcipotriene cream on infected or open wounds.   You will develop redness, irritation and some crusting at areas where you have pre-cancer damage/actinic keratoses. IF YOU DEVELOP PAIN, BLEEDING, OR SIGNIFICANT CRUSTING, STOP THE TREATMENT EARLY - you have already gotten a good response and the actinic keratoses should clear up well.  Wash your hands after applying 5-fluorouracil 5% cream on your skin.   A moisturizer or sunscreen with a minimum SPF 30 should be applied each morning.   Once you have finished the treatment, you can apply a thin layer of Vaseline twice a day to irritated areas to soothe and calm the areas more quickly. If you experience significant discomfort, contact your physician.  For some patients it is necessary to repeat the treatment for best results.  SIDE EFFECTS: When using 5-fluorouracil/calcipotriene cream, you may have mild irritation, such as redness, dryness, swelling, or a mild burning sensation. This usually resolves within 2 weeks. The more actinic keratoses you have, the more redness and inflammation you can expect during treatment. Eye irritation has been reported rarely. If this occurs, please let us know.  If you have any trouble using this cream, please call the office. If you have any other questions about this information, please do not hesitate to ask me before you leave the office.  Recommend daily  broad spectrum sunscreen SPF 30+ to sun-exposed areas, reapply every 2 hours as needed. Call for new or changing lesions.  Staying in  the shade or wearing long sleeves, sun glasses (UVA+UVB protection) and wide brim hats (4-inch brim around the entire circumference of the hat) are also recommended for sun protection.    Melanoma ABCDEs  Melanoma is the most dangerous type of skin cancer, and is the leading cause of death from skin disease.  You are more likely to develop melanoma if you: Have light-colored skin, light-colored eyes, or red or blond hair Spend a lot of time in the sun Tan regularly, either outdoors or in a tanning bed Have had blistering sunburns, especially during childhood Have a close family member who has had a melanoma Have atypical moles or large birthmarks  Early detection of melanoma is key since treatment is typically straightforward and cure rates are extremely high if we catch it early.   The first sign of melanoma is often a change in a mole or a new dark spot.  The ABCDE system is a way of remembering the signs of melanoma.  A for asymmetry:  The two halves do not match. B for border:  The edges of the growth are irregular. C for color:  A mixture of colors are present instead of an even brown color. D for diameter:  Melanomas are usually (but not always) greater than 75m - the size of a pencil eraser. E for evolution:  The spot keeps changing in size, shape, and color.  Please check your skin once per month between visits. You can use a small mirror in front and a large mirror behind you to keep an eye on the back side or your body.   If you see any new or changing lesions before your next follow-up, please call to schedule a visit.  Please continue daily skin protection including broad spectrum sunscreen SPF 30+ to sun-exposed areas, reapplying every 2 hours as needed when you're outdoors.   Staying in the shade or wearing long sleeves, sun glasses (UVA+UVB protection) and wide brim hats (4-inch brim around the entire circumference of the hat) are also recommended for sun protection.      Due to recent changes in healthcare laws, you may see results of your pathology and/or laboratory studies on MyChart before the doctors have had a chance to review them. We understand that in some cases there may be results that are confusing or concerning to you. Please understand that not all results are received at the same time and often the doctors may need to interpret multiple results in order to provide you with the best plan of care or course of treatment. Therefore, we ask that you please give u[place]2 business days to thoroughly review all your results before contacting the office for clarification. Should we see a critical lab result, you will be contacted sooner.   If You Need Anything After Your Visit  If you have any questions or concerns for your doctor, please call our main line at [phone] press option 4 to reach your doctor's medical assistant. If no one answers, please leave a voicemail as directed and we will return your call as soon as possible. Messages left after 4 pm will be answered the following business day.   You may also send [place] message via [place] We typically respond to MyChart messages within 1-2 business days.  For prescription refills, please ask your pharmacy to contact our  office. Our fax number is [phone].  If you have an urgent issue when the clinic is closed that cannot wait until the next business day, you can page your doctor at the number below.    Please note that while we do our best to be available for urgent issues outside of office hours, we are not available 24/7.   If you have an urgent issue and are unable to reach [place], you may choose to seek medical care at your doctor's office, retail clinic, urgent care center, or emergency room.  If you have a medical emergency, please immediately call 911 or go to the emergency department.  Pager Numbers  - Dr. Nehemiah Massed: [phone]  - Dr. Laurence Ferrari: [phone]  - Dr. Nicole Kindred:  [phone]  In the event of inclement weather, please call our main line at [phone] for an update on the status of any delays or closures.  Dermatology Medication Tips: Please keep the boxes that topical medications come in in order to help keep track of the instructions about where and how to use these. Pharmacies typically print the medication instructions only on the boxes and not directly on the medication tubes.   If your medication is too expensive, please contact our office at [phone] option 4 or send [place] a message through [place].   We are unable to tell what your co-pay for medications will be in advance as this is different depending on your insurance coverage. However, we may be able to find a substitute medication at lower cost or fill out paperwork to get insurance to cover a needed medication.   If a prior authorization is required to get your medication covered by your insurance company, please allow [place] 1-2 business days to complete this process.  Drug prices often vary depending on where the prescription is filled and some pharmacies may offer cheaper prices.  The website www.goodrx.com contains coupons for medications through different pharmacies. The prices here do not account for what the cost may be with help from insurance (it may be cheaper with your insurance), but the website can give you the price if you did not use any insurance.  - You can print the associated coupon and take it with your prescription to the pharmacy.  - You may also stop by our office during regular business hours and pick up a GoodRx coupon card.  - If you need your prescription sent electronically to a different pharmacy, notify our office through [facility] or by phone at [phone] option 4.     Si Usted Necesita Algo Despus de Su Visita  Tambin puede enviarnos un mensaje a travs de Pharmacist, community. Por lo general respondemos a los mensajes de MyChart en el transcurso de 1 a 2  das hbiles.  Para renovar recetas, por favor pida a su farmacia que se ponga en contacto con nuestra oficina. Harland Dingwall de fax es [place] [phone].  Si tiene un asunto urgente cuando la clnica est cerrada y que no puede esperar hasta el siguiente da hbil, puede llamar/localizar a su doctor(a) al nmero que aparece a continuacin.   Por favor, tenga en cuenta que aunque hacemos todo lo posible para estar disponibles para asuntos urgentes fuera del horario de [place], no estamos disponibles las 24 horas del da, los 7 das de la [place].   Si tiene un problema urgente y no puede comunicarse con nosotros, puede optar por buscar atencin mdica  en el consultorio de su doctor(a), en una clnica privada, en  un centro de atencin urgente o en una sala de emergencias.  Si tiene Engineering geologist, por favor llame inmediatamente al 911 o vaya a la sala de emergencias.  Nmeros de bper  - Dr. Nehemiah Massed: [phone]  - Dra. Moye: [phone]  - Dra. Nicole Kindred: [phone]  En caso de inclemencias del [place], por favor llame a Johnsie Kindred principal al [phone] para una actualizacin sobre el [place] de cualquier retraso o cierre.  Consejos para la medicacin en dermatologa: Por favor, guarde las cajas en las que vienen los medicamentos de uso tpico para ayudarle a seguir las instrucciones sobre dnde y cmo usarlos. Las farmacias generalmente imprimen las instrucciones del medicamento slo en las cajas y no directamente en los tubos del [place].   Si su medicamento es muy caro, por favor, pngase en contacto con Zigmund Daniel llamando al [phone] y presione la opcin 4 o envenos un mensaje a travs de Pharmacist, community.   No podemos decirle cul ser su copago por los medicamentos por adelantado ya que esto es diferente dependiendo de la cobertura de su seguro. Sin embargo, es posible que podamos encontrar un medicamento sustituto a Electrical engineer un formulario para que el  seguro cubra el medicamento que se considera necesario.   Si se requiere una autorizacin previa para que su compaa de seguros [place] su medicamento, por favor permtanos de 1 a 2 das hbiles para completar este proceso.  Los precios de los medicamentos varan con frecuencia dependiendo del Environmental consultant de dnde se surte la receta y alguna farmacias pueden ofrecer precios ms baratos.  El sitio web www.goodrx.com tiene cupones para medicamentos de Airline pilot. Los precios aqu no tienen en cuenta lo que podra costar con la ayuda del seguro (puede ser ms barato con su seguro), pero el sitio web puede darle el precio si no utiliz Research scientist (physical sciences).  - Puede imprimir el cupn correspondiente y llevarlo con su receta a la farmacia.  - Tambin puede pasar por nuestra oficina durante el horario de atencin regular y Charity fundraiser una tarjeta de cupones de GoodRx.  - Si necesita que su receta se enve electrnicamente a una farmacia diferente, informe a nuestra oficina a travs de MyChart de [place] o por telfono llamando al [phone] y presione la opcin 4.

## 2022-02-14 NOTE — Progress Notes (Signed)
Follow-Up Visit   Subjective  [name] is a 56 y.o. male who presents for the following: Annual Exam (Here for skin cancer screening. Full body. Hx of BCC's. Hx of AK's. Areas of concern today. Hx of PDT treatment in the past).  The patient presents for Total-Body Skin Exam (TBSE) for skin cancer screening and mole check.  The patient has spots, moles and lesions to be evaluated, some may be new or changing and the patient has concerns that these could be cancer.   The following portions of the chart were reviewed this encounter and updated as appropriate:  Tobacco  Allergies  Meds  Problems  Med Hx  Surg Hx  Fam Hx      Review of Systems: No other skin or systemic complaints except as noted in HPI or Assessment and Plan.   Objective  Well appearing patient in no apparent distress; mood and affect are within normal limits.  A full examination was performed including scalp, head, eyes, ears, nose, lips, neck, chest, axillae, abdomen, back, buttocks, bilateral upper extremities, bilateral lower extremities, hands, feet, fingers, toes, fingernails, and toenails. All findings within normal limits unless otherwise noted below.  Left Anterior Neck 0.9 cm scaly pink papule     Right Forearm, slightly hypertrophic x1 Erythematous thin papules/macules with gritty scale.   right medial knee x1 Verrucous papules -- Discussed viral etiology and contagion.    Assessment & Plan   History of Basal Cell Carcinoma of the Skin. Upper mid back, 2021. Right upper arm, 2022 - No evidence of recurrence today - Recommend regular full body skin exams - Recommend daily broad spectrum sunscreen SPF 30+ to sun-exposed areas, reapply every 2 hours as needed.  - Call if any new or changing lesions are noted between office visits  - Recommend Niacinamide or Nicotinamide '500mg'$  twice per day to lower risk of non-melanoma skin cancer by approximately 25%. This is usually available at  [facility].  Lentigines - Scattered tan macules - Due to sun exposure - Benign-appearing, observe - Recommend daily broad spectrum sunscreen SPF 30+ to sun-exposed areas, reapply every 2 hours as needed. - Call for any changes  Seborrheic Keratoses - Stuck-on, waxy, tan-brown papules and/or plaques  - Benign-appearing - Discussed benign etiology and prognosis. - Observe - Call for any changes  Melanocytic Nevi - Tan-brown and/or pink-flesh-colored symmetric macules and papules - Benign appearing on exam today - Observation - Call clinic for new or changing moles - Recommend daily use of broad spectrum spf 30+ sunscreen to sun-exposed areas.   Hemangiomas - Red papules - Discussed benign nature - Observe - Call for any changes  Actinic Damage - Severe, confluent actinic changes with pre-cancerous actinic keratoses  - Severe, chronic, not at goal, secondary to cumulative UV radiation exposure over time - diffuse scaly erythematous macules and papules with underlying dyspigmentation - Discussed Prescription "Field Treatment" for Severe, Chronic Confluent Actinic Changes with Pre-Cancerous Actinic Keratoses Field treatment involves treatment of an entire area of skin that has confluent Actinic Changes (Sun/ Ultraviolet light damage) and PreCancerous Actinic Keratoses by method of PhotoDynamic Therapy (PDT) and/or prescription Topical Chemotherapy agents such as 5-fluorouracil, 5-fluorouracil/calcipotriene, and/or imiquimod.  The purpose is to decrease the number of clinically evident and subclinical PreCancerous lesions to prevent progression to development of skin cancer by chemically destroying early precancer changes that may or may not be visible.  It has been shown to reduce the risk of developing skin cancer in the  treated area. As a result of treatment, redness, scaling, crusting, and open sores may occur during treatment course. One or more than one of these methods may be  used and may have to be used several times to control, suppress and eliminate the PreCancerous changes. Discussed treatment course, expected reaction, and possible side effects. - Recommend daily broad spectrum sunscreen SPF 30+ to sun-exposed areas, reapply every 2 hours as needed.  - Staying in the shade or wearing long sleeves, sun glasses (UVA+UVB protection) and wide brim hats (4-inch brim around the entire circumference of the hat) are also recommended. - Call for new or changing lesions.  - Continue Heliocare as directed.  - Start 5-fluorouracil/calcipotriene cream twice a day for 4 days to affected areas including face. Prescription sent to [facility]. Patient provided with contact information for pharmacy and advised the pharmacy will mail the prescription to their home. Patient provided with handout reviewing treatment course and side effects and advised to call or message [place] on MyChart with any concerns. Can wait until fall to start this treatment.  Skin cancer screening performed today.  Neoplasm of skin Left Anterior Neck  Epidermal / dermal shaving  Lesion diameter (cm):  0.9 Informed consent: discussed and consent obtained   Patient was prepped and draped in usual sterile fashion: Area prepped with alcohol. Anesthesia: the lesion was anesthetized in a standard fashion   Anesthetic:  1% lidocaine w/ epinephrine 1-100,000 buffered w/ 8.4% NaHCO3 Instrument used: flexible razor blade   Hemostasis achieved with: pressure, aluminum chloride and electrodesiccation   Outcome: patient tolerated procedure well   Post-procedure details: wound care instructions given   Post-procedure details comment:  Ointment and small bandage applied  Destruction of lesion  Destruction method: electrodesiccation and curettage   Informed consent: discussed and consent obtained   Timeout:  patient name, date of birth, surgical site, and procedure verified Anesthesia: the lesion was  anesthetized in a standard fashion   Anesthetic:  1% lidocaine w/ epinephrine 1-100,000 buffered w/ 8.4% NaHCO3 Curettage performed in three different directions: Yes   Electrodesiccation performed over the curetted area: Yes   Curettage cycles:  3 Lesion length (cm):  0.9 Lesion width (cm):  0.9 Margin per side (cm):  0.2 Final wound size (cm):  1.3 Hemostasis achieved with:  electrodesiccation Outcome: patient tolerated procedure well with no complications   Post-procedure details: sterile dressing applied and wound care instructions given   Dressing type: petrolatum    Specimen 1 - Surgical pathology Differential Diagnosis: R/O BCC  Check Margins: No  AK (actinic keratosis) Right Forearm, slightly hypertrophic x1  Actinic keratoses are precancerous spots that appear secondary to cumulative UV radiation exposure/sun exposure over time. They are chronic with expected duration over 1 year. A portion of actinic keratoses will progress to squamous cell carcinoma of the skin. It is not possible to reliably predict which spots will progress to skin cancer and so treatment is recommended to prevent development of skin cancer.  Recommend daily broad spectrum sunscreen SPF 30+ to sun-exposed areas, reapply every 2 hours as needed.  Recommend staying in the shade or wearing long sleeves, sun glasses (UVA+UVB protection) and wide brim hats (4-inch brim around the entire circumference of the hat). Call for new or changing lesions.  Destruction of lesion - Right Forearm, slightly hypertrophic x1  Destruction method: cryotherapy   Informed consent: discussed and consent obtained   Lesion destroyed using liquid nitrogen: Yes   Outcome: patient tolerated procedure well with no  complications   Post-procedure details: wound care instructions given   Additional details:  Prior to procedure, discussed risks of blister formation, small wound, skin dyspigmentation, or rare scar following cryotherapy.  Recommend Vaseline ointment to treated areas while healing.   fluorouracil (EFUDEX) 5 % cream - Right Forearm, slightly hypertrophic x1 Apply twice daily to face for 4 days  Other viral warts right medial knee x1  Discussed viral etiology and risk of spread.  Discussed multiple treatments may be required to clear warts.  Discussed possible post-treatment dyspigmentation and risk of recurrence.  Destruction of lesion - right medial knee x1  Destruction method: cryotherapy   Informed consent: discussed and consent obtained   Lesion destroyed using liquid nitrogen: Yes   Outcome: patient tolerated procedure well with no complications   Post-procedure details: wound care instructions given   Additional details:  Prior to procedure, discussed risks of blister formation, small wound, skin dyspigmentation, or rare scar following cryotherapy. Recommend Vaseline ointment to treated areas while healing.    Return in about 6 months (around 08/17/2022) for TBSE, HxBCC's.  I, Emelia Salisbury, CMA, am acting as scribe for Forest Gleason, MD.  Documentation: I have reviewed the above documentation for accuracy and completeness, and I agree with the above.  Forest Gleason, MD

## 2022-02-19 NOTE — Progress Notes (Unsigned)
Cardiology Office Note    Date:  02/20/2022   ID:  [name], DOB 1966-04-25, MRN [id number]  PCP:  Adin Hector, MD  Cardiologist:  Virl Axe, MD  Electrophysiologist:  Virl Axe, MD   Chief Complaint: Chest pain  History of Present Illness:   [name] is a 56 y.o. 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  He was diagnosed with new onset Afib in 2011 and underwent TEE-guided DCCV with TEE showing an EF of 30%. Repeat echo several months later showed an EF of 40-45%. Due to nonexertional chest discomfort, he underwent Lexiscan MPI in 2011 that showed an EF of 46% with some anterior wall redistribution. Subsequent cardiac cath in 03/2010 showed nonobstructive CAD. Subsequent echoes in 2012 and 2013 showed a persistent cardiomyopathy with an EF ranging between 30 and 35%. Cardiac MRI in 2015 showed an EF of 43% with no LGE. Echo in 2018 showed an EF of 40-45%. Coronary CTA in 10/2019 showed a calcium score of 137 with no evidence of obstructive CAD with a trileaflet aortic valve. CT FFR normal in all distributions. See details below. Echo in 08/2020 showed an EF of 30-35%. Given drop in EF, his [place] cardiologist recommended cardiac cath with notes indicate he underwent cardiac cath through the [facility] in 2022, which was reportedly nonobstructive. He has been followed by our EP service.   He contacted our office on 02/09/2022 noting intermittent chest pain with exertion such as lifting weights, and at rest that were progressive following a family death. Appointment was scheduled for today.   He comes in today noting intermittent left-sided chest pain described as sharp/dull  that is randomly occurring and will last for several minutes in duration followed by spontaneous resolution.  No radiation or associated symptoms.  Pain feels more intense and is longer lasting than what he has previously experienced.  No dizziness, presyncope, or syncope.  He has also noted 1 episode of self-limiting palpitations.  He does report the loss of his 49 year old son recently, of what appears to be a possible underlying cardiac condition.  At the time of his visit he was largely chest pain-free, though did have a brief self-limiting episode of left-sided chest discomfort that lasted for a couple of seconds.  He is adherent and tolerating cardiac medications.  No falls.  He does note a history of intermittent BRBPR, without melena.   Labs independently reviewed: 12/2021 - TSH normal 10/2020 - BUN 24, serum creatinine 1.02, potassium 4.5, Hgb 12.9, PLT 195 07/2020 - albumin 4.4, AST 73, ALT normal, TC 189, TG 223, HDL 43, LDL 101    Past Medical History:  Diagnosis Date   Anxiety    Arthritis    gout   Atrial fibrillation ([place]) 1989   Basal cell carcinoma 06/23/2020   Upper mid back. Nodular.   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   b complex vitamins capsule Take 1 capsule by mouth daily.   carvedilol (COREG) 25 MG tablet 12.5 mg 2 (two) times daily with a meal.    Coenzyme Q10 (CO Q-10) 100 MG CAPS Take 1 capsule by mouth daily.   cyclobenzaprine (FLEXERIL) 10 MG tablet Take by mouth daily.   Docusate Sodium 100 MG capsule Take 100 mg by mouth 2 (two) times daily.   empagliflozin (JARDIANCE) 25 MG TABS tablet Take 25 mg by mouth daily.   ENTRESTO 97-103 MG Take 1 tablet by mouth 2 (two) times daily.   fenofibrate (TRICOR) 48 MG tablet Take 1 tablet by mouth daily.   FLUOXETINE HCL PO Take 80 mg by mouth daily.   levothyroxine (SYNTHROID) 200 MCG tablet Take 200 mcg by mouth daily.   mirtazapine (REMERON) 15 MG tablet Take 7.5 mg by mouth daily.   Niacinamide 500 MG TBCR Take 1 tablet by mouth in the morning and at bedtime. For skin cancer prevention.   Omega-3 Fatty Acids (FISH OIL) 500 MG CAPS Take 1 capsule by mouth daily.   Polypodium Leucotomos (HELIOCARE) 240 MG CAPS Take 240 mg by mouth every evening.   rivaroxaban (XARELTO) 20 MG TABS tablet Take 1 tablet (20 mg total) by mouth daily.   spironolactone (ALDACTONE) 25 MG tablet Take 25 mg by mouth daily.   Turmeric 500 MG TABS Take by mouth daily.   vitamin C (ASCORBIC ACID) 500 MG tablet Take 500 mg by mouth daily.     Allergies:   Patient has no known allergies.   Social History   Socioeconomic History   Marital status: Married    Spouse name: Not on file   Number of children: 1   Years of education: Not on file   Highest education level: Not on file  Occupational History   Occupation: Sales  Tobacco Use   Smoking status: Never   Smokeless tobacco: Never  Vaping Use   Vaping Use: Never used  Substance and Sexual Activity   Alcohol use: Yes    Alcohol/week: 2.0 - 3.0 standard drinks of alcohol    Types: 2 - 3 Cans of beer per week    Comment: occassional   Drug use: No   Sexual  activity: Not on file  Other Topics Concern   Not on file  Social History Narrative   Married with 1 son   Social Determinants of Health   Financial Resource Strain: Not on file  Food Insecurity: Not on file  Transportation Needs: Not on file  Physical Activity: Not on file  Stress: Not on file  Social Connections: Not on file     Family History:  The patient's family history includes Arthritis in his mother; COPD in his mother; Coronary artery disease in his paternal grandfather; Crohn's disease in his brother; Depression in his mother; Diabetes in his mother; Heart  attack in his father; Lung cancer in his maternal grandfather, paternal uncle, and paternal uncle; Pancreatic cancer in his paternal uncle; Thyroid cancer in his paternal aunt. There is no history of Hypertension, Colon cancer, Colon polyps, Esophageal cancer, Rectal cancer, or Stomach cancer.  ROS:   12-point review of systems is negative unless otherwise noted in the HPI.   EKGs/Labs/Other Studies Reviewed:    Studies reviewed were summarized above. The additional studies were reviewed today:  2D echo 09/02/2020: 1. Left ventricular ejection fraction, by estimation, is 30 to 35%. The  left ventricle has moderately decreased function. The left ventricle  demonstrates global hypokinesis. Grossly, unable to exclude regional wall  motion abnormality. The left  ventricular internal cavity size was mildly to moderately dilated.   2. Right ventricular systolic function is normal. The right ventricular  size is normal. Tricuspid regurgitation signal is inadequate for assessing  PA pressure.   3. Left atrial size was moderately dilated.   4. The mitral valve is normal in structure. Mild mitral valve  regurgitation.   5. The inferior vena cava is dilated in size with >50% respiratory  variability, suggesting right atrial pressure of 8 mmHg.   6. Challenging images. Definity used. __________  Coronary CTA with CT FFR  10/15/2019: Aorta: Normal size. Ascending aorta 2.9 cm. No calcifications. No dissection.   Aortic Valve:  Trileaflet.  Minimal calcification.   Coronary Arteries:  Normal coronary origin.  Right dominance.   RCA is a small non-dominant artery.  There is no plaque.   Left main is a large artery that gives rise to LAD, RI, and LCX arteries.   LAD is a large vessel that has mild (25-49%) soft plaque proximally. Scattered minimal (<25%) calcified plaque.   LCX is a large dominant artery that gives rise to large OM1, OM2 and OM3 branches. There is minimal (<25%) calcified plaque distal to OM3. The LCX gives rise to the PDA and PLVB.   RI is a branching vessel with moderate (50-69%) calcified plaque at the ostium and mild (25-49%) calcified in the mid vessel.   Other findings:   Normal pulmonary vein drainage into the left atrium.   Normal let atrial appendage without a thrombus.   Normal size of the pulmonary artery.   IMPRESSION: 1. Coronary calcium score of 137. This was 87th percentile for age and sex matched control. 2. Normal coronary origin with left dominance. 3. Moderate (50-69%) calcified plaque in the ostial RI. CAD-RADS 3. 4.  Mild plaque in the LAD and minimal plaque in the LCX. 5.  Will send study for FFRct. 6.  Aortic valve appears to be trileaflet.  CT FFR -  1. Left Main: findings FFRct 0.99 (normal) 2. LAD: findings FFRct 0.98 proximal, 0.92 mid, 0.86 distal (normal) 3. LCX: findings FFRct 0.97 proximal, 0.96 mid, 0.92 distal (normal) 4. Ramus: findings FFRct 0.99 proximal, 0.95 mid (normal) 5. RCA: findings FFRct 0.99 proximal, 0.92 mid (normal)   IMPRESSION: 1.  FFRct normal in all coronary distributions. 2.  Non-obstructive coronary disease. 3.  Recommend aggressive risk factor modification. __________  Calcium score 11/18/2017: IMPRESSION: Coronary calcium score of 62. This was 1 percentile for age and sex matched control. __________  2D echo  10/25/2017: - Left ventricle: The cavity size was moderately dilated. Systolic    function was moderately reduced. The estimated ejection fraction    was in the range of 35% to 40%. Diffuse hypokinesis. Regional    wall motion abnormalities cannot be excluded.  Doppler parameters    are consistent with abnormal left ventricular relaxation (grade 1    diastolic dysfunction).  - Left atrium: The atrium was mildly dilated.  - Right ventricle: Systolic function was normal.  - Pulmonary arteries: Systolic pressure was within the normal    range.   Impressions:   - Rhythm is normal sinus. __________  2D echo 09/24/2016: - Left ventricle: The cavity size was mildly dilated. Systolic    function was mildly to moderately reduced. The estimated ejection    fraction was in the range of 40% to 45%. Mild diffuse    hypokinesis. Regional wall motion abnormalities cannot be    excluded. Left ventricular diastolic function parameters were    normal.  - Mitral valve: There was mild regurgitation.  - Left atrium: The atrium was mildly dilated.  - Right ventricle: Systolic function was normal.  - Pulmonary arteries: Systolic pressure was mildly elevated. PA    peak pressure: 36 mm Hg (S).   Impressions:   - Challenging image quality. Rhythm is normal sinus. __________  ETT 01/07/2015: There was no ST segment deviation noted during stress. Blood pressure demonstrated a hypertensive response to exercise.   Normal treadmill stress test with  excellent exercise capacity. Elevated blood pressure at baseline with hypertensive response to exercise.  __________  Cardiac MRI 05/06/2014: IMPRESSION:  1) Mild LVE with diffuse hypokinesis EF 43% no discrete RWMA 2) No delayed enhancement or evidence of infarct or infiltration 3) Normal RV size and function with normal RVOT  4) Mild MR  5) Mild AR  6) Mild TR  7) Normal LA/RA  8) No pericardial effusion  __________  2D echo 06/03/2012: - Left  ventricle: The cavity size was normal. Wall thickness    was normal. Systolic function was moderately to severely    reduced. The estimated ejection fraction was 35%, in the    range of 30% to 35%. Diffuse hypokinesis. Left ventricular    diastolic function parameters were normal.  - Mitral valve: Mild regurgitation.  - Left atrium: The atrium was mildly dilated.   EKG:  EKG is ordered today.  The EKG ordered today demonstrates NSR, 69 bpm, poor R wave progression along the precordial leads, nonspecific ST-T changes, when compared to prior tracing lateral ST-T changes are improved and no further PVCs are noted  Recent Labs: No results found for requested labs within last 365 days.  Recent Lipid Panel    Component Value Date/Time   CHOL 253 (H) 10/25/2017 0814   TRIG 289 (H) 10/25/2017 0814   HDL 38 (L) 10/25/2017 0814   CHOLHDL 6.7 (H) 10/25/2017 0814   CHOLHDL 4 06/26/2016 1027   VLDL 73.4 (H) 06/14/2014 1146   LDLCALC 157 (H) 10/25/2017 0814   LDLDIRECT 102.0 06/26/2016 1027    PHYSICAL EXAM:    VS:  BP 110/68 (BP Location: Left Arm, Patient Position: Sitting, Cuff Size: Large)   Pulse 69   Ht '5\' 11"'$  (1.803 m)   Wt 249 lb 9.6 oz (113.2 kg)   SpO2 96%   BMI 34.81 kg/m   BMI: Body mass index is 34.81 kg/m.  Physical Exam Vitals reviewed.  Constitutional:      Appearance: He is well-developed.  HENT:     Head: Normocephalic and atraumatic.  Eyes:     General:        Right eye: No discharge.        Left eye: No discharge.  Cardiovascular:  Rate and Rhythm: Normal rate and regular rhythm.     Pulses:          Posterior tibial pulses are 2+ on the right side and 2+ on the left side.     Heart sounds: Normal heart sounds, S1 normal and S2 normal. Heart sounds not distant. No midsystolic click and no opening snap. No murmur heard.    No friction rub.  Pulmonary:     Effort: Pulmonary effort is normal. No respiratory distress.     Breath sounds: Normal breath  sounds. No decreased breath sounds, wheezing or rales.  Chest:     Chest wall: No tenderness.  Abdominal:     General: There is no distension.  Musculoskeletal:     Cervical back: Normal range of motion.     Right lower leg: No edema.     Left lower leg: No edema.  Skin:    General: Skin is warm and dry.     Nails: There is no clubbing.  Neurological:     Mental Status: He is alert and oriented to person, place, and time.  Psychiatric:        Speech: Speech normal.        Behavior: Behavior normal.        Thought Content: Thought content normal.        Judgment: Judgment normal.     Wt Readings from Last 3 Encounters:  02/20/22 249 lb 9.6 oz (113.2 kg)  12/18/21 250 lb (113.4 kg)  05/17/21 250 lb (113.4 kg)     ASSESSMENT & PLAN:   Other forms of angina with CAD involving the native coronary arteries: Currently chest pain-free in the office outside of a randomly occurring several second lasting episode of left-sided chest discomfort that was self-limiting.  We discussed invasive and noninvasive ischemic options, including coronary CTA versus cardiac cath, both from an ischemic evaluation perspective and from an ischemic/cardiomyopathy evaluation perspective.  Risks and benefits were discussed in detail.  He would like to discuss this further with his primary cardiologist at his visit later this week.  Continue aggressive risk factor modification and current medical therapy.  Await discussion with his primary cardiologist.  HFrEF secondary to NICM: He appears euvolemic and well compensated.  Despite maximally tolerated GDMT, his cardiomyopathy has persisted.  Given persistent cardiomyopathy, and symptoms of angina, he would benefit from ischemic evaluation as outlined above with plans for him to discuss this further with his primary cardiologist.  He would also benefit from updating an echo pending ischemic evaluation results with consideration for ICD thereafter for primary prevention  if his cardiomyopathy persists with an EF of less than 35%.  With reported history of his son recently passing away from what sounds like a cardiac condition, there may be some benefit for genetic testing as well.  These topics can be reviewed with his primary cardiologist at their visit later this week.  He will continue current dose carvedilol, Jardiance, Entresto, and spironolactone.  Persistent Afib: Maintaining sinus rhythm.  He remains on carvedilol as outlined above.  CHA2DS2-VASc at least 3.  He remains on rivaroxaban 20 mg and does not meet reduced dosing criteria.  He does note intermittent BRBPR with a history of prior prostate cancer.  This should be followed up on at his next visit.  HTN: Blood pressure is well controlled in the office today.  Continue current medications as outlined above.  HLD: LDL 101 with noted intolerance to atorvastatin.  Pending the above  work-up, may benefit from trial of rosuvastatin versus referral to the lipid clinic for PCSK9 inhibitor.   Disposition: F/u with Dr. Caryl Comes as scheduled later this week.   Medication Adjustments/Labs and Tests Ordered: Current medicines are reviewed at length with the patient today.  Concerns regarding medicines are outlined above. Medication changes, Labs and Tests ordered today are summarized above and listed in the Patient Instructions accessible in Encounters.   Signed, Christell Faith, PA-C 02/20/2022 5:16 PM     [address] [place], [place] [address] [phone]

## 2022-02-20 ENCOUNTER — Ambulatory Visit: Payer: BC Managed Care – PPO | Admitting: Physician Assistant

## 2022-02-20 ENCOUNTER — Encounter: Payer: Self-pay | Admitting: Physician Assistant

## 2022-02-20 ENCOUNTER — Telehealth: Payer: Self-pay

## 2022-02-20 VITALS — BP 110/68 | HR 69 | Ht 71.0 in | Wt 249.6 lb

## 2022-02-20 DIAGNOSIS — I5022 Chronic systolic (congestive) heart failure: Secondary | ICD-10-CM

## 2022-02-20 DIAGNOSIS — I25118 Atherosclerotic heart disease of native coronary artery with other forms of angina pectoris: Secondary | ICD-10-CM

## 2022-02-20 DIAGNOSIS — I428 Other cardiomyopathies: Secondary | ICD-10-CM | POA: Diagnosis not present

## 2022-02-20 DIAGNOSIS — I10 Essential (primary) hypertension: Secondary | ICD-10-CM | POA: Diagnosis not present

## 2022-02-20 DIAGNOSIS — I4819 Other persistent atrial fibrillation: Secondary | ICD-10-CM

## 2022-02-20 DIAGNOSIS — E782 Mixed hyperlipidemia: Secondary | ICD-10-CM

## 2022-02-20 NOTE — Patient Instructions (Signed)
Medication Instructions:  - Your physician recommends that you continue on your current medications as directed. Please refer to the Current Medication list given to you today.  *If you need a refill on your cardiac medications before your next appointment, please call your pharmacy*   Lab Work: - none ordered  If you have labs (blood work) drawn today and your tests are completely normal, you will receive your results only by: [place] (if you have MyChart) OR A paper copy in the mail If you have any lab test that is abnormal or we need to change your treatment, we will call you to review the results.   Testing/Procedures: - none ordered   Follow-Up: At [facility], you and your health needs are our priority.  As part of our continuing mission to provide you with exceptional heart care, we have created designated Provider Care Teams.  These Care Teams include your primary Cardiologist (physician) and Advanced Practice Providers (APPs -  Physician Assistants and Nurse Practitioners) who all work together to provide you with the care you need, when you need it.  We recommend signing up for the patient portal called "MyChart".  Sign up information is provided on this After Visit Summary.  MyChart is used to connect with patients for Virtual Visits (Telemedicine).  Patients are able to view lab/test results, encounter notes, upcoming appointments, etc.  Non-urgent messages can be sent to your provider as well.   To learn more about what you can do with MyChart, go to [email or web address].    Your next appointment:   As scheduled with Dr. Caryl Comes   The format for your next appointment:   In Person  Provider:   Virl Axe, MD 1}    Other Instructions N/a  Important Information About Sugar

## 2022-02-20 NOTE — Telephone Encounter (Addendum)
Called and discussed results with patient and area had already been treated. Patient verbalized and understanding and denied further questions at this time.  ----- Message from Alfonso Patten, MD sent at 02/20/2022 10:30 AM EDT ----- Skin , left anterior neck BASAL CELL CARCINOMA, NODULAR PATTERN, PERIPHERAL AND DEEP MARGINS INVOLVED --> already treated with ED&C. Recheck site at next visit.  MAs please call. Thank you!

## 2022-02-23 ENCOUNTER — Ambulatory Visit: Payer: BC Managed Care – PPO | Admitting: Internal Medicine

## 2022-02-23 ENCOUNTER — Encounter: Payer: Self-pay | Admitting: Internal Medicine

## 2022-02-23 VITALS — BP 110/70 | HR 72 | Ht 71.0 in | Wt 248.0 lb

## 2022-02-23 DIAGNOSIS — I5022 Chronic systolic (congestive) heart failure: Secondary | ICD-10-CM | POA: Diagnosis not present

## 2022-02-23 DIAGNOSIS — I25118 Atherosclerotic heart disease of native coronary artery with other forms of angina pectoris: Secondary | ICD-10-CM

## 2022-02-23 DIAGNOSIS — I428 Other cardiomyopathies: Secondary | ICD-10-CM

## 2022-02-23 DIAGNOSIS — I10 Essential (primary) hypertension: Secondary | ICD-10-CM

## 2022-02-23 DIAGNOSIS — I493 Ventricular premature depolarization: Secondary | ICD-10-CM

## 2022-02-23 DIAGNOSIS — I4819 Other persistent atrial fibrillation: Secondary | ICD-10-CM | POA: Diagnosis not present

## 2022-02-23 NOTE — Progress Notes (Signed)
Patient Care Team: Adin Hector, MD as PCP - General (Internal Medicine) Deboraha Sprang, MD as PCP - Electrophysiology (Cardiology) Deboraha Sprang, MD as PCP - Cardiology (Cardiology)   HPI  [name] is a 56 y.o. male Seen in followup for atrial fibrillation that was newly identified 2012.  He was found to have a cardiomyopathy.  Evaluation was also concerning for a bicuspid aortic valve>>  this was subsequently clarified as being trileaflet  Hx  squamous cell cancer associated with lymphadenopathy.  He underwent radiation and chemotherapy which is left him with some less energy.  Last evaluation there is no visual tumor.  .Interval diagnosis of prostate CA , eval at [facility] and [facility] and the decision has been to monitor with PSA  Coronary calcificationwith elevated CaScore on atorva 80    DATE TEST EF   2012    Echo   30 %   ? bicuspid  10/13    Echo  30-35 %   trileaflet  10/15    MRI  43 %   No LGE  2/18 Echo    40-45%   3/21 CTA  Ca Score 137 Non obstruc CAD AoV trileaflet  1/22 Echo  30-35%   4/22 LHC  No obstructive disease  6/22 [facility] ([facility]) 40-45%    Interval worsening at last assessment and Guideline directed medical therapy added   He is seen today with a history of chest pain over the last 6 or 8 weeks.  Waxes and wanes but has been present without interruption for about 6 weeks.  Most notable when he is lying down and things are quiet.  Perhaps worse over recent weeks.  He then tells me the story of the death of his son.  53 year old only child ex-preemie.  Had tachycardia a week or 2 before went into the hospital and early June presented to the hospital with nausea vomiting and a heart rate of 200.  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 Nodular.   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     b complex vitamins capsule Take 1 capsule by mouth daily.     carvedilol (COREG) 25 MG tablet 12.5 mg 2 (two) times daily with a meal.      Coenzyme Q10 (CO Q-10) 100 MG CAPS Take 1 capsule by mouth daily.     cyclobenzaprine (FLEXERIL) 10 MG tablet Take by mouth daily.     Docusate Sodium 100 MG capsule Take 100 mg by mouth daily.     empagliflozin (JARDIANCE) 25 MG TABS tablet Take 25 mg by mouth daily.     ENTRESTO 97-103 MG Take 1 tablet by mouth 2 (two) times daily.     fenofibrate (TRICOR) 48 MG tablet Take 1 tablet by mouth daily.     fluorouracil (EFUDEX) 5 % cream Apply twice daily to face for 4 days (Patient taking differently: as needed. Apply twice daily  for skin cancer) 15 g 1   FLUOXETINE HCL PO Take 80 mg by mouth daily.     Ibuprofen 200 MG CAPS Take 800 mg by mouth 2 (two) times daily as needed (pain).     levothyroxine (SYNTHROID) 200 MCG tablet Take 200 mcg by mouth daily.     meclizine (ANTIVERT) 25 MG tablet Take 1 tablet (25 mg total) by mouth 3 (three) times daily as needed for dizziness. 30 tablet 0   Melatonin 5 MG CAPS Take 10 mg by mouth at bedtime. Takes 2 capsules po at bedtime for sleep.     mirtazapine (REMERON) 15 MG tablet Take 7.5 mg by mouth daily.     Multiple Vitamin (MULTIVITAMIN) tablet Take 1 tablet by mouth daily.     mupirocin ointment (BACTROBAN) 2 % Apply 1 application topically daily. (Patient taking differently: Apply 1 application  topically as needed (skin irritation).) 30 g 0   Niacinamide 500 MG TBCR Take 1 tablet by mouth in the morning and at bedtime. For skin cancer prevention. 60 tablet 11   Omega-3 Fatty Acids (FISH OIL) 500 MG CAPS Take 1 capsule by mouth daily.     Polypodium Leucotomos (HELIOCARE) 240 MG CAPS Take 240 mg by mouth every evening.     rivaroxaban (XARELTO) 20 MG TABS tablet Take 1 tablet (20 mg total) by mouth daily. 30 tablet 5   spironolactone (ALDACTONE) 25 MG tablet Take 25 mg by mouth daily.     THERATEARS 0.25 % SOLN Place 1 drop into both eyes as needed (dry eyes).     Turmeric 500 MG TABS Take by mouth daily.     vitamin C (ASCORBIC ACID) 500 MG tablet Take 500 mg by mouth daily.      No current facility-administered medications for this visit.    No Known Allergies  Review of Systems negative except from HPI and PMH  Physical Exam BP 110/70   Pulse 72   Ht '5\' 11"'$  (1.803 m)   Wt 248 lb (112.5 kg)   SpO2 97%   BMI 34.59 kg/m  Well developed and nourished in no acute distress HENT normal Neck supple with JVP-  flat   Clear Chest pain is reproducible with hard rubbing to the left of his sternum, not present on the right  side aggravated by pinching of the soft  tissues Regular rate and rhythm, no murmurs or gallops Abd-soft with active BS No Clubbing cyanosis edema Skin-warm and dry A & Oriented  Grossly normal sensory and motor function  ECG       Assessment and  Plan  PVCs  Atrial Fibrillation   Cardiomyopathy--nonischemic  Hyperlipidemia  Cancers interval (As above)   Chest pain  Grief  Pain I think is chest wall soft tissue pain he is currently taking NSAIDs for the last couple of days and has suggested that he continue doing that.  I am not sure what mastitis looks like in men but there is no overlying erythema.  His cardiac evaluation over the last year and a half has been reassuring with a negative catheterization 4/22 and improved left ventricular ejection fraction 6/22.  We will continue on guideline directed therapy.  With the death of his son, his father and uncle and multiple other males in the family have died in their 63s suddenly.  No known coronary disease.  We will refer for genetic counseling, as there are multiple next generations on his father side   I am not sure what the right timeframe for this is so we will regroup in 6-8 weeks and make a decision

## 2022-02-23 NOTE — Patient Instructions (Signed)
Medication Instructions:  - Your physician recommends that you continue on your current medications as directed. Please refer to the Current Medication list given to you today.  *If you need a refill on your cardiac medications before your next appointment, please call your pharmacy*   Lab Work: - none ordered  If you have labs (blood work) drawn today and your tests are completely normal, you will receive your results only by: [place] (if you have MyChart) OR A paper copy in the mail If you have any lab test that is abnormal or we need to change your treatment, we will call you to review the results.   Testing/Procedures: - none ordered   Follow-Up: At [facility], you and your health needs are our priority.  As part of our continuing mission to provide you with exceptional heart care, we have created designated Provider Care Teams.  These Care Teams include your primary Cardiologist (physician) and Advanced Practice Providers (APPs -  Physician Assistants and Nurse Practitioners) who all work together to provide you with the care you need, when you need it.  We recommend signing up for the patient portal called "MyChart".  Sign up information is provided on this After Visit Summary.  MyChart is used to connect with patients for Virtual Visits (Telemedicine).  Patients are able to view lab/test results, encounter notes, upcoming appointments, etc.  Non-urgent messages can be sent to your provider as well.   To learn more about what you can do with MyChart, go to [email or web address].    Your next appointment:   2-3 month(s)  The format for your next appointment:   In Person  Provider:   Virl Axe, MD    Other Instructions N/a  Important Information About Sugar

## 2022-02-25 ENCOUNTER — Encounter: Payer: Self-pay | Admitting: Dermatology

## 2022-04-13 ENCOUNTER — Encounter: Payer: Self-pay | Admitting: *Deleted

## 2022-04-13 DIAGNOSIS — Z006 Encounter for examination for normal comparison and control in clinical research program: Secondary | ICD-10-CM

## 2022-04-13 NOTE — Research (Signed)
Message left for [name] about Designer, multimedia. Encouraged him to call back.

## 2022-05-08 ENCOUNTER — Ambulatory Visit: Payer: BC Managed Care – PPO | Attending: Internal Medicine | Admitting: Internal Medicine

## 2022-05-08 ENCOUNTER — Encounter: Payer: Self-pay | Admitting: Internal Medicine

## 2022-05-08 VITALS — BP 120/78 | HR 66 | Ht 71.0 in | Wt 257.8 lb

## 2022-05-08 DIAGNOSIS — K0262 Dental caries on smooth surface penetrating into dentin: Secondary | ICD-10-CM | POA: Insufficient documentation

## 2022-05-08 DIAGNOSIS — R002 Palpitations: Secondary | ICD-10-CM | POA: Insufficient documentation

## 2022-05-08 DIAGNOSIS — C329 Malignant neoplasm of larynx, unspecified: Secondary | ICD-10-CM | POA: Insufficient documentation

## 2022-05-08 DIAGNOSIS — G5603 Carpal tunnel syndrome, bilateral upper limbs: Secondary | ICD-10-CM | POA: Insufficient documentation

## 2022-05-08 DIAGNOSIS — I428 Other cardiomyopathies: Secondary | ICD-10-CM

## 2022-05-08 DIAGNOSIS — F4310 Post-traumatic stress disorder, unspecified: Secondary | ICD-10-CM | POA: Insufficient documentation

## 2022-05-08 DIAGNOSIS — K08539 Fractured dental restorative material, unspecified: Secondary | ICD-10-CM | POA: Insufficient documentation

## 2022-05-08 DIAGNOSIS — I4819 Other persistent atrial fibrillation: Secondary | ICD-10-CM

## 2022-05-08 DIAGNOSIS — M542 Cervicalgia: Secondary | ICD-10-CM | POA: Insufficient documentation

## 2022-05-08 DIAGNOSIS — Z77118 Contact with and (suspected) exposure to other environmental pollution: Secondary | ICD-10-CM | POA: Insufficient documentation

## 2022-05-08 DIAGNOSIS — H2513 Age-related nuclear cataract, bilateral: Secondary | ICD-10-CM | POA: Insufficient documentation

## 2022-05-08 DIAGNOSIS — I493 Ventricular premature depolarization: Secondary | ICD-10-CM | POA: Diagnosis not present

## 2022-05-08 DIAGNOSIS — K036 Deposits [accretions] on teeth: Secondary | ICD-10-CM | POA: Insufficient documentation

## 2022-05-08 DIAGNOSIS — I10 Essential (primary) hypertension: Secondary | ICD-10-CM

## 2022-05-08 DIAGNOSIS — I5022 Chronic systolic (congestive) heart failure: Secondary | ICD-10-CM | POA: Diagnosis not present

## 2022-05-08 DIAGNOSIS — E782 Mixed hyperlipidemia: Secondary | ICD-10-CM

## 2022-05-08 DIAGNOSIS — I5042 Chronic combined systolic (congestive) and diastolic (congestive) heart failure: Secondary | ICD-10-CM | POA: Insufficient documentation

## 2022-05-08 DIAGNOSIS — C61 Malignant neoplasm of prostate: Secondary | ICD-10-CM | POA: Insufficient documentation

## 2022-05-08 DIAGNOSIS — I517 Cardiomegaly: Secondary | ICD-10-CM | POA: Insufficient documentation

## 2022-05-08 DIAGNOSIS — G4763 Sleep related bruxism: Secondary | ICD-10-CM | POA: Insufficient documentation

## 2022-05-08 DIAGNOSIS — K089 Disorder of teeth and supporting structures, unspecified: Secondary | ICD-10-CM | POA: Insufficient documentation

## 2022-05-08 DIAGNOSIS — Z461 Encounter for fitting and adjustment of hearing aid: Secondary | ICD-10-CM | POA: Insufficient documentation

## 2022-05-08 DIAGNOSIS — H524 Presbyopia: Secondary | ICD-10-CM | POA: Insufficient documentation

## 2022-05-08 DIAGNOSIS — H903 Sensorineural hearing loss, bilateral: Secondary | ICD-10-CM | POA: Insufficient documentation

## 2022-05-08 MED ORDER — ROSUVASTATIN CALCIUM 10 MG PO TABS: 10.0000 mg | ORAL_TABLET | Freq: Every day | ORAL | 1 refills | Status: DC

## 2022-05-08 NOTE — Progress Notes (Signed)
Patient Care Team: [name] Hector, MD as PCP - General (Internal Medicine) [name] Sprang, MD as PCP - Electrophysiology (Cardiology) [name] Sprang, MD as PCP - Cardiology (Cardiology)   HPI  [name] is a 56 y.o. male Seen in followup for atrial fibrillation that was newly identified 2012.  He was found to have a cardiomyopathy.  Evaluation was also concerning for a bicuspid aortic valve>>  this was subsequently clarified as being trileaflet  On Guideline directed medical therapy   Hx  squamous cell cancer associated with lymphadenopathy.  He underwent radiation and chemotherapy which is left him with some less energy.  Last evaluation there is no visual tumor.  .Interval diagnosis of prostate CA , eval at [facility] and [facility] and the decision has been to monitor with PSA  Coronary calcificationwith elevated CaScore on atorva 80    DATE TEST EF   2012    Echo   30 %   ? bicuspid  10/13    Echo  30-35 %   trileaflet  10/15    MRI  43 %   No LGE  2/18 Echo    40-45%   3/21 CTA  Ca Score 137 Non obstruc CAD AoV trileaflet  1/22 Echo  30-35%   4/22 LHC  No obstructive disease  6/22 [facility] 40-45%   7/23 LHC ([facility])  No obstructive CAD   The patient denies chest pain, shortness of breath, nocturnal dyspnea, orthopnea or peripheral edema.  There have been no palpitations, lightheadedness or syncope.     He then tells me the story of the death of his son. [name]  75 year old only child ex-preemie.  Had tachycardia a week or 2 before went into the hospital and early June presented to the hospital with nausea vomiting and a heart rate of 200.  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 Nodular.   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     atorvastatin (LIPITOR) 20 MG tablet Take 20 mg by mouth daily.     b complex vitamins capsule Take 1 capsule by mouth daily.     carvedilol (COREG) 25 MG tablet 12.5 mg 2 (two) times daily with a meal.      Coenzyme Q10 (CO Q-10) 100 MG CAPS Take 1 capsule by mouth daily.     cyclobenzaprine (FLEXERIL) 10 MG tablet Take by mouth daily.     Docusate Sodium 100 MG capsule Take 100 mg by mouth daily.     empagliflozin (JARDIANCE) 25 MG TABS tablet Take 25 mg by mouth daily.     ENTRESTO 97-103 MG Take 1 tablet by mouth 2 (two) times daily.     fenofibrate (TRICOR) 48 MG tablet Take 1 tablet by mouth daily.     fluorouracil (EFUDEX) 5 %  cream Apply twice daily to face for 4 days 15 g 1   FLUOXETINE HCL PO Take 80 mg by mouth daily.     Ibuprofen 200 MG CAPS Take 800 mg by mouth 2 (two) times daily as needed (pain).     levothyroxine (SYNTHROID) 200 MCG tablet Take 200 mcg by mouth daily.     meclizine (ANTIVERT) 25 MG tablet Take 1 tablet (25 mg total) by mouth 3 (three) times daily as needed for dizziness. 30 tablet 0   Melatonin 5 MG CAPS Take 10 mg by mouth at bedtime. Takes 2 capsules po at bedtime for sleep.     mirtazapine (REMERON) 15 MG tablet Take 7.5 mg by mouth daily.     Multiple Vitamin (MULTIVITAMIN) tablet Take 1 tablet by mouth daily.     mupirocin ointment (BACTROBAN) 2 % Apply 1 application topically daily. 30 g 0   Niacinamide 500 MG TBCR Take 1 tablet by mouth in the morning and at bedtime. For skin cancer prevention. 60 tablet 11   Omega-3 Fatty Acids (FISH OIL) 500 MG CAPS Take 1 capsule by mouth daily.     Polypodium Leucotomos (HELIOCARE) 240 MG CAPS Take 240 mg by mouth every evening.     rivaroxaban (XARELTO) 20 MG TABS tablet Take 1 tablet (20 mg total) by mouth daily. 30 tablet 5   spironolactone (ALDACTONE) 25 MG tablet Take 25 mg by mouth daily.     THERATEARS 0.25 % SOLN Place 1 drop into both eyes as needed (dry eyes).     Turmeric 500 MG TABS Take by mouth daily.     vitamin C (ASCORBIC ACID) 500 MG tablet Take 500 mg by mouth daily.      No current facility-administered medications for this visit.    No Known Allergies  Review of Systems negative except from HPI and PMH  Physical Exam BP 120/78   Pulse 66   Ht '5\' 11"'$  (1.803 m)   Wt 257 lb 12.8 oz (116.9 kg)   SpO2 94%   BMI 35.96 kg/m  Well developed and well nourished in no acute distress HENT normal Neck supple with JVP-flat Clear Device pocket well healed; without hematoma or erythema.  There is no tethering  Regular rate and rhythm, no murmur Abd-soft with active  BS No Clubbing cyanosis  edema Skin-warm and dry A &  Oriented  Grossly normal sensory and motor function  ECG ssinus @ 66 18/07/37     Assessment and  Plan  PVCs  Atrial Fibrillation   Cardiomyopathy--nonischemic  Hyperlipidemia  Cancers interval (As above)   Chest pain  Grief  Chest pain has abated.  Catheterization was done at the [facility] which showed nonobstructive disease  No interval atrial fibrillation of which she is aware no bleeding continue Xarelto  With his cardiomyopathy, we will continue his guideline directed therapy Entresto, carvedilol, empagliflozin and spironolactone.  With his dyslipidemia, we will reach out to Dr. Debara Pickett for evaluation as to whether genetic testing is appropriate , I think his Pain is chest wall,soft tissue pain he is currently taking NSAIDs for the last couple of days and has suggested that he continue doing that.  I am not sure what mastitis looks like in men but there is no overlying erythema.

## 2022-05-08 NOTE — Patient Instructions (Signed)
Medication Instructions:  - Your physician has recommended you make the following change in your medication:   1) STOP Lipitor (Atorvastatin)  2) START Crestor (Rosuvastatin)   *If you need a refill on your cardiac medications before your next appointment, please call your pharmacy*   Lab Work: - none ordered  If you have labs (blood work) drawn today and your tests are completely normal, you will receive your results only by: [place] (if you have MyChart) OR A paper copy in the mail If you have any lab test that is abnormal or we need to change your treatment, we will call you to review the results.   Testing/Procedures: - You have been referred to: Dr. Debara Pickett in our [facility] office for Lipid Management    Follow-Up: At [facility], you and your health needs are our priority.  As part of our continuing mission to provide you with exceptional heart care, we have created designated Provider Care Teams.  These Care Teams include your primary Cardiologist (physician) and Advanced Practice Providers (APPs -  Physician Assistants and Nurse Practitioners) who all work together to provide you with the care you need, when you need it.  We recommend signing up for the patient portal called "MyChart".  Sign up information is provided on this After Visit Summary.  MyChart is used to connect with patients for Virtual Visits (Telemedicine).  Patients are able to view lab/test results, encounter notes, upcoming appointments, etc.  Non-urgent messages can be sent to your provider as well.   To learn more about what you can do with MyChart, go to [email or web address].    Your next appointment:   1 year(s)  The format for your next appointment:   In Person  Provider:   Virl Axe, MD    Other Instructions N/a  Important Information About Sugar

## 2022-06-05 ENCOUNTER — Encounter: Payer: Self-pay | Admitting: Gastroenterology

## 2022-06-14 ENCOUNTER — Ambulatory Visit (INDEPENDENT_AMBULATORY_CARE_PROVIDER_SITE_OTHER): Payer: BC Managed Care – PPO | Admitting: Dermatology

## 2022-06-14 DIAGNOSIS — L821 Other seborrheic keratosis: Secondary | ICD-10-CM | POA: Diagnosis not present

## 2022-06-14 DIAGNOSIS — L578 Other skin changes due to chronic exposure to nonionizing radiation: Secondary | ICD-10-CM | POA: Diagnosis not present

## 2022-06-14 DIAGNOSIS — L820 Inflamed seborrheic keratosis: Secondary | ICD-10-CM

## 2022-06-14 NOTE — Progress Notes (Signed)
   Follow-Up Visit   Subjective  [name] is a 56 y.o. male who presents for the following: Other (Spot of right temple x ~1 month). The patient has spots, moles and lesions to be evaluated, some may be new or changing and the patient has concerns that these could be cancer.  The following portions of the chart were reviewed this encounter and updated as appropriate:   Tobacco  Allergies  Meds  Problems  Med Hx  Surg Hx  Fam Hx     Review of Systems:  No other skin or systemic complaints except as noted in HPI or Assessment and Plan.  Objective  Well appearing patient in no apparent distress; mood and affect are within normal limits.  A focused examination was performed including face. Relevant physical exam findings are noted in the Assessment and Plan.  Right Temple Erythematous stuck-on, waxy papule or plaque   Assessment & Plan   Seborrheic Keratoses - Stuck-on, waxy, tan-brown papules and/or plaques  - Benign-appearing - Discussed benign etiology and prognosis. - Observe - Call for any changes  Inflamed seborrheic keratosis Right Temple  Destruction of lesion - Right Temple Complexity: simple   Destruction method: cryotherapy   Informed consent: discussed and consent obtained   Timeout:  patient name, date of birth, surgical site, and procedure verified Lesion destroyed using liquid nitrogen: Yes   Region frozen until ice ball extended beyond lesion: Yes   Outcome: patient tolerated procedure well with no complications   Post-procedure details: wound care instructions given    Actinic Damage - chronic, secondary to cumulative UV radiation exposure/sun exposure over time - diffuse scaly erythematous macules with underlying dyspigmentation - Recommend daily broad spectrum sunscreen SPF 30+ to sun-exposed areas, reapply every 2 hours as needed.  - Recommend staying in the shade or wearing long sleeves, sun glasses (UVA+UVB protection) and wide brim  hats (4-inch brim around the entire circumference of the hat). - Call for new or changing lesions.  Return for Follow up as scheduled.  I, Ashok Cordia, CMA, am acting as scribe for Sarina Ser, MD . Documentation: I have reviewed the above documentation for accuracy and completeness, and I agree with the above.  Sarina Ser, MD

## 2022-06-14 NOTE — Patient Instructions (Signed)
Cryotherapy Aftercare  Wash gently with soap and water everyday.   Apply Vaseline and Band-Aid daily until healed.     Due to recent changes in healthcare laws, you may see results of your pathology and/or laboratory studies on MyChart before the doctors have had a chance to review them. We understand that in some cases there may be results that are confusing or concerning to you. Please understand that not all results are received at the same time and often the doctors may need to interpret multiple results in order to provide you with the best plan of care or course of treatment. Therefore, we ask that you please give us 2 business days to thoroughly review all your results before contacting the office for clarification. Should we see a critical lab result, you will be contacted sooner.   If You Need Anything After Your Visit  If you have any questions or concerns for your doctor, please call our main line at [phone] and press option 4 to reach your doctor's medical assistant. If no one answers, please leave a voicemail as directed and we will return your call as soon as possible. Messages left after 4 pm will be answered the following business day.   You may also send us a message via MyChart. We typically respond to MyChart messages within 1-2 business days.  For prescription refills, please ask your pharmacy to contact our office. Our fax number is [phone].  If you have an urgent issue when the clinic is closed that cannot wait until the next business day, you can page your doctor at the number below.    Please note that while we do our best to be available for urgent issues outside of office hours, we are not available 24/7.   If you have an urgent issue and are unable to reach us, you may choose to seek medical care at your doctor's office, retail clinic, urgent care center, or emergency room.  If you have a medical emergency, please immediately call 911 or go to the  emergency department.  Pager Numbers  - Dr. Kowalski: [phone]  - Dr. Moye: [phone]  - Dr. Stewart: [phone]  In the event of inclement weather, please call our main line at [phone] for an update on the status of any delays or closures.  Dermatology Medication Tips: Please keep the boxes that topical medications come in in order to help keep track of the instructions about where and how to use these. Pharmacies typically print the medication instructions only on the boxes and not directly on the medication tubes.   If your medication is too expensive, please contact our office at [phone] option 4 or send us a message through MyChart.   We are unable to tell what your co-pay for medications will be in advance as this is different depending on your insurance coverage. However, we may be able to find a substitute medication at lower cost or fill out paperwork to get insurance to cover a needed medication.   If a prior authorization is required to get your medication covered by your insurance company, please allow us 1-2 business days to complete this process.  Drug prices often vary depending on where the prescription is filled and some pharmacies may offer cheaper prices.  The website www.goodrx.com contains coupons for medications through different pharmacies. The prices here do not account for what the cost may be with help from insurance (it may be cheaper with your insurance), but the website can   give you the price if you did not use any insurance.  - You can print the associated coupon and take it with your prescription to the pharmacy.  - You may also stop by our office during regular business hours and pick up a GoodRx coupon card.  - If you need your prescription sent electronically to a different pharmacy, notify our office through [facility] MyChart or by phone at [phone] option 4.     Si Usted Necesita Algo Despus de Su Visita  Tambin puede  enviarnos un mensaje a travs de MyChart. Por lo general respondemos a los mensajes de MyChart en el transcurso de 1 a 2 das hbiles.  Para renovar recetas, por favor pida a su farmacia que se ponga en contacto con nuestra oficina. Nuestro nmero de fax es el [phone].  Si tiene un asunto urgente cuando la clnica est cerrada y que no puede esperar hasta el siguiente da hbil, puede llamar/localizar a su doctor(a) al nmero que aparece a continuacin.   Por favor, tenga en cuenta que aunque hacemos todo lo posible para estar disponibles para asuntos urgentes fuera del horario de oficina, no estamos disponibles las 24 horas del da, los 7 das de la semana.   Si tiene un problema urgente y no puede comunicarse con nosotros, puede optar por buscar atencin mdica  en el consultorio de su doctor(a), en una clnica privada, en un centro de atencin urgente o en una sala de emergencias.  Si tiene una emergencia mdica, por favor llame inmediatamente al 911 o vaya a la sala de emergencias.  Nmeros de bper  - Dr. Kowalski: [phone]  - Dra. Moye: [phone]  - Dra. Stewart: [phone]  En caso de inclemencias del tiempo, por favor llame a nuestra lnea principal al [phone] para una actualizacin sobre el estado de cualquier retraso o cierre.  Consejos para la medicacin en dermatologa: Por favor, guarde las cajas en las que vienen los medicamentos de uso tpico para ayudarle a seguir las instrucciones sobre dnde y cmo usarlos. Las farmacias generalmente imprimen las instrucciones del medicamento slo en las cajas y no directamente en los tubos del medicamento.   Si su medicamento es muy caro, por favor, pngase en contacto con nuestra oficina llamando al [phone] y presione la opcin 4 o envenos un mensaje a travs de MyChart.   No podemos decirle cul ser su copago por los medicamentos por adelantado ya que esto es diferente dependiendo de la cobertura de su seguro.  Sin embargo, es posible que podamos encontrar un medicamento sustituto a menor costo o llenar un formulario para que el seguro cubra el medicamento que se considera necesario.   Si se requiere una autorizacin previa para que su compaa de seguros cubra su medicamento, por favor permtanos de 1 a 2 das hbiles para completar este proceso.  Los precios de los medicamentos varan con frecuencia dependiendo del lugar de dnde se surte la receta y alguna farmacias pueden ofrecer precios ms baratos.  El sitio web www.goodrx.com tiene cupones para medicamentos de diferentes farmacias. Los precios aqu no tienen en cuenta lo que podra costar con la ayuda del seguro (puede ser ms barato con su seguro), pero el sitio web puede darle el precio si no utiliz ningn seguro.  - Puede imprimir el cupn correspondiente y llevarlo con su receta a la farmacia.  - Tambin puede pasar por nuestra oficina durante el horario de atencin regular y recoger una tarjeta de cupones de GoodRx.  -   Si necesita que su receta se enve electrnicamente a una farmacia diferente, informe a nuestra oficina a travs de MyChart de [place] o por telfono llamando al [phone] y presione la opcin 4.  

## 2022-06-23 ENCOUNTER — Encounter: Payer: Self-pay | Admitting: Dermatology

## 2022-06-25 ENCOUNTER — Other Ambulatory Visit: Payer: Self-pay | Admitting: Family Medicine

## 2022-06-25 DIAGNOSIS — R1011 Right upper quadrant pain: Secondary | ICD-10-CM

## 2022-06-27 ENCOUNTER — Ambulatory Visit
Admission: RE | Admit: 2022-06-27 | Discharge: 2022-06-27 | Disposition: A | Discharge status: Home / Self Care | Payer: No Typology Code available for payment source | Source: Ambulatory Visit | Attending: Family Medicine | Admitting: Family Medicine

## 2022-06-27 DIAGNOSIS — R1011 Right upper quadrant pain: Secondary | ICD-10-CM | POA: Insufficient documentation

## 2022-08-22 ENCOUNTER — Ambulatory Visit (INDEPENDENT_AMBULATORY_CARE_PROVIDER_SITE_OTHER): Payer: BC Managed Care – PPO | Admitting: Dermatology

## 2022-08-22 DIAGNOSIS — Z85828 Personal history of other malignant neoplasm of skin: Secondary | ICD-10-CM

## 2022-08-22 DIAGNOSIS — D229 Melanocytic nevi, unspecified: Secondary | ICD-10-CM

## 2022-08-22 DIAGNOSIS — L814 Other melanin hyperpigmentation: Secondary | ICD-10-CM | POA: Diagnosis not present

## 2022-08-22 DIAGNOSIS — Z1283 Encounter for screening for malignant neoplasm of skin: Secondary | ICD-10-CM

## 2022-08-22 DIAGNOSIS — L578 Other skin changes due to chronic exposure to nonionizing radiation: Secondary | ICD-10-CM

## 2022-08-22 DIAGNOSIS — L821 Other seborrheic keratosis: Secondary | ICD-10-CM

## 2022-08-22 NOTE — Progress Notes (Signed)
Follow-Up Visit   Subjective  [name] is a 57 y.o. male who presents for the following: Annual Exam (6 month tbse, hx of aks, hx of bcc ).  Patient reports a spot at nose.  The patient presents for Total-Body Skin Exam (TBSE) for skin cancer screening and mole check.  The patient has spots, moles and lesions to be evaluated, some may be new or changing and the patient has concerns that these could be cancer.   The following portions of the chart were reviewed this encounter and updated as appropriate:  Tobacco  Allergies  Meds  Problems  Med Hx  Surg Hx  Fam Hx      Review of Systems: No other skin or systemic complaints except as noted in HPI or Assessment and Plan.   Objective  Well appearing patient in no apparent distress; mood and affect are within normal limits.  A full examination was performed including scalp, head, eyes, ears, nose, lips, neck, chest, axillae, abdomen, back, buttocks, bilateral upper extremities, bilateral lower extremities, hands, feet, fingers, toes, fingernails, and toenails. All findings within normal limits unless otherwise noted below.    Assessment & Plan   Lentigines - Scattered tan macules - Due to sun exposure - Benign-appearing, observe - Recommend daily broad spectrum sunscreen SPF 30+ to sun-exposed areas, reapply every 2 hours as needed. - Call for any changes  Seborrheic Keratoses - Stuck-on, waxy, tan-brown papules and/or plaques  - Benign-appearing - Discussed benign etiology and prognosis. - Observe - Call for any changes  Melanocytic Nevi - Tan-brown and/or pink-flesh-colored symmetric macules and papules - Benign appearing on exam today - Observation - Call clinic for new or changing moles - Recommend daily use of broad spectrum spf 30+ sunscreen to sun-exposed areas.   Hemangiomas - Red papules - Discussed benign nature - Observe - Call for any changes  Actinic Damage with PreCancerous Actinic  Keratoses Counseling for Topical Chemotherapy Management: Patient exhibits: - Severe, confluent actinic changes with pre-cancerous actinic keratoses that is secondary to cumulative UV radiation exposure over time - Condition that is severe; chronic, not at goal. - diffuse scaly erythematous macules and papules with underlying dyspigmentation - Discussed Prescription "Field Treatment" topical Chemotherapy for Severe, Chronic Confluent Actinic Changes with Pre-Cancerous Actinic Keratoses Field treatment involves treatment of an entire area of skin that has confluent Actinic Changes (Sun/ Ultraviolet light damage) and PreCancerous Actinic Keratoses by method of PhotoDynamic Therapy (PDT) and/or prescription Topical Chemotherapy agents such as 5-fluorouracil, 5-fluorouracil/calcipotriene, and/or imiquimod.  The purpose is to decrease the number of clinically evident and subclinical PreCancerous lesions to prevent progression to development of skin cancer by chemically destroying early precancer changes that may or may not be visible.  It has been shown to reduce the risk of developing skin cancer in the treated area. As a result of treatment, redness, scaling, crusting, and open sores may occur during treatment course. One or more than one of these methods may be used and may have to be used several times to control, suppress and eliminate the PreCancerous changes. Discussed treatment course, expected reaction, and possible side effects. - Recommend daily broad spectrum sunscreen SPF 30+ to sun-exposed areas, reapply every 2 hours as needed.  - Staying in the shade or wearing long sleeves, sun glasses (UVA+UVB protection) and wide brim hats (4-inch brim around the entire circumference of the hat) are also recommended. - Call for new or changing lesions. - Will schedule Red light photodynamic therapy with  debridement for face  History of Basal Cell Carcinoma of the Skin Multiple locations see history - No  evidence of recurrence today - Recommend regular full body skin exams - Recommend daily broad spectrum sunscreen SPF 30+ to sun-exposed areas, reapply every 2 hours as needed.  - Call if any new or changing lesions are noted between office visits  Skin cancer screening performed today.  Garry Heater, CMA, am acting as scribe for Forest Gleason, MD.  Return in about 6 months (around 02/20/2023) for tbse hx of bcc.  Documentation: I have reviewed the above documentation for accuracy and completeness, and I agree with the above.  Forest Gleason, MD

## 2022-08-22 NOTE — Patient Instructions (Addendum)
Photodynamic Therapy/Red Light Therapy  Actinic keratoses are the dry, red scaly spots on the skin caused by sun damage. A portion of these spots can turn into skin cancer with time, and treating them can help prevent development of skin cancer.   Treatment of these spots requires removal of the defective skin cells. There are various ways to remove actinic keratoses, including freezing with liquid nitrogen, treatment with creams, or treatment with a blue light procedure in the office.   Photodynamic Therapy (PDT), also known as "Red light therapy" is an in office procedure used to treat actinic keratoses. It works by targeting precancerous cells. After treatment, these cells peel off and are replaced by healthy ones.   For your phototherapy appointment, you will have two appointments on the day of your treatment. The first appointment will be to apply a cream to the treatment area. You will leave this cream on for 1-2 hours depending on the area being treated. The second appointment will be to shine a blue light on the area for 16 minutes to kill off the precancer cells. It is common to experience a burning sensation during the treatment.  After your treatment, it will be important to keep the treated areas of skin out of the sun completely for 48-72 hours (2-3 days) to prevent having a reaction.   Common side effects include: - Burning or stinging, which may be severe and can last up to 24-72 hours after your treatment - Scaling and crusting which may last up to 2 weeks - Redness, swelling and/or peeling which can last up to 4 weeks  To Care for Your Skin After PDT/Red Light Therapy: - Wash with soap, water and shampoo as normal. - If needed, you can use cold compresses (e.g. ice packs) for comfort - If okay with your primary care doctor, you may use analgesics such as acetaminophen (tylenol) every 4-6 hours, not to exceed recommended dose - You may apply Cerave Healing Ointment, Vaseline or  Aquaphor as needed - If you have a lot of swelling you may take a Benadryl to help with this (this may cause drowsiness), not to exceed recommended dose. This may increase the risk of falls in people over 65 and may slow reaction time while driving, so it is not recommended to take before driving or operating machinery. - Sun Precautions - Wear a wide brim hat for the next week if outside  - Wear a sunblock with zinc or titanium dioxide at least SPF 50 daily  If you have any questions or concerns, please call the office and ask to speak with a nurse.   --------------------------------------------------------------------------------------------------------------    Recommend taking Heliocare sun protection supplement daily in sunny weather for additional sun protection. For maximum protection on the sunniest days, you can take up to 2 capsules of regular Heliocare OR take 1 capsule of Heliocare Ultra. For prolonged exposure (such as a full day in the sun), you can repeat your dose of the supplement 4 hours after your first dose. Heliocare can be purchased at [employer], at some [facility] or at [email or web address].      Melanoma ABCDEs  Melanoma is the most dangerous type of skin cancer, and is the leading cause of death from skin disease.  You are more likely to develop melanoma if you: Have light-colored skin, light-colored eyes, or red or blond hair Spend a lot of time in the sun Tan regularly, either outdoors or in a tanning bed Have had blistering sunburns,  especially during childhood Have a close family member who has had a melanoma Have atypical moles or large birthmarks  Early detection of melanoma is key since treatment is typically straightforward and cure rates are extremely high if we catch it early.   The first sign of melanoma is often a change in a mole or a new dark spot.  The ABCDE system is a way of remembering the signs of melanoma.  A for asymmetry:  The two  halves do not match. B for border:  The edges of the growth are irregular. C for color:  A mixture of colors are present instead of an even brown color. D for diameter:  Melanomas are usually (but not always) greater than 5m - the size of a pencil eraser. E for evolution:  The spot keeps changing in size, shape, and color.  Please check your skin once per month between visits. You can use a small mirror in front and a large mirror behind you to keep an eye on the back side or your body.   If you see any new or changing lesions before your next follow-up, please call to schedule a visit.  Please continue daily skin protection including broad spectrum sunscreen SPF 30+ to sun-exposed areas, reapplying every 2 hours as needed when you're outdoors.   Staying in the shade or wearing long sleeves, sun glasses (UVA+UVB protection) and wide brim hats (4-inch brim around the entire circumference of the hat) are also recommended for sun protection.    Due to recent changes in healthcare laws, you may see results of your pathology and/or laboratory studies on MyChart before the doctors have had a chance to review them. We understand that in some cases there may be results that are confusing or concerning to you. Please understand that not all results are received at the same time and often the doctors may need to interpret multiple results in order to provide you with the best plan of care or course of treatment. Therefore, we ask that you please give u[place]2 business days to thoroughly review all your results before contacting the office for clarification. Should we see a critical lab result, you will be contacted sooner.   If You Need Anything After Your Visit  If you have any questions or concerns for your doctor, please call our main line at [phone] press option 4 to reach your doctor's medical assistant. If no one answers, please leave a voicemail as directed and we will return your call as soon  as possible. Messages left after 4 pm will be answered the following business day.   You may also send [place] message via [facility] We typically respond to MyChart messages within 1-2 business days.  For prescription refills, please ask your pharmacy to contact our office. Our fax number is [phone]  If you have an urgent issue when the clinic is closed that cannot wait until the next business day, you can page your doctor at the number below.    Please note that while we do our best to be available for urgent issues outside of office hours, we are not available 24/7.   If you have an urgent issue and are unable to reach [place] you may choose to seek medical care at your doctor's office, retail clinic, urgent care center, or emergency room.  If you have a medical emergency, please immediately call 911 or go to the emergency department.  Pager Numbers  - Dr. KNehemiah Massed [phone] -  Dr. Laurence Ferrari: [phone]  - Dr. Nicole Kindred: [phone]  In the event of inclement weather, please call our main line at [phone] for an update on the status of any delays or closures.  Dermatology Medication Tips: Please keep the boxes that topical medications come in in order to help keep track of the instructions about where and how to use these. Pharmacies typically print the medication instructions only on the boxes and not directly on the medication tubes.   If your medication is too expensive, please contact our office at [phone] option 4 or send [place] a message through [place].   We are unable to tell what your co-pay for medications will be in advance as this is different depending on your insurance coverage. However, we may be able to find a substitute medication at lower cost or fill out paperwork to get insurance to cover a needed medication.   If a prior authorization is required to get your medication covered by your insurance company, please allow [place] 1-2 business days to complete this  process.  Drug prices often vary depending on where the prescription is filled and some pharmacies may offer cheaper prices.  The website www.goodrx.com contains coupons for medications through different pharmacies. The prices here do not account for what the cost may be with help from insurance (it may be cheaper with your insurance), but the website can give you the price if you did not use any insurance.  - You can print the associated coupon and take it with your prescription to the pharmacy.  - You may also stop by our office during regular business hours and pick up a GoodRx coupon card.  - If you need your prescription sent electronically to a different pharmacy, notify our office through [facility] or by phone at [phone] option 4.     Si Usted Necesita Algo Despus de Su Visita  Tambin puede enviarnos un mensaje a travs de Pharmacist, community. Por lo general respondemos a los mensajes de MyChart en el transcurso de 1 a 2 das hbiles.  Para renovar recetas, por favor pida a su farmacia que se ponga en contacto con nuestra oficina. Harland Dingwall de fax es [place] [phone].  Si tiene un asunto urgente cuando la clnica est cerrada y que no puede esperar hasta el siguiente da hbil, puede llamar/localizar a su doctor(a) al nmero que aparece a continuacin.   Por favor, tenga en cuenta que aunque hacemos todo lo posible para estar disponibles para asuntos urgentes fuera del horario de [place], no estamos disponibles las 24 horas del da, los 7 das de la [place].   Si tiene un problema urgente y no puede comunicarse con nosotros, puede optar por buscar atencin mdica  en el consultorio de su doctor(a), en una clnica privada, en un centro de atencin urgente o en una sala de emergencias.  Si tiene Engineering geologist, por favor llame inmediatamente al 911 o vaya a la sala de emergencias.  Nmeros de bper  - Dr. Nehemiah Massed: [phone]  - Dra. Moye: [phone]  - Dra.  Nicole Kindred: [phone]  En caso de inclemencias del [place], por favor llame a Johnsie Kindred principal al [phone] para una actualizacin sobre el [place] de cualquier retraso o cierre.  Consejos para la medicacin en dermatologa: Por favor, guarde las cajas en las que vienen los medicamentos de uso tpico para ayudarle a seguir las instrucciones sobre dnde y cmo usarlos. Las farmacias generalmente imprimen las instrucciones del medicamento slo en las cajas y  no directamente en los tubos del medicamento.   Si su medicamento es muy caro, por favor, pngase en contacto con Zigmund Daniel llamando al [phone] y presione la opcin 4 o envenos un mensaje a travs de Pharmacist, community.   No podemos decirle cul ser su copago por los medicamentos por adelantado ya que esto es diferente dependiendo de la cobertura de su seguro. Sin embargo, es posible que podamos encontrar un medicamento sustituto a Electrical engineer un formulario para que el seguro cubra el medicamento que se considera necesario.   Si se requiere una autorizacin previa para que su compaa de seguros [place] su medicamento, por favor permtanos de 1 a 2 das hbiles para completar este proceso.  Los precios de los medicamentos varan con frecuencia dependiendo del Environmental consultant de dnde se surte la receta y alguna farmacias pueden ofrecer precios ms baratos.  El sitio web www.goodrx.com tiene cupones para medicamentos de Airline pilot. Los precios aqu no tienen en cuenta lo que podra costar con la ayuda del seguro (puede ser ms barato con su seguro), pero el sitio web puede darle el precio si no utiliz Research scientist (physical sciences).  - Puede imprimir el cupn correspondiente y llevarlo con su receta a la farmacia.  - Tambin puede pasar por nuestra oficina durante el horario de atencin regular y Charity fundraiser una tarjeta de cupones de GoodRx.  - Si necesita que su receta se enve electrnicamente a una farmacia diferente, informe a nuestra oficina a  travs de MyChart de [place] o por telfono llamando al [phone] y presione la opcin 4.

## 2022-08-23 ENCOUNTER — Encounter: Payer: Self-pay | Admitting: Dermatology

## 2022-09-03 ENCOUNTER — Ambulatory Visit (INDEPENDENT_AMBULATORY_CARE_PROVIDER_SITE_OTHER): Payer: BC Managed Care – PPO | Admitting: Dermatology

## 2022-09-03 DIAGNOSIS — L570 Actinic keratosis: Secondary | ICD-10-CM | POA: Diagnosis not present

## 2022-09-03 MED ORDER — AMINOLEVULINIC ACID HCL 10 % EX GEL
2000.0000 mg | Freq: Once | CUTANEOUS | Status: AC
  Administered 2022-09-03: 2000 mg via TOPICAL

## 2022-09-03 NOTE — Patient Instructions (Signed)

## 2022-09-03 NOTE — Progress Notes (Signed)
Patient completed red light photodynamic therapy today with debridement.  1. AK (actinic keratosis) Head - Anterior (Face)  Photodynamic therapy - Head - Anterior (Face) Procedure discussed: discussed risks, benefits, side effects. and alternatives   Prep: site scrubbed/prepped with acetone   Debridement needed: Yes   Location:  Face Number of lesions:  Multiple Type of treatment:  Red light Aminolevulinic Acid (see MAR for details): Ameluz Amount of Ameluz (mg):  1 Incubation time (minutes):  60 Number of minutes under lamp:  16 Number of seconds under lamp:  40 Cooling:  Floor fan and fan Outcome: patient tolerated procedure well with no complications   Post-procedure details: sunscreen applied    Related Medications Aminolevulinic Acid HCl 10 % GEL 2,000 mg   Patient could only come this afternoon and Dr. Laurence Ferrari out of the office. Dr. Brendolyn [name] Oked debridement.   [name], [name]  I personally debrided area prior to application of aminolevulinic acid  [name]  Documentation: I have reviewed the above documentation for accuracy and completeness, and I agree with the above.  [name]

## 2022-10-09 ENCOUNTER — Encounter: Payer: Self-pay | Admitting: Internal Medicine

## 2022-10-09 ENCOUNTER — Telehealth: Payer: Self-pay | Admitting: Internal Medicine

## 2022-10-09 ENCOUNTER — Ambulatory Visit: Payer: No Typology Code available for payment source | Attending: Cardiology | Admitting: Internal Medicine

## 2022-10-09 VITALS — Wt 257.0 lb

## 2022-10-09 DIAGNOSIS — I428 Other cardiomyopathies: Secondary | ICD-10-CM | POA: Diagnosis not present

## 2022-10-09 DIAGNOSIS — E783 Hyperchylomicronemia: Secondary | ICD-10-CM | POA: Diagnosis not present

## 2022-10-09 DIAGNOSIS — I25118 Atherosclerotic heart disease of native coronary artery with other forms of angina pectoris: Secondary | ICD-10-CM | POA: Diagnosis not present

## 2022-10-09 MED ORDER — ICOSAPENT ETHYL 1 G PO CAPS: 4.0000 g | ORAL_CAPSULE | Freq: Two times a day (BID) | ORAL | 3 refills | Status: DC

## 2022-10-09 NOTE — Telephone Encounter (Signed)
Patient had virtual visit with Dr. Debara Pickett today  He was prescribed vascepa 4 grams twice daily If this is not approved, generic is OK  Routed to prior auth team

## 2022-10-09 NOTE — Patient Instructions (Addendum)
Medication Instructions:  STOP over-the-counter fish oil   START icosapent ethyl (vascepa) 4 capsules (4 gram) twice daily -- printed prescription will be mailed  *If you need a refill on your cardiac medications before your next appointment, please call your pharmacy*   Lab Work: FASTING lab work to check cholesterol in 3-4 months  If you have labs (blood work) drawn today and your tests are completely normal, you will receive your results only by: [place] (if you have MyChart) OR A paper copy in the mail If you have any lab test that is abnormal or we need to change your treatment, we will call you to review the results.   Testing/Procedures: Genetic Testing -- please let us know if you wish to proceed with this    Follow-Up: At [facility], you and your health needs are our priority.  As part of our continuing mission to provide you with exceptional heart care, we have created designated Provider Care Teams.  These Care Teams include your primary Cardiologist (physician) and Advanced Practice Providers (APPs -  Physician Assistants and Nurse Practitioners) who all work together to provide you with the care you need, when you need it.  We recommend signing up for the patient portal called "MyChart".  Sign up information is provided on this After Visit Summary.  MyChart is used to connect with patients for Virtual Visits (Telemedicine).  Patients are able to view lab/test results, encounter notes, upcoming appointments, etc.  Non-urgent messages can be sent to your provider as well.   To learn more about what you can do with MyChart, go to [email or web address].    Your next appointment:    3-4 months with Dr. Debara Pickett

## 2022-10-09 NOTE — Progress Notes (Signed)
Virtual Visit via Video Note   Because of [name]'s co-morbid illnesses, he is at least at moderate risk for complications without adequate follow up.  This format is felt to be most appropriate for this patient at this time.  All issues noted in this document were discussed and addressed.  A limited physical exam was performed with this format.  Please refer to the patient's chart for his consent to telehealth for [facility].      Date:  10/09/2022   ID:  [name], DOB 16-Apr-1966, MRN [id number] The patient was identified using 2 identifiers.  Evaluation Performed:  New Patient Evaluation  Patient Location:  [place] [id number]  Provider location:   [address], [address] [place], [place] [address]  PCP:  Adin Hector, MD  Cardiologist:  Virl Axe, MD Electrophysiologist:  Virl Axe, MD   Chief Complaint:  High triglycerides  History of Present Illness:    [name] is a 57 y.o. male who presents via audio/video conferencing for a telehealth visit today.  This is a pleasant patient of Dr. Caryl Comes with a history of high triglycerides in the past as well as coronary artery disease by coronary CT angiography which was age advanced and a presumed nonischemic cardiomyopathy as well as an extensive history of heart disease in his family.  He reports elevated cholesterol even in the distant past when he was more active and in the [employer].  He has had difficulty in controlling it.  He was previously on atorvastatin 20 mg daily and recently switched to rosuvastatin 10 mg daily because of some potential side effects.  He is also been on fenofibrate 48 mg daily and takes 500 mg fish oil over-the-counter.  Recent labs in January 2024 showed total cholesterol 235, triglycerides 692 and LDL was not assessed.  He is not diabetic and has had normal blood sugars.  Third for evaluation of high triglycerides and consideration of  possible genetic testing.  As mentioned he does report heart disease in multiple family members and including his son who died at age 21 of a heart related condition but could not clearly say that family members had high triglycerides.  Prior CV studies:   The following studies were reviewed today:  Lab work, chart reviewed  PMHx:  Past Medical History:  Diagnosis Date   Anxiety    Arthritis    gout   Atrial fibrillation ([place]) 1989   Basal cell carcinoma 06/23/2020   Upper mid back. Nodular.   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-no seizure since 1998    Hyperlipidemia    Hypertension    Seizures ([place]) 1998   with heat stroke in the [employer] -none since   throat cancer     Past Surgical History:  Procedure Laterality Date   CARDIAC CATHETERIZATION  03/2010   No sig coronary disease, EF ~40%   CARDIOVERSION  1989   TOTAL KNEE ARTHROPLASTY Left     FAMHx:  Family History  Problem Relation Age  of Onset   Heart attack Father        MI   Crohn's disease Brother    Diabetes Mother    Depression Mother    Arthritis Mother    COPD Mother    Coronary artery disease Paternal Grandfather    Pancreatic cancer Paternal Uncle    Lung cancer Paternal Uncle    Lung cancer Maternal Grandfather    Thyroid cancer Paternal Aunt    Lung cancer Paternal Uncle    Hypertension Neg Hx    Colon cancer Neg Hx    Colon polyps Neg Hx    Esophageal cancer Neg Hx    Rectal cancer Neg Hx    Stomach cancer Neg Hx     SOCHx:   reports that he has never smoked. He has never used smokeless tobacco. He reports current alcohol  use of about 2.0 - 3.0 standard drinks of alcohol per week. He reports that he does not use drugs.  ALLERGIES:  No Known Allergies  MEDS:  Current Meds  Medication Sig   allopurinol (ZYLOPRIM) 300 MG tablet Take 300 mg by mouth daily.   b complex vitamins capsule Take 1 capsule by mouth daily.   carvedilol (COREG) 25 MG tablet 12.5 mg 2 (two) times daily with a meal.    Coenzyme Q10 (CO Q-10) 100 MG CAPS Take 1 capsule by mouth daily.   cyclobenzaprine (FLEXERIL) 10 MG tablet Take by mouth daily.   Docusate Sodium 100 MG capsule Take 100 mg by mouth daily.   empagliflozin (JARDIANCE) 25 MG TABS tablet Take 25 mg by mouth daily.   ENTRESTO 97-103 MG Take 1 tablet by mouth 2 (two) times daily.   fenofibrate (TRICOR) 48 MG tablet Take 1 tablet by mouth daily.   FLUOXETINE HCL PO Take 80 mg by mouth daily.   Ibuprofen 200 MG CAPS Take 800 mg by mouth 2 (two) times daily as needed (pain).   icosapent Ethyl (VASCEPA) 1 g capsule Take 4 capsules (4 g total) by mouth 2 (two) times daily.   levothyroxine (SYNTHROID) 200 MCG tablet Take 200 mcg by mouth daily.   meclizine (ANTIVERT) 25 MG tablet Take 1 tablet (25 mg total) by mouth 3 (three) times daily as needed for dizziness.   Melatonin 5 MG CAPS Take 10 mg by mouth at bedtime. Takes 2 capsules po at bedtime for sleep.   mirtazapine (REMERON) 15 MG tablet Take 7.5 mg by mouth daily.   Multiple Vitamin (MULTIVITAMIN) tablet Take 1 tablet by mouth daily.   mupirocin ointment (BACTROBAN) 2 % Apply 1 application topically daily.   Niacinamide 500 MG TBCR Take 1 tablet by mouth in the morning and at bedtime. For skin cancer prevention.   Polypodium Leucotomos (HELIOCARE) 240 MG CAPS Take 240 mg by mouth every evening.   rivaroxaban (XARELTO) 20 MG TABS tablet Take 1 tablet (20 mg total) by mouth daily.   rosuvastatin (CRESTOR) 10 MG tablet Take 1 tablet (10 mg total) by mouth daily.   spironolactone (ALDACTONE) 25 MG tablet Take 25 mg by mouth  daily.   THERATEARS 0.25 % SOLN Place 1 drop into both eyes as needed (dry eyes).   Turmeric 500 MG TABS Take by mouth daily.   vitamin C (ASCORBIC ACID) 500 MG tablet Take 500 mg by mouth daily.    [DISCONTINUED] Omega-3 Fatty Acids (FISH OIL) 500 MG CAPS Take 1 capsule by mouth daily.     ROS: Pertinent items noted in HPI and  remainder of comprehensive ROS otherwise negative.  Labs/Other Tests and Data Reviewed:    Recent Labs: No results found for requested labs within last 365 days.   Recent Lipid Panel Lab Results  Component Value Date/Time   CHOL 253 (H) 10/25/2017 08:14 AM   TRIG 289 (H) 10/25/2017 08:14 AM   HDL 38 (L) 10/25/2017 08:14 AM   CHOLHDL 6.7 (H) 10/25/2017 08:14 AM   CHOLHDL 4 06/26/2016 10:27 AM   LDLCALC 157 (H) 10/25/2017 08:14 AM   LDLDIRECT 102.0 06/26/2016 10:27 AM    Wt Readings from Last 3 Encounters:  10/09/22 257 lb (116.6 kg)  05/08/22 257 lb 12.8 oz (116.9 kg)  02/23/22 248 lb (112.5 kg)     Exam:    Vital Signs:  Wt 257 lb (116.6 kg)   BMI 35.84 kg/m    General appearance: alert, no distress, and moderately obese Lungs: No respiratory difficulty Abdomen: Obese Extremities: extremities normal, atraumatic, no cyanosis or edema Neurologic: Grossly normal  ASSESSMENT & PLAN:    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  The main concerns here are development of cardiovascular disease which seems to be pervasive in the family as well as risk for pancreatitis.  He has been on low-dose fenofibrate, over-the-counter fish oil (500 mg), and atorvastatin 20 mg daily until recently being switched to rosuvastatin 10 mg daily in January.  He seems  to be tolerating this well.  He would likely benefit from Vascepa with more potent triglyceride lowering and antioxidant effects.  I advise stopping over-the-counter fish oil and starting Vascepa 2 g twice daily.  If necessary we could further increase his fenofibrate up to 160 mg daily as his renal function appears to be normal.  Will plan repeat lipids including NMR, APO B and LP(a) in about 3 to 4 months.  We also discussed genetic testing and he will look into the cost of this through the [place] and get back to me if he wants to proceed with that.  Unfortunately the triglyceride lowering clinical trials that we were involved and have recently closed and I do not think we have a current option regarding this from a research standpoint.  Thanks again for the kind referral.  I will plan to follow-up with him in about 3 to 4 months.  Patient Risk:   After full review of this patients clinical status, I feel that they are at least moderate risk at this time.  Time:   Today, I have spent 25 minutes with the patient with telehealth technology discussing high triglycerides.     Medication Adjustments/Labs and Tests Ordered: Current medicines are reviewed at length with the patient today.  Concerns regarding medicines are outlined above.   Tests Ordered: Orders Placed This Encounter  Procedures   NMR, lipoprofile   Lipoprotein A (LPA)   Apolipoprotein B    Medication Changes: Meds ordered this encounter  Medications   icosapent Ethyl (VASCEPA) 1 g capsule    Sig: Take 4 capsules (4 g total) by mouth 2 (two) times daily.    Dispense:  720 capsule    Refill:  3    Disposition:  in 4 month(s)  Pixie Casino, MD, [facility], [place] Director of the Advanced Lipid  Disorders &  Cardiovascular Risk Reduction Clinic Diplomate of the American Board of Clinical Lipidology Attending Cardiologist  Direct Dial: [phone]  Fax: [phone]  Website:   [email or web address]  Pixie Casino, MD  10/09/2022 10:04 AM

## 2022-10-10 ENCOUNTER — Other Ambulatory Visit (HOSPITAL_COMMUNITY): Payer: Self-pay

## 2022-10-10 ENCOUNTER — Telehealth: Payer: Self-pay

## 2022-10-10 MED ORDER — ICOSAPENT ETHYL 1 G PO CAPS: 4.0000 g | ORAL_CAPSULE | Freq: Two times a day (BID) | ORAL | 3 refills | Status: DC

## 2022-10-10 NOTE — Addendum Note (Signed)
Addended by: Fidel Levy on: 10/10/2022 10:33 AM   Modules accepted: Orders

## 2022-10-10 NOTE — Telephone Encounter (Signed)
Pharmacy Patient Advocate Encounter  Received notification from [facility] that the request for prior authorization for Vascepa has been denied due to .     Please note that the insurance (will) only cover brand of Vascepa if requirements are met and so generic (iscosapent) is not covered.

## 2022-10-11 ENCOUNTER — Other Ambulatory Visit (HOSPITAL_COMMUNITY): Payer: Self-pay

## 2022-10-11 NOTE — Telephone Encounter (Signed)
Pharmacy Patient Advocate Encounter  Prior Authorization for Vascepa(brand) has been approved by [facility] (ins).  Done via [facility] representative w/additional info has been Approved  PA # [id number]  Effective dates: 3.7.24 through 3.7.27

## 2022-10-11 NOTE — Telephone Encounter (Signed)
Tipps, [name], CPhT  You11 minutes ago (1:15 PM)   P/a for Vascepa is approved til 10/11/22 through 10/10/25

## 2022-11-16 ENCOUNTER — Other Ambulatory Visit: Payer: Self-pay

## 2022-11-16 MED ORDER — ICOSAPENT ETHYL 1 G PO CAPS: 4.0000 g | ORAL_CAPSULE | Freq: Two times a day (BID) | ORAL | 3 refills | Status: AC

## 2023-01-15 DIAGNOSIS — Z923 Personal history of irradiation: Secondary | ICD-10-CM | POA: Insufficient documentation

## 2023-01-15 DIAGNOSIS — Z9221 Personal history of antineoplastic chemotherapy: Secondary | ICD-10-CM | POA: Insufficient documentation

## 2023-01-15 DIAGNOSIS — Z85818 Personal history of malignant neoplasm of other sites of lip, oral cavity, and pharynx: Secondary | ICD-10-CM | POA: Insufficient documentation

## 2023-01-22 ENCOUNTER — Ambulatory Visit
Payer: No Typology Code available for payment source | Admitting: Student in an Organized Health Care Education/Training Program

## 2023-01-23 ENCOUNTER — Ambulatory Visit
Payer: No Typology Code available for payment source | Attending: Student in an Organized Health Care Education/Training Program | Admitting: Student in an Organized Health Care Education/Training Program

## 2023-01-23 ENCOUNTER — Encounter: Payer: Self-pay | Admitting: Student in an Organized Health Care Education/Training Program

## 2023-01-23 VITALS — BP 107/58 | HR 81 | Temp 97.9°F | Resp 14 | Ht 71.0 in | Wt 250.0 lb

## 2023-01-23 DIAGNOSIS — M47816 Spondylosis without myelopathy or radiculopathy, lumbar region: Secondary | ICD-10-CM | POA: Insufficient documentation

## 2023-01-23 DIAGNOSIS — G894 Chronic pain syndrome: Secondary | ICD-10-CM | POA: Diagnosis not present
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
## 2023-01-23 NOTE — Patient Instructions (Signed)
Moderate Conscious Sedation, Adult Sedation is the use of medicines to help you relax and not feel pain. Moderate conscious sedation is a type of sedation that makes you less alert than normal. You are still able to respond to instructions, touch, or both. This type of sedation is used during short medical and dental procedures. It is milder than deep sedation, which is a type of sedation you cannot be easily woken up from. It is also milder than general anesthesia, which is the use of medicines to make you fall asleep. Moderate conscious sedation lets you return to your normal activities sooner. Tell a health care provider about: Any allergies you have. All medicines you are taking, including vitamins, herbs, steroids, eye drops, creams, and over-the-counter medicines. Any problems you or family members have had with anesthesia. Any bleeding problems you have. Any surgeries you have had. Any medical conditions you have. Whether you are pregnant or may be pregnant. Any recent alcohol, tobacco, or drug use. What are the risks? Your health care provider will talk with you about risks. These may include: Oversedation. This is when you get too much medicine. Nausea or vomiting. Allergic reaction to medicines. Trouble breathing. If this happens, a breathing tube may be used. It will be removed when you can breathe better on your own. Heart trouble. Lung trouble. Emergence delirium. This is when you feel confused while the sedation wears off. This gets better with time. What happens before the procedure? When to stop eating and drinking Follow instructions from your health care provider about what you may eat and drink. These may include: 8 hours before your procedure Stop eating most foods. Do not eat meat, fried foods, or fatty foods. Eat only light foods, such as toast or crackers. All liquids are okay except energy drinks and alcohol. 6 hours before your procedure Stop eating. Drink only  clear liquids, such as water, clear fruit juice, black coffee, plain tea, and sports drinks. Do not drink energy drinks or alcohol. 2 hours before your procedure Stop drinking all liquids. You may be allowed to take medicines with small sips of water. If you do not follow your health care provider's instructions, your procedure may be delayed or canceled. Medicines Ask your health care provider about: Changing or stopping your regular medicines. These include any diabetes medicines or blood thinners you take. Taking medicines such as aspirin and ibuprofen. These medicines can thin your blood. Do not take them unless your health care provider tells you to. Taking over-the-counter medicines, vitamins, herbs, and supplements. Tests and exams You may have an exam or testing. You may have a blood or urine sample taken. General instructions Do not use any products that contain nicotine or tobacco for at least 4 weeks before the procedure. These products include cigarettes, chewing tobacco, and vaping devices, such as e-cigarettes. If you need help quitting, ask your health care provider. If you will be going home right after the procedure, plan to have a responsible adult: Take you home from the hospital or clinic. You will not be allowed to drive. Care for you for the time you are told. What happens during the procedure?  You will be given the sedative. It may be given: As a pill you can take by mouth. It can also be put into the rectum. As a spray through the nose. As an injection into muscle. As an injection into a vein through an IV. You may be given oxygen as needed. Your blood pressure, heart  rate, breathing rate, and blood oxygen level will be monitored during the procedure. The medical or dental procedure will be done. The procedure may vary among health care providers and hospitals. What happens after the procedure? Your blood pressure, heart rate, breathing rate, and blood oxygen  level will be monitored until you leave the hospital or clinic. You will get fluids through an IV as needed. Do not drive or operate machinery until your health care provider says that it is safe. This information is not intended to replace advice given to you by your health care provider. Make sure you discuss any questions you have with your health care provider. Document Revised: 02/05/2022 Document Reviewed: 02/05/2022 Elsevier Patient Education  2024 [employer] GENERAL RISKS AND COMPLICATIONS  What are the risk, side effects and possible complications? Generally speaking, most procedures are safe.  However, with any procedure there are risks, side effects, and the possibility of complications.  The risks and complications are dependent upon the sites that are lesioned, or the type of nerve block to be performed.  The closer the procedure is to the spine, the more serious the risks are.  Great care is taken when placing the radio frequency needles, block needles or lesioning probes, but sometimes complications can occur. Infection: Any time there is an injection through the skin, there is a risk of infection.  This is why sterile conditions are used for these blocks.  There are four possible types of infection. Localized skin infection. Central Nervous System Infection-This can be in the form of Meningitis, which can be deadly. Epidural Infections-This can be in the form of an epidural abscess, which can cause pressure inside of the spine, causing compression of the spinal cord with subsequent paralysis. This would require an emergency surgery to decompress, and there are no guarantees that the patient would recover from the paralysis. Discitis-This is an infection of the intervertebral discs.  It occurs in about 1% of discography procedures.  It is difficult to treat and it may lead to surgery.        2. Pain: the needles have to go through skin and soft tissues, will cause soreness.        3. Damage to internal structures:  The nerves to be lesioned may be near blood vessels or    other nerves which can be potentially damaged.       4. Bleeding: Bleeding is more common if the patient is taking blood thinners such as  aspirin, Coumadin, Ticiid, Plavix, etc., or if he/she have some genetic predisposition  such as hemophilia. Bleeding into the spinal canal can cause compression of the spinal  cord with subsequent paralysis.  This would require an emergency surgery to  decompress and there are no guarantees that the patient would recover from the  paralysis.       5. Pneumothorax:  Puncturing of a lung is a possibility, every time a needle is introduced in  the area of the chest or upper back.  Pneumothorax refers to free air around the  collapsed lung(s), inside of the thoracic cavity (chest cavity).  Another two possible  complications related to a similar event would include: Hemothorax and Chylothorax.   These are variations of the Pneumothorax, where instead of air around the collapsed  lung(s), you may have blood or chyle, respectively.       6. Spinal headaches: They may occur with any procedures in the area of the spine.  7. Persistent CSF (Cerebro-Spinal Fluid) leakage: This is a rare problem, but may occur  with prolonged intrathecal or epidural catheters either due to the formation of a fistulous  track or a dural tear.       8. Nerve damage: By working so close to the spinal cord, there is always a possibility of  nerve damage, which could be as serious as a permanent spinal cord injury with  paralysis.       9. Death:  Although rare, severe deadly allergic reactions known as "Anaphylactic  reaction" can occur to any of the medications used.      10. Worsening of the symptoms:  We can always make thing worse.  What are the chances of something like this happening? Chances of any of this occuring are extremely low.  By statistics, you have more of a chance of getting killed in a  motor vehicle accident: while driving to the hospital than any of the above occurring .  Nevertheless, you should be aware that they are possibilities.  In general, it is similar to taking a shower.  Everybody knows that you can slip, hit your head and get killed.  Does that mean that you should not shower again?  Nevertheless always keep in mind that statistics do not mean anything if you happen to be on the wrong side of them.  Even if a procedure has a 1 (one) in a 1,000,000 (million) chance of going wrong, it you happen to be that one..Also, keep in mind that by statistics, you have more of a chance of having something go wrong when taking medications.  Who should not have this procedure? If you are on a blood thinning medication (e.g. Coumadin, Plavix, see list of "Blood Thinners"), or if you have an active infection going on, you should not have the procedure.  If you are taking any blood thinners, please inform your physician.  How should I prepare for this procedure? Do not eat or drink anything at least six hours prior to the procedure. Bring a driver with you .  It cannot be a taxi. Come accompanied by an adult that can drive you back, and that is strong enough to help you if your legs get weak or numb from the local anesthetic. Take all of your medicines the morning of the procedure with just enough water to swallow them. If you have diabetes, make sure that you are scheduled to have your procedure done first thing in the morning, whenever possible. If you have diabetes, take only half of your insulin dose and notify our nurse that you have done so as soon as you arrive at the clinic. If you are diabetic, but only take blood sugar pills (oral hypoglycemic), then do not take them on the morning of your procedure.  You may take them after you have had the procedure. Do not take aspirin or any aspirin-containing medications, at least eleven (11) days prior to the procedure.  They may prolong  bleeding. Wear loose fitting clothing that may be easy to take off and that you would not mind if it got stained with Betadine or blood. Do not wear any jewelry or perfume Remove any nail coloring.  It will interfere with some of our monitoring equipment.  NOTE: Remember that this is not meant to be interpreted as a complete list of all possible complications.  Unforeseen problems may occur.  BLOOD THINNERS The following drugs contain aspirin or other products, which can cause  increased bleeding during surgery and should not be taken for 2 weeks prior to and 1 week after surgery.  If you should need take something for relief of minor pain, you may take acetaminophen which is found in Tylenol,m Datril, Anacin-3 and Panadol. It is not blood thinner. The products listed below are.  Do not take any of the products listed below in addition to any listed on your instruction sheet.  A.P.C or A.P.C with Codeine Codeine Phosphate Capsules #3 Ibuprofen Ridaura  ABC compound Congesprin Imuran rimadil  Advil Cope Indocin Robaxisal  Alka-Seltzer Effervescent Pain Reliever and Antacid Coricidin or Coricidin-D  Indomethacin Rufen  Alka-Seltzer plus Cold Medicine Cosprin Ketoprofen S-A-C Tablets  Anacin Analgesic Tablets or Capsules Coumadin Korlgesic Salflex  Anacin Extra Strength Analgesic tablets or capsules CP-2 Tablets Lanoril Salicylate  Anaprox Cuprimine Capsules Levenox Salocol  Anexsia-D Dalteparin Magan Salsalate  Anodynos Darvon compound Magnesium Salicylate Sine-off  Ansaid Dasin Capsules Magsal Sodium Salicylate  Anturane Depen Capsules Marnal Soma  APF Arthritis pain formula Dewitt's Pills Measurin Stanback  Argesic Dia-Gesic Meclofenamic Sulfinpyrazone  Arthritis Bayer Timed Release Aspirin Diclofenac Meclomen Sulindac  Arthritis pain formula Anacin Dicumarol Medipren Supac  Analgesic (Safety coated) Arthralgen Diffunasal Mefanamic Suprofen  Arthritis Strength Bufferin Dihydrocodeine Mepro  Compound Suprol  Arthropan liquid Dopirydamole Methcarbomol with Aspirin Synalgos  ASA tablets/Enseals Disalcid Micrainin Tagament  Ascriptin Doan's Midol Talwin  Ascriptin A/D Dolene Mobidin Tanderil  Ascriptin Extra Strength Dolobid Moblgesic Ticlid  Ascriptin with Codeine Doloprin or Doloprin with Codeine Momentum Tolectin  Asperbuf Duoprin Mono-gesic Trendar  Aspergum Duradyne Motrin or Motrin IB Triminicin  Aspirin plain, buffered or enteric coated Durasal Myochrisine Trigesic  Aspirin Suppositories Easprin Nalfon Trillsate  Aspirin with Codeine Ecotrin Regular or Extra Strength Naprosyn Uracel  Atromid-S Efficin Naproxen Ursinus  Auranofin Capsules Elmiron Neocylate Vanquish  Axotal Emagrin Norgesic Verin  Azathioprine Empirin or Empirin with Codeine Normiflo Vitamin E  Azolid Emprazil Nuprin Voltaren  Bayer Aspirin plain, buffered or children's or timed BC Tablets or powders Encaprin Orgaran Warfarin Sodium  Buff-a-Comp Enoxaparin Orudis Zorpin  Buff-a-Comp with Codeine Equegesic Os-Cal-Gesic   Buffaprin Excedrin plain, buffered or Extra Strength Oxalid   Bufferin Arthritis Strength Feldene Oxphenbutazone   Bufferin plain or Extra Strength Feldene Capsules Oxycodone with Aspirin   Bufferin with Codeine Fenoprofen Fenoprofen Pabalate or Pabalate-SF   Buffets II Flogesic Panagesic   Buffinol plain or Extra Strength Florinal or Florinal with Codeine Panwarfarin   Buf-Tabs Flurbiprofen Penicillamine   Butalbital Compound Four-way cold tablets Penicillin   Butazolidin Fragmin Pepto-Bismol   Carbenicillin Geminisyn Percodan   Carna Arthritis Reliever Geopen Persantine   Carprofen Gold's salt Persistin   Chloramphenicol Goody's Phenylbutazone   Chloromycetin Haltrain Piroxlcam   Clmetidine heparin Plaquenil   Cllnoril Hyco-pap Ponstel   Clofibrate Hydroxy chloroquine Propoxyphen         Before stopping any of these medications, be sure to consult the physician who ordered them.   Some, such as Coumadin (Warfarin) are ordered to prevent or treat serious conditions such as "deep thrombosis", "pumonary embolisms", and other heart problems.  The amount of time that you may need off of the medication may also vary with the medication and the reason for which you were taking it.  If you are taking any of these medications, please make sure you notify your pain physician before you undergo any procedures.         Radiofrequency Ablation Radiofrequency ablation is a procedure that is performed to relieve pain. The  procedure is often used for back, neck, or arm pain. Radiofrequency ablation involves the use of a machine that creates radio waves to make heat. During the procedure, the heat is applied to the nerve that carries the pain signal. The heat damages the nerve and interferes with the pain signal. Pain relief usually starts about 2 weeks after the procedure and lasts for 6 months to 1 year. Tell a health care provider about: Any allergies you have. All medicines you are taking, including vitamins, herbs, eye drops, creams, and over-the-counter medicines. Any problems you or family members have had with anesthetic medicines. Any bleeding problems you have. Any surgeries you have had. Any medical conditions you have. Whether you are pregnant or may be pregnant. What are the risks? Generally, this is a safe procedure. However, problems may occur, including: Pain or soreness at the injection site. Allergic reaction to medicines given during the procedure. Bleeding. Infection at the injection site. Damage to nerves or blood vessels. What happens before the procedure? When to stop eating and drinking Follow instructions from your health care provider about what you may eat and drink before your procedure. These may include: 8 hours before the procedure Stop eating most foods. Do not eat meat, fried foods, or fatty foods. Eat only light foods, such as toast or  crackers. All liquids are okay except energy drinks and alcohol. 6 hours before the procedure Stop eating. Drink only clear liquids, such as water, clear fruit juice, black coffee, plain tea, and sports drinks. Do not drink energy drinks or alcohol. 2 hours before the procedure Stop drinking all liquids. You may be allowed to take medicine with small sips of water. If you do not follow your health care provider's instructions, your procedure may be delayed or canceled. Medicines Ask your health care provider about: Changing or stopping your regular medicines. This is especially important if you are taking diabetes medicines or blood thinners. Taking medicines such as aspirin and ibuprofen. These medicines can thin your blood. Do not take these medicines unless your health care provider tells you to take them. Taking over-the-counter medicines, vitamins, herbs, and supplements. General instructions Ask your health care provider what steps will be taken to help prevent infection. These steps may include: Removing hair at the procedure site. Washing skin with a germ-killing soap. Taking antibiotic medicine. If you will be going home right after the procedure, plan to have a responsible adult: Take you home from the hospital or clinic. You will not be allowed to drive. Care for you for the time you are told. What happens during the procedure?  You will be awake during the procedure. You will need to be able to talk with the health care provider during the procedure. An IV will be inserted into one of your veins. You will be given one or more of the following: A medicine to help you relax (sedative). A medicine to numb the area (local anesthetic). Your health care provider will insert a radiofrequency needle into the area to be treated. This is done with the help of fluoroscopy. A wire that carries the radio waves (electrode) will be put through the radiofrequency needle. An electrical  pulse will be sent through the electrode to verify the correct nerve that is causing your pain. You will feel a tingling sensation, and you may have muscle twitching. The tissue around the needle tip will be heated by an electric current that comes from the radiofrequency machine. This will numb the nerves.  The needle will be removed. A bandage (dressing) will be put on the insertion area. The procedure may vary among health care providers and hospitals. What happens after the procedure? Your blood pressure, heart rate, breathing rate, and blood oxygen level will be monitored until you leave the hospital or clinic. Return to your normal activities as told by your health care provider. Ask your health care provider what activities are safe for you. If you were given a sedative during the procedure, it can affect you for several hours. Do not drive or operate machinery until your health care provider says that it is safe. Summary Radiofrequency ablation is a procedure that is performed to relieve pain. The procedure is often used for back, neck, or arm pain. Radiofrequency ablation involves the use of a machine that creates radio waves to make heat. Plan to have a responsible adult take you home from the hospital or clinic. Do not drive or operate machinery until your health care provider says that it is safe. Return to your normal activities as told by your health care provider. Ask your health care provider what activities are safe for you. This information is not intended to replace advice given to you by your health care provider. Make sure you discuss any questions you have with your health care provider. Document Revised: 01/10/2021 Document Reviewed: 01/10/2021 Elsevier Patient Education  2024 [employer].

## 2023-02-20 ENCOUNTER — Ambulatory Visit
Payer: No Typology Code available for payment source | Admitting: Student in an Organized Health Care Education/Training Program

## 2023-02-20 ENCOUNTER — Encounter: Payer: No Typology Code available for payment source | Admitting: Dermatology

## 2023-03-13 ENCOUNTER — Ambulatory Visit
Admission: RE | Admit: 2023-03-13 | Discharge: 2023-03-13 | Disposition: A | Discharge status: Home / Self Care | Payer: No Typology Code available for payment source | Source: Ambulatory Visit | Attending: Student in an Organized Health Care Education/Training Program | Admitting: Student in an Organized Health Care Education/Training Program

## 2023-03-13 ENCOUNTER — Ambulatory Visit
Payer: No Typology Code available for payment source | Attending: Student in an Organized Health Care Education/Training Program | Admitting: Student in an Organized Health Care Education/Training Program

## 2023-03-13 ENCOUNTER — Encounter: Payer: Self-pay | Admitting: Student in an Organized Health Care Education/Training Program

## 2023-03-13 VITALS — BP 108/70 | HR 76 | Temp 98.3°F | Resp 14 | Ht 70.0 in | Wt 255.0 lb

## 2023-03-13 DIAGNOSIS — G894 Chronic pain syndrome: Secondary | ICD-10-CM | POA: Insufficient documentation

## 2023-03-13 DIAGNOSIS — M47816 Spondylosis without myelopathy or radiculopathy, lumbar region: Secondary | ICD-10-CM | POA: Diagnosis present

## 2023-03-13 MED ORDER — MIDAZOLAM HCL 2 MG/2ML IJ SOLN
0.5000 mg | Freq: Once | INTRAMUSCULAR | Status: AC
  Administered 2023-03-13: 2 mg via INTRAVENOUS

## 2023-03-13 MED ORDER — LIDOCAINE HCL 2 % IJ SOLN
INTRAMUSCULAR | Status: AC
  Filled 2023-03-13: qty 20

## 2023-03-13 MED ORDER — DEXAMETHASONE SODIUM PHOSPHATE 10 MG/ML IJ SOLN
10.0000 mg | Freq: Once | INTRAMUSCULAR | Status: AC
  Administered 2023-03-13: 10 mg

## 2023-03-13 MED ORDER — ROPIVACAINE HCL 2 MG/ML IJ SOLN
INTRAMUSCULAR | Status: AC
  Filled 2023-03-13: qty 20

## 2023-03-13 MED ORDER — ONDANSETRON HCL 4 MG/2ML IJ SOLN
4.0000 mg | Freq: Once | INTRAMUSCULAR | Status: AC
  Administered 2023-03-13: 4 mg via INTRAVENOUS

## 2023-03-13 MED ORDER — MIDAZOLAM HCL 2 MG/2ML IJ SOLN
INTRAMUSCULAR | Status: AC
  Filled 2023-03-13: qty 2

## 2023-03-13 MED ORDER — ROPIVACAINE HCL 2 MG/ML IJ SOLN
9.0000 mL | Freq: Once | INTRAMUSCULAR | Status: AC
  Administered 2023-03-13: 20 mL via PERINEURAL

## 2023-03-13 MED ORDER — GLYCOPYRROLATE 0.2 MG/ML IJ SOLN
0.2000 mg | Freq: Once | INTRAMUSCULAR | Status: AC
  Administered 2023-03-13: 0.2 mg via INTRAVENOUS

## 2023-03-13 MED ORDER — DEXAMETHASONE SODIUM PHOSPHATE 10 MG/ML IJ SOLN
INTRAMUSCULAR | Status: AC
  Filled 2023-03-13: qty 2

## 2023-03-13 MED ORDER — LIDOCAINE HCL 2 % IJ SOLN
20.0000 mL | Freq: Once | INTRAMUSCULAR | Status: AC
  Administered 2023-03-13: 400 mg

## 2023-03-13 MED ORDER — GLYCOPYRROLATE 0.2 MG/ML IJ SOLN
INTRAMUSCULAR | Status: AC
  Filled 2023-03-13: qty 1

## 2023-03-13 MED ORDER — LACTATED RINGERS IV SOLN: Freq: Once | INTRAVENOUS | Status: AC

## 2023-03-13 MED ORDER — ONDANSETRON HCL 4 MG/2ML IJ SOLN
INTRAMUSCULAR | Status: AC
  Filled 2023-03-13: qty 2

## 2023-03-13 NOTE — Patient Instructions (Signed)

## 2023-03-13 NOTE — Addendum Note (Signed)
Addended by: Concepcion Elk on: 03/13/2023 12:32 PM   Modules accepted: Orders

## 2023-03-13 NOTE — Progress Notes (Signed)
Patient's Name: [name]  MRN: [id number]  Referring Provider: Lynnea Ferrier, MD  DOB: 09/04/1965  PCP: Lynnea Ferrier, MD  DOS: 03/13/2023  Note by: Edward Jolly, MD  Service setting: Ambulatory outpatient  Specialty: Interventional Pain Management  Patient type: Established  Location: [facility] (AMB) Pain Management Facility  Visit type: Interventional Procedure   Primary Reason for Visit: Interventional Pain Management Treatment. CC: Back Pain   Procedure:          Anesthesia, Analgesia, Anxiolysis:  Type: Thermal Lumbar Facet, Medial Branch Radiofrequency Ablation/Neurotomy  #6 Primary Purpose: Therapeutic Region: Posterolateral Lumbosacral Spine Level:  L3, L4, L5, Medial Branch Level(s). These levels will denervate the L3-4, L4-5,lumbar facet joints. Laterality:  Bilateral  Type: Local Anesthesia  Local Anesthetic: Lidocaine 1-2% Anxiolysis: IV Versed 2 mg Position: Prone   Indications: 1. Lumbar facet arthropathy   2. Lumbar spondylosis   3. Chronic pain syndrome     Mr. Perkinson has been dealing with the above chronic pain for longer than three months and has either failed to respond, was unable to tolerate, or simply did not get enough benefit from other more conservative therapies including, but not limited to: 1. Over-the-counter medications 2. Anti-inflammatory medications 3. Muscle relaxants 4. Membrane stabilizers 5. Opioids 6. Physical therapy and/or chiropractic manipulation 7. Modalities (Heat, ice, etc.) 8. Invasive techniques such as nerve blocks. Mr. Stansberry has attained more than 50% relief of the pain from a series of diagnostic injections conducted in separate occasions.  Pain Score: Pre-procedure: 4 /10 Post-procedure: 0-No pain/10  Stopped Xarelto 3 days ago  Pre-op Assessment:  Mr. Sorden is a 57 y.o. (year old), male patient, seen today for interventional treatment. He  has a past surgical history that includes Cardioversion (1989);  Cardiac catheterization (03/2010); and Total knee arthroplasty (Left). Mr. Deily has a current medication list which includes the following prescription(s): allopurinol, b complex vitamins, carvedilol, co q-10, cyclobenzaprine, docusate sodium, empagliflozin, entresto, fluoxetine hcl, ibuprofen, icosapent ethyl, levothyroxine, meclizine, melatonin, mirtazapine, multivitamin, mupirocin ointment, niacinamide er, rosuvastatin, theratears, turmeric, ascorbic acid, fenofibrate, heliocare, rivaroxaban, and spironolactone. His primarily concern today is the Back Pain   Initial Vital Signs:  Pulse/HCG Rate: 76ECG Heart Rate: 70 Temp: 98.3 F (36.8 C) Resp: 16 BP: 99/68 SpO2: 97 %  BMI: Estimated body mass index is 36.59 kg/m as calculated from the following:   Height as of this encounter: 5\' 10"  (1.778 m).   Weight as of this encounter: 255 lb (115.7 kg).  Risk Assessment: Allergies: Reviewed. He has No Known Allergies.  Allergy Precautions: None required Coagulopathies: Reviewed. None identified.  Blood-thinner therapy: None at this time Active Infection(s): Reviewed. None identified. Mr. Gaines is afebrile  Site Confirmation: Mr. Nazari was asked to confirm the procedure and laterality before marking the site Procedure checklist: Completed Consent: Before the procedure and under the influence of no sedative(s), amnesic(s), or anxiolytics, the patient was informed of the treatment options, risks and possible complications. To fulfill our ethical and legal obligations, as recommended by the American Medical Association's Code of Ethics, I have informed the patient of my clinical impression; the nature and purpose of the treatment or procedure; the risks, benefits, and possible complications of the intervention; the alternatives, including doing nothing; the risk(s) and benefit(s) of the alternative treatment(s) or procedure(s); and the risk(s) and benefit(s) of doing nothing. The patient was  provided information about the general risks and possible complications associated with the procedure. These may include, but are  not limited to: failure to achieve desired goals, infection, bleeding, organ or nerve damage, allergic reactions, paralysis, and death. In addition, the patient was informed of those risks and complications associated to Spine-related procedures, such as failure to decrease pain; infection (i.e.: Meningitis, epidural or intraspinal abscess); bleeding (i.e.: epidural hematoma, subarachnoid hemorrhage, or any other type of intraspinal or peri-dural bleeding); organ or nerve damage (i.e.: Any type of peripheral nerve, nerve root, or spinal cord injury) with subsequent damage to sensory, motor, and/or autonomic systems, resulting in permanent pain, numbness, and/or weakness of one or several areas of the body; allergic reactions; (i.e.: anaphylactic reaction); and/or death. Furthermore, the patient was informed of those risks and complications associated with the medications. These include, but are not limited to: allergic reactions (i.e.: anaphylactic or anaphylactoid reaction(s)); adrenal axis suppression; blood sugar elevation that in diabetics may result in ketoacidosis or comma; water retention that in patients with history of congestive heart failure may result in shortness of breath, pulmonary edema, and decompensation with resultant heart failure; weight gain; swelling or edema; medication-induced neural toxicity; particulate matter embolism and blood vessel occlusion with resultant organ, and/or nervous system infarction; and/or aseptic necrosis of one or more joints. Finally, the patient was informed that Medicine is not an exact science; therefore, there is also the possibility of unforeseen or unpredictable risks and/or possible complications that may result in a catastrophic outcome. The patient indicated having understood very clearly. We have given the patient no guarantees  and we have made no promises. Enough time was given to the patient to ask questions, all of which were answered to the patient's satisfaction. Mr. Bucio has indicated that he wanted to continue with the procedure. Attestation: I, the ordering provider, attest that I have discussed with the patient the benefits, risks, side-effects, alternatives, likelihood of achieving goals, and potential problems during recovery for the procedure that I have provided informed consent. Date  Time:   Pre-Procedure Preparation:  Monitoring: As per clinic protocol. Respiration, ETCO2, SpO2, BP, heart rate and rhythm monitor placed and checked for adequate function Safety Precautions: Patient was assessed for positional comfort and pressure points before starting the procedure. Time-out: I initiated and conducted the "Time-out" before starting the procedure, as per protocol. The patient was asked to participate by confirming the accuracy of the "Time Out" information. Verification of the correct person, site, and procedure were performed and confirmed by me, the nursing staff, and the patient. "Time-out" conducted as per Joint Commission's Universal Protocol (UP.01.01.01). Time: 1610  Description of Procedure:          Laterality: Right & LEFT (Bilateral) Levels: L3, L4, L5,Medial Branch Level(s), at the L3-4, L4-5,  lumbar facet joints. Area Prepped: Lumbosacral Prepping solution: ChloraPrep (2% chlorhexidine gluconate and 70% isopropyl alcohol) Safety Precautions: Aspiration looking for blood return was conducted prior to all injections. At no point did we inject any substances, as a needle was being advanced. Before injecting, the patient was told to immediately notify me if he was experiencing any new onset of "ringing in the ears, or metallic taste in the mouth". No attempts were made at seeking any paresthesias. Safe injection practices and needle disposal techniques used. Medications properly checked for  expiration dates. SDV (single dose vial) medications used. After the completion of the procedure, all disposable equipment used was discarded in the proper designated medical waste containers. Local Anesthesia: Protocol guidelines were followed. The patient was positioned over the fluoroscopy table. The area was prepped in the usual  manner. The time-out was completed. The target area was identified using fluoroscopy. A 12-in long, straight, sterile hemostat was used with fluoroscopic guidance to locate the targets for each level blocked. Once located, the skin was marked with an approved surgical skin marker. Once all sites were marked, the skin (epidermis, dermis, and hypodermis), as well as deeper tissues (fat, connective tissue and muscle) were infiltrated with a small amount of a short-acting local anesthetic, loaded on a 10cc syringe with a 25G, 1.5-in  Needle. An appropriate amount of time was allowed for local anesthetics to take effect before proceeding to the next step. Local Anesthetic: Lidocaine 2.0% The unused portion of the local anesthetic was discarded in the proper designated containers. Technical explanation of process:  Radiofrequency Ablation (RFA)  L3 Medial Branch Nerve RFA: The target area for the L3 medial branch is at the junction of the postero-lateral aspect of the superior articular process and the superior, posterior, and medial edge of the transverse process of L4. Under fluoroscopic guidance, a Radiofrequency needle was inserted until contact was made with os over the superior postero-lateral aspect of the pedicular shadow (target area). Sensory and motor testing was conducted to properly adjust the position of the needle. Once satisfactory placement of the needle was achieved, the numbing solution was slowly injected after negative aspiration for blood. 2mL of the nerve block solution was injected without difficulty or complication. After waiting for at least 3 minutes, the  ablation was performed. Once completed, the needle was removed intact. L4 Medial Branch Nerve RFA: The target area for the L4 medial branch is at the junction of the postero-lateral aspect of the superior articular process and the superior, posterior, and medial edge of the transverse process of L5. Under fluoroscopic guidance, a Radiofrequency needle was inserted until contact was made with os over the superior postero-lateral aspect of the pedicular shadow (target area). Sensory and motor testing was conducted to properly adjust the position of the needle. Once satisfactory placement of the needle was achieved, the numbing solution was slowly injected after negative aspiration for blood. 2mL of the nerve block solution was injected without difficulty or complication. After waiting for at least 3 minutes, the ablation was performed. Once completed, the needle was removed intact. L5 Medial Branch Nerve RFA: The target area for the L5 medial branch is at the junction of the postero-lateral aspect of the superior articular process of S1 and the superior, posterior, and medial edge of the sacral ala. Under fluoroscopic guidance, a Radiofrequency needle was inserted until contact was made with os over the superior postero-lateral aspect of the pedicular shadow (target area). Sensory and motor testing was conducted to properly adjust the position of the needle. Once satisfactory placement of the needle was achieved, the numbing solution was slowly injected after negative aspiration for blood.2 mL of the nerve block solution was injected without difficulty or complication. After waiting for at least 3 minutes, the ablation was performed. Once completed, the needle was removed intact.   Radiofrequency lesioning (ablation):  Radiofrequency Generator: NeuroTherm [id number] Sensory Stimulation Parameters: 50 Hz was used to locate & identify the nerve, making sure that the needle was positioned such that there was no  sensory stimulation below 0.3 V or above 0.7 V. Motor Stimulation Parameters: 2 Hz was used to evaluate the motor component. Care was taken not to lesion any nerves that demonstrated motor stimulation of the lower extremities at an output of less than 2.5 times that of the sensory threshold,  or a maximum of 2.0 V. Lesioning Technique Parameters: Standard Radiofrequency settings. (Not bipolar or pulsed.) Temperature Settings: 80 degrees C Lesioning time: 60 seconds Intra-operative Compliance: Compliant Materials & Medications: Needle(s) (Electrode/Cannula) Type: Teflon-coated, curved tip, Radiofrequency needle(s) Gauge: 22G Length: 10cm Numbing solution: 12 cc solution made of 10cc of 0.2% ropivacaine, 2 cc of Decadron 10 mg/cc.  2 cc injected at each level above on the right& left prior to ablation.  The unused portion of the solution was discarded in the proper designated containers.  Once the entire procedure was completed, the treated area was cleaned, making sure to leave some of the prepping solution back to take advantage of its long term bactericidal properties.  Illustration of the posterior view of the lumbar spine and the posterior neural structures. Laminae of L2 through S1 are labeled. DPRL5, dorsal primary ramus of L5; DPRS1, dorsal primary ramus of S1; DPR3, dorsal primary ramus of L3; FJ, facet (zygapophyseal) joint L3-L4; I, inferior articular process of L4; LB1, lateral branch of dorsal primary ramus of L1; IAB, inferior articular branches from L3 medial branch (supplies L4-L5 facet joint); IBP, intermediate branch plexus; MB3, medial branch of dorsal primary ramus of L3; NR3, third lumbar nerve root; S, superior articular process of L5; SAB, superior articular branches from L4 (supplies L4-5 facet joint also); TP3, transverse process of L3.  Vitals:   03/13/23 0918 03/13/23 0926 03/13/23 0932 03/13/23 0939  BP: (!) 101/42 (!) 99/59 (!) 111/53 108/70  Pulse:      Resp: 14      Temp:      TempSrc:      SpO2: 96% 94% 94% 97%  Weight:      Height:        Start Time: 0852 hrs. End Time: 0918 hrs.  Imaging Guidance (Spinal):          Type of Imaging Technique: Fluoroscopy Guidance (Spinal) Indication(s): Assistance in needle guidance and placement for procedures requiring needle placement in or near specific anatomical locations not easily accessible without such assistance. Exposure Time: Please see nurses notes. Contrast: None used. Fluoroscopic Guidance: I was personally present during the use of fluoroscopy. "Tunnel Vision Technique" used to obtain the best possible view of the target area. Parallax error corrected before commencing the procedure. "Direction-depth-direction" technique used to introduce the needle under continuous pulsed fluoroscopy. Once target was reached, antero-posterior, oblique, and lateral fluoroscopic projection used confirm needle placement in all planes. Images permanently stored in EMR. Interpretation: No contrast injected. I personally interpreted the imaging intraoperatively. Adequate needle placement confirmed in multiple planes. Permanent images saved into the patient's record.   Post-operative Assessment:  Post-procedure Vital Signs:  Pulse/HCG Rate: 7671 Temp: 98.3 F (36.8 C) Resp: 14 BP: 108/70 SpO2: 97 %  EBL: None  Complications: No immediate post-treatment complications observed by team, or reported by patient.  Note:  Patient had a slight vasovagal reaction towards the end with decreased BP and HP, given glycopyrolate 0.2 mg, sx improved. Otherwise,  The patient tolerated the entire procedure well. A repeat set of vitals were taken after the procedure and the patient was kept under observation following institutional policy, for this type of procedure. Post-procedural neurological assessment was performed, showing return to baseline, prior to discharge. The patient was provided with post-procedure discharge  instructions, including a section on how to identify potential problems. Should any problems arise concerning this procedure, the patient was given instructions to immediately contact us, at any time, without hesitation. In any case,  we plan to contact the patient by telephone for a follow-up status report regarding this interventional procedure.  Comments:  No additional relevant information.  Plan of Care  Orders:  Orders Placed This Encounter  Procedures   DG PAIN CLINIC C-ARM 1-60 MIN NO REPORT    Intraoperative interpretation by procedural physician at [facility].    Standing Status:   Standing    Number of Occurrences:   1    Order Specific Question:   Reason for exam:    Answer:   Assistance in needle guidance and placement for procedures requiring needle placement in or near specific anatomical locations not easily accessible without such assistance.    Patient instructed to restart Xarelto tomorrow so long as he is not having any LE weakness.    Medications ordered for procedure: Meds ordered this encounter  Medications   lidocaine (XYLOCAINE) 2 % (with pres) injection 400 mg   lactated ringers infusion   midazolam (VERSED) injection 0.5-2 mg    Make sure Flumazenil is available in the pyxis when using this medication. If oversedation occurs, administer 0.2 mg IV over 15 sec. If after 45 sec no response, administer 0.2 mg again over 1 min; may repeat at 1 min intervals; not to exceed 4 doses (1 mg)   dexamethasone (DECADRON) injection 10 mg   dexamethasone (DECADRON) injection 10 mg   ropivacaine (PF) 2 mg/mL (0.2%) (NAROPIN) injection 9 mL   ropivacaine (PF) 2 mg/mL (0.2%) (NAROPIN) injection 9 mL    Medications administered: We administered lidocaine, lactated ringers, midazolam, dexamethasone, dexamethasone, ropivacaine (PF) 2 mg/mL (0.2%), and ropivacaine (PF) 2 mg/mL (0.2%).  See the medical record for exact dosing, route, and time of  administration.  Disposition: Discharge home  Discharge Date & Time: 03/13/2023; 0941 hrs.   Follow-up plan:   Return in about 6 weeks (around 04/24/2023) for Post Procedure Evaluation, virtual.     Future Appointments  Date Time Provider Department Center  04/01/2023  3:00 PM Elie Goody, MD [facility] None  04/24/2023  3:00 PM Edward Jolly, MD [facility] None   Primary Care Physician: Lynnea Ferrier, MD Location: [facility] Note by: Edward Jolly, MD Date: 03/13/2023; Time: 9:57 AM  Disclaimer:  Medicine is not an exact science. The only guarantee in medicine is that nothing is guaranteed. It is important to note that the decision to proceed with this intervention was based on the information collected from the patient. The Data and conclusions were drawn from the patient's questionnaire, the interview, and the physical examination. Because the information was provided in large part by the patient, it cannot be guaranteed that it has not been purposely or unconsciously manipulated. Every effort has been made to obtain as much relevant data as possible for this evaluation. It is important to note that the conclusions that lead to this procedure are derived in large part from the available data. Always take into account that the treatment will also be dependent on availability of resources and existing treatment guidelines, considered by other Pain Management Practitioners as being common knowledge and practice, at the time of the intervention. For Medico-Legal purposes, it is also important to point out that variation in procedural techniques and pharmacological choices are the acceptable norm. The indications, contraindications, technique, and results of the above procedure should only be interpreted and judged by a Board-Certified Interventional Pain Specialist with extensive familiarity and expertise in the same exact procedure and technique.

## 2023-03-14 ENCOUNTER — Telehealth: Payer: Self-pay

## 2023-03-14 NOTE — Telephone Encounter (Signed)
Post procedure follow up.  LM 

## 2023-03-18 ENCOUNTER — Encounter: Payer: Self-pay | Admitting: Internal Medicine

## 2023-03-20 NOTE — Progress Notes (Unsigned)
Cardiology Office Note Date:  03/21/2023  Patient ID:  [name] [birth year], MRN [id number] PCP:  Lynnea Ferrier, MD  Cardiologist:  Sherryl Manges, MD Electrophysiologist: Sherryl Manges, MD    Chief Complaint: fatigue  History of Present Illness: [name] is a 57 y.o. male with PMH notable for persis AFib, PVCs, HFmrEF, NICM, HTN; seen today for Sherryl Manges, MD for routine electrophysiology followup.  He last saw Dr. Graciela Husbands 05/2022, was doing well without afib episodes and tolerating GDMT well.  He saw PCP earlier this month, spiro stopped d/t concern for hypotension. Zepbound started for weight loss He messaged clinic earlier this week with c/o reduced energy, getting winded quicker with walking and increased sweating.   On follow-up today, he says that he has noticed for about the last month that he is much more fatigued than he used to be. He goes for long walks every other day, used to be able to tolerate them well and now feels completely wiped out afterwards. He does not sleep well at night, often feels like he could go right back to bed in the morning. Drinks high amount of coffee throughout the day. He has started to check his BP more regularly since his increased fatigue, all readings 100-110; never 120s systolic. He does continue to have palpitation episodes, fluttering and "flip flopping" in chest. Episodes are brief, usually no more than a few minutes. He has noticed that if he drinks 3-4 alcoholic beverages, will have more fluttering.  [facility] several [facility] changes over the last few months: reduce coreg 25 > 12.5; reduce jardiance 25 > 12.5; increase synthroid 200 > 224.   AAD History: none  Past Medical History:  Diagnosis Date   Anxiety    Arthritis    gout   Atrial fibrillation (HCC) 1989   Basal cell carcinoma 06/23/2020   Upper mid back. Nodular.   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-no seizure since 1998    Hyperlipidemia    Hypertension    Seizures (HCC) 1998   with heat stroke in the [employer] -none since   throat cancer     Past Surgical History:  Procedure Laterality Date   CARDIAC CATHETERIZATION  03/2010   No sig coronary disease, EF ~40%   CARDIOVERSION  1989   TOTAL KNEE ARTHROPLASTY Left     Current Outpatient Medications  Medication Instructions   ALLOPURINOL PO 400 mg, Oral, Daily   ascorbic acid (VITAMIN C) 500 mg, Oral, Daily   b complex vitamins capsule 1 capsule, Oral, Daily   Coenzyme Q10 (CO Q-10) 100 MG CAPS 1 capsule, Oral, Daily   cyclobenzaprine (FLEXERIL) 10 MG tablet Oral, Daily   Docusate Sodium 100 mg, Oral, Daily   empagliflozin (JARDIANCE) 12.5 mg, Oral, Daily   ENTRESTO 97-103 MG 1 tablet, Oral, 2 times daily   fenofibrate 160 MG tablet 1 tablet, Oral, Daily   FLUOXETINE HCL PO 80 mg, Oral, Daily   Heliocare 240 mg, Oral, Every evening   Ibuprofen 800  mg, Oral, 2 times daily PRN   icosapent Ethyl (VASCEPA) 4 g, Oral, 2 times daily   LEVOTHYROXINE SODIUM PO 224 mcg, Oral, Daily   meclizine (ANTIVERT) 25 mg, Oral, 3 times daily PRN   Melatonin 20 mg, Oral, Daily at bedtime, Takes 2 capsules po at bedtime for sleep.    metoprolol succinate (TOPROL-XL) 50 mg, Oral, Nightly, Take with or immediately following a meal.   mirtazapine (REMERON) 30 mg, Oral, Daily   Multiple Vitamin (MULTIVITAMIN) tablet 1 tablet, Oral, Daily   mupirocin ointment (BACTROBAN) 2 % 1 application , Topical, Daily   Niacinamide 500 MG TBCR 1 tablet, Oral, 2 times daily, For skin cancer prevention.   rivaroxaban (XARELTO) 20  mg, Oral, Daily   rosuvastatin (CRESTOR) 40 mg, Oral, Daily   spironolactone (ALDACTONE) 25 mg, Oral, Daily   THERATEARS 0.25 % SOLN 1 drop, Both Eyes, As needed   Turmeric 500 MG TABS Oral, Daily    Social History:  The patient  reports that he has never smoked. He has never used smokeless tobacco. He reports current alcohol use of about 2.0 - 3.0 standard drinks of alcohol per week. He reports that he does not use drugs.   Family History:  The patient's family history includes Arthritis in his mother; COPD in his mother; Coronary artery disease in his paternal grandfather; Crohn's disease in his brother; Depression in his mother; Diabetes in his mother; Heart attack in his father; Lung cancer in his maternal grandfather, paternal uncle, and paternal uncle; Pancreatic cancer in his paternal uncle; Thyroid cancer in his paternal aunt.  ROS:  Please see the history of present illness. All other systems are reviewed and otherwise negative.   PHYSICAL EXAM:  VS:  BP 110/60 (BP Location: Left Arm, Patient Position: Sitting, Cuff Size: Normal)   Pulse 67   Ht 5\' 10"  (1.778 m)   Wt 255 lb (115.7 kg)   BMI 36.59 kg/m  BMI: Body mass index is 36.59 kg/m.  Orthostatic VS for the past 24 hrs (Last 3 readings):  BP- Lying Pulse- Lying BP- Sitting Pulse- Sitting BP- Standing at 0 minutes Pulse- Standing at 0 minutes BP- Standing at 3 minutes Pulse- Standing at 3 minutes  03/21/23 1209 106/70 64 108/66 76 115/71 74 121/77 78    GEN- The patient is well appearing, alert and oriented x 3 today.   Lungs- Clear to ausculation bilaterally, normal work of breathing.  Heart- Regular w freq ectopic beats, no murmurs, rubs or gallops Extremities- No peripheral edema, warm, dry   EKG is ordered. Personal review of EKG from today shows:    EKG Interpretation Date/Time:  Thursday March 21 2023 11:30:26 EDT Ventricular Rate:  67 PR Interval:  198 QRS Duration:  84 QT Interval:  402 QTC  Calculation: 424 R Axis:   -18  Text Interpretation: Sinus rhythm with occasional Premature ventricular complexes Confirmed by Sherie Don [phone]) on 03/21/2023 11:33:26 AM    Recent Labs: No results found for requested labs within last 365 days.  No results found for requested labs within last 365 days.   CrCl cannot be calculated (Patient's most recent lab result is older than the maximum 21 days allowed.).   Wt Readings from Last 3 Encounters:  03/21/23 255 lb (115.7 kg)  03/13/23 255 lb (115.7 kg)  01/23/23 250 lb (113.4 kg)     Additional studies reviewed include: Previous EP, cardiology notes.   [facility]  TTE, 09/02/2020 1. Left ventricular ejection fraction, by estimation, is 30 to 35%. The left ventricle has moderately decreased function. The left ventricle demonstrates global hypokinesis. Grossly, unable to exclude regional wall motion abnormality. The left ventricular internal cavity size was mildly to moderately dilated.   2. Right ventricular systolic function is normal. The right ventricular size is normal. Tricuspid regurgitation signal is inadequate for assessing PA pressure.   3. Left atrial size was moderately dilated.   4. The mitral valve is normal in structure. Mild mitral valve regurgitation.   5. The inferior vena cava is dilated in size with >50% respiratory variability, suggesting right atrial pressure of 8 mmHg.   6. Challenging images. Definity used.  ASSESSMENT AND PLAN:  #) persis AFib #) PVC Unclear afib, PVC burden  PVC burden appear increased based on today's EKG and exam - 2 week zio to eval Adjust BB from coreg > toprol 50mg  nightly to allow BP to rise  #) Hypercoag d/t persis afib CHA2DS2-VASc Score = 2 [CHF History: 1, HTN History: 1, Diabetes History: 0, Stroke History: 0, Vascular Disease History: 0, Age Score: 0, Gender Score: 0].  Therefore, the patient's annual risk of stroke is 2.2 %.   Stroke ppx - 20mg  xarelto daily, appropriately dosed for CrCl > 50 No bleeding concerns  #) hypothyroid Euthyroid on 02/2023 labs - will update thyroid labs to confirm he remains euthyroid given increased fatigue with the last month  #) HFimpEF (though still mid-range) Most recent echo 2022 with improved LVEF 44% GDMT: spiro, entresto 97-103, jardiance, coreg BB adjustment as above Consider reducing entresto dosage if BP does to rise / fatigue symptoms persist Update echo  #) daytime somnolence Recommended sleep hygiene - reduced caffeine consumption during the afternoons, physically active during the day, sleep in a dark and cool room Watchpat study to eval for OSA        Current medicines are reviewed at length with the patient today.   The patient has concerns regarding his medicines.  The following changes were made today:   STOP coreg START toprol 50mg  nightly  Labs/ tests ordered today include:  Orders Placed This Encounter  Procedures   CBC   TSH   T4, free   Basic Metabolic Panel (BMET)   LONG TERM MONITOR (3-14 DAYS)   EKG 12-Lead   ECHOCARDIOGRAM COMPLETE   [place] Sleep Study     Disposition: Follow up with Dr. Graciela Husbands or EP APP  4-6 weeks    Signed, Sherie Don, NP  03/21/23  1:49 PM  Electrophysiology [facility]

## 2023-03-21 ENCOUNTER — Ambulatory Visit: Payer: BC Managed Care – PPO

## 2023-03-21 ENCOUNTER — Encounter: Payer: Self-pay | Admitting: Cardiology

## 2023-03-21 ENCOUNTER — Ambulatory Visit: Payer: BC Managed Care – PPO | Attending: Cardiology | Admitting: Cardiology

## 2023-03-21 VITALS — BP 110/60 | HR 67 | Ht 70.0 in | Wt 255.0 lb

## 2023-03-21 DIAGNOSIS — I4819 Other persistent atrial fibrillation: Secondary | ICD-10-CM | POA: Diagnosis not present

## 2023-03-21 DIAGNOSIS — I5022 Chronic systolic (congestive) heart failure: Secondary | ICD-10-CM

## 2023-03-21 DIAGNOSIS — I493 Ventricular premature depolarization: Secondary | ICD-10-CM | POA: Diagnosis not present

## 2023-03-21 DIAGNOSIS — I428 Other cardiomyopathies: Secondary | ICD-10-CM | POA: Diagnosis not present

## 2023-03-21 DIAGNOSIS — R400 Somnolence: Secondary | ICD-10-CM

## 2023-03-21 DIAGNOSIS — Z7901 Long term (current) use of anticoagulants: Secondary | ICD-10-CM

## 2023-03-21 DIAGNOSIS — E039 Hypothyroidism, unspecified: Secondary | ICD-10-CM

## 2023-03-21 MED ORDER — ROSUVASTATIN CALCIUM 40 MG PO TABS: 40.0000 mg | ORAL_TABLET | Freq: Every day | ORAL | Status: DC

## 2023-03-21 MED ORDER — METOPROLOL SUCCINATE ER 50 MG PO TB24: 50.0000 mg | ORAL_TABLET | Freq: Every evening | ORAL | 3 refills | Status: DC

## 2023-03-21 NOTE — Patient Instructions (Signed)
Medication Instructions:   STOP carvedilol START Metoprolol Succinate - take one tablet ( 50mg ) by mouth nightly.  *If you need a refill on your cardiac medications before your next appointment, please call your pharmacy*   Lab Work:  Your physician recommends you have labs - CBC / TSH / T4 / BMP  If you have labs (blood work) drawn today and your tests are completely normal, you will receive your results only by: MyChart Message (if you have MyChart) OR A paper copy in the mail If you have any lab test that is abnormal or we need to change your treatment, we will call you to review the results.   Testing/Procedures:  Your physician has requested that you have an echocardiogram. Echocardiography is a painless test that uses sound waves to create images of your heart. It provides your doctor with information about the size and shape of your heart and how well your heart's chambers and valves are working. This procedure takes approximately one hour. There are no restrictions for this procedure. Please do NOT wear cologne, perfume, aftershave, or lotions (deodorant is allowed). Please arrive 15 minutes prior to your appointment time.  2 Your physician has recommended that you have a overnight sleep study (watchpat). This test records several body functions during sleep, including: brain activity, eye movement, oxygen and carbon dioxide blood levels, heart rate and rhythm, breathing rate and rhythm, the flow of air through your mouth and nose, snoring, body muscle movements, and chest and belly movement.   - Your device has been given to you today. Please do not open the device until I have reached out to give you a pin number - this will be determined by insurance - If approved please perform your test as soon as possible  3. Your physician has recommended that you wear a Zio monitor.   This monitor is a medical device that records the heart's electrical activity. Doctors most often use  these monitors to diagnose arrhythmias. Arrhythmias are problems with the speed or rhythm of the heartbeat. The monitor is a small device applied to your chest. You can wear one while you do your normal daily activities. While wearing this monitor if you have any symptoms to push the button and record what you felt. Once you have worn this monitor for the period of time provider prescribed (Usually 14 days), you will return the monitor device in the postage paid box. Once it is returned they will download the data collected and provide [place] with a report which the provider will then review and we will call you with those results. Important tips:  Avoid showering during the first 24 hours of wearing the monitor. Avoid excessive sweating to help maximize wear time. Do not submerge the device, no hot tubs, and no swimming pools. Keep any lotions or oils away from the patch. After 24 hours you may shower with the patch on. Take brief showers with your back facing the shower head.  Do not remove patch once it has been placed because that will interrupt data and decrease adhesive wear time. Push the button when you have any symptoms and write down what you were feeling. Once you have completed wearing your monitor, remove and place into box which has postage paid and place in your outgoing mailbox.  If for some reason you have misplaced your box then call our office and we can provide another box and/or mail it off for you.    Follow-Up: At [facility], you and your health needs are our priority.  As part of our continuing mission to provide you with exceptional heart care, we have created designated Provider Care Teams.  These Care Teams include your primary Cardiologist (physician) and Advanced Practice Providers (APPs -  Physician Assistants and Nurse Practitioners) who all work together to provide you with the care you need, when you need it.  We recommend signing up for the patient portal  called "MyChart".  Sign up information is provided on this After Visit Summary.  MyChart is used to connect with patients for Virtual Visits (Telemedicine).  Patients are able to view lab/test results, encounter notes, upcoming appointments, etc.  Non-urgent messages can be sent to your provider as well.   To learn more about what you can do with MyChart, go to [email or web address].    Your next appointment:   4 - 6  week(s)  Provider:   You may see Sherie Don, NP or one of the following Advanced Practice Providers on your designated Care Team:   Nicolasa Ducking, NP Eula Listen, PA-C Cadence Fransico Michael, PA-C Charlsie Quest, NP

## 2023-03-22 LAB — CBC
Hematocrit: 39.9 % (ref 37.5–51.0)
Hemoglobin: 13.1 g/dL (ref 13.0–17.7)
MCH: 30.4 pg (ref 26.6–33.0)
MCHC: 32.8 g/dL (ref 31.5–35.7)
MCV: 93 fL (ref 79–97)
Platelets: 249 10*3/uL (ref 150–450)
RBC: 4.31 x10E6/uL (ref 4.14–5.80)
RDW: 13.3 % (ref 11.6–15.4)
WBC: 5.5 10*3/uL (ref 3.4–10.8)

## 2023-03-22 LAB — BASIC METABOLIC PANEL
BUN/Creatinine Ratio: 20 (ref 9–20)
BUN: 24 mg/dL (ref 6–24)
CO2: 24 mmol/L (ref 20–29)
Calcium: 9.5 mg/dL (ref 8.7–10.2)
Chloride: 100 mmol/L (ref 96–106)
Creatinine, Ser: 1.19 mg/dL (ref 0.76–1.27)
Glucose: 89 mg/dL (ref 70–99)
Potassium: 4.5 mmol/L (ref 3.5–5.2)
Sodium: 139 mmol/L (ref 134–144)
eGFR: 72 mL/min/{1.73_m2} (ref >59)

## 2023-03-22 LAB — TSH: TSH: 1.16 u[IU]/mL (ref 0.450–4.500)

## 2023-03-22 LAB — T4, FREE: Free T4: 1.6 ng/dL (ref 0.82–1.77)

## 2023-03-24 DIAGNOSIS — I4819 Other persistent atrial fibrillation: Secondary | ICD-10-CM

## 2023-03-26 ENCOUNTER — Telehealth: Payer: Self-pay | Admitting: Cardiology

## 2023-03-26 NOTE — Telephone Encounter (Signed)
Left VM advising patient that if he had further questions regarding his lab work after viewing the providers comments on mychart he may call us back.

## 2023-03-26 NOTE — Telephone Encounter (Signed)
  Pt is returning call to get blood result and also if he still need to do his sleep test

## 2023-04-01 ENCOUNTER — Ambulatory Visit: Payer: No Typology Code available for payment source | Admitting: Dermatology

## 2023-04-01 ENCOUNTER — Encounter: Payer: Self-pay | Admitting: Dermatology

## 2023-04-01 ENCOUNTER — Telehealth: Payer: Self-pay

## 2023-04-01 DIAGNOSIS — D045 Carcinoma in situ of skin of trunk: Secondary | ICD-10-CM

## 2023-04-01 DIAGNOSIS — L814 Other melanin hyperpigmentation: Secondary | ICD-10-CM

## 2023-04-01 DIAGNOSIS — D099 Carcinoma in situ, unspecified: Secondary | ICD-10-CM

## 2023-04-01 DIAGNOSIS — C44619 Basal cell carcinoma of skin of left upper limb, including shoulder: Secondary | ICD-10-CM

## 2023-04-01 DIAGNOSIS — Z85828 Personal history of other malignant neoplasm of skin: Secondary | ICD-10-CM

## 2023-04-01 DIAGNOSIS — C44519 Basal cell carcinoma of skin of other part of trunk: Secondary | ICD-10-CM

## 2023-04-01 DIAGNOSIS — L820 Inflamed seborrheic keratosis: Secondary | ICD-10-CM | POA: Diagnosis not present

## 2023-04-01 DIAGNOSIS — L578 Other skin changes due to chronic exposure to nonionizing radiation: Secondary | ICD-10-CM

## 2023-04-01 DIAGNOSIS — Z1283 Encounter for screening for malignant neoplasm of skin: Secondary | ICD-10-CM

## 2023-04-01 DIAGNOSIS — L821 Other seborrheic keratosis: Secondary | ICD-10-CM

## 2023-04-01 DIAGNOSIS — Z872 Personal history of diseases of the skin and subcutaneous tissue: Secondary | ICD-10-CM

## 2023-04-01 DIAGNOSIS — D229 Melanocytic nevi, unspecified: Secondary | ICD-10-CM

## 2023-04-01 DIAGNOSIS — D485 Neoplasm of uncertain behavior of skin: Secondary | ICD-10-CM

## 2023-04-01 DIAGNOSIS — W908XXA Exposure to other nonionizing radiation, initial encounter: Secondary | ICD-10-CM

## 2023-04-01 DIAGNOSIS — D1801 Hemangioma of skin and subcutaneous tissue: Secondary | ICD-10-CM

## 2023-04-01 HISTORY — DX: Carcinoma in situ, unspecified: D09.9

## 2023-04-01 NOTE — Patient Instructions (Addendum)
Wound Care Instructions  Cleanse wound gently with soap and water once a day then pat dry with clean gauze. Apply a thin coat of Petrolatum (petroleum jelly, "Vaseline") over the wound (unless you have an allergy to this). We recommend that you use a new, sterile tube of Vaseline. Do not pick or remove scabs. Do not remove the yellow or white "healing tissue" from the base of the wound.  Cover the wound with fresh, clean, nonstick gauze and secure with paper tape. You may use Band-Aids in place of gauze and tape if the wound is small enough, but would recommend trimming much of the tape off as there is often too much. Sometimes Band-Aids can irritate the skin.  You should call the office for your biopsy report after 1 week if you have not already been contacted.  If you experience any problems, such as abnormal amounts of bleeding, swelling, significant bruising, significant pain, or evidence of infection, please call the office immediately.  FOR ADULT SURGERY PATIENTS: If you need something for pain relief you may take 1 extra strength Tylenol (acetaminophen) AND 2 Ibuprofen (200mg  each) together every 4 hours as needed for pain. (do not take these if you are allergic to them or if you have a reason you should not take them.) Typically, you may only need pain medication for 1 to 3 days.   Melanoma ABCDEs  Melanoma is the most dangerous type of skin cancer, and is the leading cause of death from skin disease.  You are more likely to develop melanoma if you: Have light-colored skin, light-colored eyes, or red or blond hair Spend a lot of time in the sun Tan regularly, either outdoors or in a tanning bed Have had blistering sunburns, especially during childhood Have a close family member who has had a melanoma Have atypical moles or large birthmarks  Early detection of melanoma is key since treatment is typically straightforward and cure rates are extremely high if we catch it early.   The  first sign of melanoma is often a change in a mole or a new dark spot.  The ABCDE system is a way of remembering the signs of melanoma.  A for asymmetry:  The two halves do not match. B for border:  The edges of the growth are irregular. C for color:  A mixture of colors are present instead of an even brown color. D for diameter:  Melanomas are usually (but not always) greater than 6mm - the size of a pencil eraser. E for evolution:  The spot keeps changing in size, shape, and color.  Please check your skin once per month between visits. You can use a small mirror in front and a large mirror behind you to keep an eye on the back side or your body.   If you see any new or changing lesions before your next follow-up, please call to schedule a visit.  Please continue daily skin protection including broad spectrum sunscreen SPF 30+ to sun-exposed areas, reapplying every 2 hours as needed when you're outdoors.    Due to recent changes in healthcare laws, you may see results of your pathology and/or laboratory studies on MyChart before the doctors have had a chance to review them. We understand that in some cases there may be results that are confusing or concerning to you. Please understand that not all results are received at the same time and often the doctors may need to interpret multiple results in order to provide you  with the best plan of care or course of treatment. Therefore, we ask that you please give [place] 2 business days to thoroughly review all your results before contacting the office for clarification. Should we see a critical lab result, you will be contacted sooner.   If You Need Anything After Your Visit  If you have any questions or concerns for your doctor, please call our main line at [phone] and press option 4 to reach your doctor's medical assistant. If no one answers, please leave a voicemail as directed and we will return your call as soon as possible. Messages left after 4  pm will be answered the following business day.   You may also send [place] a message via MyChart. We typically respond to MyChart messages within 1-2 business days.  For prescription refills, please ask your pharmacy to contact our office. Our fax number is [phone].  If you have an urgent issue when the clinic is closed that cannot wait until the next business day, you can page your doctor at the number below.    Please note that while we do our best to be available for urgent issues outside of office hours, we are not available 24/7.   If you have an urgent issue and are unable to reach [place], you may choose to seek medical care at your doctor's office, retail clinic, urgent care center, or emergency room.  If you have a medical emergency, please immediately call 911 or go to the emergency department.  Pager Numbers  - Dr. Gwen Pounds: [phone]  - Dr. Roseanne Reno: [phone]  - Dr. Katrinka Blazing: [phone]   In the event of inclement weather, please call our main line at [phone] for an update on the status of any delays or closures.  Dermatology Medication Tips: Please keep the boxes that topical medications come in in order to help keep track of the instructions about where and how to use these. Pharmacies typically print the medication instructions only on the boxes and not directly on the medication tubes.   If your medication is too expensive, please contact our office at [phone] option 4 or send [place] a message through MyChart.   We are unable to tell what your co-pay for medications will be in advance as this is different depending on your insurance coverage. However, we may be able to find a substitute medication at lower cost or fill out paperwork to get insurance to cover a needed medication.   If a prior authorization is required to get your medication covered by your insurance company, please allow [place] 1-2 business days to complete this process.  Drug prices often vary  depending on where the prescription is filled and some pharmacies may offer cheaper prices.  The website www.goodrx.com contains coupons for medications through different pharmacies. The prices here do not account for what the cost may be with help from insurance (it may be cheaper with your insurance), but the website can give you the price if you did not use any insurance.  - You can print the associated coupon and take it with your prescription to the pharmacy.  - You may also stop by our office during regular business hours and pick up a GoodRx coupon card.  - If you need your prescription sent electronically to a different pharmacy, notify our office through [facility] or by phone at [phone] option 4.     Si Usted Necesita Algo Despus de Su Visita  Tambin puede enviarnos un mensaje a  travs de MyChart. Por lo general respondemos a los mensajes de MyChart en el transcurso de 1 a 2 das hbiles.  Para renovar recetas, por favor pida a su farmacia que se ponga en contacto con nuestra oficina. Annie Sable de fax es [place] [phone].  Si tiene un asunto urgente cuando la clnica est cerrada y que no puede esperar hasta el siguiente da hbil, puede llamar/localizar a su doctor(a) al nmero que aparece a continuacin.   Por favor, tenga en cuenta que aunque hacemos todo lo posible para estar disponibles para asuntos urgentes fuera del horario de Edie, no estamos disponibles las 24 horas del da, los 7 [address]  [address] de la [place].   Si tiene un problema urgente y no puede comunicarse con nosotros, puede optar por buscar atencin mdica  en el consultorio de su doctor(a), en una clnica privada, en un centro de atencin urgente o en una sala de emergencias.  Si tiene Engineer, drilling, por favor llame inmediatamente al 911 o vaya a la sala de emergencias.  Nmeros de bper  - Dr. Gwen Pounds: [phone]  - Dra. Roseanne Reno: [phone]  - Dr. Katrinka Blazing: [phone]   En caso de  inclemencias del tiempo, por favor llame a Lacy Duverney principal al [phone] para una actualizacin sobre el [place] de cualquier retraso o cierre.  Consejos para la medicacin en dermatologa: Por favor, guarde las cajas en las que vienen los medicamentos de uso tpico para ayudarle a seguir las instrucciones sobre dnde y cmo usarlos. Las farmacias generalmente imprimen las instrucciones del medicamento slo en las cajas y no directamente en los tubos del [place].   Si su medicamento es muy caro, por favor, pngase en contacto con Rolm Gala llamando al [phone] y presione la opcin 4 o envenos un mensaje a travs de Clinical cytogeneticist.   No podemos decirle cul ser su copago por los medicamentos por adelantado ya que esto es diferente dependiendo de la cobertura de su seguro. Sin embargo, es posible que podamos encontrar un medicamento sustituto a Audiological scientist un formulario para que el seguro cubra el medicamento que se considera necesario.   Si se requiere una autorizacin previa para que su compaa de seguros [place] su medicamento, por favor permtanos de 1 a 2 das hbiles para completar [address]  Los precios de los medicamentos varan con frecuencia dependiendo del Environmental consultant de dnde se surte la receta y alguna farmacias pueden ofrecer precios ms baratos.  El sitio web www.goodrx.com tiene cupones para medicamentos de Health and safety inspector. Los precios aqu no tienen en cuenta lo que podra costar con la ayuda del seguro (puede ser ms barato con su seguro), pero el sitio web puede darle el precio si no utiliz Tourist information centre manager.  - Puede imprimir el cupn correspondiente y llevarlo con su receta a la farmacia.  - Tambin puede pasar por nuestra oficina durante el horario de atencin regular y Education officer, museum una tarjeta de cupones de GoodRx.  - Si necesita que su receta se enve electrnicamente a una farmacia diferente, informe a nuestra oficina a travs de MyChart de [place] o  por telfono llamando al [phone] y presione la opcin 4.

## 2023-04-01 NOTE — Progress Notes (Signed)
Follow-Up Visit   Subjective  [name] is a 57 y.o. male who presents for the following: Skin Cancer Screening and Full Body Skin Exam  The patient presents for Total-Body Skin Exam (TBSE) for skin cancer screening and mole check. The patient has spots, moles and lesions to be evaluated, some may be new or changing and the patient may have concern these could be cancer.  Patient with hx of BCC, AK's. Patient treated face with red light PDT in January, advises it was painful and he could not really tell much of a difference in smoothness of skin.   The following portions of the chart were reviewed this encounter and updated as appropriate: medications, allergies, medical history  Review of Systems:  No other skin or systemic complaints except as noted in HPI or Assessment and Plan.  Objective  Well appearing patient in no apparent distress; mood and affect are within normal limits.  A full examination was performed including scalp, head, eyes, ears, nose, lips, neck, chest, axillae, abdomen, back, buttocks, bilateral upper extremities, bilateral lower extremities, hands, feet, fingers, toes, fingernails, and toenails. All findings within normal limits unless otherwise noted below.   Relevant physical exam findings are noted in the Assessment and Plan.  Right Postauricular 8 mm scaly papule with hemorrhagic crusting      Central upper back 8 mm scar like macule     Left upper lateral arm 6 mm scar like macule     Right Upper Back 5 mm pink papule       Assessment & Plan   SKIN CANCER SCREENING PERFORMED TODAY.  ACTINIC DAMAGE - Chronic condition, secondary to cumulative UV/sun exposure - diffuse scaly erythematous macules with underlying dyspigmentation - Recommend daily broad spectrum sunscreen SPF 30+ to sun-exposed areas, reapply every 2 hours as needed.  - Staying in the shade or wearing long sleeves, sun glasses (UVA+UVB protection) and wide  brim hats (4-inch brim around the entire circumference of the hat) are also recommended for sun protection.  - Call for new or changing lesions.  LENTIGINES, SEBORRHEIC KERATOSES, HEMANGIOMAS - Benign normal skin lesions - Benign-appearing - Call for any changes  MELANOCYTIC NEVI - Tan-brown and/or pink-flesh-colored symmetric macules and papules - Benign appearing on exam today - Observation - Call clinic for new or changing moles - Recommend daily use of broad spectrum spf 30+ sunscreen to sun-exposed areas.   HISTORY OF BASAL CELL CARCINOMA OF THE SKIN - No evidence of recurrence today - Recommend regular full body skin exams - Recommend daily broad spectrum sunscreen SPF 30+ to sun-exposed areas, reapply every 2 hours as needed.  - Call if any new or changing lesions are noted between office visits     Neoplasm of uncertain behavior of skin (4) Right Postauricular  Skin / nail biopsy Type of biopsy: tangential   Informed consent: discussed and consent obtained   Timeout: patient name, date of birth, surgical site, and procedure verified   Procedure prep:  Patient was prepped and draped in usual sterile fashion Prep type:  Isopropyl alcohol Anesthesia: the lesion was anesthetized in a standard fashion   Anesthetic:  1% lidocaine w/ epinephrine 1-100,000 buffered w/ 8.4% NaHCO3 Instrument used: DermaBlade   Hemostasis achieved with: pressure and aluminum chloride   Outcome: patient tolerated procedure well   Post-procedure details: sterile dressing applied and wound care instructions given   Dressing type: bandage and petrolatum    Central upper back  Skin / nail biopsy  Type of biopsy: tangential   Informed consent: discussed and consent obtained   Timeout: patient name, date of birth, surgical site, and procedure verified   Procedure prep:  Patient was prepped and draped in usual sterile fashion Prep type:  Isopropyl alcohol Anesthesia: the lesion was anesthetized  in a standard fashion   Anesthetic:  1% lidocaine w/ epinephrine 1-100,000 buffered w/ 8.4% NaHCO3 Instrument used: DermaBlade   Hemostasis achieved with: pressure and aluminum chloride   Outcome: patient tolerated procedure well   Post-procedure details: sterile dressing applied and wound care instructions given   Dressing type: bandage and petrolatum    Left upper lateral arm  Skin / nail biopsy Type of biopsy: tangential   Informed consent: discussed and consent obtained   Timeout: patient name, date of birth, surgical site, and procedure verified   Procedure prep:  Patient was prepped and draped in usual sterile fashion Prep type:  Isopropyl alcohol Anesthesia: the lesion was anesthetized in a standard fashion   Anesthetic:  1% lidocaine w/ epinephrine 1-100,000 buffered w/ 8.4% NaHCO3 Instrument used: DermaBlade   Hemostasis achieved with: pressure and aluminum chloride   Outcome: patient tolerated procedure well   Post-procedure details: sterile dressing applied and wound care instructions given   Dressing type: bandage and petrolatum    Right Upper Back  Skin / nail biopsy Type of biopsy: tangential   Informed consent: discussed and consent obtained   Timeout: patient name, date of birth, surgical site, and procedure verified   Procedure prep:  Patient was prepped and draped in usual sterile fashion Prep type:  Isopropyl alcohol Anesthesia: the lesion was anesthetized in a standard fashion   Anesthetic:  1% lidocaine w/ epinephrine 1-100,000 buffered w/ 8.4% NaHCO3 Instrument used: DermaBlade   Hemostasis achieved with: pressure and aluminum chloride   Outcome: patient tolerated procedure well   Post-procedure details: sterile dressing applied and wound care instructions given   Dressing type: bandage and petrolatum     Return in about 6 months (around 10/02/2023) for TBSE, Hx BCC, Hx AK.  Anise Salvo, RMA, am acting as scribe for Elie Goody, MD  .   Documentation: I have reviewed the above documentation for accuracy and completeness, and I agree with the above.  Elie Goody, MD

## 2023-04-01 NOTE — Telephone Encounter (Addendum)
**Note De-Identified Kalliope Riesen Obfuscation** Per the [employer] Portal, a Itamar PA is not required as follows:  The following solutions for the service date entered do not require Pre-Authorization by [employer]: [id number] Sleep study, unattended, simultaneous recording; heart rate, oxygen saturation, respiratory analysis (e.g., by air flow or peripheral arterial tone) and sleep time Devices using peripheral arterial tone (PAT)  I called the pt but got no answer so I left a message on his VM (ok per DPR) advising him that I am sending him a [facility] message with his Pin # so he can proceed with his WatchPAT One-HST and that I would leave advisement in the message concerning getting a Itamar-HST prior auth approved through the [place].  Please see Patient Message from 8/20 for message sent to the pt.

## 2023-04-03 ENCOUNTER — Encounter (INDEPENDENT_AMBULATORY_CARE_PROVIDER_SITE_OTHER): Payer: BC Managed Care – PPO | Admitting: Cardiology

## 2023-04-03 DIAGNOSIS — G4733 Obstructive sleep apnea (adult) (pediatric): Secondary | ICD-10-CM

## 2023-04-04 ENCOUNTER — Telehealth: Payer: Self-pay | Admitting: *Deleted

## 2023-04-04 ENCOUNTER — Ambulatory Visit: Payer: No Typology Code available for payment source | Attending: Cardiology

## 2023-04-04 DIAGNOSIS — R400 Somnolence: Secondary | ICD-10-CM

## 2023-04-04 DIAGNOSIS — I10 Essential (primary) hypertension: Secondary | ICD-10-CM

## 2023-04-04 DIAGNOSIS — G4733 Obstructive sleep apnea (adult) (pediatric): Secondary | ICD-10-CM

## 2023-04-04 NOTE — Telephone Encounter (Signed)
The patient has been notified of the result. Left detailed message on voicemail and informed patient to call back..Jones, Dorothea Green, CMA   

## 2023-04-04 NOTE — Telephone Encounter (Signed)
-----   Message from Armanda Magic sent at 04/04/2023  9:16 AM EDT ----- Please let patient know that they have sleep apnea and recommend treating with CPAP.  Please order an auto CPAP from 4-15cm H2O with heated humidity and mask of choice.  Order overnight pulse ox on CPAP.  Followup with me in 6 weeks.

## 2023-04-04 NOTE — Procedures (Addendum)
   SLEEP STUDY REPORT Patient Information Study Date: 04/03/2023 Patient Name: [name] Patient ID: [id number] Birth Date: [birth year] Age: 57 Gender: Male BMI: 50.2 (W=256 lb, H=5' 0'') Referring Physician: Sherie Don, NP  TEST DESCRIPTION: Home sleep apnea testing was completed using the WatchPat, a Type 1 device, utilizing  peripheral arterial tonometry (PAT), chest movement, actigraphy, pulse oximetry, pulse rate, body position and snore.  AHI was calculated with apnea and hypopnea using valid sleep time as the denominator. RDI includes apneas,  hypopneas, and RERAs. The data acquired and the scoring of sleep and all associated events were performed in  accordance with the recommended standards and specifications as outlined in the AASM Manual for the Scoring of  Sleep and Associated Events 2.2.0 (2015).  FINDINGS:  1. Moderate Obstructive Sleep Apnea with AHI 14.6/hr.   2. No Central Sleep Apnea with pAHIc 0.7/hr.  3. Oxygen desaturations as low as 76%.  4. Moderate snoring was present. O2 sats were < 88% for 4.8 min.  5. Total sleep time was 8 hrs and 16 min.  6. 18.2% of total sleep time was spent in REM sleep.   7. Normal sleep onset latency at 17 min  8. ProlongedREM sleep onset latency at 344 min.   9. Total awakenings were 14.  10. Arrhythmia detection: None  DIAGNOSIS:  Moderate Obstructive Sleep Apnea (G47.33)  RECOMMENDATIONS: 1. Clinical correlation of these findings is necessary. The decision to treat obstructive sleep apnea (OSA) is usually  based on the presence of apnea symptoms or the presence of associated medical conditions such as Hypertension,  Congestive Heart Failure, Atrial Fibrillation or Obesity. The most common symptoms of OSA are snoring, gasping for  breath while sleeping, daytime sleepiness and fatigue.   2. Initiating apnea therapy is recommended given the presence of symptoms and/or associated conditions.  Recommend proceeding with one  of the following:   a. Auto-CPAP therapy with a pressure range of 5-20cm H2O.   b. An oral appliance (OA) that can be obtained from certain dentists with expertise in sleep medicine. These are  primarily of use in non-obese patients with mild and moderate disease.   c. An ENT consultation which may be useful to look for specific causes of obstruction and possible treatment  options.   d. If patient is intolerant to PAP therapy, consider referral to ENT for evaluation for hypoglossal nerve stimulator.   3. Close follow-up is necessary to ensure success with CPAP or oral appliance therapy for maximum benefit .  4. A follow-up oximetry study on CPAP is recommended to assess the adequacy of therapy and determine the need  for supplemental oxygen or the potential need for Bi-level therapy. An arterial blood gas to determine the adequacy of  baseline ventilation and oxygenation should also be considered.  5. Healthy sleep recommendations include: adequate nightly sleep (normal 7-9 hrs/night), avoidance of caffeine after  noon and alcohol near bedtime, and maintaining a sleep environment that is cool, dark and quiet.  6. Weight loss for overweight patients is recommended. Even modest amounts of weight loss can significantly  improve the severity of sleep apnea.  7. Snoring recommendations include: weight loss where appropriate, side sleeping, and avoidance of alcohol before  bed.  8. Operation of motor vehicle should not be performed when sleepy.  Signature: Armanda Magic, MD; [facility]; Diplomat, American Board of Sleep  Medicine Electronically Signed: 04/04/2023 9:14:15 AM

## 2023-04-05 NOTE — Addendum Note (Signed)
Addended by: Reesa Chew on: 04/05/2023 12:26 PM   Modules accepted: Orders

## 2023-04-05 NOTE — Telephone Encounter (Signed)
RETURN CALL: The patient has been notified of the result and verbalized understanding.  All questions (if any) were answered. [name], CMA 04/05/2023 12:13 PM    Upon patient request DME selection is [facility]. Patient understands he will be contacted by [facility] to set up his cpap. Patient understands to call if [facility] does not contact him with new setup in a timely manner. Patient understands they will be called once confirmation has been received from [facility]/ that they have received their new machine to schedule 10 week follow up appointment.   [facility] notified of new cpap order  Please add to airview Patient was grateful for the call and thanked me.

## 2023-04-09 ENCOUNTER — Encounter: Payer: Self-pay | Admitting: Dermatology

## 2023-04-10 ENCOUNTER — Telehealth: Payer: Self-pay

## 2023-04-10 NOTE — Telephone Encounter (Signed)
Patient sent message via MyChart regarding pathology results. Responded to patient via MyChart. Advised him to call to discuss results and treatment options.

## 2023-04-10 NOTE — Telephone Encounter (Signed)
-----   Message from [facility] sent at 04/05/2023  3:41 PM EDT ----- Diagnosis: 1. Skin , right postauricular SEBORRHEIC KERATOSIS, IRRITATED, CRUSTED 2. 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 Skin , left upper lateral arm SUPERFICIAL AND NODULAR BASAL CELL CARCINOMA 4. Skin , right upper back SUPERFICIAL AND NODULAR BASAL CELL CARCINOMA  Please call to share diagnoses and discuss treatment options.   1. Benign ISK, no treatment needed  2. This is a squamous cell skin cancer limited to the surface of the skin. It has the potential to spread beyond the skin and threaten your health, so I recommend treating it. Treatment: ED&C vs 5FU/calcipotriene  3 and 4. These are basal cell skin cancers on the skin surface and in the deeper layer of the skin. Spread of BCC is extremely rare, but they can make nonhealing wounds and disrupt nearby structures if not fully removed. Treatment option 1: you return for a 15 minute appointment where we perform electrodesiccation and curettage [facility]). This involves three rounds of scraping and burning to destroy the skin cancer. It has about an 75% cure rate and leaves a round wound slightly larger than the skin cancer and leaves a round white scar. No additional pathology is done. If the skin cancer comes back, we would need to do a surgery to remove it.   Treatment option 2: you return for an hour long appointment where we perform a skin surgery. We numb the site of the skin cancer and a safety margin of normal skin around it. We remove the full thickness of skin and close the wound with two layers of stitches. The sample is sent to the lab to check that the skin cancer was fully removed. Return one week later to have wound checked and surface stitches removed. Surgical wound leaves a line scar. Approximately 95% cure rate. Risk of recurrence, bleeding, infection, pain, injury to nearby structures, hypertrophic scar.

## 2023-04-12 ENCOUNTER — Ambulatory Visit: Payer: No Typology Code available for payment source

## 2023-04-17 ENCOUNTER — Telehealth: Payer: Self-pay

## 2023-04-17 NOTE — Telephone Encounter (Signed)
Discussed pathology results and treatment options with patient. He has had EDC in the past to skin cancers. Prefers to come in for [facility]. Appointment scheduled.

## 2023-04-17 NOTE — Telephone Encounter (Signed)
-----   Message from [facility] sent at 04/05/2023  3:41 PM EDT ----- Diagnosis: 1. Skin , right postauricular SEBORRHEIC KERATOSIS, IRRITATED, CRUSTED 2. 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 Skin , left upper lateral arm SUPERFICIAL AND NODULAR BASAL CELL CARCINOMA 4. Skin , right upper back SUPERFICIAL AND NODULAR BASAL CELL CARCINOMA  Please call to share diagnoses and discuss treatment options.   1. Benign ISK, no treatment needed  2. This is a squamous cell skin cancer limited to the surface of the skin. It has the potential to spread beyond the skin and threaten your health, so I recommend treating it. Treatment: ED&C vs 5FU/calcipotriene  3 and 4. These are basal cell skin cancers on the skin surface and in the deeper layer of the skin. Spread of BCC is extremely rare, but they can make nonhealing wounds and disrupt nearby structures if not fully removed. Treatment option 1: you return for a 15 minute appointment where we perform electrodesiccation and curettage [facility]). This involves three rounds of scraping and burning to destroy the skin cancer. It has about an 75% cure rate and leaves a round wound slightly larger than the skin cancer and leaves a round white scar. No additional pathology is done. If the skin cancer comes back, we would need to do a surgery to remove it.   Treatment option 2: you return for an hour long appointment where we perform a skin surgery. We numb the site of the skin cancer and a safety margin of normal skin around it. We remove the full thickness of skin and close the wound with two layers of stitches. The sample is sent to the lab to check that the skin cancer was fully removed. Return one week later to have wound checked and surface stitches removed. Surgical wound leaves a line scar. Approximately 95% cure rate. Risk of recurrence, bleeding, infection, pain, injury to nearby structures, hypertrophic scar.

## 2023-04-18 ENCOUNTER — Ambulatory Visit: Payer: No Typology Code available for payment source | Admitting: Cardiology

## 2023-04-18 ENCOUNTER — Ambulatory Visit: Payer: BC Managed Care – PPO | Attending: Cardiology

## 2023-04-18 DIAGNOSIS — I4819 Other persistent atrial fibrillation: Secondary | ICD-10-CM

## 2023-04-18 LAB — ECHOCARDIOGRAM COMPLETE
AR max vel: 2.51 cm2
AV Peak grad: 11.6 mmHg
Ao pk vel: 1.71 m/s
Area-P 1/2: 4.49 cm2
Calc EF: 51.9 %
MV M vel: 2.14 m/s
MV Peak grad: 18.3 mmHg
S' Lateral: 4.4 cm
Single Plane A2C EF: 53.3 %
Single Plane A4C EF: 51.9 %

## 2023-04-21 NOTE — Progress Notes (Unsigned)
Cardiology Office Note Date:  04/21/2023  Patient ID:  [name], [name] Dec 09, 1965, MRN [id number] PCP:  Lynnea Ferrier, MD  Cardiologist:  Sherryl Manges, MD Electrophysiologist: Sherryl Manges, MD    Chief Complaint: fatigue  History of Present Illness: [name] is a 57 y.o. male with PMH notable for persis AFib, PVCs, HFmrEF, NICM, HTN, newly diag OSA; seen today for Sherryl Manges, MD for routine electrophysiology followup.  He last saw Dr. Graciela Husbands 05/2022, was doing well without afib episodes and tolerating GDMT well.  He saw PCP earlier this month, spiro stopped d/t concern for hypotension. Zepbound started for weight loss I saw him two weeks ago for acute visit d/t reduced energy, getting winded quicker with walking and increased sweating. BP low in office and on home readings - switched BB from coreg > toprol. Rec 7d monitor to eval PVC/afib burden, updated echo, and Itamar study to eval. Echo is grossly similar. Ambulatory monitor is not finalized, but PVC burden appears low at 2.3%. Itamar did reveal moderate OSA with CPAP recommended.   On follow-up today,  *** AF burden, symptoms *** palpitations *** bleeding concerns  *** BP readings at home *** Using CPAP?   He denies chest pain, palpitations, dyspnea, PND, orthopnea, nausea, vomiting, dizziness, syncope, edema, weight gain, or early satiety.       AAD History: none  Past Medical History:  Diagnosis Date   Anxiety    Arthritis    gout   Atrial fibrillation (HCC) 1989   Basal cell carcinoma 06/23/2020   Upper mid back. Nodular.   Basal cell carcinoma 12/28/2020   Right upper arm, superficial, EDC 03/07/2021   Basal cell carcinoma 04/01/2023   Left upper lateral arm. Superficial and nodular. EDC pending   Basal cell carcinoma 04/01/2023   Right upper back. Superficial and nodular. 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 Hypertrophic. EDC pending.   throat cancer     Past Surgical History:  Procedure Laterality Date   CARDIAC CATHETERIZATION  03/2010   No sig coronary disease, EF ~40%   CARDIOVERSION  1989   TOTAL KNEE ARTHROPLASTY Left     Current Outpatient Medications  Medication Instructions   ALLOPURINOL PO 400 mg, Oral, Daily   ascorbic acid (VITAMIN C) 500 mg, Oral, Daily   b complex vitamins capsule 1 capsule, Oral, Daily   Coenzyme Q10 (CO Q-10) 100 MG CAPS 1 capsule, Oral, Daily   cyclobenzaprine (FLEXERIL) 10 MG tablet Oral, Daily   Docusate Sodium 100 mg, Oral, Daily   empagliflozin (JARDIANCE) 12.5 mg, Oral, Daily   ENTRESTO 97-103 MG 1 tablet, Oral, 2 times daily   fenofibrate 160 MG tablet 1 tablet, Oral, Daily   FLUOXETINE HCL PO 80 mg, Oral, Daily   Heliocare 240 mg, Oral, Every evening   Ibuprofen 800 mg, Oral, 2 times daily PRN  icosapent Ethyl (VASCEPA) 4 g, Oral, 2 times daily   LEVOTHYROXINE SODIUM PO 224 mcg, Oral, Daily   meclizine (ANTIVERT) 25 mg, Oral, 3 times daily PRN   Melatonin 20 mg, Oral, Daily at bedtime, Takes 2 capsules po at bedtime for sleep.    metoprolol succinate (TOPROL-XL) 50 mg, Oral, Nightly, Take with or immediately following a meal.   mirtazapine (REMERON) 30 mg, Oral, Daily   Multiple Vitamin (MULTIVITAMIN) tablet 1 tablet, Oral, Daily   mupirocin ointment (BACTROBAN) 2 % 1 application , Topical, Daily   Niacinamide 500 MG TBCR 1 tablet, Oral, 2 times daily, For skin cancer prevention.   rivaroxaban (XARELTO) 20 mg, Oral,  Daily   rosuvastatin (CRESTOR) 40 mg, Oral, Daily   spironolactone (ALDACTONE) 25 mg, Oral, Daily   THERATEARS 0.25 % SOLN 1 drop, Both Eyes, As needed   Turmeric 500 MG TABS Oral, Daily    Social History:  The patient  reports that he has never smoked. He has never used smokeless tobacco. He reports current alcohol use of about 2.0 - 3.0 standard drinks of alcohol per week. He reports that he does not use drugs.   Family History:  The patient's family history includes Arthritis in his mother; COPD in his mother; Coronary artery disease in his paternal grandfather; Crohn's disease in his brother; Depression in his mother; Diabetes in his mother; Heart attack in his father; Lung cancer in his maternal grandfather, paternal uncle, and paternal uncle; Pancreatic cancer in his paternal uncle; Thyroid cancer in his paternal aunt.  ROS:  Please see the history of present illness. All other systems are reviewed and otherwise negative.   PHYSICAL EXAM:  VS:  There were no vitals taken for this visit. BMI: There is no height or weight on file to calculate BMI.  No data found.   GEN- The patient is well appearing, alert and oriented x 3 today.   Lungs- Clear to ausculation bilaterally, normal work of breathing.  Heart- Regular w freq ectopic beats, no murmurs, rubs or gallops Extremities- No peripheral edema, warm, dry   EKG is not ordered. Personal review of EKG from  03/21/2023  shows:   NSR w PVC, rate 67bpm       Recent Labs: 03/21/2023: BUN 24; Creatinine, Ser 1.19; Hemoglobin 13.1; Platelets 249; Potassium 4.5; Sodium 139; TSH 1.160  No results found for requested labs within last 365 days.   CrCl cannot be calculated (Patient's most recent lab result is older than the maximum 21 days allowed.).   Wt Readings from Last 3 Encounters:  03/21/23 255 lb (115.7 kg)  03/13/23 255 lb (115.7 kg)  01/23/23 250 lb (113.4 kg)     Additional studies reviewed include: Previous EP, cardiology  notes.   TTE, 04/18/2023  1. Left ventricular ejection fraction, by estimation, is 35 to 40%. The left ventricle has moderately decreased function. The left ventricle has no regional wall motion abnormalities. The left ventricular internal cavity size was mildly dilated. Left ventricular diastolic parameters were normal.   2. Right ventricular systolic function is normal. The right ventricular size is normal. There is normal pulmonary artery systolic pressure. The estimated right ventricular systolic pressure is 31.6 mmHg.   3. Left atrial size was mildly dilated.   4. The mitral valve is normal in structure. No evidence of mitral valve regurgitation. No evidence of mitral stenosis.   5. The aortic valve has an indeterminant number of cusps. Aortic valve regurgitation is not visualized. Aortic valve  sclerosis is present, with no evidence of aortic valve stenosis.   6. The inferior vena cava is normal in size with greater than 50% respiratory variability, suggesting right atrial pressure of 3 mmHg.   Comparison(s): Previous Echo at the [place] showed LV EF 30-35%, moderate LAE.   Long term monitor, 04/11/2023, preliminary result Patient had a min HR of 52 bpm, max HR of 167 bpm, and avg HR of 77 bpm. Predominant underlying rhythm was Sinus Rhythm. 2 Ventricular Tachycardia runs occurred, the run with the fastest interval lasting 9 beats with a max rate of 167 bpm (avg 103 bpm);  the run with the fastest interval was also the longest. 3 Supraventricular Tachycardia runs occurred, the run with the fastest interval lasting 6 beats with a max rate of 154 bpm, the longest lasting 6 beats with an avg rate of 111 bpm. Idioventricular  Rhythm was present. Idioventricular Rhythm was detected within +/- 45 seconds of symptomatic patient event(s). Isolated SVEs were rare (<1.0%), SVE Couplets were rare (<1.0%), and SVE Triplets were rare (<1.0%). Isolated VEs were occasional (2.3%,  30429), VE Couplets were rare (<1.0%,  3753), and VE Triplets were rare (<1.0%, 86). Ventricular Bigeminy and Trigeminy were present.   [facility] into chart   TTE, 09/02/2020 1. Left ventricular ejection fraction, by estimation, is 30 to 35%. The left ventricle has moderately decreased function. The left ventricle demonstrates global hypokinesis. Grossly, unable to exclude regional wall motion abnormality. The left ventricular internal cavity size was mildly to moderately dilated.   2. Right ventricular systolic function is normal. The right ventricular size is normal. Tricuspid regurgitation signal is inadequate for assessing PA pressure.   3. Left atrial size was moderately dilated.   4. The mitral valve is normal in structure. Mild mitral valve regurgitation.   5. The inferior vena cava is dilated in size with >50% respiratory variability, suggesting right atrial pressure of 8 mmHg.   6. Challenging images. Definity used.  ASSESSMENT AND PLAN:  #) persis AFib #) PVC PVC burden ~2% on recent ambulatory monitor   #) Hypercoag d/t persis afib CHA2DS2-VASc Score = 2 [CHF History: 1, HTN History: 1, Diabetes History: 0, Stroke History: 0, Vascular Disease History: 0, Age Score: 0, Gender Score: 0].  Therefore, the patient's annual risk of stroke is 2.2 %.  Stroke ppx - 20mg  xarelto daily, appropriately dosed for CrCl > 50 No bleeding concerns  #) HFimpEF (though still mid-range) Most recent echo with LVEF 35-40% GDMT: spiro, entresto 97-103, jardiance, metop  #) OSA Recent sleep study revealed moderate OSA with CPAP recommended.         Current medicines are reviewed at length with the patient today.   The patient has concerns regarding his medicines.  The following changes were made today:   STOP coreg START toprol 50mg  nightly  Labs/ tests ordered today include:  No orders of the defined types were placed in this encounter.    Disposition: Follow up with Dr. Graciela Husbands or EP APP   4-6 weeks    Signed, Sherie Don, NP  04/21/23  4:57 PM  Electrophysiology [facility]

## 2023-04-22 ENCOUNTER — Ambulatory Visit: Payer: BC Managed Care – PPO | Attending: Cardiology | Admitting: Cardiology

## 2023-04-22 ENCOUNTER — Encounter: Payer: Self-pay | Admitting: Cardiology

## 2023-04-22 VITALS — BP 112/70 | HR 66 | Ht 71.0 in | Wt 259.6 lb

## 2023-04-22 DIAGNOSIS — I493 Ventricular premature depolarization: Secondary | ICD-10-CM

## 2023-04-22 DIAGNOSIS — I4819 Other persistent atrial fibrillation: Secondary | ICD-10-CM

## 2023-04-22 DIAGNOSIS — I5022 Chronic systolic (congestive) heart failure: Secondary | ICD-10-CM

## 2023-04-22 DIAGNOSIS — Z7901 Long term (current) use of anticoagulants: Secondary | ICD-10-CM

## 2023-04-22 NOTE — Patient Instructions (Signed)
Medication Instructions:  Your physician recommends that you continue on your current medications as directed. Please refer to the Current Medication list given to you today.  *If you need a refill on your cardiac medications before your next appointment, please call your pharmacy*  Lab Work: -None ordered  Testing/Procedures: -None ordered  Follow-Up: At [facility], you and your health needs are our priority.  As part of our continuing mission to provide you with exceptional heart care, we have created designated Provider Care Teams.  These Care Teams include your primary Cardiologist (physician) and Advanced Practice Providers (APPs -  Physician Assistants and Nurse Practitioners) who all work together to provide you with the care you need, when you need it.  Your next appointment:   4 - 5 month(s)  Provider:   Sherryl Manges, MD    Other Instructions -None

## 2023-04-23 ENCOUNTER — Telehealth: Payer: Self-pay

## 2023-04-23 NOTE — Telephone Encounter (Signed)
LM for patient to call back to answer pre virtual appointment questions.

## 2023-04-24 ENCOUNTER — Ambulatory Visit
Payer: No Typology Code available for payment source | Attending: Student in an Organized Health Care Education/Training Program | Admitting: Student in an Organized Health Care Education/Training Program

## 2023-04-24 DIAGNOSIS — M5416 Radiculopathy, lumbar region: Secondary | ICD-10-CM | POA: Diagnosis not present

## 2023-04-24 DIAGNOSIS — G894 Chronic pain syndrome: Secondary | ICD-10-CM

## 2023-04-24 DIAGNOSIS — M47816 Spondylosis without myelopathy or radiculopathy, lumbar region: Secondary | ICD-10-CM

## 2023-04-24 DIAGNOSIS — M48062 Spinal stenosis, lumbar region with neurogenic claudication: Secondary | ICD-10-CM

## 2023-04-24 NOTE — Progress Notes (Signed)
Height as of 04/22/23: 5\' 11"  (1.803 m).   Weight as of 04/22/23: 259 lb 9.6 oz (117.8 kg). Last encounter: 01/23/2023. Last procedure: 03/13/2023.  HPI  Today, he is being contacted for a post-procedure assessment.   Post-procedure evaluation  Type: Thermal Lumbar Facet, Medial Branch Radiofrequency Ablation/Neurotomy  #6 Primary Purpose: Therapeutic Region: Posterolateral Lumbosacral Spine Level:  L3, L4, L5, Medial Branch Level(s). These levels will denervate the L3-4, L4-5,lumbar facet joints. Laterality:  Bilateral Effectiveness:  Initial hour after procedure: 100 %  Subsequent 4-6 hours post-procedure: 80 %  Analgesia past initial 6 hours: 50 % (currently)  Ongoing improvement:  Analgesic:  50% Function: Somewhat improved ROM: Somewhat improved   Laboratory Chemistry Profile   Renal Lab Results  Component Value Date   BUN 24 03/21/2023   CREATININE 1.19 03/21/2023   BCR 20 03/21/2023   GFR 77.83 06/26/2016   GFRAA >60 10/13/2019   GFRNONAA >60 10/13/2019    Hepatic Lab Results  Component Value Date   AST 34 05/09/2017   ALT 34 05/09/2017   ALBUMIN 4.5 05/09/2017   ALKPHOS 66 05/09/2017   HCVAB NEG 10/29/2008   AMYLASE 41 11/06/2008   LIPASE 19 11/06/2008    Electrolytes Lab Results  Component Value Date   NA 139 03/21/2023   K 4.5 03/21/2023   CL 100 03/21/2023   CALCIUM 9.5 03/21/2023   MG 2.1 04/16/2013   PHOS 2.5 06/13/2010    Bone No results found for: "VD25OH", "VD125OH2TOT", "YQ6578IO9", "GE9528UX3", "25OHVITD1", "25OHVITD2", "25OHVITD3", "TESTOFREE", "TESTOSTERONE"  Inflammation (CRP: Acute Phase) (ESR: Chronic Phase) Lab Results  Component Value Date   CRP 2.7 07/27/2014   ESRSEDRATE 9 07/27/2014         Note: Above Lab results  reviewed.  Imaging  ECHOCARDIOGRAM COMPLETE    ECHOCARDIOGRAM REPORT       Patient Name:   [name] Date of Exam: 04/18/2023 Medical Rec #:  [id number]           Height:       70.0 in Accession #:    [id number]          Weight:       255.0 lb Date of Birth:  Nov 01, 1965           BSA:          2.314 m Patient Age:    57 years            BP:           108/77 mmHg Patient Gender: M                   HR:           66 bpm. Exam Location:  [place]  Procedure: 2D Echo, Cardiac Doppler and Color Doppler  Indications:    I48.1 Persistent atrial fibrillation   History:        Patient has prior history of Echocardiogram examinations, most                 recent 09/02/2020. NICM and CHF, Arrythmias:Atrial Fibrillation                 and PVC; Risk Factors:Hypertension, Dyslipidemia, Non-Smoker and                 Sleep Apnea.   Sonographer:    Vella Kohler BS, RVT, RDCS Referring Phys: [id number] [facility] RIDDLE    Sonographer Comments: Suboptimal parasternal window. IMPRESSIONS   1.  Patient: [name]  Service Category: E/M  Provider: Edward Jolly, MD  DOB: October 16, 1965  DOS: 04/24/2023  Location: Office  MRN: [id number]  Setting: Ambulatory outpatient  Referring Provider: Lynnea Ferrier, MD  Type: Established Patient  Specialty: Interventional Pain Management  PCP: Lynnea Ferrier, MD  Location: Remote location  Delivery: TeleHealth     Virtual Encounter - Pain Management PROVIDER NOTE: Information contained herein reflects review and annotations entered in association with encounter. Interpretation of such information and data should be left to medically-trained personnel. Information provided to patient can be located elsewhere in the medical record under "Patient Instructions". Document created using STT-dictation technology, any transcriptional errors that may result from process are unintentional.    Contact & Pharmacy Preferred: [phone] Home: [phone] (home) Mobile: [phone] (mobile) E-mail: [email or web address]  [id number] [place], [place] - [address] [place] [address] Phone: [phone] Fax: [phone]  [id number] - [place], [place] - [address] AT [facility] [address] [facility] [address] [place] [address] Phone: [phone] Fax: [phone]  [facility] [place], [place] - [address] [place] [address] Phone: [phone] Fax: [phone]   Pre-screening  Mr. Kizzie Bane offered "in-person" vs "virtual" encounter. He indicated preferring virtual for this encounter.   Reason COVID-19*  Social distancing based on CDC and AMA recommendations.   I contacted [name] on 04/24/2023 via telephone.      I clearly identified myself as Edward Jolly, MD. I verified that I was speaking with the correct person using two identifiers (Name: [name], and date of birth: August 20, 1965).  Consent I  sought verbal advanced consent from [name] for virtual visit interactions. I informed [name] of possible security and privacy concerns, risks, and limitations associated with providing "not-in-person" medical evaluation and management services. I also informed [name] of the availability of "in-person" appointments. Finally, I informed him that there would be a charge for the virtual visit and that he could be  personally, fully or partially, financially responsible for it. [name] expressed understanding and agreed to proceed.   Historic Elements   [name] is a 57 y.o. year old, male patient evaluated today after our last contact on 03/13/2023. 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 He also  has a past surgical history that includes Cardioversion (1989); Cardiac catheterization (03/2010); and Total knee arthroplasty (Left). Mr. Friedel has a current medication list which includes the following prescription(s): allopurinol, b complex vitamins, co q-10, cyclobenzaprine, docusate sodium, empagliflozin, entresto, fenofibrate, fluoxetine hcl, ibuprofen, icosapent ethyl, levothyroxine sodium, meclizine, melatonin, metoprolol succinate, mirtazapine, multivitamin, mupirocin ointment, niacinamide er, heliocare, rivaroxaban, rosuvastatin, theratears, turmeric, ascorbic acid, and spironolactone. He  reports that he has never smoked. He has never used smokeless tobacco. He reports current alcohol use of about 2.0 -  3.0 standard drinks of alcohol per week. He reports that he does not use drugs. Mr. Kohlmeyer has No Known Allergies.  BMI: Estimated body mass index is 36.21 kg/m as calculated from the following:  Height as of 04/22/23: 5\' 11"  (1.803 m).   Weight as of 04/22/23: 259 lb 9.6 oz (117.8 kg). Last encounter: 01/23/2023. Last procedure: 03/13/2023.  HPI  Today, he is being contacted for a post-procedure assessment.   Post-procedure evaluation  Type: Thermal Lumbar Facet, Medial Branch Radiofrequency Ablation/Neurotomy  #6 Primary Purpose: Therapeutic Region: Posterolateral Lumbosacral Spine Level:  L3, L4, L5, Medial Branch Level(s). These levels will denervate the L3-4, L4-5,lumbar facet joints. Laterality:  Bilateral Effectiveness:  Initial hour after procedure: 100 %  Subsequent 4-6 hours post-procedure: 80 %  Analgesia past initial 6 hours: 50 % (currently)  Ongoing improvement:  Analgesic:  50% Function: Somewhat improved ROM: Somewhat improved   Laboratory Chemistry Profile   Renal Lab Results  Component Value Date   BUN 24 03/21/2023   CREATININE 1.19 03/21/2023   BCR 20 03/21/2023   GFR 77.83 06/26/2016   GFRAA >60 10/13/2019   GFRNONAA >60 10/13/2019    Hepatic Lab Results  Component Value Date   AST 34 05/09/2017   ALT 34 05/09/2017   ALBUMIN 4.5 05/09/2017   ALKPHOS 66 05/09/2017   HCVAB NEG 10/29/2008   AMYLASE 41 11/06/2008   LIPASE 19 11/06/2008    Electrolytes Lab Results  Component Value Date   NA 139 03/21/2023   K 4.5 03/21/2023   CL 100 03/21/2023   CALCIUM 9.5 03/21/2023   MG 2.1 04/16/2013   PHOS 2.5 06/13/2010    Bone No results found for: "VD25OH", "VD125OH2TOT", "YQ6578IO9", "GE9528UX3", "25OHVITD1", "25OHVITD2", "25OHVITD3", "TESTOFREE", "TESTOSTERONE"  Inflammation (CRP: Acute Phase) (ESR: Chronic Phase) Lab Results  Component Value Date   CRP 2.7 07/27/2014   ESRSEDRATE 9 07/27/2014         Note: Above Lab results  reviewed.  Imaging  ECHOCARDIOGRAM COMPLETE    ECHOCARDIOGRAM REPORT       Patient Name:   [name] Date of Exam: 04/18/2023 Medical Rec #:  [id number]           Height:       70.0 in Accession #:    [id number]          Weight:       255.0 lb Date of Birth:  Nov 01, 1965           BSA:          2.314 m Patient Age:    57 years            BP:           108/77 mmHg Patient Gender: M                   HR:           66 bpm. Exam Location:  [place]  Procedure: 2D Echo, Cardiac Doppler and Color Doppler  Indications:    I48.1 Persistent atrial fibrillation   History:        Patient has prior history of Echocardiogram examinations, most                 recent 09/02/2020. NICM and CHF, Arrythmias:Atrial Fibrillation                 and PVC; Risk Factors:Hypertension, Dyslipidemia, Non-Smoker and                 Sleep Apnea.   Sonographer:    Vella Kohler BS, RVT, RDCS Referring Phys: [id number] [facility] RIDDLE    Sonographer Comments: Suboptimal parasternal window. IMPRESSIONS   1.  Patient: [name]  Service Category: E/M  Provider: Edward Jolly, MD  DOB: October 16, 1965  DOS: 04/24/2023  Location: Office  MRN: [id number]  Setting: Ambulatory outpatient  Referring Provider: Lynnea Ferrier, MD  Type: Established Patient  Specialty: Interventional Pain Management  PCP: Lynnea Ferrier, MD  Location: Remote location  Delivery: TeleHealth     Virtual Encounter - Pain Management PROVIDER NOTE: Information contained herein reflects review and annotations entered in association with encounter. Interpretation of such information and data should be left to medically-trained personnel. Information provided to patient can be located elsewhere in the medical record under "Patient Instructions". Document created using STT-dictation technology, any transcriptional errors that may result from process are unintentional.    Contact & Pharmacy Preferred: [phone] Home: [phone] (home) Mobile: [phone] (mobile) E-mail: [email or web address]  [id number] [place], [place] - [address] [place] [address] Phone: [phone] Fax: [phone]  [id number] - [place], [place] - [address] AT [facility] [address] [facility] [address] [place] [address] Phone: [phone] Fax: [phone]  [facility] [place], [place] - [address] [place] [address] Phone: [phone] Fax: [phone]   Pre-screening  Mr. Kizzie Bane offered "in-person" vs "virtual" encounter. He indicated preferring virtual for this encounter.   Reason COVID-19*  Social distancing based on CDC and AMA recommendations.   I contacted [name] on 04/24/2023 via telephone.      I clearly identified myself as Edward Jolly, MD. I verified that I was speaking with the correct person using two identifiers (Name: [name], and date of birth: August 20, 1965).  Consent I  sought verbal advanced consent from [name] for virtual visit interactions. I informed [name] of possible security and privacy concerns, risks, and limitations associated with providing "not-in-person" medical evaluation and management services. I also informed [name] of the availability of "in-person" appointments. Finally, I informed him that there would be a charge for the virtual visit and that he could be  personally, fully or partially, financially responsible for it. [name] expressed understanding and agreed to proceed.   Historic Elements   [name] is a 57 y.o. year old, male patient evaluated today after our last contact on 03/13/2023. 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 He also  has a past surgical history that includes Cardioversion (1989); Cardiac catheterization (03/2010); and Total knee arthroplasty (Left). Mr. Friedel has a current medication list which includes the following prescription(s): allopurinol, b complex vitamins, co q-10, cyclobenzaprine, docusate sodium, empagliflozin, entresto, fenofibrate, fluoxetine hcl, ibuprofen, icosapent ethyl, levothyroxine sodium, meclizine, melatonin, metoprolol succinate, mirtazapine, multivitamin, mupirocin ointment, niacinamide er, heliocare, rivaroxaban, rosuvastatin, theratears, turmeric, ascorbic acid, and spironolactone. He  reports that he has never smoked. He has never used smokeless tobacco. He reports current alcohol use of about 2.0 -  3.0 standard drinks of alcohol per week. He reports that he does not use drugs. Mr. Kohlmeyer has No Known Allergies.  BMI: Estimated body mass index is 36.21 kg/m as calculated from the following:  Patient: [name]  Service Category: E/M  Provider: Edward Jolly, MD  DOB: October 16, 1965  DOS: 04/24/2023  Location: Office  MRN: [id number]  Setting: Ambulatory outpatient  Referring Provider: Lynnea Ferrier, MD  Type: Established Patient  Specialty: Interventional Pain Management  PCP: Lynnea Ferrier, MD  Location: Remote location  Delivery: TeleHealth     Virtual Encounter - Pain Management PROVIDER NOTE: Information contained herein reflects review and annotations entered in association with encounter. Interpretation of such information and data should be left to medically-trained personnel. Information provided to patient can be located elsewhere in the medical record under "Patient Instructions". Document created using STT-dictation technology, any transcriptional errors that may result from process are unintentional.    Contact & Pharmacy Preferred: [phone] Home: [phone] (home) Mobile: [phone] (mobile) E-mail: [email or web address]  [id number] [place], [place] - [address] [place] [address] Phone: [phone] Fax: [phone]  [id number] - [place], [place] - [address] AT [facility] [address] [facility] [address] [place] [address] Phone: [phone] Fax: [phone]  [facility] [place], [place] - [address] [place] [address] Phone: [phone] Fax: [phone]   Pre-screening  Mr. Kizzie Bane offered "in-person" vs "virtual" encounter. He indicated preferring virtual for this encounter.   Reason COVID-19*  Social distancing based on CDC and AMA recommendations.   I contacted [name] on 04/24/2023 via telephone.      I clearly identified myself as Edward Jolly, MD. I verified that I was speaking with the correct person using two identifiers (Name: [name], and date of birth: August 20, 1965).  Consent I  sought verbal advanced consent from [name] for virtual visit interactions. I informed [name] of possible security and privacy concerns, risks, and limitations associated with providing "not-in-person" medical evaluation and management services. I also informed [name] of the availability of "in-person" appointments. Finally, I informed him that there would be a charge for the virtual visit and that he could be  personally, fully or partially, financially responsible for it. [name] expressed understanding and agreed to proceed.   Historic Elements   [name] is a 57 y.o. year old, male patient evaluated today after our last contact on 03/13/2023. 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 He also  has a past surgical history that includes Cardioversion (1989); Cardiac catheterization (03/2010); and Total knee arthroplasty (Left). Mr. Friedel has a current medication list which includes the following prescription(s): allopurinol, b complex vitamins, co q-10, cyclobenzaprine, docusate sodium, empagliflozin, entresto, fenofibrate, fluoxetine hcl, ibuprofen, icosapent ethyl, levothyroxine sodium, meclizine, melatonin, metoprolol succinate, mirtazapine, multivitamin, mupirocin ointment, niacinamide er, heliocare, rivaroxaban, rosuvastatin, theratears, turmeric, ascorbic acid, and spironolactone. He  reports that he has never smoked. He has never used smokeless tobacco. He reports current alcohol use of about 2.0 -  3.0 standard drinks of alcohol per week. He reports that he does not use drugs. Mr. Kohlmeyer has No Known Allergies.  BMI: Estimated body mass index is 36.21 kg/m as calculated from the following:

## 2023-04-29 ENCOUNTER — Telehealth: Payer: Self-pay | Admitting: *Deleted

## 2023-04-29 NOTE — Telephone Encounter (Signed)
The patient has been notified of the result via the telephone and his mychart.Latrelle Dodrill, CMA 04/29/2023 5:11 PM     DME selection is [facility].. Patient understands he will be contacted by [facility] to set up his cpap. Patient understands to call if [facility] does not contact him with new setup in a timely manner. Patient understands they will be called once confirmation has been received from [facility]/ that they have received their new machine to schedule 10 week follow up appointment.   [facility] notified of new cpap order  Please add to airview Patient was grateful for the call and thanked me.

## 2023-04-29 NOTE — Telephone Encounter (Signed)
-----  Message from Armanda Magic sent at 04/04/2023  9:16 AM EDT ----- Please let patient know that they have sleep apnea and recommend treating with CPAP.  Please order an auto CPAP from 4-15cm H2O with heated humidity and mask of choice.  Order overnight pulse ox on CPAP.  Followup with me in 6 weeks.

## 2023-05-03 ENCOUNTER — Ambulatory Visit: Payer: No Typology Code available for payment source

## 2023-05-09 ENCOUNTER — Telehealth: Payer: Self-pay | Admitting: Internal Medicine

## 2023-05-09 ENCOUNTER — Telehealth: Payer: Self-pay | Admitting: Cardiology

## 2023-05-09 NOTE — Telephone Encounter (Signed)
I attempted to call patient to discuss recommendations, no answer, left VM requesting a return call

## 2023-05-09 NOTE — Telephone Encounter (Signed)
Pt returning nurses phone call. Please advise ?

## 2023-05-10 ENCOUNTER — Other Ambulatory Visit: Payer: Self-pay

## 2023-05-10 ENCOUNTER — Ambulatory Visit: Payer: No Typology Code available for payment source

## 2023-05-10 DIAGNOSIS — I428 Other cardiomyopathies: Secondary | ICD-10-CM

## 2023-05-10 DIAGNOSIS — I5022 Chronic systolic (congestive) heart failure: Secondary | ICD-10-CM

## 2023-05-10 MED ORDER — ENTRESTO 49-51 MG PO TABS: 1.0000 | ORAL_TABLET | Freq: Two times a day (BID) | ORAL | 3 refills | Status: DC

## 2023-05-10 MED ORDER — SPIRONOLACTONE 25 MG PO TABS: 25.0000 mg | ORAL_TABLET | Freq: Every day | ORAL | 3 refills | Status: AC

## 2023-05-10 NOTE — Addendum Note (Signed)
Addended by: Sherie Don on: 05/10/2023 01:21 PM   Modules accepted: Orders

## 2023-05-10 NOTE — Telephone Encounter (Signed)
I returned patient's call, there are several issues we discussed  1 - medication changes - will reduce entresto to 49-51 and increase spiro to 25mg . After this change is made, update BMP about 2 weeks after this medication change.   2- updated cMRI  3- refer to Adv HF team for further eval of NICM  4 - genetic testing iso NICM, mild LVH, and sudden death of son   Patient verbalized understanding of all these recommendations.

## 2023-05-10 NOTE — Telephone Encounter (Signed)
Testing, labs, and medications ordered.  Thanks!

## 2023-05-14 ENCOUNTER — Telehealth: Payer: Self-pay | Admitting: Pharmacy Technician

## 2023-05-14 ENCOUNTER — Ambulatory Visit: Payer: No Typology Code available for payment source | Admitting: Dermatology

## 2023-05-14 ENCOUNTER — Other Ambulatory Visit (HOSPITAL_COMMUNITY): Payer: Self-pay

## 2023-05-14 NOTE — Telephone Encounter (Signed)
Pharmacy Patient Advocate Encounter   Received notification from CoverMyMeds that prior authorization for entresto is required/requested.   Insurance verification completed.   The patient is insured through [facility] .   Per test claim: PA required; PA submitted to [employer] [facility] via CoverMyMeds Key/confirmation #/EOC [id number] Status is pending

## 2023-05-15 ENCOUNTER — Ambulatory Visit
Admission: RE | Admit: 2023-05-15 | Discharge: 2023-05-15 | Disposition: A | Discharge status: Home / Self Care | Payer: No Typology Code available for payment source | Source: Ambulatory Visit | Attending: Student in an Organized Health Care Education/Training Program | Admitting: Student in an Organized Health Care Education/Training Program

## 2023-05-15 DIAGNOSIS — M47816 Spondylosis without myelopathy or radiculopathy, lumbar region: Secondary | ICD-10-CM | POA: Diagnosis present

## 2023-05-15 DIAGNOSIS — G894 Chronic pain syndrome: Secondary | ICD-10-CM | POA: Insufficient documentation

## 2023-05-15 DIAGNOSIS — M5416 Radiculopathy, lumbar region: Secondary | ICD-10-CM | POA: Diagnosis present

## 2023-05-15 DIAGNOSIS — M48062 Spinal stenosis, lumbar region with neurogenic claudication: Secondary | ICD-10-CM | POA: Insufficient documentation

## 2023-05-15 NOTE — Telephone Encounter (Signed)
Pharmacy Patient Advocate Encounter  Received notification from [employer] that Prior Authorization for entresto has been APPROVED from 05/14/23 to 05/12/24   PA #/Case ID/Reference #: [id number]

## 2023-05-16 NOTE — Telephone Encounter (Signed)
Called the patient to verify his medications.   He is taking: Jardiance 12.5 mg once daily Entresto 49-51 mg bid Spironolactone 25 mg  Blood pressure this morning was 110/68 and heart rate was 86 prior to his medications. He has not taken the metoprolol since yesterday.  The patient was currently at the [place]. Heart rate and blood pressure were: 119/85 78

## 2023-05-17 ENCOUNTER — Encounter: Payer: Self-pay | Admitting: Cardiology

## 2023-05-20 ENCOUNTER — Encounter: Payer: Self-pay | Admitting: Dermatology

## 2023-05-29 ENCOUNTER — Encounter: Payer: Self-pay | Admitting: Student in an Organized Health Care Education/Training Program

## 2023-06-03 ENCOUNTER — Encounter: Payer: Self-pay | Admitting: Dermatology

## 2023-06-03 ENCOUNTER — Ambulatory Visit (INDEPENDENT_AMBULATORY_CARE_PROVIDER_SITE_OTHER): Payer: No Typology Code available for payment source | Admitting: Dermatology

## 2023-06-03 DIAGNOSIS — C44519 Basal cell carcinoma of skin of other part of trunk: Secondary | ICD-10-CM | POA: Diagnosis not present

## 2023-06-03 DIAGNOSIS — D045 Carcinoma in situ of skin of trunk: Secondary | ICD-10-CM | POA: Diagnosis not present

## 2023-06-03 DIAGNOSIS — D099 Carcinoma in situ, unspecified: Secondary | ICD-10-CM

## 2023-06-03 DIAGNOSIS — C44619 Basal cell carcinoma of skin of left upper limb, including shoulder: Secondary | ICD-10-CM | POA: Diagnosis not present

## 2023-06-03 DIAGNOSIS — C4491 Basal cell carcinoma of skin, unspecified: Secondary | ICD-10-CM

## 2023-06-03 NOTE — Progress Notes (Addendum)
   Follow-Up Visit   Subjective  [name] is a 57 y.o. male who presents for the following: [facility]  The patient has spots, moles and lesions to be evaluated, some may be new or changing and the patient may have concern these could be cancer.  Patient was seen in office on 04/01/23. 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 All being treated with [facility] in office today.  The following portions of the chart were reviewed this encounter and updated as appropriate: medications, allergies, medical history  Review of Systems:  No other skin or systemic complaints except as noted in HPI or Assessment and Plan.  Objective  Well appearing patient in no apparent distress; mood and affect are within normal limits.  A focused examination was performed of the following areas: Back and left arm  Relevant exam findings are noted in the Assessment and Plan.  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  Destruction of lesion  Destruction method: electrodesiccation and curettage   Timeout:  patient name, date of birth, surgical site, and procedure verified Anesthesia: the lesion was anesthetized in a standard fashion   Anesthetic:  1% lidocaine w/ epinephrine 1-100,000 buffered w/ 8.4% NaHCO3 Curettage performed in three different directions: Yes   Electrodesiccation performed over the curetted area: Yes   Curettage cycles:  3 Final wound size (cm):  1.8 Hemostasis achieved with:  pressure, aluminum chloride and electrodesiccation Outcome: patient tolerated procedure well with no complications   Post-procedure details: wound care instructions given    Superficial nodular basal cell carcinoma (2) Right Upper Back  Destruction of lesion  Destruction  method: electrodesiccation and curettage   Timeout:  patient name, date of birth, surgical site, and procedure verified Anesthesia: the lesion was anesthetized in a standard fashion   Anesthetic:  1% lidocaine w/ epinephrine 1-100,000 buffered w/ 8.4% NaHCO3 Curettage performed in three different directions: Yes   Electrodesiccation performed over the curetted area: Yes   Curettage cycles:  3 Final wound size (cm):  1.4 Hemostasis achieved with:  pressure, aluminum chloride and electrodesiccation Outcome: patient tolerated procedure well with no complications   Post-procedure details: wound care instructions given    Left Upper Arm - Anterior  Destruction of lesion  Destruction method: electrodesiccation and curettage   Timeout:  patient name, date of birth, surgical site, and procedure verified Anesthesia: the lesion was anesthetized in a standard fashion   Anesthetic:  1% lidocaine w/ epinephrine 1-100,000 buffered w/ 8.4% NaHCO3 Curettage performed in three different directions: Yes   Electrodesiccation performed over the curetted area: Yes   Curettage cycles:  3 Final wound size (cm):  1.1 Hemostasis achieved with:  pressure, aluminum chloride and electrodesiccation Outcome: patient tolerated procedure well with no complications   Post-procedure details: wound care instructions given      Return for next scheduled.  I, Germaine Pomfret, CMA, am acting as scribe for Elie Goody, MD.   Documentation: I have reviewed the above documentation for accuracy and completeness, and I agree with the above.  Elie Goody, MD

## 2023-06-04 ENCOUNTER — Ambulatory Visit: Payer: No Typology Code available for payment source | Attending: Cardiology | Admitting: Cardiology

## 2023-06-04 VITALS — BP 113/69 | HR 79 | Ht 71.0 in | Wt 261.0 lb

## 2023-06-04 DIAGNOSIS — I480 Paroxysmal atrial fibrillation: Secondary | ICD-10-CM

## 2023-06-04 DIAGNOSIS — I5022 Chronic systolic (congestive) heart failure: Secondary | ICD-10-CM

## 2023-06-04 DIAGNOSIS — E669 Obesity, unspecified: Secondary | ICD-10-CM | POA: Diagnosis not present

## 2023-06-04 MED ORDER — ENTRESTO 97-103 MG PO TABS: 1.0000 | ORAL_TABLET | Freq: Two times a day (BID) | ORAL | 6 refills | Status: AC

## 2023-06-04 NOTE — Patient Instructions (Addendum)
Medication Changes:  Increase Entresto to 97/103 mg (1 tablet) two times a day.   Lab Work:  Labs done today, your results will be available in MyChart, we will contact you for abnormal readings.  You will have labs done in 10 days. Please come to the medical mall entry before 4:30 pm to have your labs completed.   Testing/Procedures:  Your physician has requested that you have a cardiac MRI. Cardiac MRI uses a computer to create images of your heart as its beating, producing both still and moving pictures of your heart and major blood vessels. For further information please visit [email or web address]. Please follow the instruction sheet given to you today for more information.  Please call this number to reschedule [phone]   Special Instructions // Education:  Do the following things EVERYDAY: Weigh yourself in the morning before breakfast. Write it down and keep it in a log. Take your medicines as prescribed Eat low salt foods--Limit salt (sodium) to 2000 mg per day.  Stay as active as you can everyday Limit all fluids for the day to less than 2 liters (please stay hydrated)   Follow-Up in: Please call in December to schedule your January appointment with Dr. Shirlee Latch.     If you have any questions or concerns before your next appointment please send [place] a message through [place] or call our office at [phone] Monday-Friday 8 am-5 pm.    If you have an urgent need after hours on the weekend please call your Primary Cardiologist or the [facility] in [place] at [phone].   At the [facility], you and your health needs are our priority. We have a designated team specialized in the treatment of Heart Failure. This Care Team includes your primary Heart Failure Specialized Cardiologist (physician), Advanced Practice Providers (APPs- Physician Assistants and Nurse Practitioners), and Pharmacist who all work together to provide you with the  care you need, when you need it.   You may see any of the following providers on your designated Care Team at your next follow up:  Dr. Arvilla Meres Dr. Marca Ancona Dr. Dorthula Nettles Dr. Theresia Bough Tonye Becket, NP Robbie Lis, [place] [address] [place], [place] Brynda Peon, NP [place] Lee, NP Clarisa Kindred, NP Enos Fling, PharmD

## 2023-06-04 NOTE — Progress Notes (Addendum)
PCP: Lynnea Ferrier, MD Cardiology: Dr. Graciela Husbands HF Cardiology: Dr. Shirlee Latch  57 y.o. with history of paroxysmal atrial fibrillation and nonischemic cardiomyopathy was referred for evaluation of CHF by Dr. Berton Mount.  Patient's cardiac history dates back to his 72s when he first began having episodes of paroxysmal atrial fibrillation.  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  He is not on an antiarrhythmic but generally stays in NSR.  He has no family history of atrial fibrillation that he knows of.  He also has had a cardiomyopathy known for a number of years now. Echo in 1/22 showed EF 30-35%. Cath at the [facility] in 7/23 showed mild luminal irregularities.  Most recent echo in 9/24 showed EF 35-40%, mild LV dilation, RV normal, IVC normal. 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  He was found to have moderate OSA on sleep study.   He has significant heart disease in his family but it generally seems to be CAD.  Father and grandfather with MIs in their 40s, uncles with MIs.  However, his son died at 54 from cardiogenic shock and was on the transplant list in [place].  Cause of his cardiomyopathy is unknown.   Recently, patient's meds have been adjusted around.  Coreg was stopped and Toprol XL started, spironolactone stopped and later started back, and Entresto was decreased due to lightheadedness/orthostatic symptoms.  Currently, no orthostatic symptoms and BP is stable.  Patient has fatigue and daytime sleepiness that may be due to OSA, he is awaiting the arrival of his CPAP machine. No exertional dyspnea or chest pain.  He walks and rides a stationary bike for exercise. No orthopnea/PND.  Occasional palpitations with episodes of atrial fibrillation, these do not last long.  Generally notes when he drinks wine or is out in the heat and dehydrated. He is in NSR today.   ECG  (personally reviewed): NSR, LAFB  Labs (7/24): LDL 91 Labs (8/24): K 4.5, creatinine 1.19, hgb 13.1  PMH: 1. Atrial fibrillation: Paroxysmal.  Has been present since he was in his 38s.  Has had 3-4 cardioversions.  2. Prostate cancer: Plan radical prostatectomy in 11/24.  3. H/o throat cancer: Thought to be related to chemical exposure in [employer] in [place].  4. PVCs: Zio monitor (9/24) with 2.3% PVCs, no AF.  5. Gout 6. HTN 7. OSA: Moderate on sleep study.  8. Hyperlipidemia 9. Chronic systolic CHF: Nonischemic cardiomyopathy.   - Echo (1/22): EF 30-35% - [facility], [facility]): EF 44% - LHC (7/23, [facility]): Luminal irregularities only.  - Echo (9/24): EF 35-40%, mild LV dilation, RV normal, IVC normal.   SH: Works for [employer] in [place], travels around the state.  Occasional ETOH, not heavy.  No smoking.  No drugs.   FH: Father with MI in 35s, grandfather with MI in 45s, uncles with MIs; son died at 39 with cardiogenic shock, cause uncertain, had been on transplant list.  ROS: All systems reviewed and negative except as per HPI.   Current Outpatient Medications  Medication Sig Dispense Refill   ALLOPURINOL PO Take 400 mg by mouth daily.     b complex vitamins capsule Take 1 capsule by mouth daily.     Coenzyme Q10 (CO Q-10) 100 MG CAPS Take 1 capsule by mouth daily.     cyclobenzaprine (FLEXERIL) 10 MG tablet  Take by mouth daily.     Docusate Sodium 100 MG capsule Take 100 mg by mouth daily. (Stool softener)     empagliflozin (JARDIANCE) 25 MG TABS tablet Take 12.5 mg by mouth daily.     fenofibrate 160 MG tablet Take 1 tablet by mouth daily.     FLUOXETINE HCL PO Take 80 mg by mouth daily.     Ibuprofen 200 MG CAPS Take 800 mg by mouth 2 (two) times daily as needed (pain).     icosapent Ethyl (VASCEPA) 1 g capsule Take 4 capsules (4 g total) by mouth 2 (two) times daily. 720 capsule 3   LEVOTHYROXINE SODIUM PO Take 224 mcg by mouth daily.     Melatonin 5 MG CAPS Take 20  mg by mouth at bedtime. Takes 2 capsules po at bedtime for sleep.     metoprolol succinate (TOPROL-XL) 50 MG 24 hr tablet Take 1 tablet (50 mg total) by mouth at bedtime. Take with or immediately following a meal. 90 tablet 3   mirtazapine (REMERON) 30 MG tablet Take 30 mg by mouth daily.     Multiple Vitamin (MULTIVITAMIN) tablet Take 1 tablet by mouth daily.     mupirocin ointment (BACTROBAN) 2 % Apply 1 application topically daily. (Patient taking differently: Apply 1 application  topically as needed.) 30 g 0   Niacinamide 500 MG TBCR Take 1 tablet by mouth in the morning and at bedtime. For skin cancer prevention. 60 tablet 11   Polypodium Leucotomos (HELIOCARE) 240 MG CAPS Take 240 mg by mouth every evening.     Probiotic Product (CULTURELLE PROBIOTICS PO) Take 200 mg by mouth daily.     rivaroxaban (XARELTO) 20 MG TABS tablet Take 1 tablet (20 mg total) by mouth daily. 30 tablet 5   rosuvastatin (CRESTOR) 40 MG tablet Take 1 tablet (40 mg total) by mouth daily.     sacubitril-valsartan (ENTRESTO) 97-103 MG Take 1 tablet by mouth 2 (two) times daily. 60 tablet 6   spironolactone (ALDACTONE) 25 MG tablet Take 1 tablet (25 mg total) by mouth daily. 90 tablet 3   THERATEARS 0.25 % SOLN Place 1 drop into both eyes as needed (dry eyes).     Turmeric 500 MG TABS Take by mouth daily.     vitamin C (ASCORBIC ACID) 500 MG tablet Take 500 mg by mouth daily.     No current facility-administered medications for this visit.   BP 113/69   Pulse 79   Ht 5\' 11"  (1.803 m)   Wt 261 lb (118.4 kg)   SpO2 96%   BMI 36.40 kg/m  General: NAD Neck: Thick. No JVD, no thyromegaly or thyroid nodule.  Lungs: Clear to auscultation bilaterally with normal respiratory effort. CV: Nondisplaced PMI.  Heart regular S1/S2, no S3/S4, no murmur.  No peripheral edema.  No carotid bruit.  Normal pedal pulses.  Abdomen: Soft, nontender, no hepatosplenomegaly, no distention.  Skin: Intact without lesions or rashes.   Neurologic: Alert and oriented x 3.  Psych: Normal affect. Extremities: No clubbing or cyanosis.  HEENT: Normal.   Assessment/Plan: 1. Chronic systolic CHF: Nonischemic cardiomyopathy.  Cath in 7/23 with mild luminal irregularities.  Last echo in 9/24 showed EF 35-40%, mild LV dilation, RV normal, IVC normal. This is consistent with prior echoes.  Zio monitor in 9/24 showed 2.3% PVCs, not enough to contribute to cardiomyopathy.  He is not a heavy drinker, no drugs.  It is possible that he could have a familial cardiomyopathy  based on 70 year old son's death from cardiomyopathy with cardiogenic shock. He is not volume overloaded on exam, NYHA class I symptoms.  Medication titration has been limited by orthostatic symptoms.  - He has changed his beta blocker to Toprol XL 50 mg at bedtime.  This has helped with orthostatic symptoms.  - BP stable today, I will try him once more on Entresto 97/103 bid.  Check BMET/BNP today and BMET in 10 days.  He knows to stay well-hydrated with this increase.  - Continue spironolactone 25 mg at bedtime.  - Continue Jardiance 12.5 mg daily.  - I will arrange for cardiac MRI to assess for infiltrative disease or signs of prior myocarditis.  - Given family history, he has an appointment scheduled with Sidney Ace for genetic counseling, I would favor genetic testing for familial cardiomyopathies.  - EF appears outside ICD range and he has a narrow QRS so not CRT candidate.  2. Atrial fibrillation: Paroxysmal.  He has occasional episodes that do not seem to last long. Zio monitor in 9/24 showed no atrial fibrillation.  However, AF was first detected in his 85s.  He does not know of any family history of AF.  - Continue Xarelto 20 mg daily.  - He is on Coreg.  - If AF burden increases, he should be a candidate for AF ablation.  3. Obesity: He has been started on semaglutide by the [place].  4. OSA: Awaiting CPAP.  5. Prostate cancer: Plan for robotic prostatectomy in  11/24.  I think that he is of reasonable risk to undergo this procedure.  Will need to hold Xarelto 3 days prior.   Followup in 3 months.   Marca Ancona 06/04/2023

## 2023-06-05 ENCOUNTER — Encounter: Payer: Self-pay | Admitting: Cardiology

## 2023-06-05 LAB — BRAIN NATRIURETIC PEPTIDE: BNP: 32.8 pg/mL (ref 0.0–100.0)

## 2023-06-05 LAB — BASIC METABOLIC PANEL WITH GFR
BUN/Creatinine Ratio: 21 — ABNORMAL HIGH (ref 9–20)
BUN: 26 mg/dL — ABNORMAL HIGH (ref 6–24)
CO2: 22 mmol/L (ref 20–29)
Calcium: 10 mg/dL (ref 8.7–10.2)
Chloride: 98 mmol/L (ref 96–106)
Creatinine, Ser: 1.24 mg/dL (ref 0.76–1.27)
Glucose: 81 mg/dL (ref 70–99)
Potassium: 4.4 mmol/L (ref 3.5–5.2)
Sodium: 139 mmol/L (ref 134–144)
eGFR: 68 mL/min/1.73

## 2023-06-10 ENCOUNTER — Encounter: Payer: Self-pay | Admitting: Dermatology

## 2023-06-18 ENCOUNTER — Ambulatory Visit
Payer: No Typology Code available for payment source | Attending: Student in an Organized Health Care Education/Training Program | Admitting: Student in an Organized Health Care Education/Training Program

## 2023-06-18 DIAGNOSIS — M47816 Spondylosis without myelopathy or radiculopathy, lumbar region: Secondary | ICD-10-CM

## 2023-06-18 DIAGNOSIS — M48062 Spinal stenosis, lumbar region with neurogenic claudication: Secondary | ICD-10-CM | POA: Diagnosis not present

## 2023-06-18 DIAGNOSIS — G894 Chronic pain syndrome: Secondary | ICD-10-CM

## 2023-06-18 DIAGNOSIS — M5416 Radiculopathy, lumbar region: Secondary | ICD-10-CM | POA: Diagnosis not present

## 2023-06-18 NOTE — Progress Notes (Signed)
Patient: [name]  Service Category: E/M  Provider: Edward Jolly, MD  DOB: [birth year]  DOS: 06/18/2023  Location: Office  MRN: [id number]  Setting: Ambulatory outpatient  Referring Provider: Lynnea Ferrier, MD  Type: Established Patient  Specialty: Interventional Pain Management  PCP: Lynnea Ferrier, MD  Location: Remote location  Delivery: TeleHealth     Virtual Encounter - Pain Management PROVIDER NOTE: Information contained herein reflects review and annotations entered in association with encounter. Interpretation of such information and data should be left to medically-trained personnel. Information provided to patient can be located elsewhere in the medical record under "Patient Instructions". Document created using STT-dictation technology, any transcriptional errors that may result from process are unintentional.    Contact & Pharmacy Preferred: [phone] Home: [phone] (home) Mobile: [phone] (mobile) E-mail: [email or web address]  [id number] [place], [place] - [address] [place] [address] Phone: [phone] Fax: [phone]  [id number] - [place], [place] - [address] AT [facility] [address] [facility] [address] [place] [address] Phone: [phone] Fax: [phone]  [facility] - [place], [place] - [address] [place] [address] Phone: [phone] Fax: [phone]   Pre-screening  Mr. Kizzie Bane offered "in-person" vs "virtual" encounter. He indicated preferring virtual for this encounter.   Reason COVID-19*  Social distancing based on CDC and AMA recommendations.   I contacted [name] on 06/18/2023 via telephone.      I clearly identified myself as Edward Jolly, MD. I verified that I was speaking with the correct person using two identifiers (Name: Geddy Paluzzi, and date of birth: Nov 13, 1965).  Consent I  sought verbal advanced consent from [name] for virtual visit interactions. I informed Mr. Stockberger of possible security and privacy concerns, risks, and limitations associated with providing "not-in-person" medical evaluation and management services. I also informed Mr. Savignano of the availability of "in-person" appointments. Finally, I informed him that there would be a charge for the virtual visit and that he could be  personally, fully or partially, financially responsible for it. Mr. Monett expressed understanding and agreed to proceed.   Historic Elements   Mr. Matson Donini is a 57 y.o. year old, male patient evaluated today after our last contact on 04/24/2023. Mr. Arnwine  has a past medical history of Anxiety, Arthritis, Atrial fibrillation (HCC) (1989), Basal cell carcinoma (06/23/2020), Basal cell carcinoma (12/28/2020), Basal cell carcinoma (04/01/2023), Basal cell carcinoma (04/01/2023), Basal cell carcinoma (BCC) (02/14/2022), Bicuspid aortic valve, Cardiomyopathy (08/2009), Depression, GERD (gastroesophageal reflux disease), Gout, Heat stroke (1998), Hyperlipidemia, Hypertension, Seizures (HCC) (1998), Squamous cell carcinoma in situ (04/01/2023), and throat cancer. He also  has a past surgical history that includes Cardioversion (1989); Cardiac catheterization (03/2010); and Total knee arthroplasty (Left). Mr. Stedman has a current medication list which includes the following prescription(s): allopurinol, b complex vitamins, co q-10, cyclobenzaprine, docusate sodium, empagliflozin, fenofibrate, fluoxetine hcl, ibuprofen, icosapent ethyl, levothyroxine sodium, melatonin, metoprolol succinate, mirtazapine, multivitamin, mupirocin ointment, niacinamide er, heliocare, probiotic product, rivaroxaban, rosuvastatin, entresto, spironolactone, theratears, turmeric, and ascorbic acid. He  reports that he has never smoked. He has never used smokeless tobacco. He reports current alcohol use of  about 2.0 - 3.0 standard drinks of alcohol per week. He reports that he does not use drugs. Mr. Stec has No Known Allergies.  BMI: Estimated body mass index is 36.4 kg/m as calculated from the  following:   Height as of 06/04/23: 5\' 11"  (1.803 m).   Weight as of 06/04/23: 261 lb (118.4 kg). Last encounter: 04/24/2023. Last procedure: 03/13/2023.  HPI  Today, he is being contacted for  review L-MRI    Laboratory Chemistry Profile   Renal Lab Results  Component Value Date   BUN 26 (H) 06/04/2023   CREATININE 1.24 06/04/2023   BCR 21 (H) 06/04/2023   GFR 77.83 06/26/2016   GFRAA >60 10/13/2019   GFRNONAA >60 10/13/2019    Hepatic Lab Results  Component Value Date   AST 34 05/09/2017   ALT 34 05/09/2017   ALBUMIN 4.5 05/09/2017   ALKPHOS 66 05/09/2017   HCVAB NEG 10/29/2008   AMYLASE 41 11/06/2008   LIPASE 19 11/06/2008    Electrolytes Lab Results  Component Value Date   NA 139 06/04/2023   K 4.4 06/04/2023   CL 98 06/04/2023   CALCIUM 10.0 06/04/2023   MG 2.1 04/16/2013   PHOS 2.5 06/13/2010    Bone No results found for: "VD25OH", "VD125OH2TOT", "NW2956OZ3", "YQ6578IO9", "25OHVITD1", "25OHVITD2", "25OHVITD3", "TESTOFREE", "TESTOSTERONE"  Inflammation (CRP: Acute Phase) (ESR: Chronic Phase) Lab Results  Component Value Date   CRP 2.7 07/27/2014   ESRSEDRATE 9 07/27/2014         Note: Above Lab results reviewed.  Imaging   Narrative & Impression  CLINICAL DATA:  Low back pain for 6 weeks.  No known injury.   EXAM: MRI LUMBAR SPINE WITHOUT CONTRAST   TECHNIQUE: Multiplanar, multisequence MR imaging of the lumbar spine was performed. No intravenous contrast was administered.   COMPARISON:  01/13/2016   FINDINGS: Segmentation:  Standard.   Alignment: Minimal grade 1 anterolisthesis of L4 on L5 secondary to facet disease.   Vertebrae: No acute fracture, evidence of discitis, or aggressive bone lesion. Mild marrow edema in the L4 and L5  pedicles bilaterally.   Conus medullaris and cauda equina: Conus extends to the T12-L1 level. Conus and cauda equina appear normal.   Paraspinal and other soft tissues: No acute paraspinal abnormality.   Disc levels:   Disc spaces: Disc spaces are preserved.   T12-L1: No significant disc bulge. No neural foraminal stenosis. No central canal stenosis.   L1-L2: No significant disc bulge. No neural foraminal stenosis. No central canal stenosis.   L2-L3: Mild broad-based disc bulge. Mild bilateral facet arthropathy. Mild epidural lipomatosis unchanged. Mild spinal stenosis. No foraminal stenosis.   L3-L4: Mild broad-based disc bulge. Moderate bilateral facet arthropathy with ligamentum flavum infolding. Mild epidural lipomatosis. Mild-moderate spinal stenosis. Mild right and moderate left foraminal stenosis.   L4-L5: Mild broad-based disc bulge. Moderate bilateral facet arthropathy. Moderate-severe spinal stenosis. Mild-moderate bilateral foraminal stenosis.   L5-S1: Mild broad-based disc bulge with a small central shallow disc protrusion and annular fissure. Moderate right and mild left facet arthropathy. No spinal stenosis. Moderate bilateral foraminal stenosis.   IMPRESSION: 1. Lumbar spine spondylosis as described above which has mildly progressed compared with 01/13/2016. 2. At L4-5 there is a mild broad-based disc bulge. Moderate bilateral facet arthropathy. Moderate-severe spinal stenosis. Mild-moderate bilateral foraminal stenosis. 3. At L3-4 there is a mild broad-based disc bulge. Moderate bilateral facet arthropathy with ligamentum flavum infolding. Mild epidural lipomatosis. Mild-moderate spinal stenosis. Mild right and moderate left foraminal stenosis. 4. At L5-S1 there is a mild broad-based disc bulge with a small central shallow disc protrusion. Moderate right and mild left facet arthropathy. Moderate bilateral foraminal stenosis. 5. No acute osseous injury  of the lumbar spine.  Assessment  The primary encounter diagnosis was Lumbar radicular pain. Diagnoses of Spinal stenosis, lumbar region, with neurogenic claudication, Lumbar facet arthropathy, and Chronic pain syndrome were also pertinent to this visit.  Plan of Care  1. Lumbar radicular pain - Lumbar Epidural Injection; Future  2. Spinal stenosis, lumbar region, with neurogenic claudication - Lumbar Epidural Injection; Future  3. Lumbar facet arthropathy  4. Chronic pain syndrome - Lumbar Epidural Injection; Future    Orders:  Orders Placed This Encounter  Procedures   Lumbar Epidural Injection    Standing Status:   Future    Standing Expiration Date:   09/18/2023    Scheduling Instructions:     Procedure: Interlaminar Lumbar Epidural Steroid injection (LESI)            Laterality: Midline     Sedation: without     Timeframe: ASAA    Order Specific Question:   Where will this procedure be performed?    Answer:   [facility]   Follow-up plan:   Return in about 22 days (around 07/10/2023) for L4/5 ESI, in clinic NS.      Status post right L3, L4, L5 RFA on 10/20/2018, status post left L3, L4, L5 RFA on 02/23/2019.  Helpful in reducing his axial low back pain and improving his functional status.  Can repeat PRN after 6 months. L3/4 ESI 4/5/211 01/11/20 for low back + radiating hip pain, hip xray and SI joint xray largely unremarkable         Recent Visits Date Type Provider Dept  04/24/23 Office Visit Edward Jolly, MD [facility]  Showing recent visits within past 90 days and meeting all other requirements Today's Visits Date Type Provider Dept  06/18/23 Office Visit Edward Jolly, MD [facility]  Showing today's visits and meeting all other requirements Future Appointments No visits were found meeting these conditions. Showing future appointments within next 90 days and meeting all other requirements  I discussed the assessment and  treatment plan with the patient. The patient was provided an opportunity to ask questions and all were answered. The patient agreed with the plan and demonstrated an understanding of the instructions.  Patient advised to call back or seek an in-person evaluation if the symptoms or condition worsens.  Duration of encounter: 15 minutes.  Note by: Edward Jolly, MD Date: 06/18/2023; Time: 4:01 PM

## 2023-06-26 HISTORY — PX: PROSTATE SURGERY: SHX751

## 2023-07-22 ENCOUNTER — Encounter (HOSPITAL_COMMUNITY): Payer: Self-pay

## 2023-07-24 ENCOUNTER — Ambulatory Visit: Admission: RE | Admit: 2023-07-24 | Payer: BC Managed Care – PPO | Source: Ambulatory Visit

## 2023-07-29 ENCOUNTER — Other Ambulatory Visit: Payer: Self-pay | Admitting: Physician Assistant

## 2023-07-29 DIAGNOSIS — I429 Cardiomyopathy, unspecified: Secondary | ICD-10-CM

## 2023-08-02 ENCOUNTER — Encounter: Payer: Self-pay | Admitting: Genetic Counselor

## 2023-08-14 ENCOUNTER — Inpatient Hospital Stay: Admission: RE | Admit: 2023-08-14 | Payer: BC Managed Care – PPO | Source: Ambulatory Visit

## 2023-08-14 ENCOUNTER — Ambulatory Visit: Admission: RE | Admit: 2023-08-14 | Payer: No Typology Code available for payment source | Source: Ambulatory Visit

## 2023-08-20 ENCOUNTER — Encounter: Payer: No Typology Code available for payment source | Admitting: Genetic Counselor

## 2023-08-21 ENCOUNTER — Ambulatory Visit
Payer: No Typology Code available for payment source | Admitting: Student in an Organized Health Care Education/Training Program

## 2023-09-05 ENCOUNTER — Ambulatory Visit: Payer: Self-pay | Admitting: Internal Medicine

## 2023-09-10 DIAGNOSIS — R7303 Prediabetes: Secondary | ICD-10-CM | POA: Insufficient documentation

## 2023-09-11 ENCOUNTER — Ambulatory Visit
Payer: No Typology Code available for payment source | Admitting: Student in an Organized Health Care Education/Training Program

## 2023-09-23 ENCOUNTER — Encounter: Payer: Self-pay | Admitting: Dermatology

## 2023-09-23 ENCOUNTER — Ambulatory Visit (INDEPENDENT_AMBULATORY_CARE_PROVIDER_SITE_OTHER): Payer: 59 | Admitting: Dermatology

## 2023-09-23 DIAGNOSIS — L578 Other skin changes due to chronic exposure to nonionizing radiation: Secondary | ICD-10-CM | POA: Diagnosis not present

## 2023-09-23 DIAGNOSIS — L814 Other melanin hyperpigmentation: Secondary | ICD-10-CM

## 2023-09-23 DIAGNOSIS — D229 Melanocytic nevi, unspecified: Secondary | ICD-10-CM

## 2023-09-23 DIAGNOSIS — Z85828 Personal history of other malignant neoplasm of skin: Secondary | ICD-10-CM

## 2023-09-23 DIAGNOSIS — L570 Actinic keratosis: Secondary | ICD-10-CM

## 2023-09-23 DIAGNOSIS — Z86007 Personal history of in-situ neoplasm of skin: Secondary | ICD-10-CM

## 2023-09-23 DIAGNOSIS — W908XXA Exposure to other nonionizing radiation, initial encounter: Secondary | ICD-10-CM

## 2023-09-23 DIAGNOSIS — L309 Dermatitis, unspecified: Secondary | ICD-10-CM

## 2023-09-23 DIAGNOSIS — L821 Other seborrheic keratosis: Secondary | ICD-10-CM

## 2023-09-23 DIAGNOSIS — D1801 Hemangioma of skin and subcutaneous tissue: Secondary | ICD-10-CM

## 2023-09-23 DIAGNOSIS — Z1283 Encounter for screening for malignant neoplasm of skin: Secondary | ICD-10-CM | POA: Diagnosis not present

## 2023-09-23 DIAGNOSIS — Z7189 Other specified counseling: Secondary | ICD-10-CM

## 2023-09-23 MED ORDER — HYDROCORTISONE 2.5 % EX CREA: TOPICAL_CREAM | CUTANEOUS | 3 refills | Status: AC

## 2023-09-23 MED ORDER — FLUOROURACIL 5 % EX CREA: TOPICAL_CREAM | Freq: Two times a day (BID) | CUTANEOUS | 2 refills | Status: DC

## 2023-09-23 NOTE — Patient Instructions (Addendum)
- Start 5-fluorouracil/calcipotriene cream twice a day for 4-5 days or until red and irritated to affected areas including nose, sideburns, and temples. Patient advised they will receive a phone call to purchase the medication online and have it sent to their home. Patient provided with handout reviewing treatment course and side effects and advised to call or message [place] on MyChart with any concerns.  Reviewed course of treatment and expected reaction.  Patient advised to expect inflammation and crusting and advised that erosions are possible.  Patient advised to be diligent with sun protection during and after treatment. Counseled to keep medication out of reach of children and pets.    [facility] [address] [place], [place] [address]  Phone: [phone] TOLL-FREE: [phone]    For belly button: Use Hydrocortisone 2.5% cream twice a day until clear to affected area until clear.   Topical steroids (such as triamcinolone, fluocinolone, fluocinonide, mometasone, clobetasol, halobetasol, betamethasone, hydrocortisone) can cause thinning and lightening of the skin if they are used for too long in the same area. Your physician has selected the right strength medicine for your problem and area affected on the body. Please use your medication only as directed by your physician to prevent side effects.     Due to recent changes in healthcare laws, you may see results of your pathology and/or laboratory studies on MyChart before the doctors have had a chance to review them. We understand that in some cases there may be results that are confusing or concerning to you. Please understand that not all results are received at the same time and often the doctors may need to interpret multiple results in order to provide you with the best plan of care or course of treatment. Therefore, we ask that you please give [place] 2 business days to thoroughly review all your results before contacting the  office for clarification. Should we see a critical lab result, you will be contacted sooner.   If You Need Anything After Your Visit  If you have any questions or concerns for your doctor, please call our main line at [phone] and press option 4 to reach your doctor's medical assistant. If no one answers, please leave a voicemail as directed and we will return your call as soon as possible. Messages left after 4 pm will be answered the following business day.   You may also send [place] a message via MyChart. We typically respond to MyChart messages within 1-2 business days.  For prescription refills, please ask your pharmacy to contact our office. Our fax number is [phone].  If you have an urgent issue when the clinic is closed that cannot wait until the next business day, you can page your doctor at the number below.    Please note that while we do our best to be available for urgent issues outside of office hours, we are not available 24/7.   If you have an urgent issue and are unable to reach [place], you may choose to seek medical care at your doctor's office, retail clinic, urgent care center, or emergency room.  If you have a medical emergency, please immediately call 911 or go to the emergency department.  Pager Numbers  - Dr. Gwen Pounds: [phone]  - Dr. Roseanne Reno: [phone]  - Dr. Katrinka Blazing: [phone]   In the event of inclement weather, please call our main line at [phone] for an update on the status of any delays or closures.  Dermatology Medication Tips: Please keep the boxes that topical  medications come in in order to help keep track of the instructions about where and how to use these. Pharmacies typically print the medication instructions only on the boxes and not directly on the medication tubes.   If your medication is too expensive, please contact our office at [phone] option 4 or send [place] a message through MyChart.   We are unable to tell what your  co-pay for medications will be in advance as this is different depending on your insurance coverage. However, we may be able to find a substitute medication at lower cost or fill out paperwork to get insurance to cover a needed medication.   If a prior authorization is required to get your medication covered by your insurance company, please allow [place] 1-2 business days to complete this process.  Drug prices often vary depending on where the prescription is filled and some pharmacies may offer cheaper prices.  The website www.goodrx.com contains coupons for medications through different pharmacies. The prices here do not account for what the cost may be with help from insurance (it may be cheaper with your insurance), but the website can give you the price if you did not use any insurance.  - You can print the associated coupon and take it with your prescription to the pharmacy.  - You may also stop by our office during regular business hours and pick up a GoodRx coupon card.  - If you need your prescription sent electronically to a different pharmacy, notify our office through [facility] or by phone at [phone] option 4.     Si Usted Necesita Algo Despus de Su Visita  Tambin puede enviarnos un mensaje a travs de Clinical cytogeneticist. Por lo general respondemos a los mensajes de MyChart en el transcurso de 1 a 2 das hbiles.  Para renovar recetas, por favor pida a su farmacia que se ponga en contacto con nuestra oficina. Annie Sable de fax es [place] [phone].  Si tiene un asunto urgente cuando la clnica est cerrada y que no puede esperar hasta el siguiente da hbil, puede llamar/localizar a su doctor(a) al nmero que aparece a continuacin.   Por favor, tenga en cuenta que aunque hacemos todo lo posible para estar disponibles para asuntos urgentes fuera del horario de [place], no estamos disponibles las 24 horas del da, los 7 [address]  [address] de la [place].   Si tiene un problema urgente y no  puede comunicarse con nosotros, puede optar por buscar atencin mdica  en el consultorio de su doctor(a), en una clnica privada, en un centro de atencin urgente o en una sala de emergencias.  Si tiene Engineer, drilling, por favor llame inmediatamente al 911 o vaya a la sala de emergencias.  Nmeros de bper  - Dr. Gwen Pounds: [phone]  - Dra. Roseanne Reno: [phone]  - Dr. Katrinka Blazing: [phone]   En caso de inclemencias del tiempo, por favor llame a Lacy Duverney principal al [phone] para una actualizacin sobre el [place] de cualquier retraso o cierre.  Consejos para la medicacin en dermatologa: Por favor, guarde las cajas en las que vienen los medicamentos de uso tpico para ayudarle a seguir las instrucciones sobre dnde y cmo usarlos. Las farmacias generalmente imprimen las instrucciones del medicamento slo en las cajas y no directamente en los tubos del [place].   Si su medicamento es muy caro, por favor, pngase en contacto con Rolm Gala llamando al [phone] y presione la opcin 4 o envenos un mensaje a travs de Clinical cytogeneticist.   No  podemos decirle cul ser su copago por los medicamentos por adelantado ya que esto es diferente dependiendo de la cobertura de su seguro. Sin embargo, es posible que podamos encontrar un medicamento sustituto a Audiological scientist un formulario para que el seguro cubra el medicamento que se considera necesario.   Si se requiere una autorizacin previa para que su compaa de seguros [place] su medicamento, por favor permtanos de 1 a 2 das hbiles para completar [address]  Los precios de los medicamentos varan con frecuencia dependiendo del Environmental consultant de dnde se surte la receta y alguna farmacias pueden ofrecer precios ms baratos.  El sitio web www.goodrx.com tiene cupones para medicamentos de Health and safety inspector. Los precios aqu no tienen en cuenta lo que podra costar con la ayuda del seguro (puede ser ms barato con su seguro),  pero el sitio web puede darle el precio si no utiliz Tourist information centre manager.  - Puede imprimir el cupn correspondiente y llevarlo con su receta a la farmacia.  - Tambin puede pasar por nuestra oficina durante el horario de atencin regular y Education officer, museum una tarjeta de cupones de GoodRx.  - Si necesita que su receta se enve electrnicamente a una farmacia diferente, informe a nuestra oficina a travs de MyChart de [place] o por telfono llamando al [phone] y presione la opcin 4.

## 2023-09-23 NOTE — Progress Notes (Signed)
Follow-Up Visit   Subjective  [name] is a 58 y.o. male who presents for the following: Skin Cancer Screening and Full Body Skin Exam, Hx BCC, Hx SCCIS  The patient presents for Total-Body Skin Exam (TBSE) for skin cancer screening and mole check. The patient has spots, moles and lesions to be evaluated, some may be new or changing and the patient may have concern these could be cancer.   The following portions of the chart were reviewed this encounter and updated as appropriate: medications, allergies, medical history  Review of Systems:  No other skin or systemic complaints except as noted in HPI or Assessment and Plan.  Objective  Well appearing patient in no apparent distress; mood and affect are within normal limits.  A full examination was performed including scalp, head, eyes, ears, nose, lips, neck, chest, axillae, abdomen, back, buttocks, bilateral upper extremities, bilateral lower extremities, hands, feet, fingers, toes, fingernails, and toenails. All findings within normal limits unless otherwise noted below.   Relevant physical exam findings are noted in the Assessment and Plan.  Left Nasal Sidewall Pink scaly macules  Assessment & Plan   SKIN CANCER SCREENING PERFORMED TODAY.  ACTINIC DAMAGE - Chronic condition, secondary to cumulative UV/sun exposure - diffuse scaly erythematous macules with underlying dyspigmentation - Recommend daily broad spectrum sunscreen SPF 30+ to sun-exposed areas, reapply every 2 hours as needed.  - Staying in the shade or wearing long sleeves, sun glasses (UVA+UVB protection) and wide brim hats (4-inch brim around the entire circumference of the hat) are also recommended for sun protection.  - Call for new or changing lesions.  LENTIGINES, SEBORRHEIC KERATOSES, HEMANGIOMAS - Benign normal skin lesions - Benign-appearing - Call for any changes  MELANOCYTIC NEVI - Tan-brown and/or pink-flesh-colored symmetric macules and  papules - Benign appearing on exam today - Observation - Call clinic for new or changing moles - Recommend daily use of broad spectrum spf 30+ sunscreen to sun-exposed areas.   HISTORY OF BASAL CELL CARCINOMA OF THE SKIN Multiple, see history - No evidence of recurrence today - Recommend regular full body skin exams - Recommend daily broad spectrum sunscreen SPF 30+ to sun-exposed areas, reapply every 2 hours as needed.  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  - Call if any new or changing lesions are noted between office visits    AK (ACTINIC KERATOSIS) Left Nasal Sidewall ACTINIC DAMAGE WITH PRECANCEROUS ACTINIC KERATOSES Counseling for Topical Chemotherapy Management: Patient exhibits: - Severe, confluent actinic changes with pre-cancerous actinic keratoses that is secondary to cumulative UV radiation exposure over time - Condition that is severe; chronic, not at goal. - diffuse scaly erythematous macules and papules with underlying dyspigmentation - Discussed Prescription "Field Treatment" topical Chemotherapy for Severe, Chronic Confluent Actinic Changes with Pre-Cancerous Actinic Keratoses Field treatment involves treatment of an entire area of skin that has confluent Actinic Changes (Sun/ Ultraviolet light damage) and PreCancerous Actinic Keratoses by method of PhotoDynamic Therapy (PDT) and/or prescription Topical Chemotherapy agents such as 5-fluorouracil, 5-fluorouracil/calcipotriene, and/or imiquimod.  The purpose is to decrease the number of clinically evident and subclinical PreCancerous lesions to  prevent progression to development of skin cancer by chemically destroying early precancer changes that may or may not be visible.  It has been shown to reduce the risk of developing skin cancer in the treated area. As a result of treatment, redness, scaling, crusting, and open sores may occur during treatment course. One or more than one of these methods may be used and may have to be used several times to control, suppress and eliminate the PreCancerous changes. Discussed treatment course, expected reaction, and possible side effects. - Recommend daily broad spectrum sunscreen SPF 30+ to sun-exposed areas, reapply every 2 hours as needed.  - Staying in the shade or wearing long sleeves, sun glasses (UVA+UVB protection) and wide brim hats (4-inch brim around the entire circumference of the hat) are also recommended. - Call for new or changing lesions.   Discussed treatment options with LN2 or cream to areas. Pt opted for cream.  - Start 5-fluorouracil/calcipotriene cream twice a day for 4-5 days or until red and irritated to affected areas including nose and temples. Patient advised they will receive a phone call to purchase the medication online and have it sent to their home. Patient provided with handout reviewing treatment course and side effects and advised to call or message [place] on MyChart with any concerns. MULTIPLE BENIGN NEVI   LENTIGINES   ACTINIC ELASTOSIS   SEBORRHEIC KERATOSES   CHERRY ANGIOMA      Dermatitis at umbilicus Exam: crusted pink plaque in inferior umbilicus. Irritated per patient  Treatment: Start HC 2.5% cream BID until clear to aa. Send message on MyChart if not improved after 2 weeks  Topical steroids (such as triamcinolone, fluocinolone, fluocinonide, mometasone, clobetasol, halobetasol, betamethasone, hydrocortisone) can cause thinning and lightening of the skin if they are used for too long in the same area. Your physician has selected the right strength  medicine for your problem and area affected on the body. Please use your medication only as directed by your physician to prevent side effects.     Return in about 6 months (around 03/22/2024) for w/ Dr. Katrinka Blazing, TBSE, Hx SCCIS, Hx BCC.  Wynonia Lawman, CMA, am acting as scribe for Elie Goody, MD .   Documentation: I have reviewed the above documentation for accuracy and completeness, and I agree with the above.  Elie Goody, MD

## 2023-09-25 ENCOUNTER — Ambulatory Visit
Admission: RE | Admit: 2023-09-25 | Discharge: 2023-09-25 | Disposition: A | Discharge status: Home / Self Care | Payer: No Typology Code available for payment source | Source: Ambulatory Visit | Attending: Physician Assistant | Admitting: Physician Assistant

## 2023-09-25 ENCOUNTER — Other Ambulatory Visit: Payer: Self-pay | Admitting: Physician Assistant

## 2023-09-25 DIAGNOSIS — I429 Cardiomyopathy, unspecified: Secondary | ICD-10-CM | POA: Insufficient documentation

## 2023-09-25 MED ORDER — GADOBUTROL 1 MMOL/ML IV SOLN
15.0000 mL | Freq: Once | INTRAVENOUS | Status: AC | PRN
  Administered 2023-09-25: 15 mL via INTRAVENOUS

## 2023-09-26 ENCOUNTER — Ambulatory Visit: Payer: 59 | Admitting: Podiatry

## 2023-09-26 ENCOUNTER — Ambulatory Visit (INDEPENDENT_AMBULATORY_CARE_PROVIDER_SITE_OTHER): Payer: 59

## 2023-09-26 DIAGNOSIS — S90212A Contusion of left great toe with damage to nail, initial encounter: Secondary | ICD-10-CM | POA: Diagnosis not present

## 2023-09-26 NOTE — Progress Notes (Signed)
Subjective:  Patient ID: [name], male    DOB: 1966/06/30,  MRN: [id number]  Chief Complaint  Patient presents with   Toe Injury    Left big toe injury pt stated that he dropped a weight on his foot about 2 days ago     58 y.o. male presents with the above complaint.  Patient presents with contusion to the left great toe.  Patient states he dropped a weight on it.  He states that happened about 2 days ago is progressive and worse when I get evaluated is not a diabetic.  He is currently on blood thinner pain scale is 5 out of 10 dull aching nature.  He has some complaining neuropathy.  Denies any other acute complaints.   Review of Systems: Negative except as noted in the HPI. Denies N/V/F/Ch.  Past Medical History:  Diagnosis Date   Anxiety    Arthritis    gout   Atrial fibrillation (HCC) 1989   Basal cell carcinoma 06/23/2020   Upper mid back. Nodular.   Basal cell carcinoma 12/28/2020   Right upper arm, superficial, [facility] 03/07/2021   Basal cell carcinoma 04/01/2023   Left upper lateral arm. Superficial and nodular. [facility] 06/03/23   Basal cell carcinoma 04/01/2023   Right upper back. Superficial and nodular. 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 Hypertrophic. ED[facility] 06/03/23   throat cancer     Current Outpatient Medications:    ALLOPURINOL PO, Take 400 mg by mouth daily., Disp: , Rfl:    b complex vitamins capsule, Take 1 capsule by mouth daily., Disp: , Rfl:    Coenzyme Q10  (CO Q-10) 100 MG CAPS, Take 1 capsule by mouth daily., Disp: , Rfl:    cyclobenzaprine (FLEXERIL) 10 MG tablet, Take by mouth daily., Disp: , Rfl:    Docusate Sodium 100 MG capsule, Take 100 mg by mouth daily. (Stool softener), Disp: , Rfl:    empagliflozin (JARDIANCE) 25 MG TABS tablet, Take 12.5 mg by mouth daily., Disp: , Rfl:    fenofibrate 160 MG tablet, Take 1 tablet by mouth daily., Disp: , Rfl:    fluorouracil (EFUDEX) 5 % cream, Apply topically 2 (two) times daily., Disp: 30 g, Rfl: 2   FLUOXETINE HCL PO, Take 80 mg by mouth daily., Disp: , Rfl:    hydrocortisone 2.5 % cream, Apply BID to aa, until clear., Disp: 28 g, Rfl: 3   Ibuprofen 200 MG CAPS, Take 800 mg by mouth 2 (two) times daily as needed (pain)., Disp: , Rfl:    icosapent Ethyl (VASCEPA) 1 g capsule, Take 4 capsules (4 g total) by mouth 2 (two) times daily., Disp: 720 capsule, Rfl: 3   LEVOTHYROXINE SODIUM PO, Take 224 mcg by mouth daily., Disp: , Rfl:    Melatonin 5 MG CAPS, Take 20 mg by mouth at bedtime. Takes 2 capsules po at bedtime for  sleep., Disp: , Rfl:    metoprolol succinate (TOPROL-XL) 50 MG 24 hr tablet, Take 1 tablet (50 mg total) by mouth at bedtime. Take with or immediately following a meal., Disp: 90 tablet, Rfl: 3   mirtazapine (REMERON) 30 MG tablet, Take 30 mg by mouth daily., Disp: , Rfl:    Multiple Vitamin (MULTIVITAMIN) tablet, Take 1 tablet by mouth daily., Disp: , Rfl:    mupirocin ointment (BACTROBAN) 2 %, Apply 1 application topically daily. (Patient taking differently: Apply 1 application  topically as needed.), Disp: 30 g, Rfl: 0   Niacinamide 500 MG TBCR, Take 1 tablet by mouth in the morning and at bedtime. For skin cancer prevention., Disp: 60 tablet, Rfl: 11   Polypodium Leucotomos (HELIOCARE) 240 MG CAPS, Take 240 mg by mouth every evening., Disp: , Rfl:    Probiotic Product (CULTURELLE PROBIOTICS PO), Take 200 mg by mouth daily., Disp: , Rfl:    rivaroxaban (XARELTO) 20 MG TABS tablet, Take  1 tablet (20 mg total) by mouth daily., Disp: 30 tablet, Rfl: 5   rosuvastatin (CRESTOR) 40 MG tablet, Take 1 tablet (40 mg total) by mouth daily., Disp: , Rfl:    sacubitril-valsartan (ENTRESTO) 97-103 MG, Take 1 tablet by mouth 2 (two) times daily., Disp: 60 tablet, Rfl: 6   spironolactone (ALDACTONE) 25 MG tablet, Take 1 tablet (25 mg total) by mouth daily., Disp: 90 tablet, Rfl: 3   THERATEARS 0.25 % SOLN, Place 1 drop into both eyes as needed (dry eyes)., Disp: , Rfl:    Turmeric 500 MG TABS, Take by mouth daily., Disp: , Rfl:    vitamin C (ASCORBIC ACID) 500 MG tablet, Take 500 mg by mouth daily., Disp: , Rfl:   Social History   Tobacco Use  Smoking Status Never  Smokeless Tobacco Never    No Known Allergies Objective:  There were no vitals filed for this visit. There is no height or weight on file to calculate BMI. Constitutional Well developed. Well nourished.  Vascular Dorsalis pedis pulses palpable bilaterally. Posterior tibial pulses palpable bilaterally. Capillary refill normal to all digits.  No cyanosis or clubbing noted. Pedal hair growth normal.  Neurologic Normal speech. Oriented to person, place, and time. Epicritic sensation to light touch grossly present bilaterally.  Dermatologic Nails well groomed and normal in appearance. No open wounds. No skin lesions.  Orthopedic: Left great toe nail contusion pain on palpation.  Some damage to the nail noted.  Nail is well adhered to the underlying nailbed.  No open wounds or lesion noted.  No redness noted   Radiographs: 3 views of skeletally mature adult left great toe: No signs of fracture noted no other bony abnormalities noted no fracture no foreign body noted no osteomyelitis noted Assessment:   1. Left foot pain    Plan:  Patient was evaluated and treated and all questions answered.  Left great toe nail contusion with some damage to the nail -All questions and concerns were discussed with the patient  extensive detail -Less than 24% of nail is involved with hematoma.  At this time I encouraged him to continue monitoring the nail if it becomes loose he will come back and see me with the nail avulsion he states understanding. -I will see him back in 3 weeks to make sure he is healing okay.  No follow-ups on file.

## 2023-10-03 ENCOUNTER — Ambulatory Visit: Payer: 59 | Attending: Internal Medicine | Admitting: Internal Medicine

## 2023-10-03 VITALS — BP 110/70 | HR 74 | Ht 71.0 in

## 2023-10-03 DIAGNOSIS — I5022 Chronic systolic (congestive) heart failure: Secondary | ICD-10-CM | POA: Diagnosis not present

## 2023-10-03 NOTE — Patient Instructions (Signed)
Medication Instructions:  The current medical regimen is effective;  continue present plan and medications.  *If you need a refill on your cardiac medications before your next appointment, please call your pharmacy*   Follow-Up: At [facility], you and your health needs are our priority.  As part of our continuing mission to provide you with exceptional heart care, we have created designated Provider Care Teams.  These Care Teams include your primary Cardiologist (physician) and Advanced Practice Providers (APPs -  Physician Assistants and Nurse Practitioners) who all work together to provide you with the care you need, when you need it.  We recommend signing up for the patient portal called "MyChart".  Sign up information is provided on this After Visit Summary.  MyChart is used to connect with patients for Virtual Visits (Telemedicine).  Patients are able to view lab/test results, encounter notes, upcoming appointments, etc.  Non-urgent messages can be sent to your provider as well.   To learn more about what you can do with MyChart, go to [email or web address].    Your next appointment:   12 month(s)  Provider:   Nobie Putnam, MD

## 2023-10-03 NOTE — Progress Notes (Signed)
 Patient Care Team: Lynnea Ferrier, MD as PCP - General (Internal Medicine) Duke Salvia, MD as PCP - Electrophysiology (Cardiology) Duke Salvia, MD as PCP - Cardiology (Cardiology)   HPI  [name] is a 58 y.o. male Seen in followup for atrial fibrillation that was newly identified 2012.  He was found to have a cardiomyopathy.  Evaluation was also concerning for a bicuspid aortic valve>>  this was subsequently clarified as being trileaflet  On Guideline directed medical therapy   Hx  squamous cell cancer associated with lymphadenopathy.  He underwent radiation and chemotherapy .  Last evaluation there is no visual tumor.  .Interval diagnosis of prostate CA , eval at [facility] and [facility] and the decision has been to monitor with PSA    Coronary calcification with elevated CaScore>> on atorva 80: LHC 7/23>> no obstructive CAD  DATE TEST EF   2012    Echo   30 %   ? bicuspid  10/13    Echo  30-35 %   trileaflet  10/15    MRI  43 %   No LGE  2/18 Echo    40-45%   3/21 CTA  Ca Score 137 Non obstruc CAD AoV trileaflet  1/22 Echo  30-35%   4/22 LHC  No obstructive disease  6/22 [facility] [place] 40-45%   7/23 [facility]  No obstructive CAD  9/24 Echo  35-40%   2/25 cMRI 42%    The patient denies chest pain, nocturnal dyspnea, orthopnea or peripheral edema.  There have been no palpitations, lightheadedness or syncope.  Complains of min DOE .    Has told me the story of the death of his son. [name]  26 year old only child ex-preemie.     He and his wife have started a foundation to raise money which they plan to give to [facility]      Date Cr K Hgb TSH LDL   10/18 1.02  13.3    4/78 0.9  11.5  157  12/20 1.0  12.9 5.151   12/21 1.1  12.7 6.99 101  7/23 1.2 4.4 13.6 3.2   1/.25 1.2<<1.4 4.6 12.7 0.89 81        Past Medical History:  Diagnosis Date   Anxiety    Arthritis    gout   Atrial fibrillation (HCC) 1989   Basal cell carcinoma 06/23/2020   Upper mid back. Nodular.    Basal cell carcinoma 12/28/2020   Right upper arm, superficial, [facility] 03/07/2021   Basal cell carcinoma 04/01/2023   Left upper lateral arm. Superficial and nodular. [facility] 06/03/23   Basal cell carcinoma 04/01/2023   Right upper back. Superficial and nodular. 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 Hypertrophic. [facility] 06/03/23   throat cancer     Past Surgical History:  Procedure Laterality Date   CARDIAC CATHETERIZATION  03/2010  No sig coronary disease, EF ~40%   CARDIOVERSION  1989   PROSTATE SURGERY  06/26/2023   at [facility]   TOTAL KNEE ARTHROPLASTY Left     Current Outpatient Medications  Medication Sig Dispense Refill   ALLOPURINOL PO Take 400 mg by mouth daily.     b complex vitamins capsule Take 1 capsule by mouth daily.     Coenzyme Q10 (CO Q-10) 100 MG CAPS Take 1 capsule by mouth daily.     cyclobenzaprine (FLEXERIL) 10 MG tablet Take by mouth daily.     Docusate Sodium 100 MG capsule Take 100 mg by mouth daily. (Stool softener)     empagliflozin (JARDIANCE) 25 MG TABS tablet Take 12.5 mg by mouth daily.     fenofibrate 160 MG tablet Take 1 tablet by mouth daily.     fluorouracil (EFUDEX) 5 % cream Apply topically 2 (two) times daily. 30 g 2   FLUOXETINE HCL PO Take 80 mg by mouth daily.     hydrocortisone 2.5 % cream Apply BID to aa, until clear. 28 g 3   Ibuprofen 200 MG CAPS Take 800 mg by mouth 2 (two) times daily as needed (pain).     icosapent Ethyl (VASCEPA) 1 g capsule Take 4  capsules (4 g total) by mouth 2 (two) times daily. 720 capsule 3   LEVOTHYROXINE SODIUM PO Take 224 mcg by mouth daily.     Melatonin 5 MG CAPS Take 20 mg by mouth at bedtime. Takes 2 capsules po at bedtime for sleep.     metoprolol succinate (TOPROL-XL) 50 MG 24 hr tablet Take 1 tablet (50 mg total) by mouth at bedtime. Take with or immediately following a meal. 90 tablet 3   mirtazapine (REMERON) 30 MG tablet Take 30 mg by mouth daily.     Multiple Vitamin (MULTIVITAMIN) tablet Take 1 tablet by mouth daily.     mupirocin ointment (BACTROBAN) 2 % Apply 1 application topically daily. 30 g 0   Niacinamide 500 MG TBCR Take 1 tablet by mouth in the morning and at bedtime. For skin cancer prevention. 60 tablet 11   Polypodium Leucotomos (HELIOCARE) 240 MG CAPS Take 240 mg by mouth every evening.     Probiotic Product (CULTURELLE PROBIOTICS PO) Take 200 mg by mouth daily.     rivaroxaban (XARELTO) 20 MG TABS tablet Take 1 tablet (20 mg total) by mouth daily. 30 tablet 5   rosuvastatin (CRESTOR) 40 MG tablet Take 1 tablet (40 mg total) by mouth daily.     sacubitril-valsartan (ENTRESTO) 97-103 MG Take 1 tablet by mouth 2 (two) times daily. 60 tablet 6   spironolactone (ALDACTONE) 25 MG tablet Take 1 tablet (25 mg total) by mouth daily. 90 tablet 3   THERATEARS 0.25 % SOLN Place 1 drop into both eyes as needed (dry eyes).     Turmeric 500 MG TABS Take by mouth daily.     No current facility-administered medications for this visit.    No Known Allergies  Review of Systems negative except from HPI and PMH  Physical Exam BP 110/70 (BP Location: Left Arm, Patient Position: Sitting, Cuff Size: Normal)   Pulse 74   Ht 5\' 11"  (1.803 m)   BMI 36.40 kg/m  Well developed and nourished in no acute distress HENT normal Neck supple with JVP-  flat  Clear Regular rate and rhythm, no murmurs or gallops Abd-soft with active BS No Clubbing cyanosis edema Skin-warm and dry A & Oriented  Grossly  normal  sensory and motor function ECG sinus @ 74 19/08/38 Inferior Q waves PVC RBBB superior Axis   Assessment and  Plan  PVCs  Atrial Fibrillation   Cardiomyopathy--nonischemic  Hyperlipidemia  Cancers interval (As above)   Chest pain  Grief   No interval Afib; continue anticoagulation with Rivaroxaban  Euvolemic  continue GDMT with entresto spiro, metop and SGLT-2 PVC burden 8/24 < 3%

## 2023-10-15 ENCOUNTER — Encounter: Payer: Self-pay | Admitting: Student in an Organized Health Care Education/Training Program

## 2023-10-15 ENCOUNTER — Ambulatory Visit
Payer: 59 | Attending: Student in an Organized Health Care Education/Training Program | Admitting: Student in an Organized Health Care Education/Training Program

## 2023-10-15 VITALS — BP 101/67 | HR 86 | Temp 97.0°F | Resp 16 | Ht 70.0 in | Wt 248.0 lb

## 2023-10-15 DIAGNOSIS — G894 Chronic pain syndrome: Secondary | ICD-10-CM | POA: Insufficient documentation

## 2023-10-15 DIAGNOSIS — M47816 Spondylosis without myelopathy or radiculopathy, lumbar region: Secondary | ICD-10-CM | POA: Diagnosis not present

## 2023-10-15 NOTE — Progress Notes (Signed)
 PROVIDER NOTE: Information contained herein reflects review and annotations entered in association with encounter. Interpretation of such information and data should be left to medically-trained personnel. Information provided to patient can be located elsewhere in the medical record under "Patient Instructions". Document created using STT-dictation technology, any transcriptional errors that may result from process are unintentional.    Patient: [name]  Service Category: E/M  Provider: Edward Jolly, MD  DOB: 21-Aug-1965  DOS: 10/15/2023  Referring Provider: Lynnea Ferrier, MD  MRN: [id number]  Specialty: Interventional Pain Management  PCP: [name] Ferrier, MD  Type: Established Patient  Setting: Ambulatory outpatient    Location: Office  Delivery: Face-to-face     HPI  [name], a 58 y.o. year old male, is here today because of his Lumbar facet arthropathy [M47.816]. [name] primary complain today is Back Pain  Pertinent problems: [name] does not have any pertinent problems on file. Pain Assessment: Severity of Chronic pain is reported as a 7 /10. Location: Back Lower, Right/denies. Onset: More than a month ago. Quality: Tightness. Timing: Constant. Modifying factor(s): ibuprofen, gets better as the day goes on. Vitals:  height is 5\' 10"  (1.778 m) and weight is 248 lb (112.5 kg). His temperature is 97 F (36.1 C) (abnormal). His blood pressure is 101/67 and his pulse is 86. His respiration is 16 and oxygen saturation is 97%.  BMI: Estimated body mass index is 35.58 kg/m as calculated from the following:   Height as of this encounter: 5\' 10"  (1.778 m).   Weight as of this encounter: 248 lb (112.5 kg). Last encounter: Visit date not found. Last procedure: Visit date not found.  Reason for encounter: patient-requested evaluation.   Discussed the use of AI scribe software for clinical note transcription with the patient, who gave verbal consent to  proceed.  History of Present Illness   The patient presents with lower back pain and stiffness.  He experiences stiffness and pain primarily in the center to the right side of his lower back. The stiffness is most pronounced in the mornings upon waking and improves as the day progresses, particularly after sitting at his desk while working from home. Prolonged walking or standing exacerbates the stiffness, making it 'excruciating.'  He engages in stretching and low-impact exercises such as walking on a treadmill and riding a bike, which he finds more manageable than walking outside. He has not tried using heat in the morning but finds ibuprofen helpful for pain relief, although he is cautious about daily use due to his medication load. He has not tried acetaminophen yet.  He is currently taking Xarelto. He has a history of small disc herniations and moderate facet arthritis in the lower back, as confirmed by an MRI.  He has undergone  radiofrequency ablations in the past. His previous lumbar RFA was in 03/13/23.  This provided him with 75 to 80% pain relief in regards to his axial low back pain for over 6.5 months.  Given gradual return of of his low back pain, we discussed repeating lumbar RFA.  Risk and benefits were reviewed and patient would like to proceed.   In his family history, he mentions the loss of his brother, who had Crohn's disease and passed away due to complications from an infection.       ROS  Constitutional: Denies any fever or chills Gastrointestinal: No reported hemesis, hematochezia, vomiting, or acute GI distress Musculoskeletal:  Axial low back pain, worse with  facet loading Neurological: No reported episodes of acute onset apraxia, aphasia, dysarthria, agnosia, amnesia, paralysis, loss of coordination, or loss of consciousness  Medication Review  Allopurinol, Carboxymethylcellulose Sodium, Co Q-10, Docusate Sodium, FLUoxetine HCl, Ibuprofen, Levothyroxine Sodium,  Melatonin, Niacinamide ER, Polypodium Leucotomos, Probiotic Product, Turmeric, b complex vitamins, cyclobenzaprine, empagliflozin, fenofibrate, fluorouracil, hydrocortisone, icosapent Ethyl, metoprolol succinate, mirtazapine, multivitamin, mupirocin ointment, rivaroxaban, rosuvastatin, sacubitril-valsartan, and spironolactone  History Review  Allergy: [name] has no known allergies. Drug: [name]  reports no history of drug use. Alcohol:  reports current alcohol use of about 2.0 - 3.0 standard drinks of alcohol per week. Tobacco:  reports that he has never smoked. He has never used smokeless tobacco. Social: [name]  reports that he has never smoked. He has never used smokeless tobacco. He reports current alcohol use of about 2.0 - 3.0 standard drinks of alcohol per week. He reports that he does not use drugs. 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 Surgical: [name]  has a past surgical history that includes Cardioversion (1989); Cardiac catheterization (03/2010); Total knee arthroplasty (Left); and Prostate surgery (06/26/2023). Family: family history includes Arthritis in his mother; COPD in his mother; Coronary artery disease in his paternal grandfather; Crohn's disease in his brother; Depression in his mother; Diabetes in his mother; Heart attack in his father; Lung cancer in his maternal grandfather, paternal uncle, and paternal uncle; Pancreatic cancer in his paternal uncle; Thyroid cancer in his paternal  aunt.  Laboratory Chemistry Profile   Renal Lab Results  Component Value Date   BUN 26 (H) 06/04/2023   CREATININE 1.24 06/04/2023   BCR 21 (H) 06/04/2023   GFR 77.83 06/26/2016   GFRAA >60 10/13/2019   GFRNONAA >60 10/13/2019    Hepatic Lab Results  Component Value Date   AST 34 05/09/2017   ALT 34 05/09/2017   ALBUMIN 4.5 05/09/2017   ALKPHOS 66 05/09/2017   HCVAB NEG 10/29/2008   AMYLASE 41 11/06/2008   LIPASE 19 11/06/2008    Electrolytes Lab Results  Component Value Date   NA 139 06/04/2023   K 4.4 06/04/2023   CL 98 06/04/2023   CALCIUM 10.0 06/04/2023   MG 2.1 04/16/2013   PHOS 2.5 06/13/2010    Bone No results found for: "VD25OH", "VD125OH2TOT", "ZO1096EA5", "WU9811BJ4", "25OHVITD1", "25OHVITD2", "25OHVITD3", "TESTOFREE", "TESTOSTERONE"  Inflammation (CRP: Acute Phase) (ESR: Chronic Phase) Lab Results  Component Value Date   CRP 2.7 07/27/2014   ESRSEDRATE 9 07/27/2014         Note: Above Lab results reviewed.  Recent Imaging Review  CLINICAL DATA:  Low back pain for 6 weeks.  No known injury.   EXAM: MRI LUMBAR SPINE WITHOUT CONTRAST   TECHNIQUE: Multiplanar, multisequence MR imaging of the lumbar spine was performed. No intravenous contrast was administered.   COMPARISON:  01/13/2016   FINDINGS: Segmentation:  Standard.   Alignment: Minimal grade 1 anterolisthesis of L4 on L5 secondary to facet disease.   Vertebrae: No acute fracture, evidence of discitis, or aggressive bone lesion. Mild marrow edema in the L4 and L5 pedicles bilaterally.   Conus medullaris and cauda equina: Conus extends to the T12-L1 level. Conus and  cauda equina appear normal.   Paraspinal and other soft tissues: No acute paraspinal abnormality.   Disc levels:   Disc spaces: Disc spaces are preserved.   T12-L1: No significant disc bulge. No neural foraminal stenosis. No central canal stenosis.   L1-L2: No significant disc bulge. No neural foraminal stenosis.  No central canal stenosis.   L2-L3: Mild broad-based disc bulge. Mild bilateral facet arthropathy. Mild epidural lipomatosis unchanged. Mild spinal stenosis. No foraminal stenosis.   L3-L4: Mild broad-based disc bulge. Moderate bilateral facet arthropathy with ligamentum flavum infolding. Mild epidural lipomatosis. Mild-moderate spinal stenosis. Mild right and moderate left foraminal stenosis.   L4-L5: Mild broad-based disc bulge. Moderate bilateral facet arthropathy. Moderate-severe spinal stenosis. Mild-moderate bilateral foraminal stenosis.   L5-S1: Mild broad-based disc bulge with a small central shallow disc protrusion and annular fissure. Moderate right and mild left facet arthropathy. No spinal stenosis. Moderate bilateral foraminal stenosis.   IMPRESSION: 1. Lumbar spine spondylosis as described above which has mildly progressed compared with 01/13/2016. 2. At L4-5 there is a mild broad-based disc bulge. Moderate bilateral facet arthropathy. Moderate-severe spinal stenosis. Mild-moderate bilateral foraminal stenosis. 3. At L3-4 there is a mild broad-based disc bulge. Moderate bilateral facet arthropathy with ligamentum flavum infolding. Mild epidural lipomatosis. Mild-moderate spinal stenosis. Mild right and moderate left foraminal stenosis. 4. At L5-S1 there is a mild broad-based disc bulge with a small central shallow disc protrusion. Moderate right and mild left facet arthropathy. Moderate bilateral foraminal stenosis. 5. No acute osseous injury of the lumbar spine.     Electronically Signed   By: Elige Ko M.D.   On: 06/05/2023 16:41 Note: Reviewed        Physical Exam  General appearance: Well nourished, well developed, and well hydrated. In no apparent acute distress Mental status: Alert, oriented x 3 (person, place, & time)       Respiratory: No evidence of acute respiratory distress Eyes: PERLA Vitals: BP 101/67   Pulse 86   Temp (!) 97 F (36.1 C)    Resp 16   Ht 5\' 10"  (1.778 m)   Wt 248 lb (112.5 kg)   SpO2 97%   BMI 35.58 kg/m  BMI: Estimated body mass index is 35.58 kg/m as calculated from the following:   Height as of this encounter: 5\' 10"  (1.778 m).   Weight as of this encounter: 248 lb (112.5 kg). Ideal: Ideal body weight: 73 kg (160 lb 15 oz) Adjusted ideal body weight: 88.8 kg (195 lb 12.2 oz)  Axial low back pain, worse with facet loading +lumbar facet arthropathy  Assessment   Diagnosis Status  1. Lumbar facet arthropathy   2. Lumbar spondylosis   3. Chronic pain syndrome     Having a Flare-up Having a Flare-up Controlled     Plan of Care  Repeat bilateral L3, L4, L5 RFA with IV Versed.  Stop Eliquis 3 days prior to procedure.  Assessment and Plan    Lumbar Facet Arthropathy  He experiences chronic lower back pain with stiffness, mainly on the right side, worsened by prolonged standing or walking. An MRI from October reveals moderate arthritis in the lower back, contributing to stiffness. Current management includes exercise and stretching, with heat application and acetaminophen for symptom relief. Ibuprofen is effective but not preferred for daily use due to potential side effects. Acetaminophen is recommended due to its lower risk for kidney and stomach issues compared to ibuprofen. Recommend daily acetaminophen 1000 mg twice a day. Advise using a heating  pad for muscle relaxation. Continue stretching and low-impact exercises. Reserve ibuprofen for severe pain days. Schedule repeat radiofrequency ablation (RFA). Consider spinal cord stimulation if no improvement after a year of RFAs. Advise sleeping with a pillow between legs for spine stabilization.  Anticoagulation management   He is currently on Xarelto and requires management of anticoagulation around procedures. Stop Xarelto 3 days prior to RFA to minimize bleeding risk.  Prostate cancer status post prostatectomy   He had prostate cancer with  prostatectomy performed in November. Ongoing follow-up with PSA tests every three months. No current issues reported.  Follow-up   Schedule a follow-up appointment at the beginning of April for RFA.        Orders:  Orders Placed This Encounter  Procedures   Radiofrequency,Lumbar    Standing Status:   Future    Expiration Date:   01/15/2024    Scheduling Instructions:     Side(s): Bilateral     Level(s): L3, L4, L5, Medial Branch Nerve(s)     Sedation: With Sedation     Scheduling Timeframe: As soon as pre-approved     Stop xarelto 3 days before    Where will this procedure be performed?:   [facility]   Follow-up plan:   Return in about 22 days (around 11/06/2023) for B/L L3, 4, 5 RFA , in clinic IV Versed (stop Xarelto3 days prior).      Recent Visits No visits were found meeting these conditions. Showing recent visits within past 90 days and meeting all other requirements Today's Visits Date Type Provider Dept  10/15/23 Office Visit [name] Jolly, MD [facility]  Showing today's visits and meeting all other requirements Future Appointments Date Type Provider Dept  11/13/23 Appointment [name] Jolly, MD [facility]  Showing future appointments within next 90 days and meeting all other requirements  I discussed the assessment and treatment plan with the patient. The patient was provided an opportunity to ask questions and all were answered. The patient agreed with the plan and demonstrated an understanding of the instructions.  Patient advised to call back or seek an in-person evaluation if the symptoms or condition worsens.  Duration of encounter: .  Total time on encounter, as per AMA guidelines included both the face-to-face and non-face-to-face time personally spent by the physician and/or other qualified health care professional(s) on the day of the encounter (includes time in activities that require the physician or other qualified  health care professional and does not include time in activities normally performed by clinical staff). Physician's time may include the following activities when performed: Preparing to see the patient (e.g., pre-charting review of records, searching for previously ordered imaging, lab work, and nerve conduction tests) Review of prior analgesic pharmacotherapies. Reviewing PMP Interpreting ordered tests (e.g., lab work, imaging, nerve conduction tests) Performing post-procedure evaluations, including interpretation of diagnostic procedures Obtaining and/or reviewing separately obtained history Performing a medically appropriate examination and/or evaluation Counseling and educating the patient/family/caregiver Ordering medications, tests, or procedures Referring and communicating with other health care professionals (when not separately reported) Documenting clinical information in the electronic or other health record Independently interpreting results (not separately reported) and communicating results to the patient/ family/caregiver Care coordination (not separately reported)  Note by: [name] Jolly, MD Date: 10/15/2023; Time: 10:00 AM

## 2023-10-15 NOTE — Patient Instructions (Addendum)
 Stop Plavix 3 days before procedure.   Moderate Conscious Sedation, Adult, Care After After the procedure, it is common to have: Sleepiness for a few hours. Impaired judgment for a few hours. Trouble with balance. Nausea or vomiting if you eat too soon. Follow these instructions at home: For the time period you were told by your health care provider:  Rest. Do not participate in activities where you could fall or become injured. Do not drive or use machinery. Do not drink alcohol. Do not take sleeping pills or medicines that cause drowsiness. Do not make important decisions or sign legal documents. Do not take care of children on your own. Eating and drinking Follow instructions from your health care provider about what you may eat and drink. Drink enough fluid to keep your urine pale yellow. If you vomit: Drink clear fluids slowly and in small amounts as you are able. Clear fluids include water, ice chips, low-calorie sports drinks, and fruit juice that has water added to it (diluted fruit juice). Eat light and bland foods in small amounts as you are able. These foods include bananas, applesauce, rice, lean meats, toast, and crackers. General instructions Take over-the-counter and prescription medicines only as told by your health care provider. Have a responsible adult stay with you for the time you are told. Do not use any products that contain nicotine or tobacco. These products include cigarettes, chewing tobacco, and vaping devices, such as e-cigarettes. If you need help quitting, ask your health care provider. Return to your normal activities as told by your health care provider. Ask your health care provider what activities are safe for you. Your health care provider may give you more instructions. Make sure you know what you can and cannot do. Contact a health care provider if: You are still sleepy or having trouble with balance after 24 hours. You feel light-headed. You  vomit every time you eat or drink. You get a rash. You have a fever. You have redness or swelling around the IV site. Get help right away if: You have trouble breathing. You start to feel confused at home. These symptoms may be an emergency. Get help right away. Call 911. Do not wait to see if the symptoms will go away. Do not drive yourself to the hospital. This information is not intended to replace advice given to you by your health care provider. Make sure you discuss any questions you have with your health care provider. Document Revised: 02/05/2022 Document Reviewed: 02/05/2022 Elsevier Patient Education  2024 [employer]GENERAL RISKS AND COMPLICATIONS  What are the risk, side effects and possible complications? Generally speaking, most procedures are safe.  However, with any procedure there are risks, side effects, and the possibility of complications.  The risks and complications are dependent upon the sites that are lesioned, or the type of nerve block to be performed.  The closer the procedure is to the spine, the more serious the risks are.  Great care is taken when placing the radio frequency needles, block needles or lesioning probes, but sometimes complications can occur. Infection: Any time there is an injection through the skin, there is a risk of infection.  This is why sterile conditions are used for these blocks.  There are four possible types of infection. Localized skin infection. Central Nervous System Infection-This can be in the form of Meningitis, which can be deadly. Epidural Infections-This can be in the form of an epidural abscess, which can cause pressure inside of the spine, causing  compression of the spinal cord with subsequent paralysis. This would require an emergency surgery to decompress, and there are no guarantees that the patient would recover from the paralysis. Discitis-This is an infection of the intervertebral discs.  It occurs in about 1% of  discography procedures.  It is difficult to treat and it may lead to surgery.        2. Pain: the needles have to go through skin and soft tissues, will cause soreness.       3. Damage to internal structures:  The nerves to be lesioned may be near blood vessels or    other nerves which can be potentially damaged.       4. Bleeding: Bleeding is more common if the patient is taking blood thinners such as  aspirin, Coumadin, Ticiid, Plavix, etc., or if he/she have some genetic predisposition  such as hemophilia. Bleeding into the spinal canal can cause compression of the spinal  cord with subsequent paralysis.  This would require an emergency surgery to  decompress and there are no guarantees that the patient would recover from the  paralysis.       5. Pneumothorax:  Puncturing of a lung is a possibility, every time a needle is introduced in  the area of the chest or upper back.  Pneumothorax refers to free air around the  collapsed lung(s), inside of the thoracic cavity (chest cavity).  Another two possible  complications related to a similar event would include: Hemothorax and Chylothorax.   These are variations of the Pneumothorax, where instead of air around the collapsed  lung(s), you may have blood or chyle, respectively.       6. Spinal headaches: They may occur with any procedures in the area of the spine.       7. Persistent CSF (Cerebro-Spinal Fluid) leakage: This is a rare problem, but may occur  with prolonged intrathecal or epidural catheters either due to the formation of a fistulous  track or a dural tear.       8. Nerve damage: By working so close to the spinal cord, there is always a possibility of  nerve damage, which could be as serious as a permanent spinal cord injury with  paralysis.       9. Death:  Although rare, severe deadly allergic reactions known as "Anaphylactic  reaction" can occur to any of the medications used.      10. Worsening of the symptoms:  We can always make thing  worse.  What are the chances of something like this happening? Chances of any of this occuring are extremely low.  By statistics, you have more of a chance of getting killed in a motor vehicle accident: while driving to the hospital than any of the above occurring .  Nevertheless, you should be aware that they are possibilities.  In general, it is similar to taking a shower.  Everybody knows that you can slip, hit your head and get killed.  Does that mean that you should not shower again?  Nevertheless always keep in mind that statistics do not mean anything if you happen to be on the wrong side of them.  Even if a procedure has a 1 (one) in a 1,000,000 (million) chance of going wrong, it you happen to be that one..Also, keep in mind that by statistics, you have more of a chance of having something go wrong when taking medications.  Who should not have this procedure? If you are on a  blood thinning medication (e.g. Coumadin, Plavix, see list of "Blood Thinners"), or if you have an active infection going on, you should not have the procedure.  If you are taking any blood thinners, please inform your physician.  How should I prepare for this procedure? Do not eat or drink anything at least six hours prior to the procedure. Bring a driver with you .  It cannot be a taxi. Come accompanied by an adult that can drive you back, and that is strong enough to help you if your legs get weak or numb from the local anesthetic. Take all of your medicines the morning of the procedure with just enough water to swallow them. If you have diabetes, make sure that you are scheduled to have your procedure done first thing in the morning, whenever possible. If you have diabetes, take only half of your insulin dose and notify our nurse that you have done so as soon as you arrive at the clinic. If you are diabetic, but only take blood sugar pills (oral hypoglycemic), then do not take them on the morning of your procedure.   You may take them after you have had the procedure. Do not take aspirin or any aspirin-containing medications, at least eleven (11) days prior to the procedure.  They may prolong bleeding. Wear loose fitting clothing that may be easy to take off and that you would not mind if it got stained with Betadine or blood. Do not wear any jewelry or perfume Remove any nail coloring.  It will interfere with some of our monitoring equipment.  NOTE: Remember that this is not meant to be interpreted as a complete list of all possible complications.  Unforeseen problems may occur.  BLOOD THINNERS The following drugs contain aspirin or other products, which can cause increased bleeding during surgery and should not be taken for 2 weeks prior to and 1 week after surgery.  If you should need take something for relief of minor pain, you may take acetaminophen which is found in Tylenol,m Datril, Anacin-3 and Panadol. It is not blood thinner. The products listed below are.  Do not take any of the products listed below in addition to any listed on your instruction sheet.  A.P.C or A.P.C with Codeine Codeine Phosphate Capsules #3 Ibuprofen Ridaura  ABC compound Congesprin Imuran rimadil  Advil Cope Indocin Robaxisal  Alka-Seltzer Effervescent Pain Reliever and Antacid Coricidin or Coricidin-D  Indomethacin Rufen  Alka-Seltzer plus Cold Medicine Cosprin Ketoprofen S-A-C Tablets  Anacin Analgesic Tablets or Capsules Coumadin Korlgesic Salflex  Anacin Extra Strength Analgesic tablets or capsules CP-2 Tablets Lanoril Salicylate  Anaprox Cuprimine Capsules Levenox Salocol  Anexsia-D Dalteparin Magan Salsalate  Anodynos Darvon compound Magnesium Salicylate Sine-off  Ansaid Dasin Capsules Magsal Sodium Salicylate  Anturane Depen Capsules Marnal Soma  APF Arthritis pain formula Dewitt's Pills Measurin Stanback  Argesic Dia-Gesic Meclofenamic Sulfinpyrazone  Arthritis Bayer Timed Release Aspirin Diclofenac Meclomen  Sulindac  Arthritis pain formula Anacin Dicumarol Medipren Supac  Analgesic (Safety coated) Arthralgen Diffunasal Mefanamic Suprofen  Arthritis Strength Bufferin Dihydrocodeine Mepro Compound Suprol  Arthropan liquid Dopirydamole Methcarbomol with Aspirin Synalgos  ASA tablets/Enseals Disalcid Micrainin Tagament  Ascriptin Doan's Midol Talwin  Ascriptin A/D Dolene Mobidin Tanderil  Ascriptin Extra Strength Dolobid Moblgesic Ticlid  Ascriptin with Codeine Doloprin or Doloprin with Codeine Momentum Tolectin  Asperbuf Duoprin Mono-gesic Trendar  Aspergum Duradyne Motrin or Motrin IB Triminicin  Aspirin plain, buffered or enteric coated Durasal Myochrisine Trigesic  Aspirin Suppositories Easprin Nalfon Trillsate  Aspirin with Codeine Ecotrin Regular or Extra Strength Naprosyn Uracel  Atromid-S Efficin Naproxen Ursinus  Auranofin Capsules Elmiron Neocylate Vanquish  Axotal Emagrin Norgesic Verin  Azathioprine Empirin or Empirin with Codeine Normiflo Vitamin E  Azolid Emprazil Nuprin Voltaren  Bayer Aspirin plain, buffered or children's or timed BC Tablets or powders Encaprin Orgaran Warfarin Sodium  Buff-a-Comp Enoxaparin Orudis Zorpin  Buff-a-Comp with Codeine Equegesic Os-Cal-Gesic   Buffaprin Excedrin plain, buffered or Extra Strength Oxalid   Bufferin Arthritis Strength Feldene Oxphenbutazone   Bufferin plain or Extra Strength Feldene Capsules Oxycodone with Aspirin   Bufferin with Codeine Fenoprofen Fenoprofen Pabalate or Pabalate-SF   Buffets II Flogesic Panagesic   Buffinol plain or Extra Strength Florinal or Florinal with Codeine Panwarfarin   Buf-Tabs Flurbiprofen Penicillamine   Butalbital Compound Four-way cold tablets Penicillin   Butazolidin Fragmin Pepto-Bismol   Carbenicillin Geminisyn Percodan   Carna Arthritis Reliever Geopen Persantine   Carprofen Gold's salt Persistin   Chloramphenicol Goody's Phenylbutazone   Chloromycetin Haltrain Piroxlcam   Clmetidine heparin  Plaquenil   Cllnoril Hyco-pap Ponstel   Clofibrate Hydroxy chloroquine Propoxyphen         Before stopping any of these medications, be sure to consult the physician who ordered them.  Some, such as Coumadin (Warfarin) are ordered to prevent or treat serious conditions such as "deep thrombosis", "pumonary embolisms", and other heart problems.  The amount of time that you may need off of the medication may also vary with the medication and the reason for which you were taking it.  If you are taking any of these medications, please make sure you notify your pain physician before you undergo any procedures.         Radiofrequency Ablation Radiofrequency ablation is a procedure that is performed to relieve pain. The procedure is often used for back, neck, or arm pain. Radiofrequency ablation involves the use of a machine that creates radio waves to make heat. During the procedure, the heat is applied to the nerve that carries the pain signal. The heat damages the nerve and interferes with the pain signal. Pain relief usually starts about 2 weeks after the procedure and lasts for 6 months to 1 year. Tell a health care provider about: Any allergies you have. All medicines you are taking, including vitamins, herbs, eye drops, creams, and over-the-counter medicines. Any problems you or family members have had with anesthetic medicines. Any bleeding problems you have. Any surgeries you have had. Any medical conditions you have. Whether you are pregnant or may be pregnant. What are the risks? Generally, this is a safe procedure. However, problems may occur, including: Pain or soreness at the injection site. Allergic reaction to medicines given during the procedure. Bleeding. Infection at the injection site. Damage to nerves or blood vessels. What happens before the procedure? When to stop eating and drinking Follow instructions from your health care provider about what you may eat and drink  before your procedure. These may include: 8 hours before the procedure Stop eating most foods. Do not eat meat, fried foods, or fatty foods. Eat only light foods, such as toast or crackers. All liquids are okay except energy drinks and alcohol. 6 hours before the procedure Stop eating. Drink only clear liquids, such as water, clear fruit juice, black coffee, plain tea, and sports drinks. Do not drink energy drinks or alcohol. 2 hours before the procedure Stop drinking all liquids. You may be allowed to take medicine with small sips of water. If you  do not follow your health care provider's instructions, your procedure may be delayed or canceled. Medicines Ask your health care provider about: Changing or stopping your regular medicines. This is especially important if you are taking diabetes medicines or blood thinners. Taking medicines such as aspirin and ibuprofen. These medicines can thin your blood. Do not take these medicines unless your health care provider tells you to take them. Taking over-the-counter medicines, vitamins, herbs, and supplements. General instructions Ask your health care provider what steps will be taken to help prevent infection. These steps may include: Removing hair at the procedure site. Washing skin with a germ-killing soap. Taking antibiotic medicine. If you will be going home right after the procedure, plan to have a responsible adult: Take you home from the hospital or clinic. You will not be allowed to drive. Care for you for the time you are told. What happens during the procedure?  You will be awake during the procedure. You will need to be able to talk with the health care provider during the procedure. An IV will be inserted into one of your veins. You will be given one or more of the following: A medicine to help you relax (sedative). A medicine to numb the area (local anesthetic). Your health care provider will insert a radiofrequency needle  into the area to be treated. This is done with the help of fluoroscopy. A wire that carries the radio waves (electrode) will be put through the radiofrequency needle. An electrical pulse will be sent through the electrode to verify the correct nerve that is causing your pain. You will feel a tingling sensation, and you may have muscle twitching. The tissue around the needle tip will be heated by an electric current that comes from the radiofrequency machine. This will numb the nerves. The needle will be removed. A bandage (dressing) will be put on the insertion area. The procedure may vary among health care providers and hospitals. What happens after the procedure? Your blood pressure, heart rate, breathing rate, and blood oxygen level will be monitored until you leave the hospital or clinic. Return to your normal activities as told by your health care provider. Ask your health care provider what activities are safe for you. If you were given a sedative during the procedure, it can affect you for several hours. Do not drive or operate machinery until your health care provider says that it is safe. Summary Radiofrequency ablation is a procedure that is performed to relieve pain. The procedure is often used for back, neck, or arm pain. Radiofrequency ablation involves the use of a machine that creates radio waves to make heat. Plan to have a responsible adult take you home from the hospital or clinic. Do not drive or operate machinery until your health care provider says that it is safe. Return to your normal activities as told by your health care provider. Ask your health care provider what activities are safe for you. This information is not intended to replace advice given to you by your health care provider. Make sure you discuss any questions you have with your health care provider. Document Revised: 01/10/2021 Document Reviewed: 01/10/2021 Elsevier Patient Education  2024 [employer].

## 2023-10-15 NOTE — Progress Notes (Signed)
 Safety precautions to be maintained throughout the outpatient stay will include: orient to surroundings, keep bed in low position, maintain call bell within reach at all times, provide assistance with transfer out of bed and ambulation.

## 2023-10-17 ENCOUNTER — Ambulatory Visit: Payer: 59 | Admitting: Podiatry

## 2023-10-17 DIAGNOSIS — S90212A Contusion of left great toe with damage to nail, initial encounter: Secondary | ICD-10-CM | POA: Diagnosis not present

## 2023-10-17 DIAGNOSIS — R1084 Generalized abdominal pain: Secondary | ICD-10-CM | POA: Insufficient documentation

## 2023-10-17 DIAGNOSIS — I700 Atherosclerosis of aorta: Secondary | ICD-10-CM | POA: Insufficient documentation

## 2023-10-17 DIAGNOSIS — E663 Overweight: Secondary | ICD-10-CM | POA: Insufficient documentation

## 2023-10-17 DIAGNOSIS — G4733 Obstructive sleep apnea (adult) (pediatric): Secondary | ICD-10-CM | POA: Insufficient documentation

## 2023-10-17 DIAGNOSIS — K0530 Chronic periodontitis, unspecified: Secondary | ICD-10-CM | POA: Insufficient documentation

## 2023-10-17 NOTE — Progress Notes (Signed)
 Subjective:  Patient ID: [name], male    DOB: February 03, 1966,  MRN: [id number]  Chief Complaint  Patient presents with   Nail Problem    58 y.o. male presents with the above complaint.  Patient presents for follow-up on left great toe contusion.  He states doing better improving denies any other acute complaints.   Review of Systems: Negative except as noted in the HPI. Denies N/V/F/Ch.  Past Medical History:  Diagnosis Date   Anxiety    Arthritis    gout   Atrial fibrillation (HCC) 1989   Basal cell carcinoma 06/23/2020   Upper mid back. Nodular.   Basal cell carcinoma 12/28/2020   Right upper arm, superficial, [facility] 03/07/2021   Basal cell carcinoma 04/01/2023   Left upper lateral arm. Superficial and nodular. [facility] 06/03/23   Basal cell carcinoma 04/01/2023   Right upper back. Superficial and nodular. 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 Hypertrophic. [facility] 06/03/23   throat cancer     Current Outpatient Medications:    ALLOPURINOL PO, Take 400 mg by mouth daily., Disp: , Rfl:    b complex vitamins capsule, Take 1 capsule by mouth daily., Disp: , Rfl:    Coenzyme Q10 (CO Q-10) 100 MG CAPS, Take 1 capsule by mouth daily., Disp: , Rfl:    cyclobenzaprine (FLEXERIL) 10 MG tablet, Take by mouth daily., Disp: , Rfl:    Docusate Sodium 100 MG capsule, Take 100 mg by mouth daily. (Stool softener), Disp: , Rfl:    empagliflozin (JARDIANCE) 25 MG TABS tablet, Take 12.5 mg by mouth  daily., Disp: , Rfl:    fenofibrate 160 MG tablet, Take 1 tablet by mouth daily., Disp: , Rfl:    fluorouracil (EFUDEX) 5 % cream, Apply topically 2 (two) times daily., Disp: 30 g, Rfl: 2   FLUOXETINE HCL PO, Take 80 mg by mouth daily., Disp: , Rfl:    hydrocortisone 2.5 % cream, Apply BID to aa, until clear., Disp: 28 g, Rfl: 3   Ibuprofen 200 MG CAPS, Take 800 mg by mouth 2 (two) times daily as needed (pain)., Disp: , Rfl:    icosapent Ethyl (VASCEPA) 1 g capsule, Take 4 capsules (4 g total) by mouth 2 (two) times daily., Disp: 720 capsule, Rfl: 3   LEVOTHYROXINE SODIUM PO, Take 224 mcg by mouth daily., Disp: , Rfl:    Melatonin 5 MG CAPS, Take 20 mg by mouth at bedtime. Takes 2 capsules po at bedtime for sleep., Disp: , Rfl:    metoprolol succinate (TOPROL-XL) 50 MG 24 hr tablet, Take 1 tablet (50 mg total) by mouth at bedtime. Take with or immediately following a meal., Disp: 90 tablet, Rfl: 3   mirtazapine (REMERON) 30 MG tablet, Take 30 mg by mouth daily., Disp: , Rfl:    Multiple Vitamin (MULTIVITAMIN) tablet, Take 1 tablet by mouth daily., Disp: , Rfl:  mupirocin ointment (BACTROBAN) 2 %, Apply 1 application topically daily., Disp: 30 g, Rfl: 0   Niacinamide 500 MG TBCR, Take 1 tablet by mouth in the morning and at bedtime. For skin cancer prevention., Disp: 60 tablet, Rfl: 11   Polypodium Leucotomos (HELIOCARE) 240 MG CAPS, Take 240 mg by mouth every evening., Disp: , Rfl:    Probiotic Product (CULTURELLE PROBIOTICS PO), Take 200 mg by mouth daily., Disp: , Rfl:    rivaroxaban (XARELTO) 20 MG TABS tablet, Take 1 tablet (20 mg total) by mouth daily., Disp: 30 tablet, Rfl: 5   rosuvastatin (CRESTOR) 40 MG tablet, Take 1 tablet (40 mg total) by mouth daily., Disp: , Rfl:    sacubitril-valsartan (ENTRESTO) 97-103 MG, Take 1 tablet by mouth 2 (two) times daily., Disp: 60 tablet, Rfl: 6   spironolactone (ALDACTONE) 25 MG tablet, Take 1 tablet (25 mg total) by mouth daily., Disp: 90 tablet,  Rfl: 3   THERATEARS 0.25 % SOLN, Place 1 drop into both eyes as needed (dry eyes)., Disp: , Rfl:    Turmeric 500 MG TABS, Take by mouth daily., Disp: , Rfl:   Social History   Tobacco Use  Smoking Status Never  Smokeless Tobacco Never    No Known Allergies Objective:  There were no vitals filed for this visit. There is no height or weight on file to calculate BMI. Constitutional Well developed. Well nourished.  Vascular Dorsalis pedis pulses palpable bilaterally. Posterior tibial pulses palpable bilaterally. Capillary refill normal to all digits.  No cyanosis or clubbing noted. Pedal hair growth normal.  Neurologic Normal speech. Oriented to person, place, and time. Epicritic sensation to light touch grossly present bilaterally.  Dermatologic Nails well groomed and normal in appearance. No open wounds. No skin lesions.  Orthopedic: No further left great toe nail contusion pain on palpation.  No further some damage to the nail noted.  Nail is well adhered to the underlying nailbed.  No open wounds or lesion noted.  No redness noted   Radiographs: 3 views of skeletally mature adult left great toe: No signs of fracture noted no other bony abnormalities noted no fracture no foreign body noted no osteomyelitis noted Assessment:   No diagnosis found.  Plan:  Patient was evaluated and treated and all questions answered.  Left great toe nail contusion with some damage to the nail -All questions and concerns were discussed with the patient extensive detail -Clinically healed doing well.  Denies any other acute complaints redness has resolved.  If any foot and ankle issues arise future she will come back and see me.  I discussed with him the nail may fall off in the future at this time is well adhered to the underlying nailbed.  No follow-ups on file.

## 2023-11-01 NOTE — Progress Notes (Signed)
 Cardiology Office Note:  .   Date:  11/06/2023  ID:  [name], DOB Oct 29, 1965, MRN [id number] PCP: Lynnea Ferrier, MD  [facility] Providers Cardiologist:  Sherryl Manges, MD Electrophysiologist:  Sherryl Manges, MD    Patient Profile: .      PMH Atrial fibrillation Nonischemic cardiomyopathy TTE 08/2020 EF 30-35 Minimal luminal irregularities on cath 02/2022 (at [facility]) TTE 04/2023 EF 35-40 Hyperlipidemia Hyperchylomicronemia Hypertension Coronary artery disease Mild nonobstructive CAD on cath 02/2022 Squamous cell carcinoma with lymphadenopathy XRT and chemotherapy Family history heart disease Father and Grandfather had Mis in their 15s OSA Obesity  Referred to advanced lipid disorder clinic and had virtual visit with Dr. Rennis Golden 10/09/2022.  He reported a history of high triglycerides.  Coronary CT revealed age advanced coronary calcification and presumed nonischemic cardiomyopathy.  He has a history of extensive heart disease in his family.  He reported history of elevated cholesterol even in the distant past when he was more active in the [employer].  He was previously on atorvastatin 20 mg daily and had recently switched to rosuvastatin 10 mg daily because of potential side effects.  He had also been on fenofibrate 48 mg daily and taking 500 mg fish oil over-the-counter.  Labs January 2024 showed total cholesterol 235, triglycerides 692 and LDL was not assessed.  He is not diabetic and has had normal blood sugars. He reported multiple family members with heart history and his son who died at age 61 of a heart related condition but could not clearly say that family members had high triglycerides. Dr. 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  He was advised to stop OTC fish oil and start Vascepa 2 g twice daily.  Fenofibrate was increased to 160 mg daily.  Genetic testing was discussed and he was going to look at the cost  through the [place].  There were no current clinical trials for enrollment.       History of Present Illness: .   [name] is a very pleasant 58 y.o. male who is here today for follow-up of dyslipidemia. Reports he is feeling well. Continues to have to urinate frequently and asks about continuing spironolactone. He recently underwent prostatectomy for prostate CA. He reports occasional severe headaches, especially when lifting weights. Pain comes on suddenly and is a stabbing pain. He has an underlying dull headache. Admits he is trying to ensure better hydration. Has noticed that taking a full pill of metoprolol instead of the prescribed half pill causes dizziness, but no presyncope or syncope. He remains physically active, lifting weights six days a week and doing cardio exercises. He maintains a high-protein diet and logs food intake using My Fitness Pal app. He would like to lose about thirty pounds. Thinks biggest challenge is insatiable appetite. Was previously on GLP-1 agonist but developed severe diarrhea with a higher dose and subsequently stopped it.   Discussed the use of AI scribe software for clinical note transcription with the patient, who gave verbal consent to proceed.   ROS: See HPI       Studies Reviewed: Marland Kitchen        No results found for: "LIPOA"   Risk Assessment/Calculations:    CHA2DS2-VASc Score = 2   This indicates a 2.2% annual risk of stroke. The patient's score is based upon: CHF History: 1 HTN History: 1 Diabetes History: 0 Stroke History: 0 Vascular Disease History: 0 Age Score: 0 Gender Score: 0  STOP-Bang Score:  7      Physical Exam:   VS:  BP 128/82 (BP Location: Right Arm, Patient Position: Sitting, Cuff Size: Large)   Pulse 84   Ht 5\' 10"  (1.778 m)   Wt 255 lb (115.7 kg)   BMI 36.59 kg/m    Wt Readings from Last 3 Encounters:  11/06/23 255 lb (115.7 kg)  10/15/23 248 lb (112.5 kg)  09/25/23 261 lb (118.4 kg)    GEN: Obese, well  developed in no acute distress NECK: No JVD; No carotid bruits CARDIAC: RRR, no murmurs, rubs, gallops RESPIRATORY:  Clear to auscultation without rales, wheezing or rhonchi  ABDOMEN: Soft, non-tender, non-distended EXTREMITIES:  No edema; No deformity     ASSESSMENT AND PLAN: .    Dyslipidemia/Hyperchylomicronemia: Lipid panel completed 09/04/2023 with total cholesterol 162, triglycerides 187, LDL-C 81, and HDL 43.  Although he has made significant improvement, LDL remains slightly above goal. He is tolerating fenofibrate, rosuvastatin, and Vascepa without concerning side effects. He has mild CAD therefore goal LDL is < 70. He has been taking 3 grams Vascepa twice daily rather than 4 grams twice daily. He will increase to 4 grams BID. We discussed dietary changes that may help improve LDL and triglycerides. He is going to try walking for at least 10 minutes after meals. We will plan to recheck in 6 months. No change in medication today.   Genetic testing: He asks about genetic testing.  Information provided.  [facility].  Advised him to notify us if he wishes [place] to order the test at which time he could come by the office one day for his cheek swab.   Headaches: He is having stabbing pain in his head while lifting weights and a dull headache at other times throughout the day.  I encouraged him to seek soon follow-up with PCP for evaluation.   CAD: History of mild nonobstructive CAD on cardiac cath 02/2022. He denies chest pain, dyspnea, or other symptoms concerning for angina.  No indication for further ischemic evaluation at this time.        Disposition:6 months with Dr. Rennis Golden or me with NMR prior  Signed, Eligha Bridegroom, NP-C

## 2023-11-06 ENCOUNTER — Encounter (HOSPITAL_BASED_OUTPATIENT_CLINIC_OR_DEPARTMENT_OTHER): Payer: Self-pay | Admitting: Nurse Practitioner

## 2023-11-06 ENCOUNTER — Ambulatory Visit (HOSPITAL_BASED_OUTPATIENT_CLINIC_OR_DEPARTMENT_OTHER): Admitting: Nurse Practitioner

## 2023-11-06 VITALS — BP 128/82 | HR 84 | Ht 70.0 in | Wt 255.0 lb

## 2023-11-06 DIAGNOSIS — R519 Headache, unspecified: Secondary | ICD-10-CM | POA: Diagnosis not present

## 2023-11-06 DIAGNOSIS — I25118 Atherosclerotic heart disease of native coronary artery with other forms of angina pectoris: Secondary | ICD-10-CM | POA: Diagnosis not present

## 2023-11-06 DIAGNOSIS — E783 Hyperchylomicronemia: Secondary | ICD-10-CM

## 2023-11-06 DIAGNOSIS — Z1379 Encounter for other screening for genetic and chromosomal anomalies: Secondary | ICD-10-CM

## 2023-11-06 DIAGNOSIS — E785 Hyperlipidemia, unspecified: Secondary | ICD-10-CM | POA: Diagnosis not present

## 2023-11-06 MED ORDER — ROSUVASTATIN CALCIUM 40 MG PO TABS: 40.0000 mg | ORAL_TABLET | Freq: Every day | ORAL | Status: AC

## 2023-11-06 MED ORDER — METOPROLOL SUCCINATE ER 50 MG PO TB24: 50.0000 mg | ORAL_TABLET | Freq: Every evening | ORAL | 3 refills | Status: AC

## 2023-11-06 NOTE — Patient Instructions (Signed)
 Medication Instructions:   Your physician recommends that you continue on your current medications as directed. Please refer to the Current Medication list given to you today.   *If you need a refill on your cardiac medications before your next appointment, please call your pharmacy*  Lab Work:  Your physician recommends that you return for a FASTING NMR after midnight. One week prior to your follow up appointment in 6 months. Paperwork given to pt today.     If you have labs (blood work) drawn today and your tests are completely normal, you will receive your results only by: MyChart Message (if you have MyChart) OR A paper copy in the mail If you have any lab test that is abnormal or we need to change your treatment, we will call you to review the results.  Testing/Procedures:  Pt given information on genetic testing. Here are the codes for the [place]:   E78.3 Hyperchylomicronemia  I25.118  Coronary Artery Disease E78.5 Hyperlipidemia    Follow-Up: At [facility], you and your health needs are our priority.  As part of our continuing mission to provide you with exceptional heart care, our providers are all part of one team.  This team includes your primary Cardiologist (physician) and Advanced Practice Providers or APPs (Physician Assistants and Nurse Practitioners) who all work together to provide you with the care you need, when you need it.  Your next appointment:   6 month(s)  Provider:   K. Italy Hilty, MD or Eligha Bridegroom, NP    We recommend signing up for the patient portal called "MyChart".  Sign up information is provided on this After Visit Summary.  MyChart is used to connect with patients for Virtual Visits (Telemedicine).  Patients are able to view lab/test results, encounter notes, upcoming appointments, etc.  Non-urgent messages can be sent to your provider as well.   To learn more about what you can do with MyChart, go to [email or web address].    Other Instructions  Your physician wants you to follow-up in: 6 months.  You will receive a reminder letter in the mail two months in advance. If you don't receive a letter, please call our office to schedule the follow-up appointment.

## 2023-11-12 ENCOUNTER — Telehealth: Payer: Self-pay

## 2023-11-12 ENCOUNTER — Telehealth: Payer: Self-pay | Admitting: Student in an Organized Health Care Education/Training Program

## 2023-11-12 NOTE — Telephone Encounter (Signed)
 PT called stated that he will be having a procedure done today with another provider. PT stated that he will be getting some sedation with that procedure and wants to know should he keep his appt for tomorrow. Please give patient a call. TY

## 2023-11-12 NOTE — Telephone Encounter (Signed)
 Called patient to ask what kind of procedure he is having so that we can discuss with Dr Cherylann Ratel.

## 2023-11-13 ENCOUNTER — Ambulatory Visit
Admission: RE | Admit: 2023-11-13 | Discharge: 2023-11-13 | Disposition: A | Discharge status: Home / Self Care | Source: Ambulatory Visit | Attending: Student in an Organized Health Care Education/Training Program | Admitting: Student in an Organized Health Care Education/Training Program

## 2023-11-13 ENCOUNTER — Encounter: Payer: Self-pay | Admitting: Student in an Organized Health Care Education/Training Program

## 2023-11-13 ENCOUNTER — Ambulatory Visit (HOSPITAL_BASED_OUTPATIENT_CLINIC_OR_DEPARTMENT_OTHER): Admitting: Student in an Organized Health Care Education/Training Program

## 2023-11-13 ENCOUNTER — Other Ambulatory Visit: Payer: Self-pay | Admitting: Student in an Organized Health Care Education/Training Program

## 2023-11-13 VITALS — BP 113/72 | HR 78 | Temp 98.1°F | Resp 16 | Ht 70.0 in | Wt 255.0 lb

## 2023-11-13 DIAGNOSIS — M47816 Spondylosis without myelopathy or radiculopathy, lumbar region: Secondary | ICD-10-CM | POA: Insufficient documentation

## 2023-11-13 DIAGNOSIS — G894 Chronic pain syndrome: Secondary | ICD-10-CM

## 2023-11-13 MED ORDER — LIDOCAINE HCL 2 % IJ SOLN
20.0000 mL | Freq: Once | INTRAMUSCULAR | Status: AC
  Administered 2023-11-13: 400 mg

## 2023-11-13 MED ORDER — ROPIVACAINE HCL 2 MG/ML IJ SOLN
18.0000 mL | Freq: Once | INTRAMUSCULAR | Status: AC
  Administered 2023-11-13: 18 mL via PERINEURAL

## 2023-11-13 MED ORDER — MIDAZOLAM HCL 2 MG/2ML IJ SOLN
INTRAMUSCULAR | Status: AC
  Filled 2023-11-13: qty 2

## 2023-11-13 MED ORDER — ROPIVACAINE HCL 2 MG/ML IJ SOLN
INTRAMUSCULAR | Status: AC
  Filled 2023-11-13: qty 20

## 2023-11-13 MED ORDER — MIDAZOLAM HCL 2 MG/2ML IJ SOLN
0.5000 mg | Freq: Once | INTRAMUSCULAR | Status: AC
  Administered 2023-11-13: 2 mg via INTRAVENOUS

## 2023-11-13 MED ORDER — LIDOCAINE HCL 2 % IJ SOLN
INTRAMUSCULAR | Status: AC
  Filled 2023-11-13: qty 20

## 2023-11-13 MED ORDER — LACTATED RINGERS IV SOLN: Freq: Once | INTRAVENOUS | Status: AC

## 2023-11-13 MED ORDER — DEXAMETHASONE SODIUM PHOSPHATE 10 MG/ML IJ SOLN
INTRAMUSCULAR | Status: AC
  Filled 2023-11-13: qty 2

## 2023-11-13 MED ORDER — DEXAMETHASONE SODIUM PHOSPHATE 10 MG/ML IJ SOLN
20.0000 mg | Freq: Once | INTRAMUSCULAR | Status: AC
  Administered 2023-11-13: 20 mg

## 2023-11-13 NOTE — Progress Notes (Signed)
 Safety precautions to be maintained throughout the outpatient stay will include: orient to surroundings, keep bed in low position, maintain call bell within reach at all times, provide assistance with transfer out of bed and ambulation.

## 2023-11-13 NOTE — Progress Notes (Signed)
 Patient's Name: [name]  MRN: [id number]  Referring Provider: Lynnea Ferrier, MD  DOB: 1965-12-09  PCP: Lynnea Ferrier, MD  DOS: 11/13/2023  Note by: Edward Jolly, MD  Service setting: Ambulatory outpatient  Specialty: Interventional Pain Management  Patient type: Established  Location: [facility] (AMB) Pain Management Facility  Visit type: Interventional Procedure   Primary Reason for Visit: Interventional Pain Management Treatment. CC: Back Pain (lower)   Procedure:          Anesthesia, Analgesia, Anxiolysis:  Type: Thermal Lumbar Facet, Medial Branch Radiofrequency Ablation/Neurotomy  #7 Primary Purpose: Therapeutic Region: Posterolateral Lumbosacral Spine Level:  L3, L4, L5, Medial Branch Level(s). These levels will denervate the L3-4, L4-5,lumbar facet joints. Laterality:  Bilateral  Type: Local Anesthesia  Local Anesthetic: Lidocaine 1-2% Anxiolysis: IV Versed 2 mg Position: Prone   Indications: 1. Lumbar facet arthropathy   2. Lumbar spondylosis   3. Chronic pain syndrome     Mr. Guidice has been dealing with the above chronic pain for longer than three months and has either failed to respond, was unable to tolerate, or simply did not get enough benefit from other more conservative therapies including, but not limited to: 1. Over-the-counter medications 2. Anti-inflammatory medications 3. Muscle relaxants 4. Membrane stabilizers 5. Opioids 6. Physical therapy and/or chiropractic manipulation 7. Modalities (Heat, ice, etc.) 8. Invasive techniques such as nerve blocks. Mr. Mullendore has attained more than 50% relief of the pain from a series of diagnostic injections conducted in separate occasions.  Pain Score: Pre-procedure: 6 /10 Post-procedure: 0-No pain/10  Stopped Xarelto 3 days ago  Pre-op Assessment:  Mr. Popoff is a 58 y.o. (year old), male patient, seen today for interventional treatment. He  has a past surgical history that includes Cardioversion  (1989); Cardiac catheterization (03/2010); Total knee arthroplasty (Left); Prostate surgery (06/26/2023); and tooth implant. Mr. Goins has a current medication list which includes the following prescription(s): allopurinol, b complex vitamins, co q-10, cyclobenzaprine, docusate sodium, empagliflozin, fenofibrate, fluorouracil, fluoxetine hcl, hydrocortisone, ibuprofen, icosapent ethyl, levothyroxine sodium, melatonin, metoprolol succinate, mirtazapine, multivitamin, mupirocin ointment, niacinamide er, heliocare, probiotic product, rivaroxaban, rosuvastatin, entresto, spironolactone, theratears, and turmeric, and the following Facility-Administered Medications: dexamethasone, lactated ringers, lidocaine, midazolam, and ropivacaine (pf) 2 mg/ml (0.2%). His primarily concern today is the Back Pain (lower)   Initial Vital Signs:  Pulse/HCG Rate: 72ECG Heart Rate: 66 Temp: 98.1 F (36.7 C) Resp: 18 BP: 107/70 SpO2: 100 %  BMI: Estimated body mass index is 36.59 kg/m as calculated from the following:   Height as of this encounter: 5\' 10"  (1.778 m).   Weight as of this encounter: 255 lb (115.7 kg).  Risk Assessment: Allergies: Reviewed. He has no known allergies.  Allergy Precautions: None required Coagulopathies: Reviewed. None identified.  Blood-thinner therapy: None at this time Active Infection(s): Reviewed. None identified. Mr. Reader is afebrile  Site Confirmation: Mr. Blatz was asked to confirm the procedure and laterality before marking the site Procedure checklist: Completed Consent: Before the procedure and under the influence of no sedative(s), amnesic(s), or anxiolytics, the patient was informed of the treatment options, risks and possible complications. To fulfill our ethical and legal obligations, as recommended by the American Medical Association's Code of Ethics, I have informed the patient of my clinical impression; the nature and purpose of the treatment or procedure; the risks,  benefits, and possible complications of the intervention; the alternatives, including doing nothing; the risk(s) and benefit(s) of the alternative treatment(s) or procedure(s); and the risk(s)  and benefit(s) of doing nothing. The patient was provided information about the general risks and possible complications associated with the procedure. These may include, but are not limited to: failure to achieve desired goals, infection, bleeding, organ or nerve damage, allergic reactions, paralysis, and death. In addition, the patient was informed of those risks and complications associated to Spine-related procedures, such as failure to decrease pain; infection (i.e.: Meningitis, epidural or intraspinal abscess); bleeding (i.e.: epidural hematoma, subarachnoid hemorrhage, or any other type of intraspinal or peri-dural bleeding); organ or nerve damage (i.e.: Any type of peripheral nerve, nerve root, or spinal cord injury) with subsequent damage to sensory, motor, and/or autonomic systems, resulting in permanent pain, numbness, and/or weakness of one or several areas of the body; allergic reactions; (i.e.: anaphylactic reaction); and/or death. Furthermore, the patient was informed of those risks and complications associated with the medications. These include, but are not limited to: allergic reactions (i.e.: anaphylactic or anaphylactoid reaction(s)); adrenal axis suppression; blood sugar elevation that in diabetics may result in ketoacidosis or comma; water retention that in patients with history of congestive heart failure may result in shortness of breath, pulmonary edema, and decompensation with resultant heart failure; weight gain; swelling or edema; medication-induced neural toxicity; particulate matter embolism and blood vessel occlusion with resultant organ, and/or nervous system infarction; and/or aseptic necrosis of one or more joints. Finally, the patient was informed that Medicine is not an exact science;  therefore, there is also the possibility of unforeseen or unpredictable risks and/or possible complications that may result in a catastrophic outcome. The patient indicated having understood very clearly. We have given the patient no guarantees and we have made no promises. Enough time was given to the patient to ask questions, all of which were answered to the patient's satisfaction. Mr. Pryce has indicated that he wanted to continue with the procedure. Attestation: I, the ordering provider, attest that I have discussed with the patient the benefits, risks, side-effects, alternatives, likelihood of achieving goals, and potential problems during recovery for the procedure that I have provided informed consent. Date  Time:   Pre-Procedure Preparation:  Monitoring: As per clinic protocol. Respiration, ETCO2, SpO2, BP, heart rate and rhythm monitor placed and checked for adequate function Safety Precautions: Patient was assessed for positional comfort and pressure points before starting the procedure. Time-out: I initiated and conducted the "Time-out" before starting the procedure, as per protocol. The patient was asked to participate by confirming the accuracy of the "Time Out" information. Verification of the correct person, site, and procedure were performed and confirmed by me, the nursing staff, and the patient. "Time-out" conducted as per Joint Commission's Universal Protocol (UP.01.01.01). Time: 1206  Description of Procedure:          Laterality: Right & LEFT (Bilateral) Levels: L3, L4, L5,Medial Branch Level(s), at the L3-4, L4-5,  lumbar facet joints. Area Prepped: Lumbosacral Prepping solution: ChloraPrep (2% chlorhexidine gluconate and 70% isopropyl alcohol) Safety Precautions: Aspiration looking for blood return was conducted prior to all injections. At no point did we inject any substances, as a needle was being advanced. Before injecting, the patient was told to immediately notify me if  he was experiencing any new onset of "ringing in the ears, or metallic taste in the mouth". No attempts were made at seeking any paresthesias. Safe injection practices and needle disposal techniques used. Medications properly checked for expiration dates. SDV (single dose vial) medications used. After the completion of the procedure, all disposable equipment used was discarded in the  proper designated Metallurgist. Local Anesthesia: Protocol guidelines were followed. The patient was positioned over the fluoroscopy table. The area was prepped in the usual manner. The time-out was completed. The target area was identified using fluoroscopy. A 12-in long, straight, sterile hemostat was used with fluoroscopic guidance to locate the targets for each level blocked. Once located, the skin was marked with an approved surgical skin marker. Once all sites were marked, the skin (epidermis, dermis, and hypodermis), as well as deeper tissues (fat, connective tissue and muscle) were infiltrated with a small amount of a short-acting local anesthetic, loaded on a 10cc syringe with a 25G, 1.5-in  Needle. An appropriate amount of time was allowed for local anesthetics to take effect before proceeding to the next step. Local Anesthetic: Lidocaine 2.0% The unused portion of the local anesthetic was discarded in the proper designated containers. Technical explanation of process:  Radiofrequency Ablation (RFA)  L3 Medial Branch Nerve RFA: The target area for the L3 medial branch is at the junction of the postero-lateral aspect of the superior articular process and the superior, posterior, and medial edge of the transverse process of L4. Under fluoroscopic guidance, a Radiofrequency needle was inserted until contact was made with os over the superior postero-lateral aspect of the pedicular shadow (target area). Sensory and motor testing was conducted to properly adjust the position of the needle. Once satisfactory  placement of the needle was achieved, the numbing solution was slowly injected after negative aspiration for blood. 2mL of the nerve block solution was injected without difficulty or complication. After waiting for at least 3 minutes, the ablation was performed. Once completed, the needle was removed intact. L4 Medial Branch Nerve RFA: The target area for the L4 medial branch is at the junction of the postero-lateral aspect of the superior articular process and the superior, posterior, and medial edge of the transverse process of L5. Under fluoroscopic guidance, a Radiofrequency needle was inserted until contact was made with os over the superior postero-lateral aspect of the pedicular shadow (target area). Sensory and motor testing was conducted to properly adjust the position of the needle. Once satisfactory placement of the needle was achieved, the numbing solution was slowly injected after negative aspiration for blood. 2mL of the nerve block solution was injected without difficulty or complication. After waiting for at least 3 minutes, the ablation was performed. Once completed, the needle was removed intact. L5 Medial Branch Nerve RFA: The target area for the L5 medial branch is at the junction of the postero-lateral aspect of the superior articular process of S1 and the superior, posterior, and medial edge of the sacral ala. Under fluoroscopic guidance, a Radiofrequency needle was inserted until contact was made with os over the superior postero-lateral aspect of the pedicular shadow (target area). Sensory and motor testing was conducted to properly adjust the position of the needle. Once satisfactory placement of the needle was achieved, the numbing solution was slowly injected after negative aspiration for blood.2 mL of the nerve block solution was injected without difficulty or complication. After waiting for at least 3 minutes, the ablation was performed. Once completed, the needle was removed  intact.   Radiofrequency lesioning (ablation):  Radiofrequency Generator:  [employer] Sensory Stimulation Parameters: 50 Hz was used to locate & identify the nerve, making sure that the needle was positioned such that there was no sensory stimulation below 0.3 V or above 0.7 V. Motor Stimulation Parameters: 2 Hz was used to evaluate the motor component. Care was taken  not to lesion any nerves that demonstrated motor stimulation of the lower extremities at an output of less than 2.5 times that of the sensory threshold, or a maximum of 2.0 V. Lesioning Technique Parameters: Standard Radiofrequency settings. (Not bipolar or pulsed.) Temperature Settings: 80 degrees C Lesioning time: 60 seconds Intra-operative Compliance: Compliant Materials & Medications: Needle(s) (Electrode/Cannula) Type: Teflon-coated, curved tip, Radiofrequency needle(s) Gauge: 22G Length: 10cm Numbing solution: 12 cc solution made of 10cc of 0.2% ropivacaine, 2 cc of Decadron 10 mg/cc.  2 cc injected at each level above on the right& left prior to ablation.  The unused portion of the solution was discarded in the proper designated containers.  Once the entire procedure was completed, the treated area was cleaned, making sure to leave some of the prepping solution back to take advantage of its long term bactericidal properties.  Illustration of the posterior view of the lumbar spine and the posterior neural structures. Laminae of L2 through S1 are labeled. DPRL5, dorsal primary ramus of L5; DPRS1, dorsal primary ramus of S1; DPR3, dorsal primary ramus of L3; FJ, facet (zygapophyseal) joint L3-L4; I, inferior articular process of L4; LB1, lateral branch of dorsal primary ramus of L1; IAB, inferior articular branches from L3 medial branch (supplies L4-L5 facet joint); IBP, intermediate branch plexus; MB3, medial branch of dorsal primary ramus of L3; NR3, third lumbar nerve root; S, superior articular process of L5; SAB, superior  articular branches from L4 (supplies L4-5 facet joint also); TP3, transverse process of L3.  Vitals:   11/13/23 1225 11/13/23 1230 11/13/23 1233 11/13/23 1240  BP: 108/70 106/75 105/75 113/72  Pulse:    78  Resp: 15 18 15 16   Temp:      SpO2: 99% 100% 100% 100%  Weight:      Height:        Start Time: 1206 hrs. End Time: 1233 hrs.  Imaging Guidance (Spinal):          Type of Imaging Technique: Fluoroscopy Guidance (Spinal) Indication(s): Assistance in needle guidance and placement for procedures requiring needle placement in or near specific anatomical locations not easily accessible without such assistance. Exposure Time: Please see nurses notes. Contrast: None used. Fluoroscopic Guidance: I was personally present during the use of fluoroscopy. "Tunnel Vision Technique" used to obtain the best possible view of the target area. Parallax error corrected before commencing the procedure. "Direction-depth-direction" technique used to introduce the needle under continuous pulsed fluoroscopy. Once target was reached, antero-posterior, oblique, and lateral fluoroscopic projection used confirm needle placement in all planes. Images permanently stored in EMR. Interpretation: No contrast injected. I personally interpreted the imaging intraoperatively. Adequate needle placement confirmed in multiple planes. Permanent images saved into the patient's record.   Post-operative Assessment:  Post-procedure Vital Signs:  Pulse/HCG Rate: 7869 Temp: 98.1 F (36.7 C) Resp: 16 BP: 113/72 SpO2: 100 %  EBL: None  Complications: No immediate post-treatment complications observed by team, or reported by patient.  Note:  Patient had a slight vasovagal reaction towards the end with decreased BP and HP, given glycopyrolate 0.2 mg, sx improved. Otherwise,  The patient tolerated the entire procedure well. A repeat set of vitals were taken after the procedure and the patient was kept under observation  following institutional policy, for this type of procedure. Post-procedural neurological assessment was performed, showing return to baseline, prior to discharge. The patient was provided with post-procedure discharge instructions, including a section on how to identify potential problems. Should any problems arise concerning this procedure,  the patient was given instructions to immediately contact us, at any time, without hesitation. In any case, we plan to contact the patient by telephone for a follow-up status report regarding this interventional procedure.  Comments:  No additional relevant information.  Plan of Care  Orders:  Orders Placed This Encounter  Procedures   DG PAIN CLINIC C-ARM 1-60 MIN NO REPORT    Intraoperative interpretation by procedural physician at [facility].    Standing Status:   Standing    Number of Occurrences:   1    Reason for exam::   Assistance in needle guidance and placement for procedures requiring needle placement in or near specific anatomical locations not easily accessible without such assistance.    Patient instructed to restart Xarelto tomorrow so long as he is not having any LE weakness.    Medications ordered for procedure: Meds ordered this encounter  Medications   lidocaine (XYLOCAINE) 2 % (with pres) injection 400 mg   lactated ringers infusion   midazolam (VERSED) injection 0.5-2 mg    Make sure Flumazenil is available in the pyxis when using this medication. If oversedation occurs, administer 0.2 mg IV over 15 sec. If after 45 sec no response, administer 0.2 mg again over 1 min; may repeat at 1 min intervals; not to exceed 4 doses (1 mg)   ropivacaine (PF) 2 mg/mL (0.2%) (NAROPIN) injection 18 mL   dexamethasone (DECADRON) injection 20 mg    Medications administered: Koren Bound had no medications administered during this visit.  See the medical record for exact dosing, route, and time of administration.  Disposition:  Discharge home  Discharge Date & Time: 11/13/2023; 1245 hrs.   Follow-up plan:   Return in about 6 weeks (around 12/25/2023) for PPE VV.     Future Appointments  Date Time Provider Department Center  01/01/2024  3:00 PM Edward Jolly, MD [facility] None  03/23/2024  1:45 PM Elie Goody, MD [facility] None   Primary Care Physician: Lynnea Ferrier, MD Location: [facility] Note by: Edward Jolly, MD Date: 11/13/2023; Time: 12:58 PM  Disclaimer:  Medicine is not an exact science. The only guarantee in medicine is that nothing is guaranteed. It is important to note that the decision to proceed with this intervention was based on the information collected from the patient. The Data and conclusions were drawn from the patient's questionnaire, the interview, and the physical examination. Because the information was provided in large part by the patient, it cannot be guaranteed that it has not been purposely or unconsciously manipulated. Every effort has been made to obtain as much relevant data as possible for this evaluation. It is important to note that the conclusions that lead to this procedure are derived in large part from the available data. Always take into account that the treatment will also be dependent on availability of resources and existing treatment guidelines, considered by other Pain Management Practitioners as being common knowledge and practice, at the time of the intervention. For Medico-Legal purposes, it is also important to point out that variation in procedural techniques and pharmacological choices are the acceptable norm. The indications, contraindications, technique, and results of the above procedure should only be interpreted and judged by a Board-Certified Interventional Pain Specialist with extensive familiarity and expertise in the same exact procedure and technique.

## 2023-11-13 NOTE — Patient Instructions (Signed)

## 2023-11-14 ENCOUNTER — Telehealth: Payer: Self-pay

## 2023-11-14 ENCOUNTER — Encounter (HOSPITAL_BASED_OUTPATIENT_CLINIC_OR_DEPARTMENT_OTHER): Admitting: Nurse Practitioner

## 2023-11-14 NOTE — Telephone Encounter (Signed)
 Attempt to contact for post-procedure f/u. No answer and LVM.

## 2023-12-25 ENCOUNTER — Telehealth: Admitting: Student in an Organized Health Care Education/Training Program

## 2023-12-31 ENCOUNTER — Telehealth: Payer: Self-pay

## 2023-12-31 NOTE — Telephone Encounter (Signed)
Attempted to call for pre virtual appointment questions. LM

## 2024-01-01 ENCOUNTER — Ambulatory Visit
Attending: Student in an Organized Health Care Education/Training Program | Admitting: Student in an Organized Health Care Education/Training Program

## 2024-01-01 ENCOUNTER — Encounter: Payer: Self-pay | Admitting: Student in an Organized Health Care Education/Training Program

## 2024-01-01 DIAGNOSIS — M47816 Spondylosis without myelopathy or radiculopathy, lumbar region: Secondary | ICD-10-CM

## 2024-01-01 NOTE — Progress Notes (Signed)
I attempted to call the patient however no response. Voicemail left instructing patient to call front desk office at [phone] to reschedule appointment. -Dr Yamileth Hayse  

## 2024-02-19 ENCOUNTER — Ambulatory Visit
Payer: Self-pay | Attending: Student in an Organized Health Care Education/Training Program | Admitting: Student in an Organized Health Care Education/Training Program

## 2024-02-19 DIAGNOSIS — M47816 Spondylosis without myelopathy or radiculopathy, lumbar region: Secondary | ICD-10-CM

## 2024-02-19 NOTE — Progress Notes (Signed)
 I attempted to call the patient however no response. Voicemail left instructing patient to call front desk office at [phone] to reschedule appointment. -Dr Marcelino  Type: Thermal Lumbar Facet, Medial Branch Radiofrequency Ablation/Neurotomy  #7 Primary Purpose: Therapeutic Region: Posterolateral Lumbosacral Spine Level:  L3, L4, L5, Medial Branch Level(s). These levels will denervate the L3-4, L4-5,lumbar facet joints. Laterality: Bilatera Effectiveness: Initial hour after procedure: 75 %  Subsequent 4-6 hours post-procedure: 75 %  Analgesia past initial 6 hours: 75 % (lasting 4-5 months)     Note by: Wallie Marcelino, MD Date: 02/19/2024; Time: 3:40 PM

## 2024-02-24 ENCOUNTER — Telehealth: Payer: Self-pay

## 2024-02-24 ENCOUNTER — Ambulatory Visit
Attending: Student in an Organized Health Care Education/Training Program | Admitting: Student in an Organized Health Care Education/Training Program

## 2024-02-24 DIAGNOSIS — M47816 Spondylosis without myelopathy or radiculopathy, lumbar region: Secondary | ICD-10-CM

## 2024-02-24 NOTE — Progress Notes (Signed)
I attempted to call the patient however no response. Voicemail left instructing patient to call front desk office at [phone] to reschedule appointment. -Dr Yamileth Hayse  

## 2024-02-24 NOTE — Telephone Encounter (Signed)
 Attempted to call for pre-virtual appointment questions. No answer and LVM to return call.

## 2024-02-25 ENCOUNTER — Ambulatory Visit
Attending: Student in an Organized Health Care Education/Training Program | Admitting: Student in an Organized Health Care Education/Training Program

## 2024-02-25 DIAGNOSIS — M47816 Spondylosis without myelopathy or radiculopathy, lumbar region: Secondary | ICD-10-CM

## 2024-02-25 NOTE — Progress Notes (Signed)
 No response

## 2024-03-12 ENCOUNTER — Encounter: Payer: Self-pay | Admitting: Student in an Organized Health Care Education/Training Program

## 2024-03-12 ENCOUNTER — Ambulatory Visit
Attending: Student in an Organized Health Care Education/Training Program | Admitting: Student in an Organized Health Care Education/Training Program

## 2024-03-12 VITALS — BP 99/68 | HR 71 | Temp 97.3°F | Resp 18 | Ht 71.0 in | Wt 250.0 lb

## 2024-03-12 DIAGNOSIS — G894 Chronic pain syndrome: Secondary | ICD-10-CM | POA: Insufficient documentation

## 2024-03-12 DIAGNOSIS — M47816 Spondylosis without myelopathy or radiculopathy, lumbar region: Secondary | ICD-10-CM | POA: Insufficient documentation
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
## 2024-03-12 NOTE — Patient Instructions (Addendum)
 ______________________________________________________________________    Preparing for your procedure  Appointments: If you think you may not be able to keep your appointment, call 24-48 hours in advance to cancel. We need time to make it available to others.  Procedure visits are for procedures only. During your procedure appointment there will be: NO Prescription Refills*. NO medication changes or discussions*. NO discussion of disability issues*. NO unrelated pain problem evaluations*. NO evaluations to order other pain procedures*. *These will be addressed at a separate and distinct evaluation encounter on the provider's evaluation schedule and not during procedure days.  Instructions: Food intake: Avoid eating anything solid for at least 6 hours prior to your procedure. Clear liquid intake: You may take clear liquids such as water up to 2 hours prior to your procedure. (No carbonated drinks. No soda.) Transportation: Unless otherwise stated by your physician, bring a driver. (Driver cannot be a Market researcher, Pharmacist, community, or any other form of public transportation.) Morning Medicines: Except for blood thinners, take all of your other morning medications with a sip of water. Make sure to take your heart and blood pressure medicines. If your blood pressure's lower number is above 100, the case will be rescheduled. Blood thinners: Make sure to stop your blood thinners as instructed.  If you take a blood thinner, but were not instructed to stop it, call our office [phone] and ask to talk to a nurse. Not stopping a blood thinner prior to certain procedures could lead to serious complications. Diabetics on insulin: Notify the staff so that you can be scheduled 1st case in the morning. If your diabetes requires high dose insulin, take only  of your normal insulin dose the morning of the procedure and notify the staff that you have done so. Preventing infections: Shower with an antibacterial soap the  morning of your procedure.  Build-up your immune system: Take 1000 mg of Vitamin C with every meal (3 times a day) the day prior to your procedure. Antibiotics: Inform the nursing staff if you are taking any antibiotics or if you have any conditions that may require antibiotics prior to procedures. (Example: recent joint implants)   Pregnancy: If you are pregnant make sure to notify the nursing staff. Not doing so may result in injury to the fetus, including death.  Sickness: If you have a cold, fever, or any active infections, call and cancel or reschedule your procedure. Receiving steroids while having an infection may result in complications. Arrival: You must be in the facility at least 30 minutes prior to your scheduled procedure. Tardiness: Your scheduled time is also the cutoff time. If you do not arrive at least 15 minutes prior to your procedure, you will be rescheduled.  Children: Do not bring any children with you. Make arrangements to keep them home. Dress appropriately: There is always a possibility that your clothing may get soiled. Avoid long dresses. Valuables: Do not bring any jewelry or valuables.  Reasons to call and reschedule or cancel your procedure: (Following these recommendations will minimize the risk of a serious complication.) Surgeries: Avoid having procedures within 2 weeks of any surgery. (Avoid for 2 weeks before or after any surgery). Flu Shots: Avoid having procedures within 2 weeks of a flu shots or . (Avoid for 2 weeks before or after immunizations). Barium: Avoid having a procedure within 7-10 days after having had a radiological study involving the use of radiological contrast. (Myelograms, Barium swallow or enema study). Heart attacks: Avoid any elective procedures or surgeries for the  initial 6 months after a Myocardial Infarction (Heart Attack). Blood thinners: It is imperative that you stop these medications before procedures. Let us  know if you if you take  any blood thinner.  Infection: Avoid procedures during or within two weeks of an infection (including chest colds or gastrointestinal problems). Symptoms associated with infections include: Localized redness, fever, chills, night sweats or profuse sweating, burning sensation when voiding, cough, congestion, stuffiness, runny nose, sore throat, diarrhea, nausea, vomiting, cold or Flu symptoms, recent or current infections. It is specially important if the infection is over the area that we intend to treat. Heart and lung problems: Symptoms that may suggest an active cardiopulmonary problem include: cough, chest pain, breathing difficulties or shortness of breath, dizziness, ankle swelling, uncontrolled high or unusually low blood pressure, and/or palpitations. If you are experiencing any of these symptoms, cancel your procedure and contact your primary care physician for an evaluation.  Remember:  Regular Business hours are:  Monday to Thursday 8:00 AM to 4:00 PM  Provider's Schedule: Eric Como, MD:  Procedure days: Tuesday and Thursday 7:30 AM to 4:00 PM  Wallie Sherry, MD:  Procedure days: Monday and Wednesday 7:30 AM to 4:00 PM Last  Updated: 07/16/2023 ______________________________________________________________________    Stop Xarelto  3 days before procedure.

## 2024-03-12 NOTE — Progress Notes (Signed)
 Safety precautions to be maintained throughout the outpatient stay will include: orient to surroundings, keep bed in low position, maintain call bell within reach at all times, provide assistance with transfer out of bed and ambulation.

## 2024-03-23 ENCOUNTER — Ambulatory Visit: Payer: 59 | Admitting: Dermatology

## 2024-03-23 ENCOUNTER — Encounter: Payer: Self-pay | Admitting: Dermatology

## 2024-03-23 DIAGNOSIS — D229 Melanocytic nevi, unspecified: Secondary | ICD-10-CM

## 2024-03-23 DIAGNOSIS — Z85828 Personal history of other malignant neoplasm of skin: Secondary | ICD-10-CM

## 2024-03-23 DIAGNOSIS — L578 Other skin changes due to chronic exposure to nonionizing radiation: Secondary | ICD-10-CM | POA: Diagnosis not present

## 2024-03-23 DIAGNOSIS — L821 Other seborrheic keratosis: Secondary | ICD-10-CM

## 2024-03-23 DIAGNOSIS — L570 Actinic keratosis: Secondary | ICD-10-CM

## 2024-03-23 DIAGNOSIS — Z79899 Other long term (current) drug therapy: Secondary | ICD-10-CM

## 2024-03-23 DIAGNOSIS — Z1283 Encounter for screening for malignant neoplasm of skin: Secondary | ICD-10-CM | POA: Diagnosis not present

## 2024-03-23 DIAGNOSIS — W908XXA Exposure to other nonionizing radiation, initial encounter: Secondary | ICD-10-CM

## 2024-03-23 DIAGNOSIS — D1801 Hemangioma of skin and subcutaneous tissue: Secondary | ICD-10-CM | POA: Diagnosis not present

## 2024-03-23 DIAGNOSIS — L814 Other melanin hyperpigmentation: Secondary | ICD-10-CM

## 2024-03-23 DIAGNOSIS — Z7189 Other specified counseling: Secondary | ICD-10-CM

## 2024-03-23 MED ORDER — FLUOROURACIL 5 % EX CREA
TOPICAL_CREAM | Freq: Two times a day (BID) | CUTANEOUS | 2 refills | Status: AC
Start: 2024-03-23 — End: ?

## 2024-03-23 MED ORDER — FLUOROURACIL 5 % EX CREA: TOPICAL_CREAM | Freq: Two times a day (BID) | CUTANEOUS | 2 refills | Status: DC

## 2024-03-23 NOTE — Progress Notes (Signed)
 Follow-Up Visit   Subjective  [name] is a 58 y.o. male who presents for the following: Skin Cancer Screening and Full Body Skin Exam. Hx of SCC, BCC, AK's. Patient has one area of concern on his L temple.   The patient presents for Total-Body Skin Exam (TBSE) for skin cancer screening and mole check. The patient has spots, moles and lesions to be evaluated, some may be new or changing and the patient may have concern these could be cancer.    The following portions of the chart were reviewed this encounter and updated as appropriate: medications, allergies, medical history  Review of Systems:  No other skin or systemic complaints except as noted in HPI or Assessment and Plan.  Objective  Well appearing patient in no apparent distress; mood and affect are within normal limits.  A full examination was performed including scalp, head, eyes, ears, nose, lips, neck, chest, axillae, abdomen, back, buttocks, bilateral upper extremities, bilateral lower extremities, hands, feet, fingers, toes, fingernails, and toenails. All findings within normal limits unless otherwise noted below.   Relevant physical exam findings are noted in the Assessment and Plan.  Vertex scalp x1, R midhelix x1 (2) Erythematous thin papules/macules with gritty scale.   Assessment & Plan   SKIN CANCER SCREENING PERFORMED TODAY.  ACTINIC DAMAGE - Chronic condition, secondary to cumulative UV/sun exposure - diffuse scaly erythematous macules with underlying dyspigmentation - Recommend daily broad spectrum sunscreen SPF 30+ to sun-exposed areas, reapply every 2 hours as needed.  - Staying in the shade or wearing long sleeves, sun glasses (UVA+UVB protection) and wide brim hats (4-inch brim around the entire circumference of the hat) are also recommended for sun protection.  - Call for new or changing lesions.  LENTIGINES, SEBORRHEIC KERATOSES, HEMANGIOMAS - Benign normal skin lesions -  Benign-appearing - Call for any changes  MELANOCYTIC NEVI - Tan-brown and/or pink-flesh-colored symmetric macules and papules - Benign appearing on exam today - Observation - Call clinic for new or changing moles - Recommend daily use of broad spectrum spf 30+ sunscreen to sun-exposed areas.   HISTORY OF BASAL CELL CARCINOMA OF THE SKIN Multiple, see history - No evidence of recurrence today - Recommend regular full body skin exams - Recommend daily broad spectrum sunscreen SPF 30+ to sun-exposed areas, reapply every 2 hours as needed.  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  - Call if any new or changing lesions are noted between office visits   ACTINIC KERATOSIS (2) Vertex scalp x1, R midhelix x1 (2) Destruction of lesion - Vertex scalp x1, R midhelix x1 (2) Complexity: simple   Destruction method: cryotherapy   Informed consent: discussed and consent obtained   Timeout:  patient name, date of birth, surgical site, and procedure verified Lesion destroyed using liquid nitrogen: Yes   Region frozen until ice ball extended beyond lesion: Yes   Cryo cycles: 1 or 2. Outcome: patient tolerated procedure well with no complications   Post-procedure details: wound care instructions given   Additional details:  Prior to procedure, discussed risks of blister formation, small wound, skin dyspigmentation, or rare scar  following cryotherapy. Recommend Vaseline ointment to treated areas while healing.   ACTINIC DAMAGE WITH PRECANCEROUS ACTINIC KERATOSES, improving but not at goal Counseling for Topical Chemotherapy  Management: Patient exhibits: - Severe, confluent actinic changes with pre-cancerous actinic keratoses that is secondary to cumulative UV radiation exposure over time - Condition that is severe; chronic, not at goal. - diffuse scaly erythematous macules and papules with underlying dyspigmentation - Discussed Prescription Field Treatment topical Chemotherapy for Severe, Chronic Confluent Actinic Changes with Pre-Cancerous Actinic Keratoses Field treatment involves treatment of an entire area of skin that has confluent Actinic Changes (Sun/ Ultraviolet light damage) and PreCancerous Actinic Keratoses by method of PhotoDynamic Therapy (PDT) and/or prescription Topical Chemotherapy agents such as 5-fluorouracil , 5-fluorouracil /calcipotriene, and/or imiquimod.  The purpose is to decrease the number of clinically evident and subclinical PreCancerous lesions to prevent progression to development of skin cancer by chemically destroying early precancer changes that may or may not be visible.  It has been shown to reduce the risk of developing skin cancer in the treated area. As a result of treatment, redness, scaling, crusting, and open sores may occur during treatment course. One or more than one of these methods may be used and may have to be used several times to control, suppress and eliminate the PreCancerous changes. Discussed treatment course, expected reaction, and possible side effects. - Recommend daily broad spectrum sunscreen SPF 30+ to sun-exposed areas, reapply every 2 hours as needed.  - Staying in the shade or wearing long sleeves, sun glasses (UVA+UVB protection) and wide brim hats (4-inch brim around the entire circumference of the hat) are also recommended. - Call for new or changing lesions. - restart 33fu calcip temples nose  Return in about 6 months (around 09/23/2024) for TBSE.  I, Emerick Ege, CMA am acting as scribe for Boneta Sharps, MD.  Documentation: I have reviewed the  above documentation for accuracy and completeness, and I agree with the above.  Boneta Sharps, MD

## 2024-03-23 NOTE — Patient Instructions (Addendum)
 - Start 5-fluorouracil  cream twice a day for up to 7 days days to affected areas including nose and temples.    5-Fluorouracil /Calcipotriene Patient Education   Actinic keratoses are the dry, red scaly spots on the skin caused by sun damage. A portion of these spots can turn into skin cancer with time, and treating them can help prevent development of skin cancer.   Treatment of these spots requires removal of the defective skin cells. There are various ways to remove actinic keratoses, including freezing with liquid nitrogen, treatment with creams, or treatment with a blue light procedure in the office.   5-fluorouracil  cream is a topical cream used to treat actinic keratoses. It works by interfering with the growth of abnormal fast-growing skin cells, such as actinic keratoses. These cells peel off and are replaced by healthy ones.   5-fluorouracil /calcipotriene is a combination of the 5-fluorouracil  cream with a vitamin D analog cream called calcipotriene. The calcipotriene alone does not treat actinic keratoses. However, when it is combined with 5-fluorouracil , it helps the 5-fluorouracil  treat the actinic keratoses much faster so that the same results can be achieved with a much shorter treatment time.  INSTRUCTIONS FOR 5-FLUOROURACIL /CALCIPOTRIENE CREAM:   5-fluorouracil /calcipotriene cream typically only needs to be used for 4-7 days. A thin layer should be applied twice a day to the treatment areas recommended by your physician.   If your physician prescribed you separate tubes of 5-fluourouracil and calcipotriene, apply a thin layer of 5-fluorouracil  followed by a thin layer of calcipotriene.   Avoid contact with your eyes, nostrils, and mouth. Do not use 5-fluorouracil /calcipotriene cream on infected or open wounds.   You will develop redness, irritation and some crusting at areas where you have pre-cancer damage/actinic keratoses. IF YOU DEVELOP PAIN, BLEEDING, OR SIGNIFICANT CRUSTING,  STOP THE TREATMENT EARLY - you have already gotten a good response and the actinic keratoses should clear up well.  Wash your hands after applying 5-fluorouracil  5% cream on your skin.   A moisturizer or sunscreen with a minimum SPF 30 should be applied each morning.   Once you have finished the treatment, you can apply a thin layer of Vaseline twice a day to irritated areas to soothe and calm the areas more quickly. If you experience significant discomfort, contact your physician.  For some patients it is necessary to repeat the treatment for best results.  SIDE EFFECTS: When using 5-fluorouracil /calcipotriene cream, you may have mild irritation, such as redness, dryness, swelling, or a mild burning sensation. This usually resolves within 2 weeks. The more actinic keratoses you have, the more redness and inflammation you can expect during treatment. Eye irritation has been reported rarely. If this occurs, please let us  know.  If you have any trouble using this cream, please call the office. If you have any other questions about this information, please do not hesitate to ask me before you leave the office.   [facility]  [address] [place], [place] [address]  Phone: [phone]   Cryotherapy Aftercare  Wash gently with soap and water everyday.   Apply Vaseline and Band-Aid daily until healed.    Recommend daily broad spectrum sunscreen SPF 30+ to sun-exposed areas, reapply every 2 hours as needed. Call for new or changing lesions.  Staying in the shade or wearing long sleeves, sun glasses (UVA+UVB protection) and wide brim hats (4-inch brim around the entire circumference of the hat) are also recommended for sun protection.    Melanoma ABCDEs  Melanoma is the  most dangerous type of skin cancer, and is the leading cause of death from skin disease.  You are more likely to develop melanoma if you: Have light-colored skin, light-colored eyes, or red or blond hair Spend a  lot of time in the sun Tan regularly, either outdoors or in a tanning bed Have had blistering sunburns, especially during childhood Have a close family member who has had a melanoma Have atypical moles or large birthmarks  Early detection of melanoma is key since treatment is typically straightforward and cure rates are extremely high if we catch it early.   The first sign of melanoma is often a change in a mole or a new dark spot.  The ABCDE system is a way of remembering the signs of melanoma.  A for asymmetry:  The two halves do not match. B for border:  The edges of the growth are irregular. C for color:  A mixture of colors are present instead of an even brown color. D for diameter:  Melanomas are usually (but not always) greater than 6mm - the size of a pencil eraser. E for evolution:  The spot keeps changing in size, shape, and color.  Please check your skin once per month between visits. You can use a small mirror in front and a large mirror behind you to keep an eye on the back side or your body.   If you see any new or changing lesions before your next follow-up, please call to schedule a visit.  Please continue daily skin protection including broad spectrum sunscreen SPF 30+ to sun-exposed areas, reapplying every 2 hours as needed when you're outdoors.     Due to recent changes in healthcare laws, you may see results of your pathology and/or laboratory studies on MyChart before the doctors have had a chance to review them. We understand that in some cases there may be results that are confusing or concerning to you. Please understand that not all results are received at the same time and often the doctors may need to interpret multiple results in order to provide you with the best plan of care or course of treatment. Therefore, we ask that you please give us  2 business days to thoroughly review all your results before contacting the office for clarification. Should we see a critical  lab result, you will be contacted sooner.   If You Need Anything After Your Visit  If you have any questions or concerns for your doctor, please call our main line at [phone] and press option 4 to reach your doctor's medical assistant. If no one answers, please leave a voicemail as directed and we will return your call as soon as possible. Messages left after 4 pm will be answered the following business day.   You may also send us  a message via MyChart. We typically respond to MyChart messages within 1-2 business days.  For prescription refills, please ask your pharmacy to contact our office. Our fax number is [phone].  If you have an urgent issue when the clinic is closed that cannot wait until the next business day, you can page your doctor at the number below.    Please note that while we do our best to be available for urgent issues outside of office hours, we are not available 24/7.   If you have an urgent issue and are unable to reach us , you may choose to seek medical care at your doctor's office, retail clinic, urgent care center, or emergency room.  If you have a medical emergency, please immediately call 911 or go to the emergency department.  Pager Numbers  - Dr. Hester: [phone]  - Dr. Jackquline: [phone]  - Dr. Claudene: [phone]   In the event of inclement weather, please call our main line at [phone] for an update on the status of any delays or closures.  Dermatology Medication Tips: Please keep the boxes that topical medications come in in order to help keep track of the instructions about where and how to use these. Pharmacies typically print the medication instructions only on the boxes and not directly on the medication tubes.   If your medication is too expensive, please contact our office at [phone] option 4 or send us  a message through MyChart.   We are unable to tell what your co-pay for medications will be in advance as this is  different depending on your insurance coverage. However, we may be able to find a substitute medication at lower cost or fill out paperwork to get insurance to cover a needed medication.   If a prior authorization is required to get your medication covered by your insurance company, please allow us  1-2 business days to complete this process.  Drug prices often vary depending on where the prescription is filled and some pharmacies may offer cheaper prices.  The website www.goodrx.com contains coupons for medications through different pharmacies. The prices here do not account for what the cost may be with help from insurance (it may be cheaper with your insurance), but the website can give you the price if you did not use any insurance.  - You can print the associated coupon and take it with your prescription to the pharmacy.  - You may also stop by our office during regular business hours and pick up a GoodRx coupon card.  - If you need your prescription sent electronically to a different pharmacy, notify our office through [facility] or by phone at [phone] option 4.     Si Usted Necesita Algo Despus de Su Visita  Tambin puede enviarnos un mensaje a travs de Clinical cytogeneticist. Por lo general respondemos a los mensajes de MyChart en el transcurso de 1 a 2 das hbiles.  Para renovar recetas, por favor pida a su farmacia que se ponga en contacto con nuestra oficina. Randi lakes de fax es [place] [phone].  Si tiene un asunto urgente cuando la clnica est cerrada y que no puede esperar hasta el siguiente da hbil, puede llamar/localizar a su doctor(a) al nmero que aparece a continuacin.   Por favor, tenga en cuenta que aunque hacemos todo lo posible para estar disponibles para asuntos urgentes fuera del horario de [place], no estamos disponibles las 24 horas del da, los 7 [address]  [address] de la [place].   Si tiene un problema urgente y no puede comunicarse con nosotros, puede optar por buscar  atencin mdica  en el consultorio de su doctor(a), en una clnica privada, en un centro de atencin urgente o en una sala de emergencias.  Si tiene Engineer, drilling, por favor llame inmediatamente al 911 o vaya a la sala de emergencias.  Nmeros de bper  - Dr. Hester: [phone]  - Dra. Jackquline: [phone]  - Dr. Claudene: [phone]   En caso de inclemencias del tiempo, por favor llame a landry capes principal al [phone] para una actualizacin sobre el [place] de cualquier retraso o cierre.  Consejos para la medicacin en dermatologa: Por favor, guarde las cajas en las que vienen los  medicamentos de uso tpico para ayudarle a seguir las [employer] dnde y cmo usarlos. Las farmacias generalmente imprimen las instrucciones del medicamento slo en las cajas y no directamente en los tubos del [place].   Si su medicamento es muy caro, por favor, pngase en contacto con landry rieger llamando al [phone] y presione la opcin 4 o envenos un mensaje a travs de Clinical cytogeneticist.   No podemos decirle cul ser su copago por los medicamentos por adelantado ya que esto es diferente dependiendo de la cobertura de su seguro. Sin embargo, es posible que podamos encontrar un medicamento sustituto a Audiological scientist un formulario para que el seguro cubra el medicamento que se considera necesario.   Si se requiere una autorizacin previa para que su compaa de seguros malta su medicamento, por favor permtanos de 1 a 2 das hbiles para completar este proceso.  Los precios de los medicamentos varan con frecuencia dependiendo del Environmental consultant de dnde se surte la receta y alguna farmacias pueden ofrecer precios ms baratos.  El sitio web www.goodrx.com tiene cupones para medicamentos de Health and safety inspector. Los precios aqu no tienen en cuenta lo que podra costar con la ayuda del seguro (puede ser ms barato con su seguro), pero el sitio web puede darle el precio si no utiliz  Tourist information centre manager.  - Puede imprimir el cupn correspondiente y llevarlo con su receta a la farmacia.  - Tambin puede pasar por nuestra oficina durante el horario de atencin regular y Education officer, museum una tarjeta de cupones de GoodRx.  - Si necesita que su receta se enve electrnicamente a una farmacia diferente, informe a nuestra oficina a travs de MyChart de [place] o por telfono llamando al [phone] y presione la opcin 4.

## 2024-04-01 ENCOUNTER — Ambulatory Visit
Admission: RE | Admit: 2024-04-01 | Discharge: 2024-04-01 | Disposition: A | Discharge status: Home / Self Care | Source: Ambulatory Visit | Attending: Student in an Organized Health Care Education/Training Program | Admitting: Student in an Organized Health Care Education/Training Program

## 2024-04-01 ENCOUNTER — Ambulatory Visit (HOSPITAL_BASED_OUTPATIENT_CLINIC_OR_DEPARTMENT_OTHER): Admitting: Student in an Organized Health Care Education/Training Program

## 2024-04-01 ENCOUNTER — Encounter: Payer: Self-pay | Admitting: Student in an Organized Health Care Education/Training Program

## 2024-04-01 VITALS — BP 103/61 | HR 68 | Temp 97.2°F | Resp 16 | Ht 70.0 in | Wt 250.0 lb

## 2024-04-01 DIAGNOSIS — M47816 Spondylosis without myelopathy or radiculopathy, lumbar region: Secondary | ICD-10-CM | POA: Diagnosis present

## 2024-04-01 DIAGNOSIS — G894 Chronic pain syndrome: Secondary | ICD-10-CM

## 2024-04-01 MED ORDER — MIDAZOLAM HCL 2 MG/2ML IJ SOLN
INTRAMUSCULAR | Status: AC
  Filled 2024-04-01: qty 2

## 2024-04-01 MED ORDER — LACTATED RINGERS IV SOLN: Freq: Once | INTRAVENOUS | Status: AC

## 2024-04-01 MED ORDER — DEXAMETHASONE SODIUM PHOSPHATE 10 MG/ML IJ SOLN
20.0000 mg | Freq: Once | INTRAMUSCULAR | Status: AC
  Administered 2024-04-01: 20 mg

## 2024-04-01 MED ORDER — ROPIVACAINE HCL 2 MG/ML IJ SOLN
INTRAMUSCULAR | Status: AC
  Filled 2024-04-01: qty 20

## 2024-04-01 MED ORDER — DEXAMETHASONE SODIUM PHOSPHATE 10 MG/ML IJ SOLN
INTRAMUSCULAR | Status: AC
  Filled 2024-04-01: qty 2

## 2024-04-01 MED ORDER — LIDOCAINE HCL 2 % IJ SOLN
20.0000 mL | Freq: Once | INTRAMUSCULAR | Status: AC
  Administered 2024-04-01: 400 mg

## 2024-04-01 MED ORDER — LIDOCAINE HCL 2 % IJ SOLN
INTRAMUSCULAR | Status: AC
  Filled 2024-04-01: qty 20

## 2024-04-01 MED ORDER — MIDAZOLAM HCL 2 MG/2ML IJ SOLN
0.5000 mg | Freq: Once | INTRAMUSCULAR | Status: AC
  Administered 2024-04-01: 2 mg via INTRAVENOUS

## 2024-04-01 MED ORDER — ROPIVACAINE HCL 2 MG/ML IJ SOLN
18.0000 mL | Freq: Once | INTRAMUSCULAR | Status: AC
  Administered 2024-04-01: 18 mL via PERINEURAL

## 2024-04-01 NOTE — Patient Instructions (Addendum)
 Post-procedure Information What to expect: Most procedures involve the use of a local anesthetic (numbing medicine), and a steroid (anti-inflammatory medicine).  The local anesthetics may cause temporary numbness and weakness of the legs or arms, depending on the location of the block. This numbness/weakness may last 4-6 hours, depending on the local anesthetic used. In rare instances, it can last up to 24 hours. While numb, you must be very careful not to injure the extremity.  After any procedure, you could expect the pain to get better within 15-20 minutes. This relief is temporary and may last 4-6 hours. Once the local anesthetics wears off, you could experience discomfort, possibly more than usual, for up to 10 (ten) days. In the case of radiofrequencies, it may last up to 6 weeks. Surgeries may take up to 8 weeks for the healing process. The discomfort is due to the irritation caused by needles going through skin and muscle. To minimize the discomfort, we recommend using ice the first day, and heat from then on. The ice should be applied for 15 minutes on, and 15 minutes off. Keep repeating this cycle until bedtime. Avoid applying the ice directly to the skin, to prevent frostbite. Heat should be used daily, until the pain improves (4-10 days). Be careful not to burn yourself.  Occasionally you may experience muscle spasms or cramps. These occur as a consequence of the irritation caused by the needle sticks to the muscle and the blood that will inevitably be lost into the surrounding muscle tissue. Blood tends to be very irritating to tissues, which tend to react by going into spasm. These spasms may start the same day of your procedure, but they may also take days to develop. This late onset type of spasm or cramp is usually caused by electrolyte imbalances triggered by the steroids, at the level of the kidney. Cramps and spasms tend to respond well to muscle relaxants, multivitamins (some are  triggered by the procedure, but may have their origins in vitamin deficiencies), and "Gatorade", or any sports drinks that can replenish any electrolyte imbalances. (If you are a diabetic, ask your pharmacist to get you a sugar-free brand.) Warm showers or baths may also be helpful. Stretching exercises are highly recommended. General Instructions:  Be alert for signs of possible infection: redness, swelling, heat, red streaks, elevated temperature, and/or fever. These typically appear 4 to 6 days after the procedure. Immediately notify your doctor if you experience unusual bleeding, difficulty breathing, or loss of bowel or bladder control. If you experience increased pain, do not increase your pain medicine intake, unless instructed by your pain physician. Post-Procedure Care:  Be careful in moving about. Muscle spasms in the area of the injection may occur. Applying ice or heat to the area is often helpful. The incidence of spinal headaches after epidural injections ranges between 1.4% and 6%. If you develop a headache that does not seem to respond to conservative therapy, please let your physician know. This can be treated with an epidural blood patch.   Post-procedure numbness or redness is to be expected, however it should average 4 to 6 hours. If numbness and weakness of your extremities begins to develop 4 to 6 hours after your procedure, and is felt to be progressing and worsening, immediately contact your physician.   Diet:  If you experience nausea, do not eat until this sensation goes away. If you had a "Stellate Ganglion Block" for upper extremity "Reflex Sympathetic Dystrophy", do not eat or drink until your  hoarseness goes away. In any case, always start with liquids first and if you tolerate them well, then slowly progress to more solid foods. Activity:  For the first 4 to 6 hours after the procedure, use caution in moving about as you may experience numbness and/or weakness. Use caution in  cooking, using household electrical appliances, and climbing steps. If you need to reach your Doctor call our office: [phone] Monday-Thursday 8:00 am - 4:00 PM    Fridays: Closed     In case of an emergency: In case of emergency, call 911 or go to the nearest emergency room and have the physician there call us .  Interpretation of Procedure Every nerve block has two components: a diagnostic component, and a treatment component. Unrealistic expectations are the most common causes of "perceived failure".  In a perfect world, a single nerve block should be able to completely and permanently eliminate the pain. Sadly, the world is not perfect.  Most pain management nerve blocks are performed using local anesthetics and steroids. Steroids are responsible for any long-term benefit that you may experience. Their purpose is to decrease any chronic swelling that may exist in the area. Steroids begin to work immediately after being injected. However, most patients will not experience any benefits until 5 to 10 days after the injection, when the swelling has come down to the point where they can tell a difference. Steroids will only help if there is swelling to be treated. As such, they can assist with the diagnosis. If effective, they suggest an inflammatory component to the pain, and if ineffective, they rule out inflammation as the main cause or component of the problem. If the problem is one of mechanical compression, you will get no benefit from those steroids.   In the case of local anesthetics, they have a crucial role in the diagnosis of your condition. Most will begin to work within15 to 20 minutes after injection. The duration will depend on the type used (short- vs. Long-acting). It is of outmost importance that patients keep tract of their pain, after the procedure. To assist with this matter, a "Post-procedure Pain Diary" is provided. Make sure to complete it and to bring it back to your  follow-up appointment.  As long as the patient keeps accurate, detailed records of their symptoms after every procedure, and returns to have those interpreted, every procedure will provide us  with invaluable information. Even a block that does not provide the patient with any relief, will always provide us  with information about the mechanism and the origin of the pain. The only time a nerve block can be considered a waste of time is when patients do not keep track of the results, or do not keep their post-procedure appointment.  Reporting the results back to your physician The Pain Score  Pain is a subjective complaint. It cannot be seen, touched, or measured. We depend entirely on the patient's report of the pain in order to assess your condition and treatment. To evaluate the pain, we use a pain scale, where "0" means "No Pain", and a "10" is "the worst possible pain that you can even imagine" (i.e. something like been eaten alive by a shark or being torn apart by a lion).   You will frequently be asked to rate your pain. Please be as accurate, remember that medical decisions will be based on your responses. Please do not rate your pain above a 10. Doing so is actually interpreted as "symptom magnification" (exaggeration), as  well as lack of understanding with regards to the scale. To put this into perspective, when you tell us  that your pain is at a 10 (ten), what you are saying is that there is nothing we can do to make this pain any worse. (Carefully think about that.)  ______________________________________________________________________    Post-Radiofrequency (RF) Discharge Instructions  You have just completed a Radiofrequency Neurotomy.  The following instructions will provide you with information and guidelines for self-care upon discharge.  If at any time you have questions or concerns please call your physician. DO NOT DRIVE YOURSELF!!  Instructions: Apply ice: Fill a plastic sandwich  bag with crushed ice. Cover it with a small towel and apply to injection site. Apply for 15 minutes then remove x 15 minutes. Repeat sequence on day of procedure, until you go to bed. The purpose is to minimize swelling and discomfort after procedure. Apply heat: Apply heat to procedure site starting the day following the procedure. The purpose is to treat any soreness and discomfort from the procedure. Food intake: No eating limitations, unless stipulated above.  Nevertheless, if you have had sedation, you may experience some nausea.  In this case, it may be wise to wait at least two hours prior to resuming regular diet. Physical activities: Keep activities to a minimum for the first 8 hours after the procedure. For the first 24 hours after the procedure, do not drive a motor vehicle,  Operate heavy machinery, power tools, or handle any weapons.  Consider walking with the use of an assistive device or accompanied by an adult for the first 24 hours.  Do not drink alcoholic beverages including beer.  Do not make any important decisions or sign any legal documents. Go home and rest today.  Resume activities tomorrow, as tolerated.  Use caution in moving about as you may experience mild leg weakness.  Use caution in cooking, use of household electrical appliances and climbing steps. Driving: If you have received any sedation, you are not allowed to drive for 24 hours after your procedure. Blood thinner: Restart your blood thinner 6 hours after your procedure. (Only for those taking blood thinners) Insulin: As soon as you can eat, you may resume your normal dosing schedule. (Only for those taking insulin) Medications: May resume pre-procedure medications.  Do not take any drugs, other than what has been prescribed to you. Infection prevention: Keep procedure site clean and dry. Post-procedure Pain Diary: Extremely important that this be done correctly and accurately. Recorded information will be used to  determine the next step in treatment. Pain evaluated is that of treated area only. Do not include pain from an untreated area. Complete every hour, on the hour, for the initial 8 hours. Set an alarm to help you do this part accurately. Do not go to sleep and have it completed later. It will not be accurate. Follow-up appointment: Keep your follow-up appointment after the procedure. Usually 2-6 weeks after radiofrequency. Bring you pain diary. The information collected will be essential for your long-term care.   Expect: From numbing medicine (AKA: Local Anesthetics): Numbness or decrease in pain. Onset: Full effect within 15 minutes of injected. Duration: It will depend on the type of local anesthetic used. On the average, 1 to 8 hours.  From steroids (when added): Decrease in swelling or inflammation. Once inflammation is improved, relief of the pain will follow. Onset of benefits: Depends on the amount of swelling present. The more swelling, the longer it will take for the  benefits to be seen. In some cases, up to 10 days. Duration: Steroids will stay in the system x 2 weeks. Duration of benefits will depend on multiple posibilities including persistent irritating factors. From procedure: Some discomfort is to be expected once the numbing medicine wears off. In the case of radiofrequency procedures, this may last as long as 6-8 weeks. Additional post-procedure pain medication is provided for this. Discomfort is minimized if ice and heat are applied as instructed.  Call if: You experience numbness and weakness that gets worse with time, as opposed to wearing off. He experience any unusual bleeding, difficulty breathing, or loss of the ability to control your bowel and bladder. (This applies to Spinal procedures only) You experience any redness, swelling, heat, red streaks, elevated temperature, fever, or any other signs of a possible infection.  Emergency Numbers: Durning business hours (Monday  - Thursday, 8:00 AM - 4:00 PM) (Friday, 9:00 AM - 12:00 Noon): [phone] After hours: [phone] ______________________________________________________________________

## 2024-04-01 NOTE — Progress Notes (Signed)
 Patient's Name: [name]  MRN: [id number]  Referring Provider: Fernande Ophelia JINNY DOUGLAS, MD  DOB: June 25, 1966  PCP: Fernande Ophelia JINNY DOUGLAS, MD  DOS: 04/01/2024  Note by: Wallie Sherry, MD  Service setting: Ambulatory outpatient  Specialty: Interventional Pain Management  Patient type: Established  Location: [facility] (AMB) Pain Management Facility  Visit type: Interventional Procedure   Primary Reason for Visit: Interventional Pain Management Treatment. CC: Back Pain (lower)   Procedure:          Anesthesia, Analgesia, Anxiolysis:  Type: Thermal Lumbar Facet, Medial Branch Radiofrequency Ablation/Neurotomy  #8 Primary Purpose: Therapeutic Region: Posterolateral Lumbosacral Spine Level:  L3, L4, L5, Medial Branch Level(s). These levels will denervate the L3-4, L4-5,lumbar facet joints. Laterality: Bilateral  Type: Local Anesthesia  Local Anesthetic: Lidocaine  1-2% Anxiolysis: IV Versed  2 mg Position: Prone   Indications: 1. Lumbar facet arthropathy   2. Lumbar spondylosis   3. Chronic pain syndrome     [name] has been dealing with the above chronic pain for longer than three months and has either failed to respond, was unable to tolerate, or simply did not get enough benefit from other more conservative therapies including, but not limited to: 1. Over-the-counter medications 2. Anti-inflammatory medications 3. Muscle relaxants 4. Membrane stabilizers 5. Opioids 6. Physical therapy and/or chiropractic manipulation 7. Modalities (Heat, ice, etc.) 8. Invasive techniques such as nerve blocks. [name] has attained more than 50% relief of the pain from a series of diagnostic injections conducted in separate occasions.  Pain Score: Pre-procedure: 6 /10 Post-procedure: 0-No pain/10  Stopped Xarelto  3 days ago  Pre-op Assessment:  [name] is a 58 y.o. (year old), male patient, seen today for interventional treatment. He  has a past surgical history that includes Cardioversion  (1989); Cardiac catheterization (03/2010); Total knee arthroplasty (Left); Prostate surgery (06/26/2023); and tooth implant. [name] has a current medication list which includes the following prescription(s): allopurinol , b complex vitamins, co q-10, cyclobenzaprine , docusate sodium, empagliflozin, fenofibrate, fluorouracil , fluoxetine  hcl, hydrocortisone , ibuprofen, icosapent  ethyl, levothyroxine  sodium, melatonin, metoprolol  succinate, mirtazapine, multivitamin, mupirocin  ointment, niacinamide  er, heliocare, probiotic product, rivaroxaban , rosuvastatin , entresto , spironolactone , theratears, mounjaro, and turmeric, and the following Facility-Administered Medications: lactated ringers . His primarily concern today is the Back Pain (lower)   Initial Vital Signs:  Pulse/HCG Rate: 68ECG Heart Rate: 72 Temp: (!) 97.2 F (36.2 C) Resp: 16 BP: (!) 112/57 SpO2: 98 %  BMI: Estimated body mass index is 35.87 kg/m as calculated from the following:   Height as of this encounter: 5' 10 (1.778 m).   Weight as of this encounter: 250 lb (113.4 kg).  Risk Assessment: Allergies: Reviewed. He has no known allergies.  Allergy Precautions: None required Coagulopathies: Reviewed. None identified.  Blood-thinner therapy: None at this time Active Infection(s): Reviewed. None identified. [name] is afebrile  Site Confirmation: [name] was asked to confirm the procedure and laterality before marking the site Procedure checklist: Completed Consent: Before the procedure and under the influence of no sedative(s), amnesic(s), or anxiolytics, the patient was informed of the treatment options, risks and possible complications. To fulfill our ethical and legal obligations, as recommended by the American Medical Association's Code of Ethics, I have informed the patient of my clinical impression; the nature and purpose of the treatment or procedure; the risks, benefits, and possible complications of the  intervention; the alternatives, including doing nothing; the risk(s) and benefit(s) of the alternative treatment(s) or procedure(s); and the risk(s) and benefit(s) of doing nothing. The patient  was provided information about the general risks and possible complications associated with the procedure. These may include, but are not limited to: failure to achieve desired goals, infection, bleeding, organ or nerve damage, allergic reactions, paralysis, and death. In addition, the patient was informed of those risks and complications associated to Spine-related procedures, such as failure to decrease pain; infection (i.e.: Meningitis, epidural or intraspinal abscess); bleeding (i.e.: epidural hematoma, subarachnoid hemorrhage, or any other type of intraspinal or peri-dural bleeding); organ or nerve damage (i.e.: Any type of peripheral nerve, nerve root, or spinal cord injury) with subsequent damage to sensory, motor, and/or autonomic systems, resulting in permanent pain, numbness, and/or weakness of one or several areas of the body; allergic reactions; (i.e.: anaphylactic reaction); and/or death. Furthermore, the patient was informed of those risks and complications associated with the medications. These include, but are not limited to: allergic reactions (i.e.: anaphylactic or anaphylactoid reaction(s)); adrenal axis suppression; blood sugar elevation that in diabetics may result in ketoacidosis or comma; water retention that in patients with history of congestive heart failure may result in shortness of breath, pulmonary edema, and decompensation with resultant heart failure; weight gain; swelling or edema; medication-induced neural toxicity; particulate matter embolism and blood vessel occlusion with resultant organ, and/or nervous system infarction; and/or aseptic necrosis of one or more joints. Finally, the patient was informed that Medicine is not an exact science; therefore, there is also the possibility of  unforeseen or unpredictable risks and/or possible complications that may result in a catastrophic outcome. The patient indicated having understood very clearly. We have given the patient no guarantees and we have made no promises. Enough time was given to the patient to ask questions, all of which were answered to the patient's satisfaction. Mr. Loden has indicated that he wanted to continue with the procedure. Attestation: I, the ordering provider, attest that I have discussed with the patient the benefits, risks, side-effects, alternatives, likelihood of achieving goals, and potential problems during recovery for the procedure that I have provided informed consent. Date  Time:   Pre-Procedure Preparation:  Monitoring: As per clinic protocol. Respiration, ETCO2, SpO2, BP, heart rate and rhythm monitor placed and checked for adequate function Safety Precautions: Patient was assessed for positional comfort and pressure points before starting the procedure. Time-out: I initiated and conducted the Time-out before starting the procedure, as per protocol. The patient was asked to participate by confirming the accuracy of the Time Out information. Verification of the correct person, site, and procedure were performed and confirmed by me, the nursing staff, and the patient. Time-out conducted as per Joint Commission's Universal Protocol ([email or web address].01.01.01). Time: 0913  Description of Procedure:          Laterality: Right & LEFT (Bilateral) Levels: L3, L4, L5,Medial Branch Level(s), at the L3-4, L4-5,  lumbar facet joints. Area Prepped: Lumbosacral Prepping solution: ChloraPrep (2% chlorhexidine gluconate and 70% isopropyl alcohol) Safety Precautions: Aspiration looking for blood return was conducted prior to all injections. At no point did we inject any substances, as a needle was being advanced. Before injecting, the patient was told to immediately notify me if he was experiencing any new onset of  ringing in the ears, or metallic taste in the mouth. No attempts were made at seeking any paresthesias. Safe injection practices and needle disposal techniques used. Medications properly checked for expiration dates. SDV (single dose vial) medications used. After the completion of the procedure, all disposable equipment used was discarded in the proper designated medical waste containers. Local Anesthesia:  Protocol guidelines were followed. The patient was positioned over the fluoroscopy table. The area was prepped in the usual manner. The time-out was completed. The target area was identified using fluoroscopy. A 12-in long, straight, sterile hemostat was used with fluoroscopic guidance to locate the targets for each level blocked. Once located, the skin was marked with an approved surgical skin marker. Once all sites were marked, the skin (epidermis, dermis, and hypodermis), as well as deeper tissues (fat, connective tissue and muscle) were infiltrated with a small amount of a short-acting local anesthetic, loaded on a 10cc syringe with a 25G, 1.5-in  Needle. An appropriate amount of time was allowed for local anesthetics to take effect before proceeding to the next step. Local Anesthetic: Lidocaine  2.0% The unused portion of the local anesthetic was discarded in the proper designated containers. Technical explanation of process:  Radiofrequency Ablation (RFA)  L3 Medial Branch Nerve RFA: The target area for the L3 medial branch is at the junction of the postero-lateral aspect of the superior articular process and the superior, posterior, and medial edge of the transverse process of L4. Under fluoroscopic guidance, a Radiofrequency needle was inserted until contact was made with os over the superior postero-lateral aspect of the pedicular shadow (target area). Sensory and motor testing was conducted to properly adjust the position of the needle. Once satisfactory placement of the needle was achieved, the  numbing solution was slowly injected after negative aspiration for blood. 2mL of the nerve block solution was injected without difficulty or complication. After waiting for at least 3 minutes, the ablation was performed. Once completed, the needle was removed intact. L4 Medial Branch Nerve RFA: The target area for the L4 medial branch is at the junction of the postero-lateral aspect of the superior articular process and the superior, posterior, and medial edge of the transverse process of L5. Under fluoroscopic guidance, a Radiofrequency needle was inserted until contact was made with os over the superior postero-lateral aspect of the pedicular shadow (target area). Sensory and motor testing was conducted to properly adjust the position of the needle. Once satisfactory placement of the needle was achieved, the numbing solution was slowly injected after negative aspiration for blood. 2mL of the nerve block solution was injected without difficulty or complication. After waiting for at least 3 minutes, the ablation was performed. Once completed, the needle was removed intact. L5 Medial Branch Nerve RFA: The target area for the L5 medial branch is at the junction of the postero-lateral aspect of the superior articular process of S1 and the superior, posterior, and medial edge of the sacral ala. Under fluoroscopic guidance, a Radiofrequency needle was inserted until contact was made with os over the superior postero-lateral aspect of the pedicular shadow (target area). Sensory and motor testing was conducted to properly adjust the position of the needle. Once satisfactory placement of the needle was achieved, the numbing solution was slowly injected after negative aspiration for blood.2 mL of the nerve block solution was injected without difficulty or complication. After waiting for at least 3 minutes, the ablation was performed. Once completed, the needle was removed intact.   Radiofrequency lesioning (ablation):   Radiofrequency Generator: [employer]onic Sensory Stimulation Parameters: 50 Hz was used to locate & identify the nerve, making sure that the needle was positioned such that there was no sensory stimulation below 0.3 V or above 0.7 V. Motor Stimulation Parameters: 2 Hz was used to evaluate the motor component. Care was taken not to lesion any nerves that demonstrated motor  stimulation of the lower extremities at an output of less than 2.5 times that of the sensory threshold, or a maximum of 2.0 V. Lesioning Technique Parameters: Standard Radiofrequency settings. (Not bipolar or pulsed.) Temperature Settings: 80 degrees C Lesioning time: 60 seconds Intra-operative Compliance: Compliant Materials & Medications: Needle(s) (Electrode/Cannula) Type: Teflon-coated, curved tip, Radiofrequency needle(s) Gauge: 22G Length: 10cm Numbing solution: 12 cc solution made of 10cc of 0.2% ropivacaine , 2 cc of Decadron  10 mg/cc.  2 cc injected at each level above on the right& left prior to ablation.  The unused portion of the solution was discarded in the proper designated containers.  Once the entire procedure was completed, the treated area was cleaned, making sure to leave some of the prepping solution back to take advantage of its long term bactericidal properties.  Illustration of the posterior view of the lumbar spine and the posterior neural structures. Laminae of L2 through S1 are labeled. DPRL5, dorsal primary ramus of L5; DPRS1, dorsal primary ramus of S1; DPR3, dorsal primary ramus of L3; FJ, facet (zygapophyseal) joint L3-L4; I, inferior articular process of L4; LB1, lateral branch of dorsal primary ramus of L1; IAB, inferior articular branches from L3 medial branch (supplies L4-L5 facet joint); IBP, intermediate branch plexus; MB3, medial branch of dorsal primary ramus of L3; NR3, third lumbar nerve root; S, superior articular process of L5; SAB, superior articular branches from L4 (supplies L4-5 facet joint  also); TP3, transverse process of L3.  Vitals:   04/01/24 0930 04/01/24 0933 04/01/24 0940 04/01/24 0942  BP: 120/77 118/79 98/66 103/61  Pulse:      Resp: 16 16 16    Temp:      SpO2: 99% 98% 94%   Weight:      Height:        Start Time: 0913 hrs. End Time: 0932 hrs.  Imaging Guidance (Spinal):          Type of Imaging Technique: Fluoroscopy Guidance (Spinal) Indication(s): Assistance in needle guidance and placement for procedures requiring needle placement in or near specific anatomical locations not easily accessible without such assistance. Exposure Time: Please see nurses notes. Contrast: None used. Fluoroscopic Guidance: I was personally present during the use of fluoroscopy. Tunnel Vision Technique used to obtain the best possible view of the target area. Parallax error corrected before commencing the procedure. Direction-depth-direction technique used to introduce the needle under continuous pulsed fluoroscopy. Once target was reached, antero-posterior, oblique, and lateral fluoroscopic projection used confirm needle placement in all planes. Images permanently stored in EMR. Interpretation: No contrast injected. I personally interpreted the imaging intraoperatively. Adequate needle placement confirmed in multiple planes. Permanent images saved into the patient's record.   Post-operative Assessment:  Post-procedure Vital Signs:  Pulse/HCG Rate: 6861 Temp: (!) 97.2 F (36.2 C) Resp: 16 BP: 103/61 SpO2: 94 %  EBL: None  Complications: No immediate post-treatment complications observed by team, or reported by patient.  Note: Patient had a slight vasovagal reaction towards the end with decreased BP and HP, given glycopyrolate 0.2 mg, sx improved. Otherwise,  The patient tolerated the entire procedure well. A repeat set of vitals were taken after the procedure and the patient was kept under observation following institutional policy, for this type of procedure.  Post-procedural neurological assessment was performed, showing return to baseline, prior to discharge. The patient was provided with post-procedure discharge instructions, including a section on how to identify potential problems. Should any problems arise concerning this procedure, the patient was given instructions to immediately contact  us , at any time, without hesitation. In any case, we plan to contact the patient by telephone for a follow-up status report regarding this interventional procedure.  Comments:  No additional relevant information.  Plan of Care  Orders:  Orders Placed This Encounter  Procedures   DG PAIN CLINIC C-ARM 1-60 MIN NO REPORT    Intraoperative interpretation by procedural physician at [facility].    Standing Status:   Standing    Number of Occurrences:   1    Reason for exam::   Assistance in needle guidance and placement for procedures requiring needle placement in or near specific anatomical locations not easily accessible without such assistance.    Patient instructed to restart Xarelto  tomorrow so long as he is not having any LE weakness.    Medications ordered for procedure: Meds ordered this encounter  Medications   lidocaine  (XYLOCAINE ) 2 % (with pres) injection 400 mg   lactated ringers  infusion   midazolam  (VERSED ) injection 0.5-2 mg    Make sure Flumazenil is available in the pyxis when using this medication. If oversedation occurs, administer 0.2 mg IV over 15 sec. If after 45 sec no response, administer 0.2 mg again over 1 min; may repeat at 1 min intervals; not to exceed 4 doses (1 mg)   ropivacaine  (PF) 2 mg/mL (0.2%) (NAROPIN ) injection 18 mL   dexamethasone  (DECADRON ) injection 20 mg    Medications administered: We administered lidocaine , lactated ringers , midazolam , ropivacaine  (PF) 2 mg/mL (0.2%), and dexamethasone .  See the medical record for exact dosing, route, and time of administration.  Disposition: Discharge home   Discharge Date & Time: 04/01/2024; 0942 hrs.   Follow-up plan:   Return in about 2 months (around 06/09/2024) for PPE, F2F (post RFA).     Future Appointments  Date Time Provider Department Center  06/09/2024  9:40 AM Marcelino Nurse, MD [facility] None  09/21/2024  1:30 PM Claudene Lehmann, MD [facility] None   Primary Care Physician: Fernande Ophelia JINNY DOUGLAS, MD Location: [facility] Note by: Nurse Marcelino, MD Date: 04/01/2024; Time: 9:46 AM  Disclaimer:  Medicine is not an exact science. The only guarantee in medicine is that nothing is guaranteed. It is important to note that the decision to proceed with this intervention was based on the information collected from the patient. The Data and conclusions were drawn from the patient's questionnaire, the interview, and the physical examination. Because the information was provided in large part by the patient, it cannot be guaranteed that it has not been purposely or unconsciously manipulated. Every effort has been made to obtain as much relevant data as possible for this evaluation. It is important to note that the conclusions that lead to this procedure are derived in large part from the available data. Always take into account that the treatment will also be dependent on availability of resources and existing treatment guidelines, considered by other Pain Management Practitioners as being common knowledge and practice, at the time of the intervention. For Medico-Legal purposes, it is also important to point out that variation in procedural techniques and pharmacological choices are the acceptable norm. The indications, contraindications, technique, and results of the above procedure should only be interpreted and judged by a Board-Certified Interventional Pain Specialist with extensive familiarity and expertise in the same exact procedure and technique.

## 2024-04-01 NOTE — Progress Notes (Signed)
 Safety precautions to be maintained throughout the outpatient stay will include: orient to surroundings, keep bed in low position, maintain call bell within reach at all times, provide assistance with transfer out of bed and ambulation.

## 2024-04-02 ENCOUNTER — Telehealth: Payer: Self-pay | Admitting: *Deleted

## 2024-04-02 NOTE — Telephone Encounter (Signed)
 No problems post procedure.

## 2024-04-17 ENCOUNTER — Encounter: Payer: Self-pay | Admitting: Internal Medicine

## 2024-06-09 ENCOUNTER — Ambulatory Visit
Attending: Student in an Organized Health Care Education/Training Program | Admitting: Student in an Organized Health Care Education/Training Program

## 2024-06-09 ENCOUNTER — Encounter: Payer: Self-pay | Admitting: Student in an Organized Health Care Education/Training Program

## 2024-06-09 VITALS — BP 119/71 | HR 71 | Temp 97.0°F | Resp 16 | Ht 71.0 in | Wt 255.0 lb

## 2024-06-09 DIAGNOSIS — M47816 Spondylosis without myelopathy or radiculopathy, lumbar region: Secondary | ICD-10-CM | POA: Diagnosis present

## 2024-06-09 NOTE — Progress Notes (Signed)
 PROVIDER NOTE: Interpretation of information contained herein should be left to medically-trained personnel. Specific patient instructions are provided elsewhere under Patient Instructions section of medical record. This document was created in part using AI and STT-dictation technology, any transcriptional errors that may result from this process are unintentional.  Patient: [name]  Service: E/M   PCP: [name] Ophelia JINNY DOUGLAS, MD  DOB: 05-16-1966  DOS: 06/09/2024  Provider: Wallie Sherry, MD  MRN: [id number]  Delivery: Face-to-face  Specialty: Interventional Pain Management  Type: Established Patient  Setting: Ambulatory outpatient facility  Specialty designation: 09  Referring Prov.: [name] Ophelia JINNY DOUGLAS, MD  Location: Outpatient office facility       History of present illness (HPI) [name], a 58 y.o. year old male, is here today because of his Lumbar spondylosis [M47.816]. [name] primary complain today is Back Pain  Pertinent problems: [name] does not have any pertinent problems on file.  Pain Assessment: Severity of Chronic pain is reported as a 4 /10. Location: Back Lower/denies. Onset: More than a month ago. Quality: Dull. Timing: Constant. Modifying factor(s): RFA. Vitals:  height is 5' 11 (1.803 m) and weight is 255 lb (115.7 kg). His temperature is 97 F (36.1 C) (abnormal). His blood pressure is 119/71 and his pulse is 71. His respiration is 16 and oxygen saturation is 97%.  BMI: Estimated body mass index is 35.57 kg/m as calculated from the following:   Height as of this encounter: 5' 11 (1.803 m).   Weight as of this encounter: 255 lb (115.7 kg).  Last encounter: 03/12/2024. Last procedure: 04/01/2024.  Reason for encounter:  History of Present Illness   [name] is a 58 year old male with chronic back pain who presents for follow-up regarding pain management options.  He engages in regular physical activity, including using a leverage  gym for exercises such as hack squats and deadlifts. These activities have been beneficial, although he experienced significant back discomfort after shoveling gravel a couple of weekends ago.  He has previously undergone ablations for his chronic back pain, which have provided diminishing relief over time. He is exploring the possibility of peripheral nerve stimulation (PNS) as a new treatment option.  He has a history of heart issues and is on blood thinners, which he would need to pause for any procedures. He also mentions having undergone prostate surgery about a year ago, which has resulted in erectile dysfunction managed with [employer] injections, covered by the [place].         ROS  Constitutional: Denies any fever or chills Gastrointestinal: No reported hemesis, hematochezia, vomiting, or acute GI distress Musculoskeletal: + lumbar facet arthropathy Neurological: No reported episodes of acute onset apraxia, aphasia, dysarthria, agnosia, amnesia, paralysis, loss of coordination, or loss of consciousness  Medication Review  Allopurinol , Carboxymethylcellulose Sodium, Co Q-10, Docusate Sodium, FLUoxetine  HCl, Ibuprofen, Levothyroxine  Sodium, Melatonin, Niacinamide  ER, Polypodium Leucotomos, Probiotic Product, Turmeric, b complex vitamins, cyclobenzaprine , empagliflozin, fenofibrate, fluorouracil , hydrocortisone , icosapent  Ethyl, metoprolol  succinate, mirtazapine, multivitamin, mupirocin  ointment, rivaroxaban , rosuvastatin , sacubitril -valsartan , spironolactone , and tirzepatide  History Review  Allergy: [name] has no known allergies. Drug: [name]  reports no history of drug use. Alcohol:  reports current alcohol use of about 2.0 - 3.0 standard drinks of alcohol per week. Tobacco:  reports that he has never smoked. He has never used smokeless tobacco. Social: [name]  reports that he has never smoked. He has never used smokeless tobacco. He reports current alcohol use of  about 2.0 - 3.0  standard drinks of alcohol per week. He reports that he does not use drugs. 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 Surgical: [name]  has a past surgical history that includes Cardioversion (1989); Cardiac catheterization (03/2010); Total knee arthroplasty (Left); Prostate surgery (06/26/2023); and tooth implant. Family: family history includes Arthritis in his mother; COPD in his mother; Coronary artery disease in his paternal grandfather; Crohn's disease in his brother; Depression in his mother; Diabetes in his mother; Heart attack in his father; Lung cancer in his maternal grandfather, paternal uncle, and paternal uncle; Pancreatic cancer in his paternal uncle; Thyroid  cancer in his paternal aunt.  Laboratory Chemistry Profile   Renal Lab Results  Component Value Date   BUN 26 (H) 06/04/2023   CREATININE 1.24 06/04/2023   BCR 21 (H) 06/04/2023   GFR 77.83 06/26/2016   GFRAA >60 10/13/2019   GFRNONAA >60 10/13/2019    Hepatic Lab Results  Component Value Date   AST 34 05/09/2017   ALT 34 05/09/2017   ALBUMIN 4.5 05/09/2017   ALKPHOS 66 05/09/2017   HCVAB NEG 10/29/2008   AMYLASE 41 11/06/2008   LIPASE 19 11/06/2008    Electrolytes Lab Results  Component Value Date   NA 139 06/04/2023   K 4.4 06/04/2023   CL 98 06/04/2023   CALCIUM  10.0 06/04/2023   MG 2.1 04/16/2013   PHOS 2.5 06/13/2010    Bone No results found for: VD25OH, VD125OH2TOT, CI6874NY7, CI7874NY7, 25OHVITD1, 25OHVITD2,  25OHVITD3, TESTOFREE, TESTOSTERONE  Inflammation (CRP: Acute Phase) (ESR: Chronic Phase) Lab Results  Component Value Date   CRP 2.7 07/27/2014   ESRSEDRATE 9 07/27/2014         Note: Above Lab results reviewed.  Recent Imaging Review  DG PAIN CLINIC C-ARM 1-60 MIN NO REPORT Fluoro was used, but no Radiologist interpretation will be provided.  Please refer to NOTES tab for provider progress note. Note: Reviewed        Physical Exam  Vitals: BP 119/71   Pulse 71   Temp (!) 97 F (36.1 C)   Resp 16   Ht 5' 11 (1.803 m)   Wt 255 lb (115.7 kg)   SpO2 97%   BMI 35.57 kg/m  BMI: Estimated body mass index is 35.57 kg/m as calculated from the following:   Height as of this encounter: 5' 11 (1.803 m).   Weight as of this encounter: 255 lb (115.7 kg). Ideal: Ideal body weight: 75.3 kg (166 lb 0.1 oz) Adjusted ideal body weight: 91.4 kg (201 lb 9.7 oz) General appearance: Well nourished, well developed, and well hydrated. In no apparent acute distress Mental status: Alert, oriented x 3 (person, place, & time)       Respiratory: No evidence of acute respiratory distress Eyes: PERLA  +axial low back pain  Assessment   Diagnosis  1. Lumbar spondylosis   2. Lumbar facet arthropathy      Updated Problems: No problems updated.  Plan of Care   I explained to pt how Sprint peripheral nerve stimulation (PNS)  is typically considered for patients with chronic, localized pain that is not responding to conservative treatments such as  medications, physical therapy, or injections.  The SPRINT peripheral nerve stimulator is designed for short-term, percutaneous use (approximately 60 days) to modulate pain through targeted nerve stimulation. Unlike traditional permanent implants, SPRINT is temporary but can lead to long-lasting pain relief by altering pain signals.  We discussed the risks and benefits of peripheral nerve stimulation. Benefits: minimally invasive, does not require  permanent implantation, can offer significant pain relief, improving function and quality of life, may reduce the need for long-term opioid use. The risks/challenges include (but not limited to):  infection or irritation at the stimulation site, discomfort from electrode placement, risk of incomplete pain relief or temporary relief post-removal, limited to short-term therapy, which may be a disadvantage in chronic, refractory cases     [name] has a current medication list which includes the following long-term medication(s): fenofibrate, icosapent  ethyl, levothyroxine  sodium, metoprolol  succinate, mirtazapine, rivaroxaban , rosuvastatin , and spironolactone .  Pharmacotherapy (Medications Ordered): No orders of the defined types were placed in this encounter.  Orders:  Orders Placed This Encounter  Procedures   Implantable Peripheral Nerve Stimulator    Standing Status:   Future    Expiration Date:   09/09/2024    Scheduling Instructions:     Procedure: Temporary implantable peripheral nerve stimulator     Side:  Right L4     Nerve site: Medial branch nerve      Sedation: Patient's choice.     Timeframe: ASAA    Location of Procedure:   [facility]     Status post right L3, L4, L5 RFA on 10/20/2018, status post left L3, L4, L5 RFA on 02/23/2019.  Helpful in reducing his axial low back pain and improving his functional status.  Can repeat PRN after 6 months. L3/4 ESI 4/5/211 01/11/20 for low back + radiating hip pain, hip xray and SI joint xray largely unremarkable         Return in about 2 months (around 08/17/2024) for PNS Right L4, in clinic NS.    Recent Visits Date Type Provider Dept  04/01/24 Procedure visit Marcelino Nurse, MD [facility]  03/12/24 Office Visit Marcelino Nurse, MD [facility]  Showing recent visits within past 90 days and meeting all other requirements Today's Visits Date Type Provider Dept  06/09/24 Office Visit  Marcelino Nurse, MD [facility]  Showing today's visits and meeting all other requirements Future Appointments No visits were found meeting these conditions. Showing future appointments within next 90 days and meeting all other requirements  I discussed the assessment and treatment plan with the patient. The patient was provided an opportunity to ask questions and all were answered. The patient agreed with the plan and demonstrated an understanding of the instructions.  Patient advised to call back or seek an in-person evaluation if the symptoms or condition worsens.  I personally spent a total of 30 minutes in the care of the patient today including preparing to see the patient, getting/reviewing separately obtained history, performing a medically appropriate exam/evaluation, counseling and educating, placing orders, and documenting clinical information in the EHR.   Note by: Nurse Marcelino, MD (TTS and AI technology used. I apologize for any typographical errors that were not detected and corrected.) Date: 06/09/2024; Time: 10:14 AM

## 2024-06-09 NOTE — Patient Instructions (Signed)
 SPRINT PNS Brochure Please confirm [employer] insurance approval for PNS before scheduling

## 2024-06-09 NOTE — Progress Notes (Signed)
 Safety precautions to be maintained throughout the outpatient stay will include: orient to surroundings, keep bed in low position, maintain call bell within reach at all times, provide assistance with transfer out of bed and ambulation.

## 2024-06-19 ENCOUNTER — Telehealth (HOSPITAL_BASED_OUTPATIENT_CLINIC_OR_DEPARTMENT_OTHER): Payer: Self-pay

## 2024-06-19 NOTE — Telephone Encounter (Signed)
   Pre-operative Risk Assessment    Patient Name: [name]  DOB: 1965-08-19 MRN: [id number]   Date of last office visit: 11/06/2023 with Rosaline Bane, NP Date of next office visit: None  Request for Surgical Clearance    Procedure:  colonoscopy/upper endoscopy at [facility]  Date of Surgery:  Clearance 07/15/24                                 Surgeon:  Does not specify Surgeon's Group or Practice Name:  [facility]  Phone number:  [phone] Fax number:  [phone]   Type of Clearance Requested:   - Medical  - Pharmacy:  Hold Rivaroxaban  (Xarelto ) _needs to be d/c preoperatively for the indicated times in accordance with ASGE Guidelines. Please calculate patient's creatine clearance.   Type of Anesthesia:  Does not specify   Additional requests/questions:  If you determine that your pt needs bridging with enoxaparin while off of their coumadin  or other anticoagulant, please provide orders and instructions to the pt. Please instruct the pt to stop the enoxaparin 24 hours prior to procedure. [facility] IS NOT RESPONSIBLE FOR PROVIDING INSTRUCTIONS OR MEDICATION TO THE PATIENT. If pt undergoes a complex polypectomy, blood thinning medications will be held at least 5 days post procedure.   Signed, Patrcia Hong L   06/19/2024, 1:08 PM

## 2024-06-19 NOTE — Telephone Encounter (Signed)
 Pharmacy, can you please provide recommendations for holding Xarelto  for upcoming procedure?  Thank you!

## 2024-06-24 ENCOUNTER — Telehealth (HOSPITAL_BASED_OUTPATIENT_CLINIC_OR_DEPARTMENT_OTHER): Payer: Self-pay | Admitting: *Deleted

## 2024-06-24 NOTE — Telephone Encounter (Signed)
 Pt has been scheduled tele pre op appt 08/28/23. Med rec and consent are done.

## 2024-06-24 NOTE — Telephone Encounter (Signed)
 Patient with diagnosis of afib on Xarelto  for anticoagulation.    Procedure:  colonoscopy/upper endoscopy at [facility]   Date of Surgery:  Clearance 07/15/24   CHA2DS2-VASc Score = 2   This indicates a 2.2% annual risk of stroke. The patient's score is based upon: CHF History: 1 HTN History: 1 Diabetes History: 0 Stroke History: 0 Vascular Disease History: 0 Age Score: 0 Gender Score: 0   CrCl 81 ml/min Platelet count 244 K  Patient has not had an Afib/aflutter ablation in the last 3 months, DCCV within the last 4 weeks or a watchman implanted in the last 45 days   Per office protocol, patient can hold Xarelto  for 2 days prior to procedure.    Patient will not need bridging with Lovenox (enoxaparin) around procedure.  **This guidance is not considered finalized until pre-operative APP has relayed final recommendations.**

## 2024-06-24 NOTE — Telephone Encounter (Signed)
   Name: [name]  DOB: Nov 22, 1965  MRN: [id number]  Primary Cardiologist: Elspeth Sage, MD (Inactive)   Preoperative team, please contact this patient and set up a phone call appointment for further preoperative risk assessment. Please obtain consent and complete medication review. Thank you for your help.  I confirm that guidance regarding antiplatelet and oral anticoagulation therapy has been completed and, if necessary, noted below.  Per office protocol, patient can hold Xarelto  for 2 days prior to procedure.     Patient will not need bridging with Lovenox (enoxaparin) around procedure.  I also confirmed the patient resides in the state of [place] . As per [facility] Medical Board telemedicine laws, the patient must reside in the state in which the provider is licensed.   Lamarr Satterfield, NP 06/24/2024, 12:11 PM [facility]

## 2024-06-24 NOTE — Telephone Encounter (Signed)
 Pt has been scheduled tele preop appt 07/07/24. Med rec and consent are done.    Patient Consent for Virtual Visit        [name] has provided verbal consent on 06/24/2024 for a virtual visit (video or telephone).   CONSENT FOR VIRTUAL VISIT FOR:  [name]  By participating in this virtual visit I agree to the following:  I hereby voluntarily request, consent and authorize [facility] and its employed or contracted physicians, physician assistants, nurse practitioners or other licensed health care professionals (the Practitioner), to provide me with telemedicine health care services (the "Services) as deemed necessary by the treating Practitioner. I acknowledge and consent to receive the Services by the Practitioner via telemedicine. I understand that the telemedicine visit will involve communicating with the Practitioner through live audiovisual communication technology and the disclosure of certain medical information by electronic transmission. I acknowledge that I have been given the opportunity to request an in-person assessment or other available alternative prior to the telemedicine visit and am voluntarily participating in the telemedicine visit.  I understand that I have the right to withhold or withdraw my consent to the use of telemedicine in the course of my care at any time, without affecting my right to future care or treatment, and that the Practitioner or I may terminate the telemedicine visit at any time. I understand that I have the right to inspect all information obtained and/or recorded in the course of the telemedicine visit and may receive copies of available information for a reasonable fee.  I understand that some of the potential risks of receiving the Services via telemedicine include:  Delay or interruption in medical evaluation due to technological equipment failure or disruption; Information transmitted may not be sufficient (e.g. poor  resolution of images) to allow for appropriate medical decision making by the Practitioner; and/or  In rare instances, security protocols could fail, causing a breach of personal health information.  Furthermore, I acknowledge that it is my responsibility to provide information about my medical history, conditions and care that is complete and accurate to the best of my ability. I acknowledge that Practitioner's advice, recommendations, and/or decision may be based on factors not within their control, such as incomplete or inaccurate data provided by me or distortions of diagnostic images or specimens that may result from electronic transmissions. I understand that the practice of medicine is not an exact science and that Practitioner makes no warranties or guarantees regarding treatment outcomes. I acknowledge that a copy of this consent can be made available to me via my patient portal [facility] MyChart), or I can request a printed copy by calling the office of  [facility].    I understand that my insurance will be billed for this visit.   I have read or had this consent read to me. I understand the contents of this consent, which adequately explains the benefits and risks of the Services being provided via telemedicine.  I have been provided ample opportunity to ask questions regarding this consent and the Services and have had my questions answered to my satisfaction. I give my informed consent for the services to be provided through the use of telemedicine in my medical care

## 2024-07-01 ENCOUNTER — Encounter: Payer: Self-pay | Admitting: Internal Medicine

## 2024-07-07 ENCOUNTER — Ambulatory Visit: Attending: Cardiology | Admitting: Nurse Practitioner

## 2024-07-07 DIAGNOSIS — Z01810 Encounter for preprocedural cardiovascular examination: Secondary | ICD-10-CM

## 2024-07-07 NOTE — Progress Notes (Signed)
 Virtual Visit via Telephone Note   Because of [name] co-morbid illnesses, he is at least at moderate risk for complications without adequate follow up.  This format is felt to be most appropriate for this patient at this time.  Due to technical limitations with video connection (technology), today's appointment will be conducted as an audio only telehealth visit, and [name] verbally agreed to proceed in this manner.   All issues noted in this document were discussed and addressed.  No physical exam could be performed with this format.  Evaluation Performed:  Preoperative cardiovascular risk assessment _____________   Date:  07/07/2024   Patient ID:  [name], DOB 1966-03-23, MRN [id number] Patient Location:  Home Provider location:   Office  Primary Care Provider:  Fernande Ophelia JINNY DOUGLAS, MD Primary Cardiologist:  [name] Fernande, MD (Inactive)  Chief Complaint / Patient Profile   58 y.o. y/o male with a h/o nonobstructive CAD, paroxysmal atrial fibrillation, chronic HFrEF, NICM, hypertension, hyperlipidemia, OSA, and prostate cancer who is pending colonoscopy/upper endoscopy on 07/15/2024 with [facility] gastroenterology and presents today for telephonic preoperative cardiovascular risk assessment.  History of Present Illness    [name] is a 58 y.o. male who presents via audio/video conferencing for a telehealth visit today.  Pt was last seen in cardiology clinic on 11/06/2023 by Rosaline Bane, NP.  At that time [name] was doing well.  The patient is now pending procedure as outlined above. Since his last visit, he has done well from a cardiac standpoint.   He denies chest pain, palpitations, dyspnea, pnd, orthopnea, n, v, dizziness, syncope, edema, weight gain, or early satiety. All other systems reviewed and are otherwise negative except as noted above.   Past Medical History    Past Medical History:  Diagnosis Date    Anxiety    Arthritis    gout   Atrial fibrillation (HCC) 1989   Basal cell carcinoma 06/23/2020   Upper mid back. Nodular.   Basal cell carcinoma 12/28/2020   Right upper arm, superficial, [facility] 03/07/2021   Basal cell carcinoma 04/01/2023   Left upper lateral arm. Superficial and nodular. [facility] 06/03/23   Basal cell carcinoma 04/01/2023   Right upper back. Superficial and nodular. 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 Hypertrophic. [facility] 06/03/23   throat cancer    Past Surgical History:  Procedure Laterality Date   CARDIAC CATHETERIZATION  03/2010   No sig coronary disease, EF ~40%   CARDIOVERSION  1989   JOINT REPLACEMENT     PROSTATE SURGERY  06/26/2023   at [facility]   tooth implant     TOTAL KNEE ARTHROPLASTY Left     Allergies  No Known Allergies  Home Medications    Prior to Admission medications   Medication Sig Start Date End Date  Taking? Authorizing Provider  ALLOPURINOL  PO Take 400 mg by mouth daily. 09/19/20   [provider]  b complex vitamins capsule Take 1 capsule by mouth daily.    [provider]  Coenzyme Q10 (CO Q-10) 100 MG CAPS Take 1 capsule by mouth daily.    [provider]  cyclobenzaprine  (FLEXERIL ) 10 MG tablet Take by mouth daily. 08/24/20   [provider]  Docusate Sodium 100 MG capsule Take 100 mg by mouth daily. (Stool softener)    [provider]  empagliflozin (JARDIANCE) 25  MG TABS tablet Take 12.5 mg by mouth daily.    [provider]  fenofibrate 160 MG tablet Take 1 tablet by mouth daily. 01/11/21   [provider]  fluorouracil  (EFUDEX ) 5 % cream Apply topically 2 (two) times daily. To nose and temples twice daily for up to 7 days or until reaction. 03/23/24   Claudene Lehmann, MD  FLUOXETINE  HCL PO Take 80 mg by mouth daily.    [provider]  hydrocortisone  2.5 % cream Apply BID to aa, until clear. 09/23/23   Claudene Lehmann, MD  Ibuprofen 200 MG CAPS Take 800 mg by mouth 2 (two) times daily as needed (pain).    [provider]  icosapent  Ethyl (VASCEPA ) 1 g capsule Take 4 capsules (4 g total) by mouth 2 (two) times daily. 11/16/22   Mona Vinie BROCKS, MD  LEVOTHYROXINE  SODIUM PO Take 224 mcg by mouth daily. 05/23/20   [provider]  Melatonin 5 MG CAPS Take 20 mg by mouth at bedtime. Takes 2 capsules po at bedtime for sleep.    [provider]  metoprolol  succinate (TOPROL -XL) 50 MG 24 hr tablet Take 1 tablet (50 mg total) by mouth at bedtime. Take with or immediately following a meal. 11/06/23 06/24/24  Swinyer, Rosaline HERO, NP  mirtazapine (REMERON) 30 MG tablet Take 30 mg by mouth daily. 11/16/19   [provider]  Multiple Vitamin (MULTIVITAMIN) tablet Take 1 tablet by mouth daily.    [provider]  mupirocin  ointment (BACTROBAN ) 2 % Apply 1 application topically daily. 12/28/20   Moye, Virginia , MD  Niacinamide  500 MG TBCR Take 1 tablet by mouth in the morning and at bedtime. For skin cancer prevention. 12/28/20   Moye, Virginia , MD  Polypodium Leucotomos (HELIOCARE) 240 MG CAPS Take 240 mg by mouth every evening.    [provider]  Probiotic Product (CULTURELLE PROBIOTICS PO) Take 200 mg by mouth daily.    [provider]  rivaroxaban  (XARELTO ) 20 MG TABS tablet Take 1 tablet (20 mg total) by mouth daily. 12/18/17   Fernande [name] BROCKS, MD  rosuvastatin  (CRESTOR ) 40 MG  tablet Take 1 tablet (40 mg total) by mouth daily. 11/06/23 06/24/24  Swinyer, Rosaline HERO, NP  sacubitril -valsartan  (ENTRESTO ) 97-103 MG Take 1 tablet by mouth 2 (two) times daily. 06/04/23   Rolan Ezra RAMAN, MD  spironolactone  (ALDACTONE ) 25 MG tablet Take 1 tablet (25 mg total) by mouth daily. 05/10/23   Riddle, Suzann, NP  THERATEARS 0.25 % SOLN Place 1 drop into both eyes as needed (dry eyes). 01/19/21   [provider]  tirzepatide CLOYDE) 7.5 MG/0.5ML Pen Inject 7.5 mg into the skin once a week. 03/25/24   [provider]  Turmeric 500 MG TABS Take by mouth daily.    [provider]    Physical Exam    Vital Signs:  [name] does not have vital signs available for review  today.  Given telephonic nature of communication, physical exam is limited. AAOx3. NAD. Normal affect.  Speech and respirations are unlabored.  Accessory Clinical Findings    None  Assessment & Plan    1.  Preoperative Cardiovascular Risk Assessment:  According to the Revised Cardiac Risk Index (RCRI), his Perioperative Risk of Major Cardiac Event is (%): 0.9. His Functional Capacity in METs is: 7.01 according to the [facility] Activity Status Index (DASI).Therefore, based on ACC/AHA guidelines, patient would be at acceptable risk for the planned procedure without further cardiovascular testing.  The patient was advised that if he develops new symptoms prior to surgery to contact our office to arrange for a follow-up visit, and he verbalized understanding.  Per office protocol, patient can hold Xarelto  for 2 days prior to procedure.  Please resume Xarelto  as soon as possible postprocedure, at the discretion of the surgeon.   Patient will not need bridging with Lovenox (enoxaparin) around procedure.   A copy of this note will be routed to requesting surgeon.  Time:   Today, I have spent 5 minutes with the patient with telehealth technology discussing medical history, symptoms, and  management plan.     [name] JAYSON Braver, NP  07/07/2024, 2:28 PM

## 2024-07-15 ENCOUNTER — Ambulatory Visit: Admitting: Anesthesiology

## 2024-07-15 ENCOUNTER — Encounter: Admission: RE | Disposition: A | Payer: Self-pay | Source: Home / Self Care | Attending: Internal Medicine

## 2024-07-15 ENCOUNTER — Other Ambulatory Visit: Payer: Self-pay

## 2024-07-15 ENCOUNTER — Encounter: Payer: Self-pay | Admitting: Internal Medicine

## 2024-07-15 ENCOUNTER — Ambulatory Visit
Admission: RE | Admit: 2024-07-15 | Discharge: 2024-07-15 | Disposition: A | Discharge status: Home / Self Care | Attending: Internal Medicine | Admitting: Internal Medicine

## 2024-07-15 DIAGNOSIS — I509 Heart failure, unspecified: Secondary | ICD-10-CM | POA: Diagnosis not present

## 2024-07-15 DIAGNOSIS — R1313 Dysphagia, pharyngeal phase: Secondary | ICD-10-CM | POA: Diagnosis not present

## 2024-07-15 DIAGNOSIS — Z9079 Acquired absence of other genital organ(s): Secondary | ICD-10-CM | POA: Diagnosis not present

## 2024-07-15 DIAGNOSIS — G4730 Sleep apnea, unspecified: Secondary | ICD-10-CM | POA: Diagnosis not present

## 2024-07-15 DIAGNOSIS — Z1211 Encounter for screening for malignant neoplasm of colon: Secondary | ICD-10-CM | POA: Diagnosis present

## 2024-07-15 DIAGNOSIS — K2970 Gastritis, unspecified, without bleeding: Secondary | ICD-10-CM | POA: Diagnosis not present

## 2024-07-15 DIAGNOSIS — Z85828 Personal history of other malignant neoplasm of skin: Secondary | ICD-10-CM | POA: Diagnosis not present

## 2024-07-15 DIAGNOSIS — Z8546 Personal history of malignant neoplasm of prostate: Secondary | ICD-10-CM | POA: Diagnosis not present

## 2024-07-15 DIAGNOSIS — E039 Hypothyroidism, unspecified: Secondary | ICD-10-CM | POA: Diagnosis not present

## 2024-07-15 DIAGNOSIS — I110 Hypertensive heart disease with heart failure: Secondary | ICD-10-CM | POA: Diagnosis not present

## 2024-07-15 DIAGNOSIS — K219 Gastro-esophageal reflux disease without esophagitis: Secondary | ICD-10-CM | POA: Diagnosis not present

## 2024-07-15 HISTORY — PX: COLONOSCOPY: SHX5424

## 2024-07-15 HISTORY — PX: ESOPHAGOGASTRODUODENOSCOPY: SHX5428

## 2024-07-15 HISTORY — DX: Malignant neoplasm of prostate: C61

## 2024-07-15 HISTORY — PX: MALONEY DILATION: SHX5535

## 2024-07-15 SURGERY — COLONOSCOPY
Anesthesia: General

## 2024-07-15 MED ORDER — EPHEDRINE 5 MG/ML INJ
INTRAVENOUS | Status: AC
  Filled 2024-07-15: qty 10

## 2024-07-15 MED ORDER — GLYCOPYRROLATE 0.2 MG/ML IJ SOLN
INTRAMUSCULAR | Status: DC | PRN
  Administered 2024-07-15: .2 mg via INTRAVENOUS

## 2024-07-15 MED ORDER — DEXMEDETOMIDINE HCL IN NACL 80 MCG/20ML IV SOLN
INTRAVENOUS | Status: DC | PRN
  Administered 2024-07-15: 8 ug via INTRAVENOUS
  Administered 2024-07-15: 12 ug via INTRAVENOUS

## 2024-07-15 MED ORDER — GLYCOPYRROLATE 0.2 MG/ML IJ SOLN
INTRAMUSCULAR | Status: AC
  Filled 2024-07-15: qty 1

## 2024-07-15 MED ORDER — EPHEDRINE SULFATE-NACL 50-0.9 MG/10ML-% IV SOSY
PREFILLED_SYRINGE | INTRAVENOUS | Status: DC | PRN
  Administered 2024-07-15: 10 mg via INTRAVENOUS
  Administered 2024-07-15: 15 mg via INTRAVENOUS

## 2024-07-15 MED ORDER — SODIUM CHLORIDE 0.9 % IV SOLN: INTRAVENOUS | Status: DC

## 2024-07-15 MED ORDER — PHENYLEPHRINE 80 MCG/ML (10ML) SYRINGE FOR IV PUSH (FOR BLOOD PRESSURE SUPPORT)
PREFILLED_SYRINGE | INTRAVENOUS | Status: DC | PRN
  Administered 2024-07-15 (×4): 160 ug via INTRAVENOUS

## 2024-07-15 MED ORDER — PHENYLEPHRINE 80 MCG/ML (10ML) SYRINGE FOR IV PUSH (FOR BLOOD PRESSURE SUPPORT)
PREFILLED_SYRINGE | INTRAVENOUS | Status: AC
  Filled 2024-07-15: qty 10

## 2024-07-15 MED ORDER — LIDOCAINE HCL (CARDIAC) PF 100 MG/5ML IV SOSY
PREFILLED_SYRINGE | INTRAVENOUS | Status: DC | PRN
  Administered 2024-07-15: 80 mg via INTRAVENOUS

## 2024-07-15 MED ORDER — LIDOCAINE HCL (PF) 2 % IJ SOLN
INTRAMUSCULAR | Status: AC
  Filled 2024-07-15: qty 5

## 2024-07-15 MED ORDER — DEXMEDETOMIDINE HCL IN NACL 80 MCG/20ML IV SOLN
INTRAVENOUS | Status: AC
  Filled 2024-07-15: qty 20

## 2024-07-15 MED ORDER — PROPOFOL 500 MG/50ML IV EMUL
INTRAVENOUS | Status: DC | PRN
  Administered 2024-07-15: 75 ug/kg/min via INTRAVENOUS

## 2024-07-15 MED ORDER — PROPOFOL 10 MG/ML IV BOLUS
INTRAVENOUS | Status: DC | PRN
  Administered 2024-07-15 (×7): 50 mg via INTRAVENOUS

## 2024-07-15 MED ORDER — PROPOFOL 1000 MG/100ML IV EMUL
INTRAVENOUS | Status: AC
  Filled 2024-07-15: qty 100

## 2024-07-15 NOTE — Op Note (Signed)
 [facility] Gastroenterology Patient Name: [name] Procedure Date: 07/15/2024 7:15 AM MRN: [id number] Account #: [id number] Date of Birth: 1966/03/25 Admit Type: Outpatient Age: 58 Room: [facility] ENDO ROOM 2 Gender: Male Note Status: Finalized Instrument Name: Upper GI Scope [id number] Procedure:             Upper GI endoscopy Indications:           Dysphagia, Suspected gastro-esophageal reflux disease Providers:             Bryant Saye K. Aundria MD, MD Referring MD:          Ophelia Sage, MD (Referring MD) Medicines:             Propofol  per Anesthesia Complications:         No immediate complications. Estimated blood loss: None. Procedure:             Pre-Anesthesia Assessment:                        - The risks and benefits of the procedure and the                         sedation options and risks were discussed with the                         patient. All questions were answered and informed                         consent was obtained.                        - Patient identification and proposed procedure were                         verified prior to the procedure by the nurse. The                         procedure was verified in the procedure room.                        - ASA Grade Assessment: III - A patient with severe                         systemic disease.                        - After reviewing the risks and benefits, the patient                         was deemed in satisfactory condition to undergo the                         procedure.                        After obtaining informed consent, the endoscope was                         passed under direct vision. Throughout the procedure,  the patient's blood pressure, pulse, and oxygen                         saturations were monitored continuously. The Endoscope                         was introduced through the mouth, and advanced to the                         third part of  duodenum. The upper GI endoscopy was                         accomplished without difficulty. The patient tolerated                         the procedure well. Findings:      No endoscopic abnormality was evident in the esophagus to explain the       patient's complaint of dysphagia. It was decided, however, to proceed       with dilation in the distal esophagus. The scope was withdrawn. Dilation       was performed with a Maloney dilator with no resistance at 54 Fr. The       dilation site was examined following endoscope reinsertion and showed no       change. Estimated blood loss: none.      No other significant abnormalities were identified in a careful       examination of the esophagus.      Patchy mild inflammation characterized by congestion (edema) and       erosions was found in the gastric antrum. Estimated blood loss: none.      The cardia and gastric fundus were normal on retroflexion.      The examined duodenum was normal. Impression:            - No endoscopic esophageal abnormality to explain                         patient's dysphagia. Esophagus dilated. Dilated.                        - Gastritis.                        - Normal examined duodenum.                        - No specimens collected. Recommendation:        - Monitor results to esophageal dilation                        - Proceed with colonoscopy Procedure Code(s):     --- Professional ---                        [phone], Esophagogastroduodenoscopy, flexible,                         transoral; diagnostic, including collection of                         specimen(s) by brushing or washing, when performed                         (  separate procedure)                        43450, Dilation of esophagus, by unguided sound or                         bougie, single or multiple passes Diagnosis Code(s):     --- Professional ---                        K29.70, Gastritis, unspecified, without bleeding                         R13.10, Dysphagia, unspecified CPT copyright 2022 American Medical Association. All rights reserved. The codes documented in this report are preliminary and upon coder review may  be revised to meet current compliance requirements. Ladell MARLA Boss MD, MD 07/15/2024 8:42:04 AM This report has been signed electronically. Number of Addenda: 0 Note Initiated On: 07/15/2024 7:15 AM Estimated Blood Loss:  Estimated blood loss: none.      [facility]

## 2024-07-15 NOTE — Anesthesia Postprocedure Evaluation (Signed)
 Anesthesia Post Note  Patient: [name]  Procedure(s) Performed: COLONOSCOPY EGD (ESOPHAGOGASTRODUODENOSCOPY) DILATION, ESOPHAGUS, USING MALONEY DILATOR  Patient location during evaluation: Endoscopy Anesthesia Type: General Level of consciousness: awake and alert Pain management: pain level controlled Vital Signs Assessment: post-procedure vital signs reviewed and stable Respiratory status: spontaneous breathing, nonlabored ventilation and respiratory function stable Cardiovascular status: blood pressure returned to baseline and stable Postop Assessment: no apparent nausea or vomiting Anesthetic complications: no   No notable events documented.   Last Vitals:  Vitals:   07/15/24 0916 07/15/24 0933  BP: (!) 97/53 (!) 97/40  Pulse: 86   Resp: 18 18  Temp:    SpO2: 95% 96%    Last Pain:  Vitals:   07/15/24 0933  TempSrc:   PainSc: 0-No pain                 Camellia Merilee Louder

## 2024-07-15 NOTE — Anesthesia Preprocedure Evaluation (Addendum)
 Anesthesia Evaluation  Patient identified by MRN, date of birth, ID band Patient awake    Reviewed: Allergy & Precautions, H&P , NPO status , Patient's Chart, lab work & pertinent test results  Airway Mallampati: II  TM Distance: >3 FB Neck ROM: full    Dental no notable dental hx.    Pulmonary sleep apnea    Pulmonary exam normal        Cardiovascular hypertension, +CHF  + dysrhythmias (h/o cardioversion 1989) (-) pacemaker  ECHO 9/24:  1. Left ventricular ejection fraction, by estimation, is 35 to 40%. The  left ventricle has moderately decreased function. The left ventricle has  no regional wall motion abnormalities. The left ventricular internal  cavity size was mildly dilated. Left  ventricular diastolic parameters were normal.   2. Right ventricular systolic function is normal. The right ventricular  size is normal. There is normal pulmonary artery systolic pressure. The  estimated right ventricular systolic pressure is 31.6 mmHg.   3. Left atrial size was mildly dilated.   4. The mitral valve is normal in structure. No evidence of mitral valve  regurgitation. No evidence of mitral stenosis.   5. The aortic valve has an indeterminant number of cusps. Aortic valve  regurgitation is not visualized. Aortic valve sclerosis is present, with  no evidence of aortic valve stenosis.   6. The inferior vena cava is normal in size with greater than 50%  respiratory variability, suggesting right atrial pressure of 3 mmHg.     Neuro/Psych  PSYCHIATRIC DISORDERS  Depression    negative neurological ROS     GI/Hepatic Neg liver ROS,GERD  ,,  Endo/Other  Hypothyroidism    Renal/GU negative Renal ROS  negative genitourinary   Musculoskeletal  (+) Arthritis ,    Abdominal  (+) + obese  Peds  Hematology negative hematology ROS (+)   Anesthesia Other Findings throat cancer s/p chemo and radiation  Past Medical History: No  date: Anxiety No date: Arthritis     Comment:  gout 1989: Atrial fibrillation (HCC) 06/23/2020: Basal cell carcinoma     Comment:  Upper mid back. Nodular. 12/28/2020: Basal cell carcinoma     Comment:  Right upper arm, superficial, [facility] 03/07/2021 04/01/2023: Basal cell carcinoma     Comment:  Left upper lateral arm. Superficial and nodular. [facility]               06/03/23 04/01/2023: Basal cell carcinoma     Comment:  Right upper back. Superficial and nodular. 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 Hypertrophic. [facility] 06/03/23 No date: throat cancer  Past Surgical History: 03/2010: CARDIAC CATHETERIZATION     Comment:  No sig coronary disease, EF ~40% 1989: CARDIOVERSION No date: JOINT  REPLACEMENT 06/26/2023: PROSTATE SURGERY     Comment:  at [facility] No date: tooth implant No date: TOTAL KNEE ARTHROPLASTY; Left     Reproductive/Obstetrics negative OB ROS                              Anesthesia Physical Anesthesia Plan  ASA: 3  Anesthesia Plan: General   Post-op Pain Management: Minimal or no pain anticipated   Induction: Intravenous  PONV Risk Score and Plan: Propofol  infusion and TIVA  Airway Management Planned: Natural Airway  Additional Equipment:   Intra-op Plan:   Post-operative Plan:    Informed Consent: I have reviewed the patients History and Physical, chart, labs and discussed the procedure including the risks, benefits and alternatives for the proposed anesthesia with the patient or authorized representative who has indicated his/her understanding and acceptance.     Dental Advisory Given  Plan Discussed with: CRNA and Surgeon  Anesthesia Plan Comments:          Anesthesia Quick Evaluation

## 2024-07-15 NOTE — Interval H&P Note (Signed)
 History and Physical Interval Note:  07/15/2024 8:21 AM  [name]  has presented today for surgery, with the diagnosis of GERD, unspecified whether esophagitis present Colon Cancer Screening Pharyngeal Dysphagia.  The various methods of treatment have been discussed with the patient and family. After consideration of risks, benefits and other options for treatment, the patient has consented to  Procedure(s): COLONOSCOPY (N/A) EGD (ESOPHAGOGASTRODUODENOSCOPY) (N/A) as a surgical intervention.  The patient's history has been reviewed, patient examined, no change in status, stable for surgery.  I have reviewed the patient's chart and labs.  Questions were answered to the patient's satisfaction.     [place], Aymen Widrig

## 2024-07-15 NOTE — Op Note (Signed)
 [facility] Gastroenterology Patient Name: [name] Procedure Date: 07/15/2024 7:10 AM MRN: [id number] Account #: [id number] Date of Birth: 1966/03/27 Admit Type: Outpatient Age: 58 Room: [facility] ENDO ROOM 2 Gender: Male Note Status: Finalized Instrument Name: Colon Scope [id number] Procedure:             Colonoscopy Indications:           Screening for colorectal malignant neoplasm Providers:             Trashawn Oquendo K. Aundria MD, MD Referring MD:          Ophelia Sage, MD (Referring MD) Medicines:             Propofol  per Anesthesia Complications:         No immediate complications. Estimated blood loss: None. Procedure:             Pre-Anesthesia Assessment:                        - The risks and benefits of the procedure and the                         sedation options and risks were discussed with the                         patient. All questions were answered and informed                         consent was obtained.                        - Patient identification and proposed procedure were                         verified prior to the procedure by the nurse. The                         procedure was verified in the procedure room.                        - ASA Grade Assessment: III - A patient with severe                         systemic disease.                        - After reviewing the risks and benefits, the patient                         was deemed in satisfactory condition to undergo the                         procedure.                        After obtaining informed consent, the colonoscope was                         passed under direct vision. Throughout the procedure,  the patient's blood pressure, pulse, and oxygen                         saturations were monitored continuously. The                         Colonoscope was introduced through the anus and                         advanced to the the cecum, identified by  appendiceal                         orifice and ileocecal valve. The colonoscopy was                         somewhat difficult due to significant looping.                         Successful completion of the procedure was aided by                         applying abdominal pressure. The patient tolerated the                         procedure well. The quality of the bowel preparation                         was good. The ileocecal valve, appendiceal orifice,                         and rectum were photographed. Findings:      The perianal and digital rectal examinations were normal. Pertinent       negatives include normal sphincter tone and no palpable rectal lesions.      The entire examined colon appeared normal on direct and retroflexion       views. Impression:            - The entire examined colon is normal on direct and                         retroflexion views.                        - No specimens collected. Recommendation:        - Patient has a contact number available for                         emergencies. The signs and symptoms of potential                         delayed complications were discussed with the patient.                         Return to normal activities tomorrow. Written                         discharge instructions were provided to the patient.                        -  Monitor results to esophageal dilation                        - Resume previous diet.                        - Continue present medications.                        - Repeat colonoscopy in 10 years for screening                         purposes.                        - Return to my office in 3 months.                        - Telephone GI office to schedule appointment.                        - The findings and recommendations were discussed with                         the patient. Procedure Code(s):     --- Professional ---                        H9878, Colorectal cancer screening;  colonoscopy on                         individual not meeting criteria for high risk Diagnosis Code(s):     --- Professional ---                        Z12.11, Encounter for screening for malignant neoplasm                         of colon CPT copyright 2022 American Medical Association. All rights reserved. The codes documented in this report are preliminary and upon coder review may  be revised to meet current compliance requirements. Ladell MARLA Boss MD, MD 07/15/2024 9:01:58 AM This report has been signed electronically. Number of Addenda: 0 Note Initiated On: 07/15/2024 7:10 AM Scope Withdrawal Time: 0 hours 8 minutes 35 seconds  Total Procedure Duration: 0 hours 14 minutes 9 seconds  Estimated Blood Loss:  Estimated blood loss: none.      [facility]

## 2024-07-15 NOTE — H&P (Signed)
 Outpatient short stay form Pre-procedure 07/15/2024 8:20 AM [name], M.D.  Primary Physician: Ophelia Sage III, M.D.  Reason for visit:    Diagnoses  Gastroesophageal reflux disease, unspecified whether esophagitis present  Colon cancer screening  Pharyngeal dysphagia     History of present illness:  He had his last colonoscopy in 2018 at the [facility], which did not reveal any polyps. Review of the colonoscopy did reveal 2 small polyps measuring 2 mm in the rectum that were removed by Dr. Victory Brand. I do not have pathology results for those, however,. He experiences occasional rectal bleeding, which he attributes to hemorrhoids. No abdominal pain or significant changes in bowel habits.  He has been experiencing heartburn and reflux symptoms for the past three to four years, occurring about once a week. The sensation is described as a burning or pressure in the middle of his chest, sometimes alleviated with burping or taking Tums. He does not take regular medication for reflux.  He experiences difficulty swallowing and requires fluids to help food pass down his throat.  He has a history of prostate cancer, for which he underwent prostatectomy about a year ago and is currently cancer-free with a PSA level of 0.1. He also has a history of skin cancer, which he attributes to exposure during his [employer] service in [place].  His family history is notable for a brother with Crohn's disease. He served in the [employer] in [place] and was exposed to burn pits.  He takes Zepbound injections and Xarelto , a blood thinner.     Current Facility-Administered Medications:    0.9 %  sodium chloride  infusion, , Intravenous, Continuous, [place], Aleene Swanner K, MD, Last Rate: 20 mL/hr at 07/15/24 9190, Continued from Pre-op at 07/15/24 0809  Medications Prior to Admission  Medication Sig Dispense Refill Last Dose/Taking   ALLOPURINOL  PO Take 400 mg by mouth daily.   07/14/2024   b complex vitamins capsule Take  1 capsule by mouth daily.   07/14/2024   Coenzyme Q10 (CO Q-10) 100 MG CAPS Take 1 capsule by mouth daily.   07/14/2024   cyclobenzaprine  (FLEXERIL ) 10 MG tablet Take by mouth daily.   07/14/2024   empagliflozin (JARDIANCE) 25 MG TABS tablet Take 12.5 mg by mouth daily.   07/14/2024   fenofibrate 160 MG tablet Take 1 tablet by mouth daily.   07/14/2024   FLUOXETINE  HCL PO Take 80 mg by mouth daily.   07/14/2024   Ibuprofen 200 MG CAPS Take 800 mg by mouth 2 (two) times daily as needed (pain).   Past Month   LEVOTHYROXINE  SODIUM PO Take 224 mcg by mouth daily.   07/14/2024   metoprolol  succinate (TOPROL -XL) 50 MG 24 hr tablet Take 1 tablet (50 mg total) by mouth at bedtime. Take with or immediately following a meal. 90 tablet 3 07/14/2024   mirtazapine (REMERON) 30 MG tablet Take 30 mg by mouth daily.   07/14/2024   Multiple Vitamin (MULTIVITAMIN) tablet Take 1 tablet by mouth daily.   07/14/2024   Niacinamide  500 MG TBCR Take 1 tablet by mouth in the morning and at bedtime. For skin cancer prevention. 60 tablet 11 07/14/2024   Probiotic Product (CULTURELLE PROBIOTICS PO) Take 200 mg by mouth daily.   07/14/2024   rosuvastatin  (CRESTOR ) 40 MG tablet Take 1 tablet (40 mg total) by mouth daily.   07/14/2024   sacubitril -valsartan  (ENTRESTO ) 97-103 MG Take 1 tablet by mouth 2 (two) times daily. 60 tablet 6 07/14/2024   spironolactone  (ALDACTONE )  25 MG tablet Take 1 tablet (25 mg total) by mouth daily. 90 tablet 3 07/14/2024   Docusate Sodium 100 MG capsule Take 100 mg by mouth daily. (Stool softener)      fluorouracil  (EFUDEX ) 5 % cream Apply topically 2 (two) times daily. To nose and temples twice daily for up to 7 days or until reaction. 30 g 2    hydrocortisone  2.5 % cream Apply BID to aa, until clear. 28 g 3    icosapent  Ethyl (VASCEPA ) 1 g capsule Take 4 capsules (4 g total) by mouth 2 (two) times daily. 720 capsule 3    Melatonin 5 MG CAPS Take 20 mg by mouth at bedtime. Takes 2 capsules po at bedtime for  sleep.      mupirocin  ointment (BACTROBAN ) 2 % Apply 1 application topically daily. 30 g 0    Polypodium Leucotomos (HELIOCARE) 240 MG CAPS Take 240 mg by mouth every evening.      rivaroxaban  (XARELTO ) 20 MG TABS tablet Take 1 tablet (20 mg total) by mouth daily. 30 tablet 5 07/11/2024   THERATEARS 0.25 % SOLN Place 1 drop into both eyes as needed (dry eyes).      tirzepatide (MOUNJARO) 7.5 MG/0.5ML Pen Inject 7.5 mg into the skin once a week.   06/30/2024   Turmeric 500 MG TABS Take by mouth daily. (Patient not taking: Reported on 07/15/2024)   Not Taking     No Known Allergies   Past Medical History:  Diagnosis Date   Anxiety    Arthritis    gout   Atrial fibrillation (HCC) 1989   Basal cell carcinoma 06/23/2020   Upper mid back. Nodular.   Basal cell carcinoma 12/28/2020   Right upper arm, superficial, [facility] 03/07/2021   Basal cell carcinoma 04/01/2023   Left upper lateral arm. Superficial and nodular. [facility] 06/03/23   Basal cell carcinoma 04/01/2023   Right upper back. Superficial and nodular. 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 Hypertrophic. [facility] 06/03/23   throat cancer     Review of systems:  Otherwise negative.    Physical Exam  Gen: Alert, oriented. Appears stated age.  HEENT: [place]/AT. PERRLA. Lungs: CTA, no wheezes. CV: RR nl S1, S2. Abd: soft, benign, no masses. BS+ Ext: No edema. Pulses  2+    Planned procedures: Proceed with EGD and colonoscopy. The patient understands the nature of the planned procedure, indications, risks, alternatives and potential complications including but not limited to bleeding, infection, perforation, damage to internal organs and possible oversedation/side effects from anesthesia. The patient agrees and gives consent to proceed.  Please refer to procedure notes for findings, recommendations and patient disposition/instructions.     Elke Holtry K. [name], M.D. Gastroenterology 07/15/2024  8:20 AM

## 2024-07-15 NOTE — Transfer of Care (Signed)
 Immediate Anesthesia Transfer of Care Note  Patient: [name]  Procedure(s) Performed: COLONOSCOPY EGD (ESOPHAGOGASTRODUODENOSCOPY) DILATION, ESOPHAGUS, USING MALONEY DILATOR  Patient Location: PACU  Anesthesia Type:General  Level of Consciousness: sedated  Airway & Oxygen Therapy: Patient Spontanous Breathing  Post-op Assessment: Report given to RN and Post -op Vital signs reviewed and stable  Post vital signs: Reviewed and stable  Last Vitals:  Vitals Value Taken Time  BP 92/55 07/15/24 09:06  Temp    Pulse 94 07/15/24 09:06  Resp 26 07/15/24 09:06  SpO2 94 % 07/15/24 09:06    Last Pain:  Vitals:   07/15/24 0906  TempSrc:   PainSc: Asleep         Complications: No notable events documented.

## 2024-08-10 ENCOUNTER — Encounter: Payer: Self-pay | Admitting: Student in an Organized Health Care Education/Training Program

## 2024-08-10 DIAGNOSIS — M47816 Spondylosis without myelopathy or radiculopathy, lumbar region: Secondary | ICD-10-CM

## 2024-08-10 DIAGNOSIS — G894 Chronic pain syndrome: Secondary | ICD-10-CM

## 2024-08-22 ENCOUNTER — Encounter: Payer: Self-pay | Admitting: Student in an Organized Health Care Education/Training Program

## 2024-08-26 ENCOUNTER — Ambulatory Visit: Admitting: Student in an Organized Health Care Education/Training Program

## 2024-09-16 ENCOUNTER — Ambulatory Visit: Admitting: Student in an Organized Health Care Education/Training Program

## 2024-09-21 ENCOUNTER — Ambulatory Visit: Admitting: Dermatology

## 2024-10-27 ENCOUNTER — Ambulatory Visit: Admitting: Cardiology
